# Patient Record
Sex: Female | Born: 1950 | Race: White | Hispanic: No | Marital: Married | Smoking: Former smoker
Health system: Southern US, Community
[De-identification: ages and names within clinical notes are randomized; demographics above are authoritative.]

## PROBLEM LIST (undated history)

## (undated) DIAGNOSIS — E785 Hyperlipidemia, unspecified: Secondary | ICD-10-CM

## (undated) DIAGNOSIS — J45909 Unspecified asthma, uncomplicated: Secondary | ICD-10-CM

## (undated) DIAGNOSIS — R635 Abnormal weight gain: Secondary | ICD-10-CM

## (undated) DIAGNOSIS — S72009A Fracture of unspecified part of neck of unspecified femur, initial encounter for closed fracture: Secondary | ICD-10-CM

## (undated) DIAGNOSIS — I10 Essential (primary) hypertension: Secondary | ICD-10-CM

## (undated) DIAGNOSIS — K289 Gastrojejunal ulcer, unspecified as acute or chronic, without hemorrhage or perforation: Secondary | ICD-10-CM

## (undated) DIAGNOSIS — Z8719 Personal history of other diseases of the digestive system: Secondary | ICD-10-CM

## (undated) DIAGNOSIS — M810 Age-related osteoporosis without current pathological fracture: Secondary | ICD-10-CM

## (undated) DIAGNOSIS — F419 Anxiety disorder, unspecified: Secondary | ICD-10-CM

## (undated) DIAGNOSIS — K219 Gastro-esophageal reflux disease without esophagitis: Secondary | ICD-10-CM

## (undated) DIAGNOSIS — D509 Iron deficiency anemia, unspecified: Secondary | ICD-10-CM

## (undated) DIAGNOSIS — J189 Pneumonia, unspecified organism: Secondary | ICD-10-CM

## (undated) DIAGNOSIS — R51 Headache: Secondary | ICD-10-CM

## (undated) DIAGNOSIS — Z8679 Personal history of other diseases of the circulatory system: Secondary | ICD-10-CM

## (undated) DIAGNOSIS — H269 Unspecified cataract: Secondary | ICD-10-CM

## (undated) DIAGNOSIS — R011 Cardiac murmur, unspecified: Secondary | ICD-10-CM

## (undated) DIAGNOSIS — Z86010 Personal history of colonic polyps: Secondary | ICD-10-CM

## (undated) DIAGNOSIS — F329 Major depressive disorder, single episode, unspecified: Secondary | ICD-10-CM

## (undated) DIAGNOSIS — K449 Diaphragmatic hernia without obstruction or gangrene: Secondary | ICD-10-CM

## (undated) HISTORY — PX: COLONOSCOPY: SHX174

## (undated) HISTORY — DX: Pneumonia, unspecified organism: J18.9

## (undated) HISTORY — DX: Hyperlipidemia, unspecified: E78.5

## (undated) HISTORY — PX: EYE SURGERY: SHX253

## (undated) HISTORY — PX: FRACTURE SURGERY: SHX138

## (undated) HISTORY — DX: Iron deficiency anemia, unspecified: D50.9

## (undated) HISTORY — DX: Personal history of other diseases of the circulatory system: Z86.79

## (undated) HISTORY — DX: Personal history of colonic polyps: Z86.010

## (undated) HISTORY — DX: Fracture of unspecified part of neck of unspecified femur, initial encounter for closed fracture: S72.009A

## (undated) HISTORY — DX: Essential (primary) hypertension: I10

## (undated) HISTORY — PX: BACK SURGERY: SHX140

## (undated) HISTORY — PX: APPENDECTOMY: SHX54

## (undated) HISTORY — DX: Age-related osteoporosis without current pathological fracture: M81.0

## (undated) HISTORY — PX: SMALL INTESTINE SURGERY: SHX150

## (undated) HISTORY — PX: POLYPECTOMY: SHX149

## (undated) HISTORY — PX: SPINE SURGERY: SHX786

## (undated) HISTORY — DX: Headache: R51

## (undated) HISTORY — DX: Major depressive disorder, single episode, unspecified: F32.9

## (undated) HISTORY — DX: Diaphragmatic hernia without obstruction or gangrene: K44.9

## (undated) HISTORY — DX: Unspecified asthma, uncomplicated: J45.909

## (undated) HISTORY — DX: Unspecified cataract: H26.9

## (undated) HISTORY — DX: Gastrojejunal ulcer, unspecified as acute or chronic, without hemorrhage or perforation: K28.9

## (undated) HISTORY — DX: Anxiety disorder, unspecified: F41.9

## (undated) HISTORY — PX: AUGMENTATION MAMMAPLASTY: SUR837

## (undated) HISTORY — DX: Cardiac murmur, unspecified: R01.1

## (undated) HISTORY — PX: NECK SURGERY: SHX720

## (undated) HISTORY — DX: Abnormal weight gain: R63.5

---

## 1998-02-20 ENCOUNTER — Other Ambulatory Visit: Admission: RE | Admit: 1998-02-20 | Discharge: 1998-02-20 | Payer: Self-pay | Admitting: Gynecology

## 2001-03-09 ENCOUNTER — Encounter: Payer: Self-pay | Admitting: *Deleted

## 2001-03-09 ENCOUNTER — Encounter: Admission: RE | Admit: 2001-03-09 | Discharge: 2001-03-09 | Payer: Self-pay | Admitting: *Deleted

## 2001-03-23 ENCOUNTER — Encounter: Payer: Self-pay | Admitting: *Deleted

## 2001-03-23 ENCOUNTER — Encounter: Admission: RE | Admit: 2001-03-23 | Discharge: 2001-03-23 | Payer: Self-pay | Admitting: *Deleted

## 2001-05-04 HISTORY — PX: BACK SURGERY: SHX140

## 2001-05-25 ENCOUNTER — Encounter: Admission: RE | Admit: 2001-05-25 | Discharge: 2001-05-25 | Payer: Self-pay | Admitting: *Deleted

## 2001-05-25 ENCOUNTER — Encounter: Payer: Self-pay | Admitting: *Deleted

## 2001-06-04 HISTORY — PX: OTHER SURGICAL HISTORY: SHX169

## 2001-06-16 ENCOUNTER — Other Ambulatory Visit: Admission: RE | Admit: 2001-06-16 | Discharge: 2001-06-16 | Payer: Self-pay | Admitting: Gynecology

## 2001-06-17 ENCOUNTER — Encounter: Payer: Self-pay | Admitting: Emergency Medicine

## 2001-06-17 ENCOUNTER — Inpatient Hospital Stay (HOSPITAL_COMMUNITY): Admission: EM | Admit: 2001-06-17 | Discharge: 2001-06-21 | Payer: Self-pay | Admitting: Emergency Medicine

## 2001-06-17 ENCOUNTER — Encounter: Payer: Self-pay | Admitting: Orthopaedic Surgery

## 2001-06-20 ENCOUNTER — Encounter: Payer: Self-pay | Admitting: Orthopaedic Surgery

## 2001-08-29 ENCOUNTER — Encounter: Payer: Self-pay | Admitting: Neurosurgery

## 2001-08-31 ENCOUNTER — Encounter: Payer: Self-pay | Admitting: Neurosurgery

## 2001-08-31 ENCOUNTER — Inpatient Hospital Stay (HOSPITAL_COMMUNITY): Admission: RE | Admit: 2001-08-31 | Discharge: 2001-09-02 | Payer: Self-pay | Admitting: Neurosurgery

## 2002-06-26 ENCOUNTER — Other Ambulatory Visit: Admission: RE | Admit: 2002-06-26 | Discharge: 2002-06-26 | Payer: Self-pay | Admitting: Gynecology

## 2002-08-02 ENCOUNTER — Ambulatory Visit (HOSPITAL_COMMUNITY): Admission: RE | Admit: 2002-08-02 | Discharge: 2002-08-02 | Payer: Self-pay | Admitting: *Deleted

## 2002-08-08 ENCOUNTER — Encounter: Payer: Self-pay | Admitting: Neurosurgery

## 2002-08-08 ENCOUNTER — Inpatient Hospital Stay (HOSPITAL_COMMUNITY): Admission: RE | Admit: 2002-08-08 | Discharge: 2002-08-09 | Payer: Self-pay | Admitting: Neurosurgery

## 2002-10-30 ENCOUNTER — Encounter: Payer: Self-pay | Admitting: Gynecology

## 2002-10-30 ENCOUNTER — Encounter: Admission: RE | Admit: 2002-10-30 | Discharge: 2002-10-30 | Payer: Self-pay | Admitting: Gynecology

## 2003-06-28 ENCOUNTER — Other Ambulatory Visit: Admission: RE | Admit: 2003-06-28 | Discharge: 2003-06-28 | Payer: Self-pay | Admitting: Gynecology

## 2004-05-20 ENCOUNTER — Ambulatory Visit (HOSPITAL_COMMUNITY): Admission: RE | Admit: 2004-05-20 | Discharge: 2004-05-20 | Payer: Self-pay | Admitting: Gynecology

## 2004-06-30 ENCOUNTER — Other Ambulatory Visit: Admission: RE | Admit: 2004-06-30 | Discharge: 2004-06-30 | Payer: Self-pay | Admitting: Gynecology

## 2004-07-28 ENCOUNTER — Ambulatory Visit (HOSPITAL_COMMUNITY): Admission: RE | Admit: 2004-07-28 | Discharge: 2004-07-28 | Payer: Self-pay | Admitting: Gynecology

## 2004-07-28 ENCOUNTER — Encounter (INDEPENDENT_AMBULATORY_CARE_PROVIDER_SITE_OTHER): Payer: Self-pay | Admitting: *Deleted

## 2004-07-28 ENCOUNTER — Ambulatory Visit (HOSPITAL_BASED_OUTPATIENT_CLINIC_OR_DEPARTMENT_OTHER): Admission: RE | Admit: 2004-07-28 | Discharge: 2004-07-28 | Payer: Self-pay | Admitting: Gynecology

## 2005-02-11 ENCOUNTER — Encounter: Admission: RE | Admit: 2005-02-11 | Discharge: 2005-02-11 | Payer: Self-pay | Admitting: Internal Medicine

## 2005-06-26 ENCOUNTER — Ambulatory Visit (HOSPITAL_COMMUNITY): Admission: RE | Admit: 2005-06-26 | Discharge: 2005-06-26 | Payer: Self-pay | Admitting: Gynecology

## 2005-07-01 ENCOUNTER — Other Ambulatory Visit: Admission: RE | Admit: 2005-07-01 | Discharge: 2005-07-01 | Payer: Self-pay | Admitting: Gynecology

## 2006-07-05 ENCOUNTER — Ambulatory Visit (HOSPITAL_COMMUNITY): Admission: RE | Admit: 2006-07-05 | Discharge: 2006-07-05 | Payer: Self-pay | Admitting: Sports Medicine

## 2006-07-06 ENCOUNTER — Other Ambulatory Visit: Admission: RE | Admit: 2006-07-06 | Discharge: 2006-07-06 | Payer: Self-pay | Admitting: Gynecology

## 2006-11-02 ENCOUNTER — Encounter: Admission: RE | Admit: 2006-11-02 | Discharge: 2006-11-02 | Payer: Self-pay | Admitting: Neurosurgery

## 2006-12-01 ENCOUNTER — Ambulatory Visit (HOSPITAL_COMMUNITY): Admission: RE | Admit: 2006-12-01 | Discharge: 2006-12-03 | Payer: Self-pay | Admitting: Neurosurgery

## 2007-07-07 ENCOUNTER — Ambulatory Visit (HOSPITAL_COMMUNITY): Admission: RE | Admit: 2007-07-07 | Discharge: 2007-07-07 | Payer: Self-pay | Admitting: Gynecology

## 2007-07-15 ENCOUNTER — Encounter: Admission: RE | Admit: 2007-07-15 | Discharge: 2007-07-15 | Payer: Self-pay | Admitting: Gynecology

## 2008-07-11 ENCOUNTER — Ambulatory Visit (HOSPITAL_COMMUNITY): Admission: RE | Admit: 2008-07-11 | Discharge: 2008-07-11 | Payer: Self-pay | Admitting: Gynecology

## 2008-07-13 ENCOUNTER — Encounter: Payer: Self-pay | Admitting: Internal Medicine

## 2008-07-13 LAB — CONVERTED CEMR LAB
AST: 20 units/L
Basophils Relative: 0 %
CO2: 21 meq/L
Chloride: 102 meq/L
Cholesterol: 164 mg/dL
Eosinophils Relative: 3 %
Glucose, Bld: 88 mg/dL
Hemoglobin: 12.3 g/dL
Lymphocytes, automated: 27 %
Monocytes Relative: 7 %
Neutrophils Relative %: 63 %
RBC: 3.8 M/uL
Total Protein: 6.9 g/dL
WBC: 7.3 10*3/uL

## 2009-05-04 HISTORY — PX: COLONOSCOPY: SHX174

## 2009-07-02 LAB — CONVERTED CEMR LAB

## 2009-07-12 ENCOUNTER — Ambulatory Visit (HOSPITAL_COMMUNITY): Admission: RE | Admit: 2009-07-12 | Discharge: 2009-07-12 | Payer: Self-pay | Admitting: Gynecology

## 2009-07-12 LAB — HM MAMMOGRAPHY: HM Mammogram: NORMAL

## 2009-07-15 ENCOUNTER — Encounter: Payer: Self-pay | Admitting: Internal Medicine

## 2009-07-15 LAB — CONVERTED CEMR LAB
BUN: 16 mg/dL
CO2: 22 meq/L
Creatinine, Ser: 0.71 mg/dL
Eosinophils Relative: 3 %
Glucose, Bld: 94 mg/dL
HCT: 31.5 %
Hemoglobin: 31.5 g/dL
Hgb A1c MFr Bld: 5.7 %
Lymphocytes, automated: 22 %
MCV: 96.6 fL
Neutrophils Relative %: 66 %
RBC: 3.26 M/uL
Sodium: 140 meq/L
Total Protein: 6.5 g/dL

## 2009-10-02 HISTORY — PX: OTHER SURGICAL HISTORY: SHX169

## 2009-10-04 ENCOUNTER — Inpatient Hospital Stay (HOSPITAL_COMMUNITY): Admission: EM | Admit: 2009-10-04 | Discharge: 2009-10-07 | Payer: Self-pay | Admitting: Emergency Medicine

## 2009-10-04 ENCOUNTER — Encounter: Payer: Self-pay | Admitting: Internal Medicine

## 2009-10-04 LAB — CONVERTED CEMR LAB
BUN: 35 mg/dL
Basophils Relative: 1 %
Chloride: 103 meq/L
Creatinine, Ser: 0.8 mg/dL
Hemoglobin: 6.6 g/dL
Lymphocytes, automated: 19 %
Monocytes Relative: 11 %
RBC: 2.31 M/uL
Sodium: 137 meq/L
WBC: 7.4 10*3/uL

## 2009-10-05 ENCOUNTER — Ambulatory Visit: Payer: Self-pay | Admitting: Gastroenterology

## 2009-10-05 ENCOUNTER — Encounter: Payer: Self-pay | Admitting: Internal Medicine

## 2009-10-06 ENCOUNTER — Encounter (INDEPENDENT_AMBULATORY_CARE_PROVIDER_SITE_OTHER): Payer: Self-pay | Admitting: Internal Medicine

## 2009-10-06 ENCOUNTER — Encounter: Payer: Self-pay | Admitting: Internal Medicine

## 2009-10-06 ENCOUNTER — Encounter: Payer: Self-pay | Admitting: Gastroenterology

## 2009-10-07 ENCOUNTER — Telehealth (INDEPENDENT_AMBULATORY_CARE_PROVIDER_SITE_OTHER): Payer: Self-pay | Admitting: *Deleted

## 2009-10-07 ENCOUNTER — Encounter (INDEPENDENT_AMBULATORY_CARE_PROVIDER_SITE_OTHER): Payer: Self-pay | Admitting: *Deleted

## 2009-10-08 DIAGNOSIS — K449 Diaphragmatic hernia without obstruction or gangrene: Secondary | ICD-10-CM | POA: Insufficient documentation

## 2009-10-08 HISTORY — DX: Diaphragmatic hernia without obstruction or gangrene: K44.9

## 2009-10-14 ENCOUNTER — Ambulatory Visit (HOSPITAL_COMMUNITY): Admission: RE | Admit: 2009-10-14 | Discharge: 2009-10-14 | Payer: Self-pay | Admitting: Gastroenterology

## 2009-11-06 ENCOUNTER — Encounter (INDEPENDENT_AMBULATORY_CARE_PROVIDER_SITE_OTHER): Payer: Self-pay | Admitting: *Deleted

## 2009-11-06 ENCOUNTER — Ambulatory Visit: Payer: Self-pay | Admitting: Gastroenterology

## 2009-11-06 DIAGNOSIS — K289 Gastrojejunal ulcer, unspecified as acute or chronic, without hemorrhage or perforation: Secondary | ICD-10-CM | POA: Insufficient documentation

## 2009-11-06 HISTORY — DX: Gastrojejunal ulcer, unspecified as acute or chronic, without hemorrhage or perforation: K28.9

## 2009-11-07 LAB — CONVERTED CEMR LAB
Basophils Absolute: 0 10*3/uL (ref 0.0–0.1)
Basophils Relative: 0.5 % (ref 0.0–3.0)
Eosinophils Relative: 3.6 % (ref 0.0–5.0)
Lymphocytes Relative: 23.7 % (ref 12.0–46.0)
MCHC: 33.3 g/dL (ref 30.0–36.0)
Monocytes Relative: 8.5 % (ref 3.0–12.0)
Neutrophils Relative %: 63.7 % (ref 43.0–77.0)
WBC: 7.7 10*3/uL (ref 4.5–10.5)

## 2009-11-14 ENCOUNTER — Encounter: Payer: Self-pay | Admitting: Internal Medicine

## 2009-11-14 DIAGNOSIS — M810 Age-related osteoporosis without current pathological fracture: Secondary | ICD-10-CM

## 2009-11-14 DIAGNOSIS — F329 Major depressive disorder, single episode, unspecified: Secondary | ICD-10-CM

## 2009-11-14 DIAGNOSIS — D509 Iron deficiency anemia, unspecified: Secondary | ICD-10-CM | POA: Insufficient documentation

## 2009-11-14 DIAGNOSIS — I10 Essential (primary) hypertension: Secondary | ICD-10-CM | POA: Insufficient documentation

## 2009-11-14 DIAGNOSIS — F3289 Other specified depressive episodes: Secondary | ICD-10-CM

## 2009-11-14 DIAGNOSIS — E785 Hyperlipidemia, unspecified: Secondary | ICD-10-CM

## 2009-11-14 DIAGNOSIS — S72009A Fracture of unspecified part of neck of unspecified femur, initial encounter for closed fracture: Secondary | ICD-10-CM

## 2009-11-14 DIAGNOSIS — M8580 Other specified disorders of bone density and structure, unspecified site: Secondary | ICD-10-CM | POA: Insufficient documentation

## 2009-11-14 DIAGNOSIS — F419 Anxiety disorder, unspecified: Secondary | ICD-10-CM

## 2009-11-14 HISTORY — DX: Other specified depressive episodes: F32.89

## 2009-11-14 HISTORY — DX: Fracture of unspecified part of neck of unspecified femur, initial encounter for closed fracture: S72.009A

## 2009-11-14 HISTORY — DX: Essential (primary) hypertension: I10

## 2009-11-14 HISTORY — DX: Age-related osteoporosis without current pathological fracture: M81.0

## 2009-11-14 HISTORY — DX: Hyperlipidemia, unspecified: E78.5

## 2009-11-14 HISTORY — DX: Iron deficiency anemia, unspecified: D50.9

## 2009-11-14 HISTORY — DX: Major depressive disorder, single episode, unspecified: F32.9

## 2009-11-15 ENCOUNTER — Ambulatory Visit: Payer: Self-pay | Admitting: Internal Medicine

## 2009-11-15 DIAGNOSIS — Z860100 Personal history of colon polyps, unspecified: Secondary | ICD-10-CM | POA: Insufficient documentation

## 2009-11-15 DIAGNOSIS — R51 Headache: Secondary | ICD-10-CM

## 2009-11-15 DIAGNOSIS — Z8679 Personal history of other diseases of the circulatory system: Secondary | ICD-10-CM | POA: Insufficient documentation

## 2009-11-15 DIAGNOSIS — R519 Headache, unspecified: Secondary | ICD-10-CM | POA: Insufficient documentation

## 2009-11-15 HISTORY — DX: Headache: R51

## 2009-11-15 HISTORY — DX: Personal history of colon polyps, unspecified: Z86.0100

## 2009-11-15 HISTORY — DX: Personal history of colonic polyps: Z86.010

## 2009-11-15 HISTORY — DX: Personal history of other diseases of the circulatory system: Z86.79

## 2009-11-18 ENCOUNTER — Ambulatory Visit (HOSPITAL_COMMUNITY): Admission: RE | Admit: 2009-11-18 | Discharge: 2009-11-18 | Payer: Self-pay | Admitting: Internal Medicine

## 2009-11-21 ENCOUNTER — Encounter: Payer: Self-pay | Admitting: Internal Medicine

## 2009-11-29 ENCOUNTER — Ambulatory Visit: Payer: Self-pay | Admitting: Internal Medicine

## 2009-12-11 ENCOUNTER — Ambulatory Visit: Payer: Self-pay | Admitting: Gastroenterology

## 2009-12-11 ENCOUNTER — Encounter (INDEPENDENT_AMBULATORY_CARE_PROVIDER_SITE_OTHER): Payer: Self-pay | Admitting: *Deleted

## 2009-12-12 ENCOUNTER — Telehealth: Payer: Self-pay | Admitting: Internal Medicine

## 2009-12-30 ENCOUNTER — Telehealth: Payer: Self-pay | Admitting: Internal Medicine

## 2010-01-10 ENCOUNTER — Ambulatory Visit: Payer: Self-pay | Admitting: Internal Medicine

## 2010-01-10 DIAGNOSIS — R635 Abnormal weight gain: Secondary | ICD-10-CM

## 2010-01-10 HISTORY — DX: Abnormal weight gain: R63.5

## 2010-01-10 LAB — CONVERTED CEMR LAB
Basophils Relative: 0.3 % (ref 0.0–3.0)
Eosinophils Relative: 3.8 % (ref 0.0–5.0)
Lymphocytes Relative: 26 % (ref 12.0–46.0)
Monocytes Absolute: 0.6 10*3/uL (ref 0.1–1.0)
Monocytes Relative: 8.6 % (ref 3.0–12.0)
Neutro Abs: 4.2 10*3/uL (ref 1.4–7.7)
Platelets: 189 10*3/uL (ref 150.0–400.0)
TSH: 0.93 microintl units/mL (ref 0.35–5.50)

## 2010-01-15 ENCOUNTER — Telehealth (INDEPENDENT_AMBULATORY_CARE_PROVIDER_SITE_OTHER): Payer: Self-pay | Admitting: *Deleted

## 2010-01-30 ENCOUNTER — Encounter: Payer: Self-pay | Admitting: Internal Medicine

## 2010-02-10 ENCOUNTER — Telehealth: Payer: Self-pay | Admitting: Internal Medicine

## 2010-03-07 ENCOUNTER — Encounter: Payer: Self-pay | Admitting: Internal Medicine

## 2010-05-24 ENCOUNTER — Encounter: Payer: Self-pay | Admitting: Gynecology

## 2010-06-03 NOTE — Progress Notes (Signed)
Summary: ALT med/VAL pt  Phone Note Call from Patient Call back at [facility] Phone [phone] Call back at Work Phone [phone]   Caller: Patient Summary of Call: Pt called stating that Topamax Rxd by VAL for migraine HA is not helping at all. Pt is requesting alternate medication. Initial call taken by: Margaret Pyle, CMA,  December 12, 2009 1:35 PM  Follow-up for Phone Call        there is no similar medication substitution close to the topamax itself;  I would feel uncomfortable prescribing a different class of med at this point;    please inform pt, and let her know I would ask Dr L to address when back in the office tomorrow Follow-up by: Corwin Levins MD,  December 12, 2009 2:28 PM  Additional Follow-up for Phone Call Additional follow up Details #1::        pt informed. Margaret Pyle, CMA  December 12, 2009 4:03 PM   will add amitriptyline to current meds (continue topamax 50mg /d and other meds) - titrate as instructed - ROV 2-3 weeks - Newt Lukes MD  December 12, 2009 5:04 PM     Additional Follow-up for Phone Call Additional follow up Details #2::    Pt states that Topamax has side effect also, depression and lethargy. Pt is now requesting to taken off Topamax and Rx's an alternative Follow-up by: Margaret Pyle, CMA,  December 13, 2009 8:12 AM  Additional Follow-up for Phone Call Additional follow up Details #3:: Details for Additional Follow-up Action Taken: stop topamax - if taking 25mg  two times a day, should reduce to 25 mg once daily for 7 days before stopping to wean off med) - start amitriptiline to help with sleep and anxiety (dose as per prior instructions) in addition to other meds and [facility] eval and tx of her headache Newt Lukes MD  December 13, 2009 8:23 AM   Pt informed and will expect call from [facility] with appt info Additional Follow-up by: Margaret Pyle, CMA,  December 13, 2009 9:37 AM  New/Updated Medications: TOPAMAX 25 MG TABS (TOPIRAMATE) 1 by mouth two times a day  (or as directed) AMITRIPTYLINE HCL 25 MG TABS (AMITRIPTYLINE HCL) 1/2 by mouth at bedtime x 7 days, then increase to 1 by mouth at bedtime (or as directed) Prescriptions: AMITRIPTYLINE HCL 25 MG TABS (AMITRIPTYLINE HCL) 1/2 by mouth at bedtime x 7 days, then increase to 1 by mouth at bedtime (or as directed)  #30 x 2   Entered and Authorized by:   Newt Lukes MD   Signed by:   Newt Lukes MD on 12/12/2009   Method used:   Electronically to        [facility]  [phone]* (retail)       [address] [place], [place]  [address]       Ph: [phone] or [phone]       Fax: [phone]   RxID:   [id number]

## 2010-06-03 NOTE — Progress Notes (Signed)
Summary: Referral/ Oxycodone  Phone Note Call from Patient Call back at Home Phone [phone] Call back at Work Phone [phone]   Caller: Patient Summary of Call: Pt called requesting refill of Oxycodone and referral to Dr Trey Sailors for HA eval? Initial call taken by: Margaret Pyle, CMA,  February 10, 2010 1:35 PM  Follow-up for Phone Call        neuro (headache specialist) consult recommended against use of narcotic (oxy) as this can make headache worse over time - i will give only #30 and ask pt to use them sparingly - referral to dr. 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 Rx in cabinet for pt pickup. Additional Follow-up by: Margaret Pyle, CMA,  February 10, 2010 1:58 PM    Additional Follow-up for Phone Call Additional follow up Details #2::    noted - thanks Follow-up by: Newt Lukes MD,  February 10, 2010 2:16 PM  Prescriptions: OXYCODONE-ACETAMINOPHEN 5-325 MG TABS (OXYCODONE-ACETAMINOPHEN) 1-2 by mouth every 6 hours as needed for severe pain  #30 x 0   Entered and Authorized by:   Newt Lukes MD   Signed by:   Newt Lukes MD on 02/10/2010   Method used:   Print then Give to Patient   RxID:   [id number]

## 2010-06-03 NOTE — Assessment & Plan Note (Signed)
Summary: 2-3 wk fu---stc   Vital Signs:  Patient profile:   60 year old female Height:      62 inches (157.48 cm) Weight:      144 pounds (65.45 kg) O2 Sat:      97 % on Room air Temp:     98.6 degrees F (37.00 degrees C) oral Pulse rate:   94 / minute BP sitting:   110 / 62  (left arm) Cuff size:   regular  Vitals Entered By: Orlan Leavens RMA (November 29, 2009 10:12 AM)  O2 Flow:  Room air CC: 3 week follow-up Is Patient Diabetic? No Pain Assessment Patient in pain? no        Primary Care Provider:  Rene Paci, MD  CC:  3 week follow-up.  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  The symptoms began >1 year ago.  On a scale of 1 to 10, the intensity is described as a 5.  near daily symptoms - occurs more days than not each week, sometimes daily. present since teenage years, maybe longer - prev tx self with  OTC advil 6/d but recent hosp related to NSAID induced ulcer/gastrtis and assoc iron-def anemia. since off advil, HA symptoms worse. good relief of HA in hosp with percocet, prior tx with tylenol and trmadol ineffective.  The patient denies nausea, vomiting, sweats, tearing of eyes, nasal congestion, sinus pain, sinus pressure, photophobia, and phonophobia.  The headache is described as intermittent and band-like.  The location of the pain is bitemporal.  High-risk features (red flags) include pain worse with exertion and age >50 years.  The patient denies the following high-risk features: fever, neck pain/stiffness, vision loss or change, focal weakness, altered mental status, rash, trauma, new type of headache, immunosuppression, concomitant infection, and  anticoagulation use.  The headaches are precipitated by stress.  Prior treatment has included a NSAID and a narcotic.    Clinical Review Panels:  Lipid Management   Cholesterol:  165 (07/15/2009)   LDL (bad choesterol):  82 (07/15/2009)   HDL (good cholesterol):  72 (07/15/2009)   Triglycerides:  58 (07/15/2009)  Diabetes Management   HgBA1C:  5.7 (07/15/2009)   Creatinine:  0.80 (10/04/2009)  CBC   WBC:  7.7 (11/06/2009)   RBC:  3.43 (11/06/2009)   Hgb:  10.4 (11/06/2009)   Hct:  31.3 (11/06/2009)   Platelets:  210.0 (11/06/2009)   MCV  91.2 (11/06/2009)   MCHC  33.3 (11/06/2009)   RDW  17.8 (11/06/2009)   PMN:  63.7 (11/06/2009)   Lymphs:  23.7 (11/06/2009)   Monos:  8.5 (11/06/2009)   Eosinophils:  3.6 (11/06/2009)   Basophil:  0.5 (11/06/2009)  Complete Metabolic Panel   Glucose:  103 (10/04/2009)   Sodium:  137 (10/04/2009)   Potassium:  3.9 (10/04/2009)   Chloride:  103 (10/04/2009)   CO2:  28 (10/04/2009)   BUN:  35 (10/04/2009)   Creatinine:  0.80 (10/04/2009)   Albumin:  4.4 (07/15/2009)   Total Protein:  6.5 (07/15/2009)   Calcium:  9.4 (07/15/2009)   Total Bili:  0.2 (07/15/2009)   Alk Phos:  56 (07/15/2009)  SGPT (ALT):  19 (07/15/2009)   SGOT (AST):  24 (07/15/2009)   Current Medications (verified): 1)  Nexium 40 Mg Cpdr (Esomeprazole Magnesium) .... Take One Pill Once A Day 2)  Lisinopril 10 Mg Tabs (Lisinopril) .... Take 1 By Mouth Once Daily 3)  Ferrous Sulfate 325 (65 Fe) Mg Tabs (Ferrous Sulfate) .... Once Daily 4)  Caltrate 600+d 600-400 Mg-Unit Tabs (Calcium Carbonate-Vitamin D) .... Once Daily 5)  Crestor 10 Mg Tabs (Rosuvastatin Calcium) .... Take 1/2 Tablet Daily 6)  Boniva 150 Mg Tabs (Ibandronate Sodium) .... Once Monthly 7)  Vivelle-Dot 0.05 Mg/24hr Pttw (Estradiol) .... Use 1/2 of Patch As Dircted 8)  Trazodone Hcl 50 Mg Tabs (Trazodone Hcl) .... Take 1 At Bedtime 9)  Cymbalta 60 Mg Cpep (Duloxetine Hcl) .... Take 1 By Mouth Once  Daily 10)  Metformin Hcl 500 Mg Tabs (Metformin Hcl) .... Take 3 By Mouth Once Daily 11)  Ultracet 37.5-325 Mg Tabs (Tramadol-Acetaminophen) .Marland Kitchen.. 1-2 By Mouth Every 6 Hours As Needed For Moderate Pain 12)  Oxycodone-Acetaminophen 5-325 Mg Tabs (Oxycodone-Acetaminophen) .Marland Kitchen.. 1-2 By Mouth Every 6 Hours As Needed For Severe Pain  Allergies (verified): No Known Drug Allergies  Past History:  Past Medical History: Anemia-iron deficiency Depression  Hyperlipidemia Hypertension  Osteoporosis Colonic polyps, hx of glucose intolerance:  a1c 5.7 07/2009  MD roster: gyn - lomax GI - jacobs ortho - daldorf Nsurg - roy  Review of Systems  The patient denies vision loss, chest pain, abdominal pain, suspicious skin lesions, and enlarged lymph nodes.    Physical Exam  General:  alert, well-developed, well-nourished, and cooperative to examination.    Neurologic:  alert & oriented X3 and cranial nerves II-XII symetrically intact.  strength normal in all extremities, sensation intact to light touch, and gait normal. speech fluent without dysarthria or aphasia; follows commands with good comprehension.  Psych:  Oriented X3, memory intact for recent and remote, normally interactive, very stoic, good eye contact, not anxious appearing, not depressed appearing, and not agitated.      Impression & Recommendations:  Problem # 1:  HEADACHE (ICD-784.0)  The following medications were removed from the medication list:    Ultracet 37.5-325 Mg Tabs (Tramadol-acetaminophen) .Marland Kitchen... 1-2 by mouth every 6 hours as needed for moderate pain Her updated medication list for this problem includes:    Oxycodone-acetaminophen 5-325 Mg Tabs (Oxycodone-acetaminophen) .Marland Kitchen... 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 consider refer for injections if med tx ineffective, ?role for biofeedback given tremedous stress induced component (worse since assuming care of mom 03/2009) hosp 10/2009 related to GIB reviewed including tests - continue breakthru efforts to control pain with percocet if severe until topamax titration achieved- new rx provided  Orders: Prescription Created Electronically [phone])  Complete Medication List: 1)  Nexium 40 Mg Cpdr (Esomeprazole magnesium) .... Take one pill once a day 2)  Lisinopril 10 Mg Tabs (Lisinopril) .... Take 1 by mouth once daily 3)  Ferrous Sulfate 325 (65 Fe) Mg Tabs (Ferrous  sulfate) .... Once daily 4)  Caltrate 600+d 600-400 Mg-unit Tabs (Calcium carbonate-vitamin d) .... Once daily 5)  Crestor 10 Mg Tabs (Rosuvastatin calcium) .... Take 1/2 tablet daily 6)  Boniva 150 Mg Tabs (Ibandronate sodium) .... Once monthly 7)  Vivelle-dot 0.05 Mg/24hr Pttw (Estradiol) .... Use 1/2 of patch as dircted 8)  Trazodone Hcl 50 Mg Tabs (Trazodone hcl) .... Take 1 at bedtime 9)  Cymbalta 60 Mg Cpep (Duloxetine hcl) .... Take 1 by mouth once daily 10)  Metformin Hcl 500 Mg Tabs (Metformin hcl) .... Take 3 by mouth once daily 11)  Oxycodone-acetaminophen 5-325 Mg Tabs (Oxycodone-acetaminophen) .Marland Kitchen.. 1-2 by mouth every 6 hours as needed for severe pain 12)  Topamax 25 Mg Tabs (Topiramate) .Marland Kitchen.. 1 by mouth at bedtime x 7 days, then increase to 1 by mouth two times a day  (or as directed)  Patient Instructions: 1)  it was good to see you today.  2)  MRI brain results reviewed - small pitutary cyst present but this is not the cause of your headache symptoms and we will not plan  other evaluation of this finding at this time (benign finding) 3)  start Topamax for headache symptoms: your prescriptions have been electronically submitted to your pharmacy. Please take as directed. Contact our office if you believe you're having problems with the medication(s).  4)  Please schedule a follow-up appointment in 4-6 weeks to review symptoms and medications, call sooner if problems.  Prescriptions: OXYCODONE-ACETAMINOPHEN 5-325 MG TABS (OXYCODONE-ACETAMINOPHEN) 1-2 by mouth every 6 hours as needed for severe pain  #30 x 0   Entered and Authorized by:   Newt Lukes MD   Signed by:   Newt Lukes MD on 11/29/2009   Method used:   Print then Give to Patient   RxID:   [id number] TOPAMAX 25 MG TABS (TOPIRAMATE) 1 by mouth at bedtime x 7 days, then increase to 1 by mouth two times a day  (or as directed)  #60 x 1   Entered and Authorized by:   Newt Lukes MD   Signed by:   Newt Lukes MD on 11/29/2009   Method used:   [facility] to        [facility]  [phone][facility] (retail)       [address] [place], [place]  [address]       Ph: [phone] or [phone]       Fax: [phone]   RxID:   [id number]

## 2010-06-03 NOTE — Letter (Signed)
Summary: Appt Reminder 2  [facility]  [address] [place], [place] [address]   Phone: [phone]  Fax: [phone]        December 11, 2009 MRN: [id number]    [facility] [address] [place], [place]  [address]    Dear [name],   You have a return appointment with Dr. Christella Hartigan on 01/15/10 at 3:45pm. Please arrive at our [facility] lab on the basement level for labs at 1:45 pm before your appointment. Please remember to bring a complete list of the medicines you are taking, your insurance card and your co-pay.  If you have to cancel or reschedule this appointment, please call before 5:00 pm the evening before to avoid a cancellation fee.  If you have any questions or concerns, please call [phone].    Sincerely,    Chales Abrahams CMA (AAMA)  Appended Document: Appt Reminder 2 letter mailed

## 2010-06-03 NOTE — Procedures (Signed)
Summary: Upper Endoscopy  Patient: Talana Lysne Note: All result statuses are Final unless otherwise noted.  Tests: (1) Upper Endoscopy (EGD)   EGD Upper Endoscopy       DONE     [facility]     [address]     [place], [place]  [address]           ENDOSCOPY PROCEDURE REPORT           PATIENT:  Jerelyn, Trimarco  MR#:  [id number]     BIRTHDATE:  Sep 08, 1950, 59 yrs. old  GENDER:  female     ENDOSCOPIST:  Rachael Fee, MD     PROCEDURE DATE:  12/11/2009     PROCEDURE:  EGD, diagnostic     ASA CLASS:  Class II     INDICATIONS:  previous gastric ulcers (NSAID related +/- Cameron     erosions related to fairly large HH)     MEDICATIONS:  Fentanyl 50 mcg IV, Versed 7 mg IV     TOPICAL ANESTHETIC:  none           DESCRIPTION OF PROCEDURE:   After the risks benefits and     alternatives of the procedure were thoroughly explained, informed     consent was obtained.  The [facility] [id number] endoscope was     introduced through the mouth and advanced to the second portion of     the duodenum, without limitations.  The instrument was slowly     withdrawn as the mucosa was fully examined.           <<PROCEDUREIMAGES>>           There was a medium to large hiatal hernia. There were several     thick, linear erosions along the lip of the hernia. The previous,     deeper proximal gastric ulcer has resolved. The previously noted     multiple distal gastric ulcers have resolved. There was a small     amount of retained solid food in stomach (see image1, image2,     image3, image7, and image6).  Otherwise the examination was normal     (see image5 and image4).    Retroflexed views revealed no     abnormalities.    The scope was then withdrawn from the patient     and the procedure completed.           COMPLICATIONS:  None           ENDOSCOPIC IMPRESSION:     1) Moderate to large hiatal hernia with several Cameron's     erosions associated     2) The previously noted  proximal and distal gastric ulcers have     resolved (she stopped NSAIDs)     2) Otherwise normal examination           RECOMMENDATIONS:     Dr. Christella Hartigan' office will arrange return appointment in 4-5 weeks,     CBC a day or to prior.     Will need to determine if she will need surgical referral to     repair the hiatal hernia (for chronic anemia or for symptoms).     Stay on once daily iron supplement.     Can back down to once daily nexium (20-30 min prior to breakfast     or dinner meals).           ______________________________     Rachael Fee, MD  n.     eSIGNED:   Rachael Fee at 12/11/2009 10:35 AM           Ladell Heads, [id number]  Note: An exclamation mark (!) indicates a result that was not dispersed into the flowsheet. Document Creation Date: 12/11/2009 10:36 AM _______________________________________________________________________  (1) Order result status: Final Collection or observation date-time: 12/11/2009 10:25 Requested date-time:  Receipt date-time:  Reported date-time:  Referring Physician:   Ordering Physician: Rob Bunting [phone]) Specimen Source:  Source: Launa Grill Order Number: [id number] Lab site:   Appended Document: Upper Endoscopy ROV scheduled and labs in IDX

## 2010-06-03 NOTE — Assessment & Plan Note (Signed)
Summary: NEW [employer] PT-PKG--#--STC   Vital Signs:  Patient profile:   59 year old female Height:      62 inches (157.48 cm) Weight:      144.8 pounds (65.82 kg) O2 Sat:      98 % on Room air Temp:     98.6 degrees F (37.00 degrees C) oral Pulse rate:   82 / minute BP sitting:   98 / 72  (left arm) Cuff size:   regular  Vitals Entered By: Orlan Leavens (November 15, 2009 11:25 AM)  O2 Flow:  Room air CC: New patient, Hypertension Management, Headaches Is Patient Diabetic? No Pain Assessment Patient in pain? no        Primary Care Provider:  Rene Paci, MD  CC:  New patient, Hypertension Management, and Headaches.  History of Present Illness: new pt to me, here to est care -  has followed with dr. c lomax for all med needs until now - will cont to follow there for gyn needs  Headaches      This is a 60 year old woman who presents with Headaches.  The symptoms began >1 year ago.  On a scale of 1 to 10, the intensity is described as a 5.  near daily symptoms - occurs more days than not each week, sometimes daily. present since teenage years, maybe longer - prev tx self with  OTC advil 6/d but recent hosp related to NSAID induced ulcer/gastrtis and assoc iron-def anemia. since off advil, HA symptoms worse. good relief of HA in hosp with percocet, prior tx with tylenol and trmadol ineffective.  The patient denies nausea, vomiting, sweats, tearing of eyes, nasal congestion, sinus pain, sinus pressure, photophobia, and phonophobia.  The headache is described as intermittent and band-like.  The location of the pain is bitemporal.  High-risk features (red flags) include pain worse with exertion and age >50 years.  The patient denies the following high-risk features: fever, neck pain/stiffness, vision loss or change, focal weakness, altered mental status, rash, trauma, new type of headache, immunosuppression, concomitant infection, and anticoagulation use.  The headaches are precipitated by  stress.  Prior treatment has included a NSAID and a narcotic.    Hypertension History:      Positive major cardiovascular risk factors include female age 31 years old or older, hyperlipidemia, and hypertension.  Negative major cardiovascular risk factors include non-tobacco-user status.     Preventive Screening-Counseling & Management  Alcohol-Tobacco     Alcohol drinks/day: <1     Alcohol Counseling: not indicated; patient does not drink     Smoking Status: quit  Caffeine-Diet-Exercise     Caffeine use/day: 0     Caffeine Counseling: not indicated; caffeine use is not excessive or problematic     Does Patient Exercise: yes     Exercise Counseling: not indicated; exercise is adequate     Depression Counseling: not indicated; screening negative for depression  Safety-Violence-Falls     Seat Belt Counseling: not indicated; patient wears seat belts     Helmet Counseling: not indicated; patient wears helmet when riding bicycle/motocycle     Firearms in the Home: no firearms in the home     Firearm Counseling: not applicable     Smoke Detectors: yes     Violence in the Home: no risk noted     Fall Risk Counseling: not indicated; no significant falls noted  Clinical Review Panels:  Prevention   Last Mammogram:  No specific mammographic evidence of  malignancy.  Assessment: BIRADS 1. (07/12/2009)   Last Pap Smear:  Interpretation Result:Negative for intraepithelial Lesion or Malignancy.    (07/02/2009)   Last Colonoscopy:  Location:  [facility].  Impression: Showed 2 small polyps both removed, one was retrieved. The polyp that was not retrieved is clearly adenomatous, rectal polypectomy. Mild diverticulosis in the sigmoid colon to descending colon segment. (10/06/2009)  CBC   WBC:  7.7 (11/06/2009)   RBC:  3.43 (11/06/2009)   Hgb:  10.4 (11/06/2009)   Hct:  31.3 (11/06/2009)   Platelets:  210.0 (11/06/2009)   MCV  91.2 (11/06/2009)   MCHC  33.3 (11/06/2009)   RDW   17.8 (11/06/2009)   PMN:  63.7 (11/06/2009)   Lymphs:  23.7 (11/06/2009)   Monos:  8.5 (11/06/2009)   Eosinophils:  3.6 (11/06/2009)   Basophil:  0.5 (11/06/2009)  Complete Metabolic Panel   Glucose:  103 (10/04/2009)   Sodium:  137 (10/04/2009)   Potassium:  3.9 (10/04/2009)   Chloride:  103 (10/04/2009)   CO2:  28 (10/04/2009)   BUN:  35 (10/04/2009)   Creatinine:  0.80 (10/04/2009)   Current Medications (verified): 1)  Nexium 40 Mg Cpdr (Esomeprazole Magnesium) .... Take One Pill Once A Day 2)  Lisinopril 10 Mg Tabs (Lisinopril) .... Take 1 By Mouth Once Daily 3)  Ferrous Sulfate 325 (65 Fe) Mg Tabs (Ferrous Sulfate) .... Once Daily 4)  Caltrate 600+d 600-400 Mg-Unit Tabs (Calcium Carbonate-Vitamin D) .... Once Daily 5)  Crestor 10 Mg Tabs (Rosuvastatin Calcium) .... Take 1/2 Tablet Daily 6)  Boniva 150 Mg Tabs (Ibandronate Sodium) .... Once Monthly 7)  Vivelle-Dot 0.05 Mg/24hr Pttw (Estradiol) .... Use 1/2 of Patch As Dircted 8)  Trazodone Hcl 50 Mg Tabs (Trazodone Hcl) .... Take 1 At Bedtime 9)  Cymbalta 60 Mg Cpep (Duloxetine Hcl) .... Take 1 By Mouth Once Daily 10)  Metformin Hcl 500 Mg Tabs (Metformin Hcl) .... 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 I have reviewed all other systems and they were negative.   Physical Exam  General:  alert, well-developed, well-nourished, and cooperative to examination.    Head:  Normocephalic and atraumatic without obvious abnormalities. No apparent alopecia or balding. no sinus pain to pressure/palp Eyes:  vision grossly intact; pupils equal, round and reactive to light.  conjunctiva and lids normal.    Ears:  normal pinnae bilaterally, without erythema, swelling, or tenderness to palpation. L TM clear, without effusion, or cerumen impaction.R TM with mod cerumen obstrustion but no inflammation signs. Hearing grossly normal bilaterally  Mouth:  teeth and gums in good repair; mucous membranes moist, without lesions or ulcers. oropharynx clear without exudate, no erythema.  Neck:  supple, full ROM,  no masses, no thyromegaly; no thyroid nodules or tenderness. no JVD or carotid bruits.   Lungs:  normal respiratory effort, no intercostal retractions or use of accessory muscles; normal breath sounds bilaterally - no crackles and no wheezes.    Heart:  normal rate, regular rhythm, no murmur, and no rub. BLE without edema. normal DP pulses and normal cap refill in all 4 extremities    Abdomen:  soft, non-tender, normal bowel sounds, no distention; no masses and no appreciable hepatomegaly or splenomegaly.   Genitalia:  defer gyn Msk:  No deformity or scoliosis noted of thoracic or lumbar spine.   Neurologic:  alert & oriented X3 and cranial nerves II-XII symetrically intact.  strength normal in all extremities, sensation intact to light touch, and gait normal. speech fluent without dysarthria or aphasia; follows commands with good comprehension.  Skin:  no rashes, vesicles, ulcers, or erythema. No nodules or irregularity to palpation.  Psych:  Oriented X3, memory intact for recent and remote, normally interactive, good eye contact, not anxious  appearing, not depressed appearing, and not agitated.      Impression & Recommendations:  Problem # 1:  HEADACHE (ICD-784.0) chronic symptoms - tension type by hx and exam - arrange for MRI brain as never done - already on SNRI >4 yr w/o improvement in pain symptoms - consider elavil 10 or topamax 25 if imaging normal -  send for prior labs to review and exclude other med illness causing symptoms - pt with recent complications related to NSAID overuse (GIB, anemia) and will not use same again -note rebound likely complicating current pain. consider refer for injections if med tx ineffective, ?role for biofeedback given tremedous stress induced component (worse since assuming care of mom 03/2009) hosp 10/2009 related to GIB reviewed including tests - for now, control pain with ultracet or percocet if severe until imagaing complete - rx provided Time spent with patient 45 minutes, more than 50% of this time was spent counseling patient on chronic headaches, past tx, recent hosp and need for prior labs/records Her updated medication list for this problem includes:    Ultracet 37.5-325 Mg Tabs (Tramadol-acetaminophen) .Marland Kitchen... 1-2 by mouth every 6 hours as needed for moderate pain    Oxycodone-acetaminophen 5-325 Mg Tabs (Oxycodone-acetaminophen) .Marland Kitchen... 1-2 by mouth every 6 hours as needed for severe pain  Orders: Misc. Referral (Misc. Ref)  Problem # 2:  DEPRESSION (ICD-311) prev trial wellbutrin, pristique and prozac ineffective - on current tx >4y and symptoms well controlled The following medications were removed from the medication list:    Cymbalta 20 Mg Cpep (Duloxetine hcl) ..... Once daily Her updated medication list for this problem includes:    Trazodone Hcl 50 Mg Tabs (Trazodone hcl) .Marland Kitchen... Take 1 at bedtime    Cymbalta 60 Mg Cpep (Duloxetine hcl) .Marland Kitchen... Take 1 by mouth once daily  Problem # 3:  HYPERTENSION (ICD-401.9)  Her updated medication list for this problem includes:     Lisinopril 10 Mg Tabs (Lisinopril) .Marland Kitchen... Take 1 by mouth once daily  BP today: 98/72 Prior BP: 110/70 (11/06/2009)  10 Yr Risk Heart Disease: Not enough information  Labs Reviewed: K+: 3.9 (10/04/2009) Creat: : 0.80 (10/04/2009)     Problem # 4:  OSTEOPOROSIS (ICD-733.00) hx hip fx 06/2001 related to same - send for last dexa Her updated medication list for this problem includes:    Boniva 150 Mg Tabs (Ibandronate sodium) ..... Once monthly  Problem # 5:  HYPERLIPIDEMIA (ICD-272.4)  Her updated  medication list for this problem includes:    Crestor 10 Mg Tabs (Rosuvastatin calcium) .Marland Kitchen... Take 1/2 tablet daily  10 Yr Risk Heart Disease: Not enough information  Problem # 6:  ANEMIA-IRON DEFICIENCY (ICD-280.9)  Her updated medication list for this problem includes:    Ferrous Sulfate 325 (65 Fe) Mg Tabs (Ferrous sulfate) ..... Once daily  Hgb: 10.4 (11/06/2009)   Hct: 31.3 (11/06/2009)   Platelets: 210.0 (11/06/2009) RBC: 3.43 (11/06/2009)   RDW: 17.8 (11/06/2009)   WBC: 7.7 (11/06/2009) MCV: 91.2 (11/06/2009)   MCHC: 33.3 (11/06/2009) TSH: 1.388 (10/06/2009)  Problem # 7:  GASTROJEJ ULCR UNS ACUT/CHRN W/O HEMOR PERF/OBST (ICD-534.90)  NSAID induced - hosp 10/2009 -  mgmt per GI as ongoing Her updated medication list for this problem includes:    Nexium 40 Mg Cpdr (Esomeprazole magnesium) .Marland Kitchen... Take one pill once a day  EGD: DONE (10/06/2009)  Labs Reviewed: Hgb: 10.4 (11/06/2009)   Hct: 31.3 (11/06/2009)    ?DM - cont metfrmin and review labs from gyn when metformin prescribed earlier this year -- consider labs next OV if due   Complete Medication List: 1)  Nexium 40 Mg Cpdr (Esomeprazole magnesium) .... Take one pill once a day 2)  Lisinopril 10 Mg Tabs (Lisinopril) .... Take 1 by mouth once daily 3)  Ferrous Sulfate 325 (65 Fe) Mg Tabs (Ferrous sulfate) .... Once daily 4)  Caltrate 600+d 600-400 Mg-unit Tabs (Calcium carbonate-vitamin d) .... Once daily 5)  Crestor  10 Mg Tabs (Rosuvastatin calcium) .... Take 1/2 tablet daily 6)  Boniva 150 Mg Tabs (Ibandronate sodium) .... Once monthly 7)  Vivelle-dot 0.05 Mg/24hr Pttw (Estradiol) .... Use 1/2 of patch as dircted 8)  Trazodone Hcl 50 Mg Tabs (Trazodone hcl) .... Take 1 at bedtime 9)  Cymbalta 60 Mg Cpep (Duloxetine hcl) .... Take 1 by mouth once daily 10)  Metformin Hcl 500 Mg Tabs (Metformin hcl) .... Take 3 by mouth once daily 11)  Ultracet 37.5-325 Mg Tabs (Tramadol-acetaminophen) .Marland Kitchen.. 1-2 by mouth every 6 hours as needed for moderate pain 12)  Oxycodone-acetaminophen 5-325 Mg Tabs (Oxycodone-acetaminophen) .Marland Kitchen.. 1-2 by mouth every 6 hours as needed for severe pain  Hypertension Assessment/Plan:      The patient's hypertensive risk group is category B: At least one risk factor (excluding diabetes) with no target organ damage.  Today's blood pressure is 98/72.    Patient Instructions: 1)  it was good to see you today. 2)  we'll make referral to MRI brain to look for causes of headaches. Our office will contact you regarding this appointment once made.  3)  continue tylenol or ultracet or percocet as needed for headaches depending on severity as discussed - 4)  will send for records from dr. Nicholas Lose to review - 5)  Please schedule a follow-up appointment in 2-3 weeks to continue review, call sooner if problems.  Prescriptions: OXYCODONE-ACETAMINOPHEN 5-325 MG TABS (OXYCODONE-ACETAMINOPHEN) 1-2 by mouth every 6 hours as needed for severe pain  #30 x 0   Entered and Authorized by:   Newt Lukes MD   Signed by:   Newt Lukes MD on 11/15/2009   Method used:   Print then Give to Patient   RxID:   [id number] ULTRACET 37.5-325 MG TABS (TRAMADOL-ACETAMINOPHEN) 1-2 by mouth every 6 hours as needed for moderate pain  #40 x 1   Entered and Authorized by:   Newt Lukes MD   Signed by:   Newt Lukes MD on 11/15/2009  Method used:   Print then Give to Patient   RxID:    [id number]    Pap Smear  Procedure date:  07/02/2009  Findings:      Interpretation Result:Negative for intraepithelial Lesion or Malignancy.

## 2010-06-03 NOTE — Progress Notes (Signed)
Summary: Schedule procedures  Phone Note Other Incoming   Caller: Dr Christella Hartigan Summary of Call: Dr Christella Hartigan called and wants pt to have Barium esophagram and Upper GI ( characterize hiatal hernia) recall EGD in 2 months.  ROV in 4-5 weeks. Initial call taken by: Chales Abrahams CMA Duncan Dull),  October 07, 2009 10:47 AM  Follow-up for Phone Call        procedures and ROV scheduled left message on machine to call back  Chales Abrahams CMA Duncan Dull)  October 08, 2009 9:43 AM   Pt aware of procedures, ROV, and Recall for EGD in EMR.  Pt also asked that I schedule her an appt with Dr Windell Moulding.  Her appt is 11/15/09 2 pm pt aware of no show fee and arrive 15 minutes early Follow-up by: Chales Abrahams CMA Duncan Dull),  October 08, 2009 3:37 PM  New Problems: HIATAL HERNIA (ICD-553.3)   New Problems: HIATAL HERNIA (ICD-553.3)

## 2010-06-03 NOTE — Progress Notes (Signed)
Summary: Oxycodone  Phone Note Call from Patient Call back at Home Phone [phone] Call back at Work Phone [phone]   Caller: Patient Summary of Call: pt called requesting refill of Oxycodone Initial call taken by: Margaret Pyle, CMA,  December 30, 2009 10:44 AM  Follow-up for Phone Call        ok to give refill as prev rx'd Follow-up by: Newt Lukes MD,  December 30, 2009 10:50 AM  Additional Follow-up for Phone Call Additional follow up Details #1::        Pt informed, Rx sign and in cabinet for pick up.  Pt wanted MD to be aware that she has been taking 2 tabs of Oxycodone every am and it helps with HA pain for the entire day with no need for repeat dosages. Additional Follow-up by: Margaret Pyle, CMA,  December 30, 2009 11:30 AM    Additional Follow-up for Phone Call Additional follow up Details #2::    ok - can fill again as needed while [facility] -  thanks Newt Lukes MD  December 30, 2009 11:41 AM   Prescriptions: OXYCODONE-ACETAMINOPHEN 5-325 MG TABS (OXYCODONE-ACETAMINOPHEN) 1-2 by mouth every 6 hours as needed for severe pain  #30 x 0   Entered by:   Margaret Pyle, CMA   Authorized by:   Newt Lukes MD   Signed by:   Margaret Pyle, CMA on 12/30/2009   Method used:   Print then Give to Patient   RxID:   [id number]

## 2010-06-03 NOTE — Letter (Signed)
Summary: Beather Arbour MD  Beather Arbour MD   Imported By: Sherian Rein 11/26/2009 11:07:22  _____________________________________________________________________  External Attachment:    Type:   Image     Comment:   External Document

## 2010-06-03 NOTE — Letter (Signed)
Summary: [facility]  [facility]   Imported By: Lennie Odor 04/04/2010 15:40:55  _____________________________________________________________________  External Attachment:    Type:   Image     Comment:   External Document

## 2010-06-03 NOTE — Procedures (Signed)
Summary: Colonoscopy  Patient: Nylah Appleman Note: All result statuses are Final unless otherwise noted.  Tests: (1) Colonoscopy (COL)   COL Colonoscopy           DONE     [facility]     [address] [place], [place]  [address]           COLONOSCOPY PROCEDURE REPORT           PATIENT:  Caliann, Leckrone  MR#:  [id number]     BIRTHDATE:  [birth year], 59 yrs. old  GENDER:  female     ENDOSCOPIST:  Rachael Fee, MD     PROCEDURE DATE:  10/06/2009     PROCEDURE:  Colonoscopy with snare polypectomy     ASA CLASS:  Class II     INDICATIONS:  Iron Deficiency Anemia     MEDICATIONS:   Fentanyl 100 mcg IV, Versed 10 mg IV           DESCRIPTION OF PROCEDURE:   After the risks benefits and     alternatives of the procedure were thoroughly explained, informed     consent was obtained.  Digital rectal exam was performed and     revealed no rectal masses.   The  endoscope was introduced through     the anus and advanced to the cecum, which was identified by both     the appendix and ileocecal valve, without limitations.  The     quality of the prep was Moviprep fair.  The instrument was then     slowly withdrawn as the colon was fully examined.     <<PROCEDUREIMAGES>>           FINDINGS:  Two polyps were found. One was 5mm across, sessile,     clearly adenomatous, located in cecum, removed with cold snare but     not retrieived. The other was 5mm, somewhat inflammed appearing,     located in rectum, removed with cold snare. This was retrieved and     sent to pathology (jar 1). The rectal site bled more than usual     after resection and so 2 cc of dilute epinephrine was injected     with immediate hemostasis (see image4, image8, and image11).  Mild     diverticulosis was found in the sigmoid to descending colon     segments (see image2).  This was otherwise a normal examination of     the colon (see image7, image5, and image10).   Retroflexed views     in the rectum  revealed no abnormalities.    The scope was then     withdrawn from the patient and the procedure completed.           COMPLICATIONS:  None     ENDOSCOPIC IMPRESSION:     1) Two small polyps, both removed; one was retrieved. The polyp     that was not retrieved was clearly adenomatous. Rectal polypectomy     site required epinephine injection due to bleeding.     2) Mild diverticulosis in the sigmoid to descending colon     segments     3) Otherwise normal examination           RECOMMENDATIONS:           1) You will receive a letter within 1-2 weeks with the results     of your biopsy as well as final recommendations. Please call  my     office if you have not received a letter after 3 weeks.     2) Proceed with EGD now.     Unlikely that these findings explain her anemia.           ______________________________     Rachael Fee, MD           n.     eSIGNED:   Rachael Fee at 10/06/2009 01:58 PM           Ladell Heads, [phone]  Note: An exclamation mark (!) indicates a result that was not dispersed into the flowsheet. Document Creation Date: 10/07/2009 9:23 AM _______________________________________________________________________  (1) Order result status: Final Collection or observation date-time: 10/06/2009 13:50 Requested date-time:  Receipt date-time:  Reported date-time:  Referring Physician:   Ordering Physician: Rob Bunting [phone]) Specimen Source:  Source: Launa Grill Order Number: [id number] Lab site:

## 2010-06-03 NOTE — Procedures (Signed)
Summary: Upper Endoscopy  Patient: [name] Note: All result statuses are Final unless otherwise noted.  Tests: (1) Upper Endoscopy (EGD)   EGD Upper Endoscopy       DONE     [facility]     [address] [place], [place]  [address]           ENDOSCOPY PROCEDURE REPORT           PATIENT:  [name], [name]  MR#:  [id number]     BIRTHDATE:  05/05/1950, 59 yrs. old  GENDER:  female     ENDOSCOPIST:  Rachael Fee, MD     PROCEDURE DATE:  10/06/2009     PROCEDURE:  EGD with biopsy     ASA CLASS:  Class II     INDICATIONS:  IDA     MEDICATIONS:     There was residual sedation effect present from     prior procedure., Fentanyl 25 mcg IV, Benadryl 25 mg IV, Versed  3     mg IV     TOPICAL ANESTHETIC:  Cetacaine Spray     DESCRIPTION OF PROCEDURE:   After the risks benefits and     alternatives of the procedure were thoroughly explained, informed     consent was obtained.  The  endoscope was introduced through the     mouth and advanced to the second portion of the duodenum, without     limitations.  The instrument was slowly withdrawn as the mucosa     was fully examined.     <<PROCEDUREIMAGES>>     There were 4 small, clean based distal stomach ulcers. None     appeared malignant. Biopseis were taken from antrum to check for     H. pylori (see image4).  A hiatal hernia was found. Large hiatal     hernia that appeared to have a paraesophageal component. Near the     hiatus, there was a 5mm clean based ulcer that may be a Cameron's     type ulcer (see image9, image8, and image1).  Otherwise the     examination was normal (see image6 and image5).    Retroflexed     views revealed no abnormalities.    The scope was then withdrawn     from the patient and the procedure completed.     COMPLICATIONS:  None           ENDOSCOPIC IMPRESSION:     1) Several small, clean based ulcers.  Most in distal stomach     (probably NSAID related but biopsies taken to check for  H.     pylori).  One ulcer was near large hiatal hernia, perhaps a     Cameron's ulceration     2) Large hiatal hernia with possible paraeshageal component.     3) Otherwise normal examination           RECOMMENDATIONS:     UGI barium examination to characterize the hiatal hernia best.     If biopsies show H. pylori, she will be treated with appropriate     antibiotics.     Should refrain from further NSAIDs.     Should continue daily PPI, once orally is fine.     Dr. Christella Hartigan' office will arrange for repeat EGD in 2 months to     confirm ulcer healing.           ______________________________     Reuel Boom  [name], MD           n.     Rosalie Doctor:   Rachael Fee at 10/06/2009 02:12 PM           [name], [name], [phone]  Note: An exclamation mark (!) indicates a result that was not dispersed into the flowsheet. Document Creation Date: 10/07/2009 9:23 AM _______________________________________________________________________  (1) Order result status: Final Collection or observation date-time: 10/06/2009 14:03 Requested date-time:  Receipt date-time:  Reported date-time:  Referring Physician:   Ordering Physician: Rob Bunting [id number]) Specimen Source:  Source: Launa Grill Order Number: [id number] Lab site:

## 2010-06-03 NOTE — Progress Notes (Signed)
Summary: lab reminder  Phone Note Outgoing Call   Call placed by: Chales Abrahams CMA Duncan Dull),  January 15, 2010 8:36 AM Summary of Call: pt called and reminded to have labs Initial call taken by: Chales Abrahams CMA Duncan Dull),  January 15, 2010 8:37 AM     Appended Document: lab reminder pt called to say that she is going to have labs done as soon as she can.  Her mother has just been diagnosed with cancer and she has no time now to come in I advised her to call if she needs anything before then

## 2010-06-03 NOTE — Assessment & Plan Note (Signed)
Summary: 4-6 wk fu--d/t---stc   Vital Signs:  Patient profile:   60 year old female Height:      62 inches (157.48 cm) Weight:      144 pounds (65.45 kg) O2 Sat:      97 % on Room air Temp:     98.7 degrees F (37.06 degrees C) oral Pulse rate:   92 / minute BP sitting:   110 / 84  (left arm) Cuff size:   regular  Vitals Entered By: Orlan Leavens RMA (January 10, 2010 10:29 AM)  O2 Flow:  Room air CC: 4 week follow-up Is Patient Diabetic? No Pain Assessment Patient in pain? no        Primary Care Provider:  Rene Paci, MD  CC:  4 week follow-up.  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  The symptoms began >1 year ago.  On a scale of 1 to 10, the intensity is described as a 5.  near daily symptoms - occurs more days than not each week, sometimes daily. present since teenage years, maybe longer - prev tx self with  OTC advil 6/d but recent hosp related to NSAID induced ulcer/gastrtis and assoc iron-def anemia. since off advil, HA symptoms worse. good relief of HA in hosp with percocet, prior tx with tylenol and trmadol ineffective.  The patient denies nausea, vomiting, sweats, tearing of eyes, nasal congestion, sinus pain, sinus pressure, photophobia, and phonophobia.  The headache is described as intermittent and band-like.  The location of the pain is bitemporal.  High-risk features (red flags) include pain worse with exertion and age  >50 years.  The patient denies the following high-risk features: fever, neck pain/stiffness, vision loss or change, focal weakness, altered mental status, rash, trauma, new type of headache, immunosuppression, concomitant infection, and anticoagulation use.  The headaches are precipitated by stress.  Prior treatment has included a NSAID and a narcotic.    Clinical Review Panels:  Lipid Management   Cholesterol:  165 (07/15/2009)   LDL (bad choesterol):  82 (07/15/2009)   HDL (good cholesterol):  72 (07/15/2009)   Triglycerides:  58 (07/15/2009)  Diabetes Management   HgBA1C:  5.7 (07/15/2009)   Creatinine:  0.80 (10/04/2009)  CBC   WBC:  7.7 (11/06/2009)   RBC:  3.43 (11/06/2009)   Hgb:  10.4 (11/06/2009)   Hct:  31.3 (11/06/2009)   Platelets:  210.0 (11/06/2009)   MCV  91.2 (11/06/2009)   MCHC  33.3 (11/06/2009)   RDW  17.8 (11/06/2009)   PMN:  63.7 (11/06/2009)   Lymphs:  23.7 (11/06/2009)   Monos:  8.5 (11/06/2009)   Eosinophils:  3.6 (11/06/2009)   Basophil:  0.5 (11/06/2009)  Complete Metabolic Panel   Glucose:  103 (10/04/2009)   Sodium:  137 (10/04/2009)   Potassium:  3.9 (10/04/2009)   Chloride:  103 (10/04/2009)   CO2:  28 (10/04/2009)   BUN:  35 (10/04/2009)  Creatinine:  0.80 (10/04/2009)   Albumin:  4.4 (07/15/2009)   Total Protein:  6.5 (07/15/2009)   Calcium:  9.4 (07/15/2009)   Total Bili:  0.2 (07/15/2009)   Alk Phos:  56 (07/15/2009)   SGPT (ALT):  19 (07/15/2009)   SGOT (AST):  24 (07/15/2009)   Current Medications (verified): 1)  Nexium 40 Mg Cpdr (Esomeprazole Magnesium) .... Take One Pill Once A Day 2)  Lisinopril 10 Mg Tabs (Lisinopril) .... Take 1 By Mouth Once Daily 3)  Ferrous Sulfate 325 (65 Fe) Mg Tabs (Ferrous Sulfate) .... Once Daily 4)  Caltrate 600+d 600-400 Mg-Unit Tabs (Calcium Carbonate-Vitamin D) .... Once Daily 5)  Crestor 10 Mg Tabs (Rosuvastatin Calcium) .... Take 1/2 Tablet Daily 6)  Boniva 150 Mg Tabs (Ibandronate Sodium)  .... Once Monthly 7)  Vivelle-Dot 0.05 Mg/24hr Pttw (Estradiol) .... Use 1/2 of Patch As Dircted 8)  Trazodone Hcl 50 Mg Tabs (Trazodone Hcl) .... Take 1 At Bedtime 9)  Cymbalta 60 Mg Cpep (Duloxetine Hcl) .... Take 1 By Mouth Once Daily 10)  Metformin Hcl 500 Mg Tabs (Metformin Hcl) .... Take 3 By Mouth Once Daily 11)  Oxycodone-Acetaminophen 5-325 Mg Tabs (Oxycodone-Acetaminophen) .Marland Kitchen.. 1-2 By Mouth Every 6 Hours As Needed For Severe Pain 12)  Topamax 25 Mg Tabs (Topiramate) .Marland Kitchen.. 1 By Mouth Two Times A Day  (Or As Directed) 13)  Amitriptyline Hcl 25 Mg Tabs (Amitriptyline Hcl) .... Take 1 By Mouth At Bedtime  Allergies (verified): No Known Drug Allergies  Past History:  Past Medical History: Anemia-iron deficiency Depression  Hyperlipidemia Hypertension  Osteoporosis Colonic polyps, hx of glucose intolerance:  a1c 5.7 07/2009  MD roster: gyn - [facility] - [facility] ortho - daldorf Nsurg - roy neuro - [facility] @ [facility] (sched 01/30/10)  Review of Systems  The patient denies anorexia, fever, chest pain, and syncope.         c/o weight gain x 6 weeks  Physical Exam  General:  alert, well-developed, well-nourished, and cooperative to examination.    Lungs:  normal respiratory effort, no intercostal retractions or use of accessory muscles; normal breath sounds bilaterally - no crackles and no wheezes.    Heart:  normal rate, regular rhythm, no murmur, and no rub. BLE without edema. Neurologic:  alert & oriented X3 and cranial nerves II-XII symetrically intact.  strength normal in all extremities, sensation intact to light touch, and gait normal. speech fluent without dysarthria or aphasia; follows commands with good comprehension.    Impression & Recommendations:  Problem # 1:  HEADACHE (ICD-784.0)  Her updated medication list for this problem includes:    Oxycodone-acetaminophen 5-325 Mg Tabs (Oxycodone-acetaminophen) .Marland Kitchen... 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 consider refer for injections if med tx ineffective, ?role for biofeedback given tremedous stress induced component (worse since assuming care of mom 03/2009)- continue breakthru efforts to control pain with percocet until new eval by neuro specialist - 9/29 with freeman- new rx provided  Problem # 2:  WEIGHT GAIN (ICD-783.1) weight actually unchange since est care here  but pt reports inc in past 6 week - check labs now - consider reduction in metformin use if labs WNL-- ?if tx for DM or other (rx managed by dr. Nicholas Lose) Orders: TLB-TSH (Thyroid Stimulating Hormone) (84443-TSH) TLB-A1C / Hgb A1C (Glycohemoglobin) (83036-A1C)  Problem # 3:  ANEMIA-IRON DEFICIENCY (ICD-280.9) prev related to 10/2009 GIB - check again now Her updated medication list for this problem includes:    Ferrous Sulfate 325 (65 Fe) Mg Tabs (Ferrous sulfate) ..... Once daily  Orders: TLB-CBC Platelet - w/Differential (85025-CBCD)  Hgb: 10.4 (11/06/2009)   Hct: 31.3 (11/06/2009)   Platelets: 210.0 (11/06/2009) RBC: 3.43 (11/06/2009)   RDW: 17.8 (11/06/2009)   WBC: 7.7 (11/06/2009) MCV: 91.2 (11/06/2009)   MCHC: 33.3 (11/06/2009) TSH: 1.388 (10/06/2009)  Complete Medication List: 1)  Nexium 40 Mg Cpdr (Esomeprazole magnesium) .... Take one pill once a day 2)  Lisinopril 10 Mg Tabs (Lisinopril) .... Take 1 by mouth once daily 3)  Ferrous Sulfate 325 (65 Fe) Mg Tabs (Ferrous sulfate) .... Once daily 4)  Caltrate 600+d 600-400 Mg-unit Tabs (Calcium carbonate-vitamin d) .... Once  daily 5)  Crestor 10 Mg Tabs (Rosuvastatin calcium) .... Take 1/2 tablet daily 6)  Boniva 150 Mg Tabs (Ibandronate sodium) .... Once monthly 7)  Vivelle-dot 0.05 Mg/24hr Pttw (Estradiol) .... Use 1/2 of patch as dircted 8)  Trazodone Hcl 50 Mg Tabs (Trazodone hcl) .... Take 1 at bedtime 9)  Cymbalta 60 Mg Cpep (Duloxetine hcl) .... Take 1 by mouth once daily 10)  Metformin Hcl 500 Mg Tabs (Metformin hcl) .... Take 1 by mouth two times a day 11)  Oxycodone-acetaminophen 5-325 Mg Tabs (Oxycodone-acetaminophen) .Marland Kitchen.. 1-2 by mouth every 6 hours as needed for severe pain  Patient Instructions: 1)  it was good to see you today.  2)  topamax and amitriptyline removed from list as these are not helpful 3)  ok to use oxycodone as needed until further evaluation by headache specialist 4)  test(s) ordered today - your results will be called to you after review in 48-72 hours from the time of test completion- we may reduce the metformin dose after review of these labs 5)  Please schedule a follow-up appointment in 4-6 months to review symptoms and medications, call sooner if problems.  Prescriptions: OXYCODONE-ACETAMINOPHEN 5-325 MG TABS (OXYCODONE-ACETAMINOPHEN) 1-2 by mouth every 6 hours as needed for severe pain  #60 x 0   Entered and Authorized by:   Newt Lukes MD   Signed by:   Newt Lukes MD on 01/10/2010   Method used:   Print then Give to Patient   RxID:   [id number]

## 2010-06-03 NOTE — Letter (Signed)
Summary: EGD Instructions  [facility]  [address] [place], [place] [address]   Phone: [phone]  Fax: [phone]       [name]    [birth year]    MRN: [id number]       Procedure Day /Date:12/11/09 WED     Arrival Time: 930 am     Procedure Time:1030 am     Location of Procedure:                    [facility] (4th Floor)   PREPARATION FOR ENDOSCOPY   On 8/10 11 THE DAY OF THE PROCEDURE:  1.   No solid foods, milk or milk products are allowed after midnight the night before your procedure.  2.   Do not drink anything colored red or purple.  Avoid juices with pulp.  No orange juice.  3.  You may drink clear liquids until 830 am , which is 2 hours before your procedure.                                                                                                CLEAR LIQUIDS INCLUDE: Water Jello Ice Popsicles Tea (sugar ok, no milk/cream) Powdered fruit flavored drinks Coffee (sugar ok, no milk/cream) Gatorade Juice: apple, white grape, white cranberry  Lemonade Clear bullion, consomm, broth Carbonated beverages (any kind) Strained chicken noodle soup Hard Candy   MEDICATION INSTRUCTIONS  Unless otherwise instructed, you should take regular prescription medications with a small sip of water as early as possible the morning of your procedure.             OTHER INSTRUCTIONS  You will need a responsible adult at least 61 years of age to accompany you and drive you home.   This person must remain in the waiting room during your procedure.  Wear loose fitting clothing that is easily removed.  Leave jewelry and other valuables at home.  However, you may wish to bring a book to read or an iPod/MP3 player to listen to music as you wait for your procedure to start.  Remove all body piercing jewelry and leave at home.  Total time from sign-in until discharge is approximately 2-3 hours.  You should go home directly  after your procedure and rest.  You can resume normal activities the day after your procedure.  The day of your procedure you should not:   Drive   Make legal decisions   Operate machinery   Drink alcohol   Return to work  You will receive specific instructions about eating, activities and medications before you leave.    The above instructions have been reviewed and explained to me by   _______________________    I fully understand and can verbalize these instructions _____________________________ Date _________

## 2010-06-03 NOTE — Consult Note (Signed)
[facility]: [facility]   Imported By: Sherian Rein 02/04/2010 14:07:07  _____________________________________________________________________  External Attachment:    Type:   Image     Comment:   External Document

## 2010-06-03 NOTE — Assessment & Plan Note (Signed)
Review of gastrointestinal problems: 1. NSAID related to gastric ulcers, June 2011. EGD by Dr. Christella Hartigan found multiple distal gastric ulcers, Biopsies for H. pylori were negative. She also had a  large hiatal hernia  An ulcer near the hernia, question Cameron's related ulcer.  Followup upper GI study showed no paraesophageal component of hiatal hernia.  Presented with a hemoglobin of 6.6 2. presumed adenoma in colon, Colonoscopy June 2011.  Very typical appearing small tubular adenoma was removed but not retrieved. Recall colonoscopy at five-year interval   History of Present Illness Visit Type: Follow-up Visit Primary GI MD: Rob Bunting MD Primary Provider: Rene Paci, MD Chief Complaint: EGD/Colon History of Present Illness:     who has done well since d/c 1 months.  She has been on iron, has had dark colored stools.  She has taken NO nsaids.  No nausea/ vomiting/GERD/dyspepsia.  She feels very well, back to 100% now.  NO upper GI symptoms.              Current Medications (verified): 1)  Nexium 40 Mg Cpdr (Esomeprazole Magnesium) .... Two Times A Day 2)  Lisinopril 10 Mg Tabs (Lisinopril) .... Take 1/2 Tablet By Mouth Once Daily 3)  Ferrous Sulfate 325 (65 Fe) Mg Tabs (Ferrous Sulfate) .... Once Daily 4)  Caltrate 600+d 600-400 Mg-Unit Tabs (Calcium Carbonate-Vitamin D) .... Once Daily 5)  Crestor 10 Mg Tabs (Rosuvastatin Calcium) .... Take 1/2 Tablet Daily 6)  Cymbalta 20 Mg Cpep (Duloxetine Hcl) .... Once Daily 7)  Boniva 150 Mg Tabs (Ibandronate Sodium) .... Once Monthly 8)  Vivelle-Dot 0.05 Mg/24hr Pttw (Estradiol) .... Use 1/2 of Patch As Dircted  Allergies (verified): No Known Drug Allergies  Vital Signs:  Patient profile:   60 year old female Height:      62 inches Weight:      144 pounds BMI:     26.43 Pulse rate:   96 / minute Pulse rhythm:   regular BP sitting:   110 / 70  (left arm) Cuff size:   regular  Vitals Entered By: June McMurray CMA Duncan Dull)  (November 06, 2009 1:51 PM)  Physical Exam  Additional Exam:  Constitutional: generally well appearing Psychiatric: alert and oriented times 3 Abdomen: soft, non-tender, non-distended, normal bowel sounds    Impression & Recommendations:  Problem # 1:  gastric ulcers she will need another EGD in about one month's time to check for gastric ulcer healing. She knows to stay away from NSAIDs. She will stay on Nexium, once daily seems fine for now. She'll also get a CBC repeated today to see if she is responding to her iron supplementation.  Other Orders: TLB-CBC Platelet - w/Differential (85025-CBCD)  Patient Instructions: 1)  You will be scheduled to have an upper endoscopy in early August. 2)  Stay away from NSAIDs (as you have been doing). 3)  You will get lab test(s) done today (cbc). 4)  Nexium once a day prescription was given. 5)  A copy of this information will be sent to Dr. Felicity Coyer. 6)  The medication list was reviewed and reconciled.  All changed / newly prescribed medications were explained.  A complete medication list was provided to the patient / caregiver. Prescriptions: NEXIUM 40 MG CPDR (ESOMEPRAZOLE MAGNESIUM) take one pill once a day  #30 x 11   Entered and Authorized by:   Rachael Fee MD   Signed by:   Rachael Fee MD on 11/06/2009   Method used:  Electronically to        [facility]  [phone]* (retail)       [address] [place], [place]  [address]       Ph: [phone] or [phone]       Fax: [phone]   RxID:   [id number]   Appended Document: Orders Update/EGD    Clinical Lists Changes  Orders: Added new Test order of EGD (EGD) - Signed

## 2010-06-03 NOTE — Letter (Signed)
Summary: Appt Reminder 2  [facility]  [address] [place], [place] [address]   Phone: [phone]  Fax: [phone]        October 07, 2009 MRN: [id number]    [facility] [place], [place]  [address]    Dear Ms. Wescott,   You have a return appointment with Dr. Christella Hartigan on 11/06/09 at 1:45 pm.  Please remember to bring a complete list of the medicines you are taking, your insurance card and your co-pay.  If you have to cancel or reschedule this appointment, please call before 5:00 pm the evening before to avoid a cancellation fee.  If you have any questions or concerns, please call [phone].    Sincerely,    Chales Abrahams CMA (AAMA)  Appended Document: Appt Reminder 2 letter mailed

## 2010-06-03 NOTE — Letter (Signed)
Summary: Beather Arbour MD  Beather Arbour MD   Imported By: Sherian Rein 11/26/2009 11:08:16  _____________________________________________________________________  External Attachment:    Type:   Image     Comment:   External Document

## 2010-06-18 ENCOUNTER — Other Ambulatory Visit (HOSPITAL_COMMUNITY): Payer: Self-pay | Admitting: Gynecology

## 2010-06-18 DIAGNOSIS — Z1231 Encounter for screening mammogram for malignant neoplasm of breast: Secondary | ICD-10-CM

## 2010-07-14 ENCOUNTER — Ambulatory Visit (HOSPITAL_COMMUNITY)
Admission: RE | Admit: 2010-07-14 | Discharge: 2010-07-14 | Disposition: A | Discharge status: Home / Self Care | Payer: BC Managed Care – PPO | Source: Ambulatory Visit | Attending: Gynecology | Admitting: Gynecology

## 2010-07-14 DIAGNOSIS — Z1231 Encounter for screening mammogram for malignant neoplasm of breast: Secondary | ICD-10-CM | POA: Insufficient documentation

## 2010-07-21 LAB — URINALYSIS, ROUTINE W REFLEX MICROSCOPIC
Glucose, UA: NEGATIVE mg/dL
Ketones, ur: NEGATIVE mg/dL
Nitrite: NEGATIVE
Protein, ur: NEGATIVE mg/dL
Urobilinogen, UA: 0.2 mg/dL (ref 0.0–1.0)

## 2010-07-21 LAB — BASIC METABOLIC PANEL
CO2: 28 mEq/L (ref 19–32)
Chloride: 103 mEq/L (ref 96–112)
GFR calc Af Amer: >60 mL/min (ref >60)
Glucose, Bld: 103 mg/dL — ABNORMAL HIGH (ref 70–99)
Potassium: 3.9 mEq/L (ref 3.5–5.1)
Sodium: 137 mEq/L (ref 135–145)

## 2010-07-21 LAB — CBC
HCT: 20 % — ABNORMAL LOW (ref 36.0–46.0)
HCT: 25.1 % — ABNORMAL LOW (ref 36.0–46.0)
Hemoglobin: 6.6 g/dL — CL (ref 12.0–15.0)
Hemoglobin: 8.4 g/dL — ABNORMAL LOW (ref 12.0–15.0)
MCHC: 32.9 g/dL (ref 30.0–36.0)
MCHC: 32.9 g/dL (ref 30.0–36.0)
MCHC: 33.2 g/dL (ref 30.0–36.0)
MCHC: 33.4 g/dL (ref 30.0–36.0)
MCV: 86.8 fL (ref 78.0–100.0)
MCV: 88.6 fL (ref 78.0–100.0)
MCV: 88.9 fL (ref 78.0–100.0)
Platelets: 188 10*3/uL (ref 150–400)
Platelets: 193 10*3/uL (ref 150–400)
RBC: 2.31 MIL/uL — ABNORMAL LOW (ref 3.87–5.11)
RBC: 2.83 MIL/uL — ABNORMAL LOW (ref 3.87–5.11)
RDW: 16.1 % — ABNORMAL HIGH (ref 11.5–15.5)
RDW: 16.1 % — ABNORMAL HIGH (ref 11.5–15.5)
RDW: 16.9 % — ABNORMAL HIGH (ref 11.5–15.5)

## 2010-07-21 LAB — CROSSMATCH

## 2010-07-21 LAB — LACTIC ACID, PLASMA: Lactic Acid, Venous: 1.3 mmol/L (ref 0.5–2.2)

## 2010-07-21 LAB — VITAMIN B12: Vitamin B-12: 385 pg/mL (ref 211–911)

## 2010-07-21 LAB — RETICULOCYTES: Retic Ct Pct: 3.2 % — ABNORMAL HIGH (ref 0.4–3.1)

## 2010-07-21 LAB — DIFFERENTIAL
Eosinophils Relative: 1 % (ref 0–5)
Monocytes Absolute: 0.8 10*3/uL (ref 0.1–1.0)
Monocytes Relative: 11 % (ref 3–12)
Neutro Abs: 5 10*3/uL (ref 1.7–7.7)

## 2010-07-21 LAB — IRON AND TIBC
Iron: 14 ug/dL — ABNORMAL LOW (ref 42–135)
Saturation Ratios: 4 % — ABNORMAL LOW (ref 20–55)
TIBC: 388 ug/dL (ref 250–470)
UIBC: 374 ug/dL

## 2010-07-21 LAB — TSH: TSH: 1.388 u[IU]/mL (ref 0.350–4.500)

## 2010-07-21 LAB — POCT CARDIAC MARKERS
Myoglobin, poc: 42.9 ng/mL (ref 12–200)
Troponin i, poc: <0.05 ng/mL (ref 0.00–0.09)

## 2010-07-21 LAB — URINE MICROSCOPIC-ADD ON

## 2010-07-21 LAB — URINE CULTURE: Colony Count: 30000

## 2010-07-21 LAB — HEMOCCULT GUIAC POC 1CARD (OFFICE): Fecal Occult Bld: NEGATIVE

## 2010-07-30 ENCOUNTER — Other Ambulatory Visit: Payer: Self-pay | Admitting: Gynecology

## 2010-09-16 NOTE — Op Note (Signed)
NAMEDENIM, START             ACCOUNT NO.:  [id number]   MEDICAL RECORD NO.:  [id number]          PATIENT TYPE:  OIB   LOCATION:  5153                         FACILITY:  [facility]   PHYSICIAN:  Payton Doughty, M.D.      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   POSTOPERATIVE DIAGNOSES:  Cervical spondylosis at C3-4.   OPERATIVE PROCEDURE:  C3-4 anterior cervical decompression and fusion  with reflex hybrid plate.   SURGEON:  Payton Doughty, M.D.   ANESTHESIA:  General endotracheal anesthesia.   PREP:  Sterile Betadine scrub and alcohol wipe.   COMPLICATIONS:  None.   NURSE ASSISTANTS:  Covington.   DOCTORS ASSISTING:  Jenkins   BODY TEXT:  This is a 60 year old girl with prior fusion from C4 to C7  now with right C4 radiculopathy.  Taken to operating room, smoothly  anesthetized and intubated, placed supine on the operating table.  Following shave, prep and drape in the usual sterile fashion skin was  incised in the midline from the medial border of sternocleidomastoid  muscle on the left side.  The platysma was identified, elevated, divided  and undermined.  Sternocleidomastoid was identified.  Medial dissection  revealed the carotid artery, retracted laterally to the left.  The  trachea and esophagus retracted laterally to the right, exposing the  bones of the anterior cervical spine.  A marker was placed.  Using the  plate as a guide the 3-4 disk space was identified.  Diskectomy was  carried out at 3-4 under gross observation.  The operating microscope  was then brought in.  We used microdissection technique to remove the  remaining disks.  We removed the posterior longitudinal ligament and  opened the neural foramina bilaterally.  There was venous bleeding  encountered.  This was controlled with thrombin-soaked Gelfoam.  Following  complete decompression, the 8-mm bone graft was fashioned as  patellar allograft and tapped into place.  A 12-mm plate was then placed  with two screws in C4 and two in C3.  Intraoperative x-ray showed good  placement of bone graft, plate and screws.  Wound was irrigated.  Hemostasis assured.  Successive layers of 3-0 Vicryl, 4-0 Vicryl were  used to close.  Benzoin and Steri-Strips were placed.  An occlusive  dressing of Telfa and OpSite.  The patient returned to recovery room in  good condition.          ______________________________  Payton Doughty, M.D.    MWR/MEDQ  D:  12/01/2006  T:  12/02/2006  Job:  [id number]

## 2010-09-16 NOTE — H&P (Signed)
[name], [name]             ACCOUNT NO.:  [id number]   MEDICAL RECORD NO.:  [id number]          PATIENT TYPE:  OIB   LOCATION:  5153                         FACILITY:  [facility]   PHYSICIAN:  Payton Doughty, M.D.      DATE OF BIRTH:  Dec 04, 1950   DATE OF ADMISSION:  12/01/2006  DATE OF DISCHARGE:                              HISTORY & PHYSICAL   ADMISSION DIAGNOSIS:  Spondylosis and disk at C3-4.   HISTORY OF PRESENT ILLNESS:  This is a now 60 year old right-handed  white lady who operated on in 2003 for a lumbar spondylosis.  She has  also had an anterior decompression and fusion a few years ago from C4-C7  for cervical spondylosis with compression for cervical myelopathy.  She  reported to my office about a month ago with pain in her neck, down her  right arm and shoulder.  She had plain films and MR that show reasonable  fusion from C4-C7, but C3-4 has significant spondylosis with obstruction  of the right C3-4 neural foramen, and she is now admitted for anterior  decompression and fusion at C3-4.   MEDICAL HISTORY:  1. Very benign.  2. Hypertension.   MEDICATIONS:  1. Lisinopril 20 mg day.  2. Lexapro 20 mg a day.  3. Multivitamin.  4. Vitamin D.  5. Boniva.   ALLERGIES:  She has no allergies.   SURGICAL HISTORY:  1. Hip repair in the past.  2. Lumbar fusion.  3. Cervical fusion.   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   FAMILY HISTORY:  Mom is 35 in good health.  Dad is deceased.   REVIEW OF SYSTEMS:  Remarkable for wearing glasses, hypertension, neck  and arm pain.   PHYSICAL EXAMINATION:  HEENT exam normal limits.  She has well-healed  scar intact.  She has reasonable range of motion.  CHEST: Clear.  CARDIAC: Regular rate and rhythm.  ABDOMEN:  Nontender.  No hepatosplenomegaly.  EXTREMITIES:  No clubbing, cyanosis.  GU:  Deferred.  Peripheral pulses are good.  NEUROLOGICALLY:   Awake, alert and oriented.  Cranial nerves are intact.  Motor exam shows 5/5 strength throughout the upper and lower extremities  save for the right deltoid that is 5-/5 with pain limitation.  Sensation  intact.  Reflexes are one throughout the left upper extremity, one in  the right.  Hoffman's is negative.   STUDIES:  MR shows spondylosis at 03/04 with foraminal narrowing on the  right side.   PLAN:  Plan is for anterior decompression and fusion at C3-4, total  plate at Z6-1.  Risks and benefits of this approach have been discussed  with her.  She wishes proceed.           ______________________________  Payton Doughty, M.D.     MWR/MEDQ  D:  12/01/2006  T:  12/02/2006  Job:  [id number]

## 2010-09-19 NOTE — H&P (Signed)
[name], [name] NO.:  [id number]   MEDICAL RECORD NO.:  [id number]                   PATIENT TYPE:  INP   LOCATION:                                       FACILITY:  [facility]   PHYSICIAN:  Payton Doughty, M.D.                   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   HISTORY OF PRESENT ILLNESS:  This is a now 60 year old right-handed white  lady who had complaints of pain in her neck and down her arm.  It has been  going on for a couple of months.  She has had an MRI that shows severe  spondylosis at C5-6 and C6-7, spondylolisthesis at C4-5 and is admitted now  for fusion at those levels.   PAST MEDICAL HISTORY:  Remarkable for hypertension. She is on Accupril 20 mg  a day for that and Prozac 30 mg a day for depression.   ALLERGIES:  She has some sensitivity to CELEBREX in so far as it gives her a  rash.   PAST SURGICAL HISTORY:  Appendectomy in 1964, hip pinning in February of  2003, and lumbar fusion in April of 2003.   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   FAMILY HISTORY:  Mother is 79 and in good health, father is age 31 and has  Parkinson's disease.   REVIEW OF SYSTEMS:  Remarkable for hypertension, back and leg pain, back and  arm pain.  HEENT: Within normal limits.  She has reasonable range of motion  in her neck, but some Lhermitte's phenomenon.  CHEST: Clear. HEART:  Regular  rate and rhythm. ABDOMEN:  Nontender with no hepatosplenomegaly.  EXTREMITIES:  Without clubbing or cyanosis.  GENITOURINARY: Deferred.  Peripheral pulses are good. NEUROLOGY:  She is awake, alert, and oriented.  Cranial nerves are intact.  Motor examination shows 5/5 strength throughout  the upper and  lower extremities save for the right where the triceps is 4/5,  triceps reflex is absent on that side, on the left it is 1, the remainder of  the reflexes are 2.  Knee jerks are 1, ankle jerks are absent, toes are  downgoing bilaterally, but shows positive Hoffman's.   MRI results have been reviewed above, but basically showed cord compression  at C4-5, C5-6, and C6-7 with modest instability at 4-5.   PLAN:  Anterior cervical diskectomy.   IMPRESSION:  Cervical spondylosis with myelopathy.    PLAN:  Anterior cervical diskectomy and fusion at C4-5, C5-6, and C6-7. The  risks and benefits of this approach have been discussed with her and she  wishes to proceed.  Payton Doughty, M.D.    MWR/MEDQ  D:  08/08/2002  T:  08/08/2002  Job:  [id number]

## 2010-09-19 NOTE — Op Note (Signed)
   [name], [name]                        ACCOUNT NO.:  [id number]   MEDICAL RECORD NO.:  [id number]                   PATIENT TYPE:  AMB   LOCATION:  ENDO                                 FACILITY:  [facility]   PHYSICIAN:  Georgiana Spinner, M.D.                 DATE OF BIRTH:  10/31/1950   DATE OF PROCEDURE:  DATE OF DISCHARGE:                                 OPERATIVE REPORT   PROCEDURE:  Colonoscopy.   INDICATIONS FOR PROCEDURE:  Colon cancer screening.   ANESTHESIA:  Fentanyl 25 mcg, Versed 10 mg, Demerol 100 mg.   DESCRIPTION OF PROCEDURE:  With the patient mildly sedated in the left  lateral decubitus position, the Olympus videoscopic colonoscope was inserted  in the rectum and passed under direct vision to the cecum identified by the  ileocecal valve and appendiceal orifice both of which were photographed. We  entered into the terminal ileum which also appeared normal and was  photographed.  From this point, the colonoscope was slowly withdrawn taking  circumferential views of the remaining colonic mucosa stopping only in the  rectum which appeared normal in direct and showed hemorrhoids on retroflexed  view. The endoscope was straightened and withdrawn. The patient's vital  signs and pulse oximeter remained stable. The patient tolerated the  procedure well without apparent complications.   FINDINGS:  Internal hemorrhoids, otherwise, unremarkable colonoscopic  examination including the terminal ileum.   PLAN:  Have the patient followup with me as needed.                                                Georgiana Spinner, M.D.    GMO/MEDQ  D:  08/02/2002  T:  08/02/2002  Job:  [id number]

## 2010-09-19 NOTE — Op Note (Signed)
NAMESHAUNI, HENNER                        ACCOUNT NO.:  [id number]   MEDICAL RECORD NO.:  [id number]                   PATIENT TYPE:  INP   LOCATION:  3019                                 FACILITY:  [facility]   PHYSICIAN:  Payton Doughty, M.D.                   DATE OF BIRTH:  [birth year]   DATE OF PROCEDURE:  08/08/2002  DATE OF DISCHARGE:  08/09/2002                                 OPERATIVE REPORT   PREOPERATIVE DIAGNOSIS:  Spondylosis at C4-5, C5-6 and C6-7.   POSTOPERATIVE DIAGNOSIS:  Spondylosis at C4-5, C5-6 and C6-7.   OPERATION PERFORMED:  C4-5, C5-6, C6-7 anterior cervical decompression and  fusion with tether plating.   SURGEON:  Payton Doughty, M.D.   ASSISTANT:  1. Hilda Lias, M.D.  2. [place].   ANESTHESIA:  General endotracheal.   PREP:  Sterile Betadine prep and scrub with alcohol wipe.   COMPLICATIONS:  None.   INDICATIONS FOR PROCEDURE:  The patient is a 60 year old lady with severe  cervical spondylosis and myelopathy.   DESCRIPTION OF PROCEDURE:  The patient was taken to the operating room,  smoothly anesthetized and intubated and placed supine on the operating table  in the halter head traction with the neck slightly extended.  Following  shave, prep and drape in the usual sterile fashion, the skin was incised  from the midline to the medial border of the sternocleidomastoid muscle on  the left side at about the level of the carotid tubercle.  The platysma was  identified, elevated, divided and undermined.  The sternocleidomastoid was  identified.  Medial dissection revealed the carotid artery, retracted  laterally to the left and trachea and esophagus, retracted laterally to the  right exposing the bones of the anterior cervical spine.  A marker was  placed, intraoperative x-ray obtained, confirming correctness of the level.  Having confirmed correctness of the level, the longus colli was taken down  bilaterally and a Shadowline self-retaining  retractor placed.  With the  retractor in place, diskectomy was carried out under gross observation at C4-  5, C5-6 and C6-7.  The operating microscope was then brought in and we used  microdissection technique to decompress the remaining disk, remove the  posterior annulus and the posterior longitudinal ligament, remove the  osteophytes obstructing the neural foramina.  At C4-5 it was fairly unstable  with approximately 5 mm of movement of  C4 on C5.  There was grossly disk  herniation bilaterally into the neural foramina.  At C5-6, the joint was  nearly ankylosed with posterior protruding osteophytes bilaterally worse on  the left than on the right.  At C6-7, similar ankylosis was present to a  greater extent than at C5-6, once again with osteophytes projecting off to  the left more than off to the right.  Following complete decompression,  exploration of all nerve roots, the wound was irrigated and hemostasis  assured.  7 mm bone grafts were fashioned from patellar allograft and tapped  into place.  A 52 mm three-level plate was then placed using 12 mm screws,  two screws in C4, one in C5, one in C6 and two in C7.  Intraoperative x-ray  showed good placement of bone grafts, plate and screws.  The wound was  irrigated once again and hemostasis assured.  The platysma was  reapproximated with 3-0 Vicryl in interrupted fashion.  Subcutaneous tissues  were reapproximated with 3-0 Vicryl in interrupted fashion.  The skin was  closed with 4-0 Vicryl in running subcuticular fashion.  Benzoin and Steri-  Strips were placed and made occlusive with Telfa and Op-Site and the patient  was placed in an Aspen collar and returned to the recovery room.                                                Payton Doughty, M.D.    MWR/MEDQ  D:  08/08/2002  T:  08/09/2002  Job:  [id number]

## 2010-09-19 NOTE — H&P (Signed)
[facility] [facility]  Patient:    [name], [name] Visit Number: [id number] MRN: [id number]          Service Type: SUR Location: 3000 3019 01 Attending Physician:  Emeterio Reeve Dictated by:   Payton Doughty, M.D. 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  SERVICE:  Neurosurgery.  HISTORY OF PRESENT ILLNESS:  A 60 year old right-handed white lady who has had back pain at least since 1997.  She developed more back pain in October and MRI showed a grade 1 slip and spondylolysis of L5 on S1.  She was treated with epidural steroids and did not help her much.  She fell and broke her hip in February; Dr. Lubertha Basque. Dalldorf treated her for that.  After her visit with Dr. Jerl Santos, he sent her to me.  She has pain in her back and excruciating pain down her right leg; it goes from her hip, down the right side in both an L5 and S1 distribution.  Bladder is not affected.  PAST MEDICAL HISTORY:  Medical history is remarkable for a little bit of hypertension.  MEDICATIONS:  She is on Accupril 20 mg a day and Prozac 30 mg a day.  ALLERGIES:  She is sensitive to CELEBREX in so far as it gives her a rash.  PAST SURGICAL HISTORY:  An appendectomy in 1964 and a femoral repair in February of this year.  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  FAMILY HISTORY:  Her mom is 35 and in good health.  Daddy is 60 and in poor health with Parkinsons disease.  REVIEW OF SYSTEMS:  Review of systems is blissfully short, wearing glasses, hypertension, back pain and leg pain.  PHYSICAL EXAMINATION:  HEENT:  Within normal limits.  NECK:  She has good range of motion in her neck.  CHEST:  Clear.  CARDIAC:  Regular rate and rhythm.  ABDOMEN:  Nontender with on  hepatosplenomegaly.  EXTREMITIES:  Without clubbing or cyanosis.  Extremities are without deformities.  GU:  Deferred.  PERIPHERAL PULSES:  Good.  NEUROLOGIC:  She is awake, alert and oriented.  Her cranial nerves are intact. Motor exam shows 5/5 strength throughout the upper and lower extremities, save for the dorsiflexors of the right foot; they are 5-/5.  Sensory deficit is described in the right L5 and S1 distribution.  Deep tendon reflexes are flicker at the knees, 2 at the left ankle and 1 at the right ankle.  Straight leg raise is not tested because of pain.  LABORATORY AND ACCESSORY DATA:  MRI shows a grade 1 slip of 5 on 1, bilateral neural foraminal narrowing and encroachment on the 5 roots.  There appear to be bilateral pars defects and abundant redundant tissue at the L5-S1 joint.  CLINICAL IMPRESSION:  Symptomatic spondylolysis with a grade 1 slip.  PLAN:  The plan is for a lumbar laminectomy, diskectomy and posterior lumbar interbody fusion; this is to decompress the 5 foot and provide stability.  If pedicle screws are necessary for augmentation, they will be done at this setting.  The risks and benefits of this approach have been discussed with her and she wishes to proceed. Dictated  by:   Payton Doughty, M.D. Attending Physician:  Emeterio Reeve DD:  08/31/01 TD:  08/31/01 Job: [id number]

## 2010-09-19 NOTE — Discharge Summary (Signed)
[facility]  Patient:    [name], [name] Visit Number: [id number] MRN: [id number]          Service Type: SUR Location: 4W 0472 01 Attending Physician:  Marcene Corning Dictated by:   Prince Rome, P.A. Admit Date:  06/17/2001 Discharge Date: 06/21/2001                             Discharge Summary  ADMITTING DIAGNOSES: 1. Intertrochanteric left hip fracture. 2. Hypertension. 3. History of depression. 4. Contusion forearm.  DISCHARGE DIAGNOSES: 1. Intertrochanteric left hip fracture. 2. Hypertension. 3. History of depression. 4. Contusion forearm.  OPERATIONS:  Open reduction and internal fixation of left intertrochanteric hip fracture.  HISTORY OF PRESENT ILLNESS:  This is a 60 year old white female who was on a ladder the day of admission and she slipped and fell and landed on her left hip, was unable to bear weight, had significant pain and was transported to [facility] emergency room.  On the presentation, x-rays were taken and she had an intertrochanteric displaced hip fracture of her left hip.  I discussed treatment options with the patient and that being basically an open reduction and internal fixation of the left hip fracture.  PERTINENT LABORATORY AND X-RAY FINDINGS:  Chest showed no active disease. Left hip - comminuted left intertrochanteric hip fracture.  There were postoperative films taken with acceptable position alignment with ORIF materials in place and left forearm x-rays were negative.  On her last testing, her hemoglobin was 9.3.  She was on Coumadin, so she had serial pro times drawn for regulation of her INR.  HOSPITAL COURSE:  Patient was admitted through the emergency room, taken to the operating room and the above-mentioned operation was performed. Postoperatively, she was on low-dose Coumadin protocol per pharmacy to reduce the risk of DVT.  She was given appropriate pain medications, IV Ancef 1 g.8h. x 3  doses.  Her dressing was changed numerous times throughout her hospital stay and her wound was benign.  Physical therapy was ordered for progressive ambulation skills, touchdown weightbearing only on the left side.  She also was complaining of some discomfort in her left forearm.  X-rays were obtained and they were read out as normal.  CONDITION ON DISCHARGE:  Improved.  FOLLOW-UP:  She will remain on Coumadin for four weeks postoperatively.  She was given a prescription for pain medication.  She will be touchdown weightbearing on the left side and she will follow up in the office in approximately two weeks.  For any problems before then, she will call. Dictated by:   Prince Rome, P.A. Attending Physician:  Marcene Corning DD:  07/04/01 TD:  07/04/01 Job: [id number]

## 2010-09-19 NOTE — Op Note (Signed)
[facility]  Patient:    [name], [name] Visit Number: [id number] MRN: [id number]          Service Type: SUR Location: 4W 0472 01 Attending Physician:  Marcene Corning Dictated by:   Lubertha Basque. Jerl Santos, M.D. Proc. Date: 06/17/01 Admit Date:  06/17/2001                             Operative Report  PREOPERATIVE DIAGNOSIS:  Left hip intertrochanteric fracture.  POSTOPERATIVE DIAGNOSIS:  Left hip intertrochanteric fracture.  PROCEDURE:  Left hip dynamic hip screw.  ANESTHESIA:  General.  ATTENDING SURGEON:  Lubertha Basque. Jerl Santos, M.D.  ASSISTANT:  Prince Rome, P.A.  INDICATION FOR PROCEDURE:  The patient is a 60 year old woman, who fell off a ladder today and sustained a comminuted four-part intertrochanteric fracture of her left hip.  She was unable to stand and was taken to the emergency room where orthopedics was consulted.  She was offered stabilization with a dynamic hip screw in hopes of mobilizing her out of bed and speeding recovery from this injury.  Possible complications of reaction to anesthesia, infection, nonunion, and DVT were discussed with the patient, and informed operative consent was obtained.  DESCRIPTION OF PROCEDURE:  The patient was taken to the operating suite where general anesthetic was applied without difficulty.  She was positioned on a fracture table.  With some traction, internal rotation, we reduced her fracture anatomically under fluoroscopic guidance.  I read these views myself. She was then prepped and draped in normal sterile fashion.  After administration of preop IV antibiotics, a lateral approach was taken to the subtrochanteric region of the hip.  Dissection was carried down through a small amount of adipose tissue to the fascia lata which was incised longitudinally.  I incised the fascia of the vastus lateralis.  The underlying tensor fascia lata fascia was incised followed by blunt dissection down to  the bone.  A guidewire was placed with the 135 degree guide from the lateral flare up into the femoral head.  Central placement was confirmed on two fluoroscopic views which I read.  Estimated depth was about 80 mm, and we reamed to 75 following placement of a 75 mm Synthes dynamic hip screw.  We achieved excellent purchase, and she was found to have excellent bone quality. A five-hole 135 degree side-plate was then attached to the dynamic hip screw and to the proximal femur.  Five screws were placed, achieving bicortical purchase on each.  The construct was examined under fluoroscopy, and everything was found to be anatomically reduced and well-placed.  I read all of these views myself.  The wound was then irrigated followed by reapproximation of the vastus lateralis fascia with Vicryl and fascia lata with Vicryl in an interrupted fashion.  Subcutaneous tissue was reapproximated with 2-0 undyed Vicryl followed by skin closure with a running subcuticular stitch. Steri-Strips were applied followed by Adaptic and dry gauze dressing with tape.  Estimated blood loss and intraoperative fluids can be obtained fro anesthesia records.  DISPOSITION:  The patient was extubated in the operating room and taken to the recovery room stable, and plans were for her to be admitted to the orthopedic surgery service for appropriate postop care to include perioperative antibiotics and a course of Coumadin for DVT prophylaxis. Dictated by:   Lubertha Basque Jerl Santos, M.D. Attending Physician:  Marcene Corning DD:  06/17/01 TD:  06/18/01 Job: [id number]

## 2010-09-19 NOTE — Op Note (Signed)
[name], BARTL             ACCOUNT NO.:  [id number]   MEDICAL RECORD NO.:  [id number]          PATIENT TYPE:  AMB   LOCATION:  NESC                         FACILITY:  [facility]   PHYSICIAN:  Gretta Cool, M.D. DATE OF BIRTH:  27-Sep-1950   DATE OF PROCEDURE:  DATE OF DISCHARGE:                                 OPERATIVE REPORT   PREOPERATIVE DIAGNOSIS:  Endometrial polyp with abnormal uterine bleeding.   POSTOPERATIVE DIAGNOSIS:  Endometrial polyp with abnormal uterine bleeding.   PROCEDURE:  Hysteroscopy, resection of 2 cm endometrial polyp, total  endometrial resection for ablation plus Vapotrode.   ANESTHESIA:  IV sedation, paracervical block.   SURGEON:  Gretta Cool, M.D.   DESCRIPTION OF PROCEDURE:  Under excellent IV sedation as above, with the  patient prepped and draped in Allen stirrups in lithotomy position with her  bladder drained, weighted speculum is placed in the vagina and cervix  grasped with single-tooth tenaculum.  After paracervical block anesthesia  was administered, the cervix was progressively dilated with series of Pratt  dilators to accommodate the 7 mm resectoscope.  The polyp was then  photographed.  The polyp was documented and then totally resected by the 90  degree resectoscope loop.  The total endometrial cavity was then  systematically resected down to 5 mm or so into the myometrium.  Once all  the endometrial tissue had been removed that was identifiable, the cavity  was treated by Vapotrode so as to eliminate any __________ of residual  viable endometrial tissue.  At this point, the procedure was terminated  without complications.  There was no significant bleeding at reduced  pressure.   ESTIMATED FLUID DEFICIT:  95 mL.   COMPLICATIONS:  None.   Patient returned to the recovery room in excellent condition.      CWL/MEDQ  D:  07/28/2004  T:  07/28/2004  Job:  [id number]

## 2010-09-19 NOTE — Op Note (Signed)
[facility]  Patient:    ZAYLAH, BLECHA Visit Number: [id number] MRN: [id number]          Service Type: SUR Location: [facility] 3172 01 Attending Physician:  Emeterio Reeve Dictated by:   Payton Doughty, M.D. Proc. Date: 08/31/01 Admit Date:  08/31/2001                             Operative Report  PREOPERATIVE DIAGNOSIS:  Spondylolysis with spondylolisthesis at L5-S1.  POSTOPERATIVE DIAGNOSIS:  Degenerative spondylolisthesis at L5-S1.  OPERATION PERFORMED:  L5-S1 laminectomy, facetectomy and pedicle screw fixation at L5-S1 nonsegmental with posterolateral arthrodesis.  SURGEON:  Payton Doughty, M.D.  ANESTHESIA:  General endotracheal.  PREP:  Sterile Betadine prep and scrub with alcohol wipe.  COMPLICATIONS:  None.  INDICATIONS FOR PROCEDURE:  The patient is a 60 year old right-handed white female with severe left leg pain and spondylolisthesis of L5 on S1.  DESCRIPTION OF PROCEDURE:  The patient was taken to the operating room, smoothly anesthetized and intubated, and placed prone on the operating table. Following shave, prep and drape in the usual sterile fashion, the skin was infiltrated with 1% lidocaine with 1:400,000 epinephrine.  The skin was incised from mid-S1 to the top of L4 and the lamina of L5 and S1 were exposed bilaterally over the facet joints to the transverse process of 5 and the sacral ala.  Intraoperative x-ray confirmed correctness of the level.  It was felt initially that there would be a pars defect at L5.  There was, however, no pars defect.  Therefore, the pars interarticularis, lamina and inferior facet of L5 and the superior facet of S1 were removed bilaterally.  There was no reduction of the 5-1 ____________ .  On the left side the 5 root was trapped between the pedicle of S1 and the slip.  Decompression of the 5 root was undertaken and accomplished.  On the right side there was much less severe compression of  the 5 root.  It was note anatomically feasible to do an interbody fusion on this because of the persistent angulated slip and because of the location of the 5 root being right over a disk space.  It was therefore elected to do fuse her as is and pedicle screws were placed at L5 and S1. There was some distraction undertaken on the pedicle screws with rod placement so as to provide further decompression of the 5 roots.  Having completed this, bone graft was placed in posterolateral position after decorticating the transverse process and sacral ala with a high speed drill.  Bone graft consisted of donor bone and demineralized bone matrix.  Caps were tightened.  Intraoperative x-ray showed good placement of screws and rods.  The fascia was reapproximated with 0 Vicryl in interrupted fashion. Subcutaneous tissues were reapproximated with 0 Vicryl in interrupted fashion. Subcuticular tissues were reapproximated with 3-0 Vicryl in interrupted fashion.  Skin was closed with 3-0 nylon in a running locked fashion. Bacitracin Telfa dressing was applied and made occlusive with Op-Site. The patient was transferred to the recovery room in good condition. Dictated by:   Payton Doughty, M.D. Attending Physician:  Emeterio Reeve DD:  08/31/01 TD:  08/31/01 Job: [id number]

## 2010-11-28 ENCOUNTER — Encounter: Payer: Self-pay | Admitting: Internal Medicine

## 2011-02-16 LAB — CBC
HCT: 34.4 — ABNORMAL LOW
Hemoglobin: 11.8 — ABNORMAL LOW
MCHC: 34.4
MCV: 98.3
RBC: 3.5 — ABNORMAL LOW
RDW: 13.3

## 2011-02-16 LAB — COMPREHENSIVE METABOLIC PANEL
ALT: 26
BUN: 16
CO2: 24
Calcium: 9.4
Creatinine, Ser: 0.7
GFR calc non Af Amer: >60
Glucose, Bld: 93
Total Protein: 6.9

## 2011-02-16 LAB — URINALYSIS, ROUTINE W REFLEX MICROSCOPIC
Hgb urine dipstick: NEGATIVE
Nitrite: NEGATIVE
Protein, ur: NEGATIVE
Urobilinogen, UA: 0.2

## 2011-02-16 LAB — DIFFERENTIAL
Eosinophils Absolute: 0.2
Lymphocytes Relative: 29
Lymphs Abs: 2.1
Monocytes Relative: 9
Neutro Abs: 4.3
Neutrophils Relative %: 59

## 2011-02-16 LAB — PROTIME-INR
INR: 0.9
Prothrombin Time: 12.6

## 2011-02-16 LAB — APTT: aPTT: 26

## 2011-06-16 ENCOUNTER — Other Ambulatory Visit (HOSPITAL_COMMUNITY): Payer: Self-pay | Admitting: Gynecology

## 2011-06-16 DIAGNOSIS — Z1231 Encounter for screening mammogram for malignant neoplasm of breast: Secondary | ICD-10-CM

## 2011-07-15 ENCOUNTER — Ambulatory Visit (HOSPITAL_COMMUNITY)
Admission: RE | Admit: 2011-07-15 | Discharge: 2011-07-15 | Disposition: A | Discharge status: Home / Self Care | Payer: BC Managed Care – PPO | Source: Ambulatory Visit | Attending: Gynecology | Admitting: Gynecology

## 2011-07-15 DIAGNOSIS — Z1231 Encounter for screening mammogram for malignant neoplasm of breast: Secondary | ICD-10-CM | POA: Insufficient documentation

## 2011-08-05 ENCOUNTER — Other Ambulatory Visit: Payer: Self-pay | Admitting: Gynecology

## 2012-06-14 ENCOUNTER — Other Ambulatory Visit (HOSPITAL_COMMUNITY): Payer: Self-pay | Admitting: Gynecology

## 2012-06-14 ENCOUNTER — Other Ambulatory Visit (HOSPITAL_COMMUNITY): Payer: Self-pay | Admitting: Obstetrics and Gynecology

## 2012-06-14 DIAGNOSIS — Z1231 Encounter for screening mammogram for malignant neoplasm of breast: Secondary | ICD-10-CM

## 2012-07-06 ENCOUNTER — Other Ambulatory Visit (HOSPITAL_COMMUNITY): Payer: Self-pay | Admitting: Gynecology

## 2012-07-06 ENCOUNTER — Other Ambulatory Visit: Payer: Self-pay | Admitting: Gynecology

## 2012-07-15 ENCOUNTER — Ambulatory Visit (HOSPITAL_COMMUNITY)
Admission: RE | Admit: 2012-07-15 | Discharge: 2012-07-15 | Disposition: A | Discharge status: Home / Self Care | Payer: BC Managed Care – PPO | Source: Ambulatory Visit | Attending: Obstetrics and Gynecology | Admitting: Obstetrics and Gynecology

## 2012-07-15 DIAGNOSIS — Z1231 Encounter for screening mammogram for malignant neoplasm of breast: Secondary | ICD-10-CM | POA: Insufficient documentation

## 2012-12-01 ENCOUNTER — Telehealth: Payer: Self-pay | Admitting: Gastroenterology

## 2012-12-01 ENCOUNTER — Telehealth: Payer: Self-pay

## 2012-12-01 ENCOUNTER — Ambulatory Visit (INDEPENDENT_AMBULATORY_CARE_PROVIDER_SITE_OTHER): Payer: BC Managed Care – PPO | Admitting: Gastroenterology

## 2012-12-01 ENCOUNTER — Encounter: Payer: Self-pay | Admitting: Gastroenterology

## 2012-12-01 VITALS — BP 90/60 | HR 80 | Ht 61.5 in | Wt 120.2 lb

## 2012-12-01 DIAGNOSIS — K649 Unspecified hemorrhoids: Secondary | ICD-10-CM

## 2012-12-01 DIAGNOSIS — K6289 Other specified diseases of anus and rectum: Secondary | ICD-10-CM

## 2012-12-01 MED ORDER — HYDROCORTISONE 2.5 % RE CREA: TOPICAL_CREAM | Freq: Every day | RECTAL | Status: DC

## 2012-12-01 NOTE — Patient Instructions (Addendum)
We have sent the following medications to your pharmacy for you to pick up at your convenience: Anusol Suppositories to use at bedtime You have been referred for an appointment  at [facility]. You will get a call from that office with the appointment date and time.  Make certain to bring a list of current medications, including any over the counter medications or vitamins. Also bring your co-pay if you have one as well as your insurance cards. [facility] is located at [address], [address]. Should you need to reschedule your appointment, please contact them at [phone]. CC:  Juline Patch MD

## 2012-12-01 NOTE — Telephone Encounter (Signed)
[facility] to notify pt  

## 2012-12-01 NOTE — Telephone Encounter (Signed)
Message copied by Donata Duff on Thu Dec 01, 2012  4:00 PM ------      Message from: Zacarias Pontes      Created: Thu Dec 01, 2012  3:57 PM       Sorry Alexia Freestone I didn't even see the requesting doc in there...ok pt is scheduled with Dr Maisie Fus on 9/3 arrive at 3.20 for a 3.50 apt,this is Dr Maisie Fus first available      ----- Message -----         From: Donata Duff, CMA         Sent: 12/01/2012   3:31 PM           To: Waymon Budge and Dr Leone Payor prefer the pt be seen by Romie Levee.  Thank you      ----- Message -----         From: Zacarias Pontes         Sent: 12/01/2012   3:06 PM           To: Donata Duff, CMA            Malgorzata Albert,Pt has an apt with Dr Luretha Murphy on 8/6 arrive at 2.15 for a 2.40pm apt...this is my first available.Marland KitchenMarland KitchenLiborio Nixon.. I will call pt      ----- Message -----         From: Donata Duff, CMA         Sent: 12/01/2012   2:13 PM           To: Zacarias Pontes            Pt needs an appt with Romie Levee for possible rectal prolapse.  Thank you                   ------

## 2012-12-01 NOTE — Telephone Encounter (Signed)
Pt states minimal rectal bleeding, clear discharge, thinks she has had a rectal prolapse.  Back pain.  Appt with Shanda Bumps today at 130 pm

## 2012-12-01 NOTE — Progress Notes (Signed)
12/01/2012 [name] [id number] 1951-04-13   History of Present Illness: Patient comes to the office today with rectal complaints.  She says that yesterday when she got to work her underwear were all wet and had clear mucus like material.  When she went home she looked in the mirror and there was a large red colored protrusion from her rectum.  She said that she almost passed out when she saw it but pushed it back up inside.  She had a BM this AM but did not strain and it has not come back out again.  She has pressure in her lower back yesterday and today as well.  Had a small amount of blood yesterday as well.  Last colonoscopy was 2011 at which time she had one adenoma removed with recommended repeat in 5 years from that time.  Current Medications, Allergies, Past Medical History, Past Surgical History, Family History and Social History were reviewed in [employer] record.   Physical Exam: BP 90/60  Pulse 80  Ht 5' 1.5" (1.562 m)  Wt 120 lb 4 oz (54.545 kg)  BMI 22.36 kg/m2 General: Well developed, white female in no acute distress Head: Normocephalic and atraumatic Eyes:  sclerae anicteric, conjunctiva pink  Ears: Normal auditory acuity Lungs: Clear throughout to auscultation Heart: Regular rate and rhythm Abdomen: Soft, non-tender and non-distended. No masses, no hepatomegaly. Normal bowel sounds. Rectal: Small external hemorrhoids noted.  No rectal prolapse noted when bearing down.  Anoscopy exam revealed small internal hemorrhoids.  No bleeding noted at time of exam. Musculoskeletal: Symmetrical with no gross deformities  Extremities: No edema  Neurological: Alert oriented x 4, grossly nonfocal Psychological:  Alert and cooperative. Normal mood and affect  Assessment and Recommendations: -Rectal discomfort with slight bleeding and protusion:  I suspect that she has a rectal prolapse from the history, but could not demonstrate that on exam today.  There  were some small internal hemorrhoids as well, however.  I will give her some anusol suppositories to use in the meantime but we will refer her to Dr. Maisie Fus with [facility].

## 2012-12-02 NOTE — Progress Notes (Signed)
i agree with the plan above 

## 2012-12-07 ENCOUNTER — Encounter (INDEPENDENT_AMBULATORY_CARE_PROVIDER_SITE_OTHER): Payer: Self-pay | Admitting: Surgery

## 2012-12-07 ENCOUNTER — Ambulatory Visit (INDEPENDENT_AMBULATORY_CARE_PROVIDER_SITE_OTHER): Payer: BC Managed Care – PPO | Admitting: Surgery

## 2012-12-07 VITALS — BP 116/60 | HR 64 | Temp 96.9°F | Resp 14 | Ht 61.5 in | Wt 117.4 lb

## 2012-12-07 DIAGNOSIS — K623 Rectal prolapse: Secondary | ICD-10-CM

## 2012-12-07 NOTE — Patient Instructions (Signed)
Prolapse  Prolapse means the falling down, bulging, dropping, or drooping of a body part. Organs that commonly prolapse include the rectum, small intestine, bladder, urethra, vagina (birth canal), uterus (womb), and cervix. Prolapse occurs when the ligaments and muscle tissue around the rectum, bladder, and uterus are damaged or weakened.  CAUSES  This happens especially with:  Childbirth. Some women feel pelvic pressure or have trouble holding their urine right after childbirth, because of stretching and tearing of pelvic tissues. This generally gets better with time and the feeling usually goes away, but it may return with aging.  Chronic heavy lifting.  Aging.  Menopause, with loss of estrogen production weakening the pelvic ligaments and muscles.  Past pelvic surgery.  Obesity.  Chronic constipation.  Chronic cough. Prolapse may affect a single organ, or several organs may prolapse at the same time. The front wall of the vagina holds up the bladder. The back wall holds up part of the lower intestine, or rectum. The uterus fills a spot in the middle. All these organs can be involved when the ligaments and muscles around the vagina relax too much. This often gets worse when women stop producing estrogen (menopause). SYMPTOMS  Uncontrolled loss of urine (incontinence) with cough, sneeze, straining, and exercise.  More force may be required to have a bowel movement, due to trapping of the stool.  When part of an organ bulges through the opening of the vagina, there is sometimes a feeling of heaviness or pressure. It may feel as though something is falling out. This sensation increases with coughing or bearing down.  If the organs protrude through the opening of the vagina and rub against the clothing, there may be soreness, ulcers, infection, pain, and bleeding.  Lower back pain.  Pushing in the upper or lower part of the vagina, to pass urine or have a bowel movement.  Problems  having sexual intercourse.  Being unable to insert a tampon or applicator. DIAGNOSIS  Usually, a physical exam is all that is needed to identify the problem. During the examination, you may be asked to cough and strain while lying down, sitting up, and standing up. Your caregiver will determine if more testing is required, such as bladder function tests. Some diagnoses are:  Cystocele: Bulging and falling of the bladder into the top of the vagina.  Rectocele: Part of the rectum bulging into the vagina.  Prolapse of the uterus: The uterus falls or drops into the vagina.  Enterocele: Bulging of the top of the vagina, after a hysterectomy (uterus removal), with the small intestine bulging into the vagina. A hernia in the top of the vagina.  Urethrocele: The urethra (urine carrying tube) bulging into the vagina. TREATMENT  In most cases, prolapse needs to be treated only if it produces symptoms. If the symptoms are interfering with your usual daily or sexual activities, treatment may be necessary. The following are some measures that may be used to treat prolapse.  Estrogen may help elderly women with mild prolapse.  Kegel exercises may help mild cases of prolapse, by strengthening and tightening the muscles of the pelvic floor.  Pessaries are used in women who choose not to, or are unable to, have surgery. A pessary is a doughnut-shaped piece of plastic or rubber that is put into the vagina to keep the organs in place. This device must be fitted by your caregiver. Your caregiver will also explain how to care for yourself with the pessary. If it works well for you,   this may be the only treatment required.  Surgery is often the only form of treatment for more severe prolapses. There are different types of surgery available. You should discuss what the best procedure is for you. If the uterus is prolapsed, it may be removed (hysterectomy) as part of the surgical treatment. Your caregiver will  discuss the risks and benefits with you.  Uterine-vaginal suspension (surgery to hold up the organs) may be used, especially if you want to maintain your fertility. No form of treatment is guaranteed to correct the prolapse or relieve the symptoms. HOME CARE INSTRUCTIONS   Wear a sanitary pad or absorbent product if you have incontinence of urine.  Avoid heavy lifting and straining with exercise and work.  Take over-the-counter pain medicine for minor discomfort.  Try taking estrogen or using estrogen vaginal cream.  Try Kegel exercises or use a pessary, before deciding to have surgery.  Do Kegel exercises after having a baby. SEEK MEDICAL CARE IF:   Your symptoms interfere with your daily activities.  You need medicine to help with the discomfort.  You need to be fitted with a pessary.  You notice bleeding from the vagina.  You think you have ulcers or you notice ulcers on the cervix.  You have an oral temperature above 102 F (38.9 C).  You develop pain or blood with urination.  You have bleeding with a bowel movement.  The symptoms are interfering with your sex life.  You have urinary incontinence that interferes with your daily activities.  You lose urine with sexual intercourse.  You have a chronic cough.  You have chronic constipation. Document Released: 10/25/2002 Document Revised: 07/13/2011 Document Reviewed: 05/05/2009 [employer] Patient Information 2014 [employer].  

## 2012-12-07 NOTE — Progress Notes (Signed)
Chief Complaint:  2 episodes of rectal prolapse  History of Present Illness:  [name] is an 62 y.o. female was getting ready for work on 2 occasions recently when she was hurting in straining at stool and prolapsed about 2 inches of her rectum. On both cases she was able to reduce this. She admits to reading on the toilet and straining.  She denies any pelvic floor trauma including childbirth or pelvic lacerations. She has not had any prior anorectal surgery.  Past Medical History  Diagnosis Date  . 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 COLONIC POLYPS, HX OF 11/15/2009  . DEPRESSION 11/14/2009  . GASTROJEJ ULCR UNS ACUT/CHRN W/O HEMOR PERF/OBST 11/06/2009  . Headache(784.0) 11/15/2009  . HEART MURMUR, HX OF 11/15/2009  . HIATAL HERNIA 10/08/2009  . HIP FRACTURE, LEFT 11/14/2009  . HYPERLIPIDEMIA 11/14/2009  . HYPERTENSION 11/14/2009  . OSTEOPOROSIS 11/14/2009  . WEIGHT GAIN 01/10/2010  . Asthma   . Heart murmur     Past Surgical History  Procedure Laterality Date  . Bleeding ulcers  10/2009  . Broken hip  06/2001  . Back surgery    . Neck surgery  04 & 06    x's 2    Current Outpatient Prescriptions  Medication Sig Dispense Refill  . atorvastatin (LIPITOR) 10 MG tablet Take 10 mg by mouth daily.      . Calcium Carbonate-Vitamin D (CALTRATE 600+D) 600-400 MG-UNIT per tablet Take 1 tablet by mouth daily.        . clonazePAM (KLONOPIN) 1 MG tablet       . DULoxetine (CYMBALTA) 60 MG capsule Take 60 mg by mouth daily.        Marland Kitchen lisinopril (PRINIVIL,ZESTRIL) 10 MG tablet Take 10 mg by mouth daily.        Marland Kitchen oxyCODONE-acetaminophen (PERCOCET) 5-325 MG per tablet Take 1 tablet by mouth every 6 (six) hours as needed.        . traMADol (ULTRAM) 50 MG tablet       . traZODone (DESYREL) 50 MG tablet Take 50 mg by mouth at bedtime.        . vitamin B-12 (CYANOCOBALAMIN) 1000 MCG tablet Take 1,000 mcg by mouth daily.      Marland Kitchen esomeprazole (NEXIUM) 40 MG capsule Take 40 mg by mouth daily before  breakfast.        . hydrocortisone (ANUSOL-HC) 2.5 % rectal cream Place rectally at bedtime.  30 g  1   No current facility-administered medications for this visit.   Review of patient's allergies indicates no known allergies. Family History  Problem Relation Age of Onset  . Hypertension Other   . Lung cancer Mother    Social History:   reports that she quit smoking about 30 years ago. She has never used smokeless tobacco. She reports that  drinks alcohol. She reports that she does not use illicit drugs.   REVIEW OF SYSTEMS - PERTINENT POSITIVES ONLY: Negative for DVT  Physical Exam:   Blood pressure 116/60, pulse 64, temperature 96.9 F (36.1 C), temperature source Temporal, resp. rate 14, height 5' 1.5" (1.562 m), weight 117 lb 6.4 oz (53.252 kg). Body mass index is 21.83 kg/(m^2).  Gen:  WDWN white female NAD  Neurological: Alert and oriented to person, place, and time. Motor and sensory function is grossly intact  Head: Normocephalic and atraumatic.  Eyes: Conjunctivae are normal. Pupils are equal, round, and reactive to light. No scleral icterus.   Abdomen:  Not examined GU:  Rectum is not prolapsed.  Some external skin tags.  Internal sphincter is intact but not much of a Kaegle squeeze.   Musculoskeletal: Normal range of motion. Extremities are nontender. No cyanosis, edema or clubbing noted Lymphadenopathy: No cervical, preauricular, postauricular or axillary adenopathy is present Skin: Skin is warm and dry. No rash noted. No diaphoresis. No erythema. No pallor. Pscyh: Normal mood and affect. Behavior is normal. Judgment and thought content normal.   LABORATORY RESULTS: No results found for this or any previous visit (from the past 48 hour(s)).  RADIOLOGY RESULTS: No results found.  Problem List: Patient Active Problem List   Diagnosis Date Noted  . Rectal discomfort 12/01/2012  . Hemorrhoids 12/01/2012  . WEIGHT GAIN 01/10/2010  . HEADACHE 11/15/2009  . HEART  MURMUR, HX OF 11/15/2009  . COLONIC POLYPS, HX OF 11/15/2009  . HYPERLIPIDEMIA 11/14/2009  . 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 DEPRESSION 11/14/2009  . HYPERTENSION 11/14/2009  . OSTEOPOROSIS 11/14/2009  . HIP FRACTURE, LEFT 11/14/2009  . GASTROJEJ ULCR UNS ACUT/CHRN W/O HEMOR PERF/OBST 11/06/2009  . HIATAL HERNIA 10/08/2009    Assessment & Plan: Will refer to Dr. Maisie Fus regarding need for anorectal manometry.  I outlined the various operations for prolapse;  The patient has already educated herself with information on the internet.  She is comfortable with modifying her behavior for now.   Plan:   Appt with Dr.Thomas  For further management and treatment    Matt B. Daphine Deutscher, MD, [facility] [phone] beeper [phone]  12/07/2012 3:01 PM

## 2013-01-04 ENCOUNTER — Ambulatory Visit (INDEPENDENT_AMBULATORY_CARE_PROVIDER_SITE_OTHER): Payer: BC Managed Care – PPO | Admitting: General Surgery

## 2013-01-04 ENCOUNTER — Encounter (INDEPENDENT_AMBULATORY_CARE_PROVIDER_SITE_OTHER): Payer: Self-pay | Admitting: General Surgery

## 2013-01-04 VITALS — BP 130/70 | HR 74 | Resp 16 | Ht 61.0 in | Wt 116.6 lb

## 2013-01-04 DIAGNOSIS — K6289 Other specified diseases of anus and rectum: Secondary | ICD-10-CM

## 2013-01-04 NOTE — Patient Instructions (Addendum)
GETTING TO GOOD BOWEL HEALTH. Irregular bowel habits such as constipation can lead to many problems over time.  Having one soft bowel movement a day is the most important way to prevent further problems.  The anorectal canal is designed to handle stretching and feces to safely manage our ability to get rid of solid waste (feces, poop, stool) out of our body.  BUT, hard constipated stools can act like ripping concrete bricks causing inflamed hemorrhoids, anal fissures, abdominal pain and bloating.     The goal: ONE SOFT BOWEL MOVEMENT A DAY!  To have soft, regular bowel movements:    Drink at least 8 tall glasses of water a day.     Take plenty of fiber.  Fiber is the undigested part of plant food that passes into the colon, acting s "natures broom" to encourage bowel motility and movement.  Fiber can absorb and hold large amounts of water. This results in a larger, bulkier stool, which is soft and easier to pass. Work gradually over several weeks up to 6 servings a day of fiber (25g a day even more if needed) in the form of: o Vegetables -- Root (potatoes, carrots, turnips), leafy green (lettuce, salad greens, celery, spinach), or cooked high residue (cabbage, broccoli, etc) o Fruit -- Fresh (unpeeled skin & pulp), Dried (prunes, apricots, cherries, etc ),  or stewed ( applesauce)  o Whole grain breads, pasta, etc (whole wheat)  o Bran cereals    Bulking Agents -- This type of water-retaining fiber generally is easily obtained each day by one of the following:  o Psyllium bran -- The psyllium plant is remarkable because its ground seeds can retain so much water. This product is available as Metamucil, Konsyl, Effersyllium, Per Diem Fiber, or the less expensive generic preparation in drug and health food stores. Although labeled a laxative, it really is not a laxative.  o Methylcellulose -- This is another fiber derived from wood which also retains water. It is available as Citrucel. o Polyethylene Glycol  - and "artificial" fiber commonly called Miralax or Glycolax.  It is helpful for people with gassy or bloated feelings with regular fiber o Flax Seed - a less gassy fiber than psyllium   No reading or other relaxing activity while on the toilet. If bowel movements take longer than 5 minutes, you are too constipated   AVOID CONSTIPATION.  High fiber and water intake usually takes care of this.  Sometimes a laxative is needed to stimulate more frequent bowel movements, but    Laxatives are not a good long-term solution as it can wear the colon out. o Osmotics (Milk of Magnesia, Fleets phosphosoda, Magnesium citrate, MiraLax, GoLytely) are safer than  o Stimulants (Senokot, Castor Oil, Dulcolax, Ex Lax)    o Do not take laxatives for more than 7days in a row.    IF SEVERELY CONSTIPATED, try a Bowel Retraining Program: o Do not use laxatives.  o Eat a diet high in roughage, such as bran cereals and leafy vegetables.  o Drink six (6) ounces of prune or apricot juice each morning.  o Eat two (2) large servings of stewed fruit each day.  o Take one (1) heaping tablespoon of a psyllium-based bulking agent twice a day. Use sugar-free sweetener when possible to avoid excessive calories.  o Eat a normal breakfast.  o Set aside 15 minutes after breakfast to sit on the toilet, but do not strain to have a bowel movement.  o If you do   not have a bowel movement by the third day, use an enema and repeat the above steps.   

## 2013-01-04 NOTE — Progress Notes (Signed)
Chief Complaint  Patient presents with  . New Evaluation    eval rectal prolapse    HISTORY: [name] is a 62 y.o. female who presents to the office with concern for rectal prolapse.  This had been occurring for about a month.   straining makes the symptoms worse.   It is intermittent in nature.  Her bowel habits are regular and her bowel movements are soft.  Her fiber intake is good.  Her last colonoscopy was about 3-5 years with Dr Christella Hartigan.  She has had 3 vaginal deliveries, one was prolonged labor.  She is not incontinent.  Past Medical History  Diagnosis Date  . 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 COLONIC POLYPS, HX OF 11/15/2009  . DEPRESSION 11/14/2009  . GASTROJEJ ULCR UNS ACUT/CHRN W/O HEMOR PERF/OBST 11/06/2009  . Headache(784.0) 11/15/2009  . HEART MURMUR, HX OF 11/15/2009  . HIATAL HERNIA 10/08/2009  . HIP FRACTURE, LEFT 11/14/2009  . HYPERLIPIDEMIA 11/14/2009  . HYPERTENSION 11/14/2009  . OSTEOPOROSIS 11/14/2009  . WEIGHT GAIN 01/10/2010  . Asthma   . Heart murmur       Past Surgical History  Procedure Laterality Date  . Bleeding ulcers  10/2009  . Broken hip  06/2001  . Back surgery    . Neck surgery  04 & 06    x's 2  . Appendectomy          Current Outpatient Prescriptions  Medication Sig Dispense Refill  . atorvastatin (LIPITOR) 10 MG tablet Take 10 mg by mouth daily.      . Calcium Carbonate-Vitamin D (CALTRATE 600+D) 600-400 MG-UNIT per tablet Take 1 tablet by mouth daily.        . clonazePAM (KLONOPIN) 1 MG tablet       . DULoxetine (CYMBALTA) 60 MG capsule Take 60 mg by mouth daily.        Marland Kitchen lisinopril (PRINIVIL,ZESTRIL) 10 MG tablet Take 10 mg by mouth daily.        Marland Kitchen oxyCODONE-acetaminophen (PERCOCET) 5-325 MG per tablet Take 1 tablet by mouth every 6 (six) hours as needed.        . traMADol (ULTRAM) 50 MG tablet       . traZODone (DESYREL) 50 MG tablet Take 50 mg by mouth at bedtime.        . vitamin B-12 (CYANOCOBALAMIN) 1000 MCG tablet Take 1,000 mcg by  mouth daily.       No current facility-administered medications for this visit.      No Known Allergies    Family History  Problem Relation Age of Onset  . Hypertension Other   . Lung cancer Mother     History   Social History  . Marital Status: Married    Spouse Name: N/A    Number of Children: N/A  . Years of Education: N/A   Social History Main Topics  . Smoking status: Former Smoker    Quit date: 05/04/1982  . 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 Alcohol Use: Yes  . Drug Use: No  . Sexual Activity: None   Other Topics Concern  . None   Social History Narrative  . None      REVIEW OF SYSTEMS - PERTINENT POSITIVES ONLY: Review of Systems - General ROS: negative for - chills, fever or weight loss Hematological and Lymphatic ROS: negative for -  bleeding problems, blood clots or bruising Respiratory ROS: no cough, shortness of breath, or wheezing Cardiovascular ROS: no chest pain or dyspnea on exertion Gastrointestinal ROS: no abdominal pain, change in bowel habits, or black or bloody stools Genito-Urinary ROS: no dysuria, trouble voiding, or hematuria  EXAM: Filed Vitals:   01/04/13 1549  BP: 130/70  Pulse: 74  Resp: 16    General appearance: alert and cooperative Resp: clear to auscultation bilaterally Cardio: regular rate and rhythm GI: normal findings: soft, non-tender   Procedure: Anoscopy Surgeon: Maisie Fus Diagnosis: prolapse  Assistant: Christella Scheuermann After the risks and benefits were explained, verbal consent was obtained for above procedure  Anesthesia: none Findings: R posterior grade 2 hemorrhoid, L lateral grade 2 hemorrhoid, redundant rectal mucosa, good resting tone, min squeeze, good push    ASSESSMENT AND PLAN:  [name] is a 62 y.o. F who I suspect may have rectal prolapse.  She is unable to  reproduce this on exam.  This could also be prolapsing hemorrhoids.  Either way, we will treat conservatively with fiber supplement for now. She is definitely not interested in any surgical correction at this time.  I counseled her on the importance of reducing her prolapse as soon as possible and to get to the ED if this was not possible.  I will see her back in 2 months.  She denies any fecal incontinence at the moment, so we will hold off on manometry for now.     Vanita Panda, MD Colon and Rectal Surgery / General Surgery [facility]      Visit Diagnoses: 1. Rectal discomfort     Primary Care Physician: Juline Patch, MD

## 2013-03-08 ENCOUNTER — Encounter (INDEPENDENT_AMBULATORY_CARE_PROVIDER_SITE_OTHER): Payer: BC Managed Care – PPO | Admitting: General Surgery

## 2013-06-13 ENCOUNTER — Other Ambulatory Visit (HOSPITAL_COMMUNITY): Payer: Self-pay | Admitting: Internal Medicine

## 2013-06-13 DIAGNOSIS — Z1231 Encounter for screening mammogram for malignant neoplasm of breast: Secondary | ICD-10-CM

## 2013-07-17 ENCOUNTER — Ambulatory Visit (HOSPITAL_COMMUNITY)
Admission: RE | Admit: 2013-07-17 | Discharge: 2013-07-17 | Disposition: A | Discharge status: Home / Self Care | Payer: BC Managed Care – PPO | Source: Ambulatory Visit | Attending: Internal Medicine | Admitting: Internal Medicine

## 2013-07-17 DIAGNOSIS — Z1231 Encounter for screening mammogram for malignant neoplasm of breast: Secondary | ICD-10-CM | POA: Insufficient documentation

## 2013-07-19 ENCOUNTER — Other Ambulatory Visit: Payer: Self-pay | Admitting: Registered Nurse

## 2013-07-19 ENCOUNTER — Other Ambulatory Visit (HOSPITAL_COMMUNITY)
Admission: RE | Admit: 2013-07-19 | Discharge: 2013-07-19 | Disposition: A | Discharge status: Home / Self Care | Payer: BC Managed Care – PPO | Source: Ambulatory Visit | Attending: Internal Medicine | Admitting: Internal Medicine

## 2013-07-19 DIAGNOSIS — Z01419 Encounter for gynecological examination (general) (routine) without abnormal findings: Secondary | ICD-10-CM | POA: Insufficient documentation

## 2013-07-19 LAB — HM PAP SMEAR: HM Pap smear: NORMAL

## 2014-04-02 ENCOUNTER — Other Ambulatory Visit: Payer: Self-pay | Admitting: Dermatology

## 2014-06-12 ENCOUNTER — Other Ambulatory Visit: Payer: Self-pay | Admitting: Neurosurgery

## 2014-06-12 DIAGNOSIS — R51 Headache: Principal | ICD-10-CM

## 2014-06-12 DIAGNOSIS — R519 Headache, unspecified: Secondary | ICD-10-CM

## 2014-06-25 ENCOUNTER — Other Ambulatory Visit (HOSPITAL_COMMUNITY): Payer: Self-pay | Admitting: Internal Medicine

## 2014-06-25 DIAGNOSIS — Z1231 Encounter for screening mammogram for malignant neoplasm of breast: Secondary | ICD-10-CM

## 2014-08-02 ENCOUNTER — Ambulatory Visit (HOSPITAL_COMMUNITY)
Admission: RE | Admit: 2014-08-02 | Discharge: 2014-08-02 | Disposition: A | Discharge status: Home / Self Care | Payer: BLUE CROSS/BLUE SHIELD | Source: Ambulatory Visit | Attending: Internal Medicine | Admitting: Internal Medicine

## 2014-08-02 DIAGNOSIS — Z1231 Encounter for screening mammogram for malignant neoplasm of breast: Secondary | ICD-10-CM | POA: Diagnosis not present

## 2014-09-21 ENCOUNTER — Encounter: Payer: Self-pay | Admitting: Gastroenterology

## 2014-10-25 ENCOUNTER — Other Ambulatory Visit: Payer: Self-pay | Admitting: Neurosurgery

## 2014-10-25 DIAGNOSIS — R519 Headache, unspecified: Secondary | ICD-10-CM

## 2014-10-25 DIAGNOSIS — R51 Headache: Principal | ICD-10-CM

## 2014-11-07 ENCOUNTER — Ambulatory Visit
Admission: RE | Admit: 2014-11-07 | Discharge: 2014-11-07 | Disposition: A | Discharge status: Home / Self Care | Payer: PPO | Source: Ambulatory Visit | Attending: Neurosurgery | Admitting: Neurosurgery

## 2014-11-07 DIAGNOSIS — R519 Headache, unspecified: Secondary | ICD-10-CM

## 2014-11-07 DIAGNOSIS — R51 Headache: Principal | ICD-10-CM

## 2014-11-07 MED ORDER — GADOBENATE DIMEGLUMINE 529 MG/ML IV SOLN
10.0000 mL | Freq: Once | INTRAVENOUS | Status: AC | PRN
  Administered 2014-11-07: 10 mL via INTRAVENOUS

## 2015-07-23 DIAGNOSIS — D1801 Hemangioma of skin and subcutaneous tissue: Secondary | ICD-10-CM | POA: Diagnosis not present

## 2015-07-23 DIAGNOSIS — L308 Other specified dermatitis: Secondary | ICD-10-CM | POA: Diagnosis not present

## 2015-07-23 DIAGNOSIS — L821 Other seborrheic keratosis: Secondary | ICD-10-CM | POA: Diagnosis not present

## 2015-07-23 DIAGNOSIS — D225 Melanocytic nevi of trunk: Secondary | ICD-10-CM | POA: Diagnosis not present

## 2015-07-23 DIAGNOSIS — Z85828 Personal history of other malignant neoplasm of skin: Secondary | ICD-10-CM | POA: Diagnosis not present

## 2015-07-23 DIAGNOSIS — D224 Melanocytic nevi of scalp and neck: Secondary | ICD-10-CM | POA: Diagnosis not present

## 2015-07-23 DIAGNOSIS — L814 Other melanin hyperpigmentation: Secondary | ICD-10-CM | POA: Diagnosis not present

## 2015-07-26 ENCOUNTER — Encounter: Payer: Self-pay | Admitting: Gastroenterology

## 2015-08-07 ENCOUNTER — Other Ambulatory Visit: Payer: Self-pay

## 2015-08-07 DIAGNOSIS — Z1231 Encounter for screening mammogram for malignant neoplasm of breast: Secondary | ICD-10-CM

## 2015-08-23 DIAGNOSIS — H6691 Otitis media, unspecified, right ear: Secondary | ICD-10-CM | POA: Diagnosis not present

## 2015-08-23 DIAGNOSIS — H6123 Impacted cerumen, bilateral: Secondary | ICD-10-CM | POA: Diagnosis not present

## 2015-08-28 ENCOUNTER — Ambulatory Visit: Payer: PPO

## 2015-09-02 LAB — HM MAMMOGRAPHY

## 2015-09-05 ENCOUNTER — Ambulatory Visit
Admission: RE | Admit: 2015-09-05 | Discharge: 2015-09-05 | Disposition: A | Discharge status: Home / Self Care | Payer: PPO | Source: Ambulatory Visit

## 2015-09-05 DIAGNOSIS — Z1231 Encounter for screening mammogram for malignant neoplasm of breast: Secondary | ICD-10-CM | POA: Diagnosis not present

## 2015-09-16 DIAGNOSIS — M8580 Other specified disorders of bone density and structure, unspecified site: Secondary | ICD-10-CM | POA: Diagnosis not present

## 2015-09-16 DIAGNOSIS — F419 Anxiety disorder, unspecified: Secondary | ICD-10-CM | POA: Diagnosis not present

## 2015-09-16 DIAGNOSIS — G8929 Other chronic pain: Secondary | ICD-10-CM | POA: Diagnosis not present

## 2015-09-16 DIAGNOSIS — Z5181 Encounter for therapeutic drug level monitoring: Secondary | ICD-10-CM | POA: Diagnosis not present

## 2015-09-16 DIAGNOSIS — G4700 Insomnia, unspecified: Secondary | ICD-10-CM | POA: Diagnosis not present

## 2015-09-16 DIAGNOSIS — F329 Major depressive disorder, single episode, unspecified: Secondary | ICD-10-CM | POA: Diagnosis not present

## 2015-09-23 ENCOUNTER — Encounter (HOSPITAL_COMMUNITY): Payer: Self-pay | Admitting: Emergency Medicine

## 2015-09-23 ENCOUNTER — Emergency Department (HOSPITAL_COMMUNITY)
Admission: EM | Admit: 2015-09-23 | Discharge: 2015-09-23 | Disposition: A | Discharge status: Home / Self Care | Payer: PPO | Attending: Emergency Medicine | Admitting: Emergency Medicine

## 2015-09-23 DIAGNOSIS — Z8781 Personal history of (healed) traumatic fracture: Secondary | ICD-10-CM | POA: Diagnosis not present

## 2015-09-23 DIAGNOSIS — R21 Rash and other nonspecific skin eruption: Secondary | ICD-10-CM | POA: Diagnosis not present

## 2015-09-23 DIAGNOSIS — Z87891 Personal history of nicotine dependence: Secondary | ICD-10-CM | POA: Diagnosis not present

## 2015-09-23 DIAGNOSIS — E785 Hyperlipidemia, unspecified: Secondary | ICD-10-CM | POA: Insufficient documentation

## 2015-09-23 DIAGNOSIS — R0981 Nasal congestion: Secondary | ICD-10-CM | POA: Insufficient documentation

## 2015-09-23 DIAGNOSIS — J45901 Unspecified asthma with (acute) exacerbation: Secondary | ICD-10-CM | POA: Insufficient documentation

## 2015-09-23 DIAGNOSIS — L509 Urticaria, unspecified: Secondary | ICD-10-CM | POA: Diagnosis not present

## 2015-09-23 DIAGNOSIS — T7840XA Allergy, unspecified, initial encounter: Secondary | ICD-10-CM | POA: Diagnosis not present

## 2015-09-23 DIAGNOSIS — R0789 Other chest pain: Secondary | ICD-10-CM | POA: Insufficient documentation

## 2015-09-23 DIAGNOSIS — Z862 Personal history of diseases of the blood and blood-forming organs and certain disorders involving the immune mechanism: Secondary | ICD-10-CM | POA: Insufficient documentation

## 2015-09-23 DIAGNOSIS — F329 Major depressive disorder, single episode, unspecified: Secondary | ICD-10-CM | POA: Insufficient documentation

## 2015-09-23 DIAGNOSIS — L500 Allergic urticaria: Secondary | ICD-10-CM | POA: Insufficient documentation

## 2015-09-23 DIAGNOSIS — Z79899 Other long term (current) drug therapy: Secondary | ICD-10-CM | POA: Insufficient documentation

## 2015-09-23 DIAGNOSIS — R011 Cardiac murmur, unspecified: Secondary | ICD-10-CM | POA: Diagnosis not present

## 2015-09-23 DIAGNOSIS — Z8719 Personal history of other diseases of the digestive system: Secondary | ICD-10-CM | POA: Diagnosis not present

## 2015-09-23 DIAGNOSIS — M810 Age-related osteoporosis without current pathological fracture: Secondary | ICD-10-CM | POA: Insufficient documentation

## 2015-09-23 DIAGNOSIS — Z86010 Personal history of colonic polyps: Secondary | ICD-10-CM | POA: Insufficient documentation

## 2015-09-23 MED ORDER — PREDNISONE 20 MG PO TABS: 60.0000 mg | ORAL_TABLET | Freq: Every day | ORAL | Status: DC

## 2015-09-23 MED ORDER — FAMOTIDINE IN NACL 20-0.9 MG/50ML-% IV SOLN
20.0000 mg | Freq: Once | INTRAVENOUS | Status: AC
  Administered 2015-09-23: 20 mg via INTRAVENOUS
  Filled 2015-09-23: qty 50

## 2015-09-23 MED ORDER — FAMOTIDINE 20 MG PO TABS: 20.0000 mg | ORAL_TABLET | Freq: Two times a day (BID) | ORAL | Status: DC

## 2015-09-23 MED ORDER — SODIUM CHLORIDE 0.9 % IV BOLUS (SEPSIS)
1000.0000 mL | Freq: Once | INTRAVENOUS | Status: AC
  Administered 2015-09-23: 1000 mL via INTRAVENOUS

## 2015-09-23 MED ORDER — DIPHENHYDRAMINE HCL 50 MG/ML IJ SOLN
25.0000 mg | Freq: Once | INTRAMUSCULAR | Status: AC
  Administered 2015-09-23: 25 mg via INTRAVENOUS
  Filled 2015-09-23: qty 1

## 2015-09-23 MED ORDER — METHYLPREDNISOLONE SODIUM SUCC 125 MG IJ SOLR
125.0000 mg | Freq: Once | INTRAMUSCULAR | Status: AC
  Administered 2015-09-23: 125 mg via INTRAVENOUS
  Filled 2015-09-23: qty 2

## 2015-09-23 MED ORDER — DIPHENHYDRAMINE HCL 25 MG PO TABS: 25.0000 mg | ORAL_TABLET | ORAL | Status: DC | PRN

## 2015-09-23 NOTE — ED Notes (Signed)
Pt reports that she woke up this morning with a generalized rash and a scratchy throat. Pt alert x4. NAD at this time.

## 2015-09-23 NOTE — ED Provider Notes (Signed)
CSN: [id number]     Arrival date & time 09/23/15  [id number] History   First MD Initiated Contact with Patient 09/23/15 0913     Chief Complaint  Patient presents with  . Rash     (Consider location/radiation/quality/duration/timing/severity/associated sxs/prior Treatment) HPI  65 year old female presents with a rash and swelling that started when she first woke up at around 7 AM. She went to bed last night at 10 PM with no symptoms. She states that her throat feels a little tight and she has a little bit of dyspnea like her chest is tight. Patient states the rash is all over, primarily on her trunk. She has not had any new medicines or foods that she's never had before. No new soaps. She's been having a little bit of a cold with some sneezing and rhinorrhea over the last 2-3 days. No fevers. She used Sudafed over these last couple days but states she has used this before. No vomiting. She feels like her bilateral hands and around her eyes are swollen. Symptoms have not worsened but have not improved since waking up. Has not tried any medicines. No prior history of allergies. She has not noticed any skin sloughing. She recently got a sunburn and has been peeling for one week, all prior to the rash today.  Past Medical History  Diagnosis Date  . 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 COLONIC POLYPS, HX OF 11/15/2009  . DEPRESSION 11/14/2009  . GASTROJEJ ULCR UNS ACUT/CHRN W/O HEMOR PERF/OBST 11/06/2009  . Headache(784.0) 11/15/2009  . HEART MURMUR, HX OF 11/15/2009  . HIATAL HERNIA 10/08/2009  . HIP FRACTURE, LEFT 11/14/2009  . HYPERLIPIDEMIA 11/14/2009  . HYPERTENSION 11/14/2009  . OSTEOPOROSIS 11/14/2009  . WEIGHT GAIN 01/10/2010  . Asthma   . Heart murmur    Past Surgical History  Procedure Laterality Date  . Bleeding ulcers  10/2009  . Broken hip  06/2001  . Back surgery    . Neck surgery  04 & 06    x's 2  . Appendectomy     Family History  Problem Relation Age of Onset  . Hypertension Other   .  Lung cancer Mother    Social History  Substance Use Topics  . Smoking status: Former Smoker    Quit date: 05/04/1982  . 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 Alcohol Use: Yes   OB History    No data available     Review of Systems  Constitutional: Negative for fever.  HENT: Positive for congestion, facial swelling and sneezing. Negative for sore throat, trouble swallowing and voice change.   Respiratory: Positive for chest tightness and shortness of breath.   Gastrointestinal: Negative for vomiting.  Skin: Positive for rash.  All other systems reviewed and are negative.     Allergies  Review of patient's allergies indicates no known allergies.  Home Medications   Prior to Admission medications   Medication Sig Start Date End Date Taking? Authorizing Provider  atorvastatin (LIPITOR) 10 MG tablet Take 10 mg by mouth daily.    Historical Provider, MD  Calcium Carbonate-Vitamin D (CALTRATE 600+D) 600-400 MG-UNIT per tablet Take 1 tablet by mouth daily.      Historical Provider, MD  clonazePAM Bobbye Charleston) 1 MG tablet  11/12/12   Historical Provider, MD  DULoxetine (CYMBALTA) 60 MG capsule Take 60 mg by mouth daily.  Historical Provider, MD  lisinopril (PRINIVIL,ZESTRIL) 10 MG tablet Take 10 mg by mouth daily.      Historical Provider, MD  oxyCODONE-acetaminophen (PERCOCET) 5-325 MG per tablet Take 1 tablet by mouth every 6 (six) hours as needed.      Historical Provider, MD  traMADol Veatrice Bourbon) 50 MG tablet  11/16/12   Historical Provider, MD  traZODone (DESYREL) 50 MG tablet Take 50 mg by mouth at bedtime.      Historical Provider, MD  vitamin B-12 (CYANOCOBALAMIN) 1000 MCG tablet Take 1,000 mcg by mouth daily.    Historical Provider, MD   BP 142/103 mmHg  Pulse 102  Temp(Src) 98.3 F (36.8 C) (Oral)  Resp 18  Ht 5' 1.5" (1.562 m)  Wt  120 lb (54.432 kg)  BMI 22.31 kg/m2  SpO2 100% Physical Exam  Constitutional: She is oriented to person, place, and time. She appears well-developed and well-nourished.  Resting comfortably, no acute distress. Speaks in complete sentences  HENT:  Head: Normocephalic and atraumatic.  Right Ear: External ear normal.  Left Ear: External ear normal.  Nose: Nose normal.  Mouth/Throat: Uvula is midline and oropharynx is clear and moist. No uvula swelling.  Mild periorbital swelling  Eyes: Right eye exhibits no discharge. Left eye exhibits no discharge.  Neck: Neck supple.  Cardiovascular: Normal rate, regular rhythm and normal heart sounds.   Pulmonary/Chest: Effort normal and breath sounds normal. No stridor. She has no wheezes. She has no rales.  Abdominal: Soft. There is no tenderness.  Musculoskeletal:  Bilateral hands appear mildly swollen, diffuse. Nonpitting  Neurological: She is alert and oriented to person, place, and time.  Skin: Skin is warm and dry. Rash noted.  Diffuse urticarial rash. Large and coalescing in back and abdomen. Intermittent small hives to extremities. No skin sloughing. No oral lesions  Nursing note and vitals reviewed.   ED Course  Procedures (including critical care time) Labs Review Labs Reviewed - No data to display  Imaging Review No results found. I have personally reviewed and evaluated these images and lab results as part of my medical decision-making.   EKG Interpretation None      MDM   Final diagnoses:  Allergic reaction, initial encounter  Hives    Is unclear where the patient's allergic reaction is coming from. Her itching is significantly better after Benadryl and Solu-Medrol. The rash seems to be receding. Her vague throat feeling is gone. This is not seem consistent with anaphylaxis. Unclear where this is coming from but I will discharge her with Benadryl, steroid burst, and recommend follow-up with an allergist. Discussed strict  return precautions.    Sherwood Gambler, MD 09/23/15 1739

## 2015-09-23 NOTE — ED Notes (Signed)
Family at bedside. 

## 2015-10-08 DIAGNOSIS — M4712 Other spondylosis with myelopathy, cervical region: Secondary | ICD-10-CM | POA: Diagnosis not present

## 2015-10-08 DIAGNOSIS — R51 Headache: Secondary | ICD-10-CM | POA: Diagnosis not present

## 2015-10-08 DIAGNOSIS — M47816 Spondylosis without myelopathy or radiculopathy, lumbar region: Secondary | ICD-10-CM | POA: Diagnosis not present

## 2015-10-08 DIAGNOSIS — M5481 Occipital neuralgia: Secondary | ICD-10-CM | POA: Diagnosis not present

## 2015-10-14 DIAGNOSIS — G8929 Other chronic pain: Secondary | ICD-10-CM | POA: Diagnosis not present

## 2015-10-14 DIAGNOSIS — F329 Major depressive disorder, single episode, unspecified: Secondary | ICD-10-CM | POA: Diagnosis not present

## 2015-10-14 DIAGNOSIS — T39391A Poisoning by other nonsteroidal anti-inflammatory drugs [NSAID], accidental (unintentional), initial encounter: Secondary | ICD-10-CM | POA: Diagnosis not present

## 2015-10-14 DIAGNOSIS — G4700 Insomnia, unspecified: Secondary | ICD-10-CM | POA: Diagnosis not present

## 2015-10-14 DIAGNOSIS — F419 Anxiety disorder, unspecified: Secondary | ICD-10-CM | POA: Diagnosis not present

## 2015-10-14 DIAGNOSIS — Z5181 Encounter for therapeutic drug level monitoring: Secondary | ICD-10-CM | POA: Diagnosis not present

## 2015-10-22 ENCOUNTER — Ambulatory Visit (INDEPENDENT_AMBULATORY_CARE_PROVIDER_SITE_OTHER): Payer: PPO | Admitting: Allergy and Immunology

## 2015-10-22 ENCOUNTER — Encounter: Payer: Self-pay | Admitting: Allergy and Immunology

## 2015-10-22 VITALS — BP 126/70 | HR 92 | Temp 98.2°F | Resp 12 | Ht 60.2 in | Wt 124.0 lb

## 2015-10-22 DIAGNOSIS — T7840XD Allergy, unspecified, subsequent encounter: Secondary | ICD-10-CM

## 2015-10-22 DIAGNOSIS — T7840XA Allergy, unspecified, initial encounter: Secondary | ICD-10-CM | POA: Insufficient documentation

## 2015-10-22 MED ORDER — EPINEPHRINE 0.3 MG/0.3ML IJ SOAJ: 0.3000 mg | Freq: Once | INTRAMUSCULAR | Status: DC

## 2015-10-22 NOTE — Patient Instructions (Signed)
Allergic reaction Jubilee's history suggests allergic reaction versus urticaria and angioedema. We were unable to perform skin tests today as she is still in the refractory period after the presumed allergic reaction.  To avoid false negatives, she will return in a few weeks from the time of the reaction for allergy skin testing.  For now, continue meticulous avoidance of peanuts and NSAIDs, including aspirin.  Should symptoms recur, a journal is to be kept recording any foods eaten, beverages consumed, medications taken, activities performed, and environmental conditions within a 6 hour time period prior to the onset of symptoms. For any symptoms concerning for anaphylaxis, epinephrine is to be administered and 911 is to be called immediately.   A prescription has been provided for epinephrine 0.3 mg autoinjector 2 pack along with instructions for its proper administration.  Further recommendations will be made based upon skin test results.   Return in about 4 weeks (around 11/19/2015) for allergy testing.

## 2015-10-22 NOTE — Progress Notes (Signed)
New Patient Note  RE: [name] MRN: [id number] DOB: May 22, 1950 Date of Office Visit: 10/22/2015  Referring provider: Vernie Shanks, MD Primary care provider: Tommy Medal, MD  Chief Complaint: Allergic Reaction; Angioedema; and Urticaria   History of present illness: HPI Comments: [name] is a 65 y.o. female presenting today for consultation of possible allergic reaction.  She reports that 2 weeks ago she woke up in the morning with swelling of the eyelids, lips, and face as well as generalized hives.  She reports minor dyspnea, however she believes that this may have been related to anxiety associated with her other symptoms.  She went to the emergency department for evaluation and treatment.  She was treated with IV corticosteroids and diphenhydramine.  Her symptoms improved and she was released several hours later.  She took diphenhydramine and prednisone over the next 3 days and states that if she missed a dose her symptoms would start to recur.  By the fourth day her symptoms had completely resolved and did not recur despite having discontinued diphenhydramine and prednisone.  Regarding potential triggers, she states that she had consumed chocolate covered peanuts that her husband had brought home for her the night before.  In addition, she had taken an aspirin for a headache the evening before the onset of symptoms.  She typically does not take aspirin because of a history of bleeding ulcers.  She avoids naproxen sodium due to a reaction in the past consisting of generalized urticaria.  She has avoided peanuts and aspirin since the time of the reaction.  She has consumed chocolate without symptoms in the interval since the reaction.   Assessment and plan: Allergic reaction Zahriyah's history suggests allergic reaction versus urticaria and angioedema. We were unable to perform skin tests today as she is still in the refractory period after the presumed allergic reaction.  To  avoid false negatives, she will return in a few weeks from the time of the reaction for allergy skin testing.  For now, continue meticulous avoidance of peanuts and NSAIDs, including aspirin.  Should symptoms recur, a journal is to be kept recording any foods eaten, beverages consumed, medications taken, activities performed, and environmental conditions within a 6 hour time period prior to the onset of symptoms. For any symptoms concerning for anaphylaxis, epinephrine is to be administered and 911 is to be called immediately.   A prescription has been provided for epinephrine 0.3 mg autoinjector 2 pack along with instructions for its proper administration.  Further recommendations will be made based upon skin test results.    Meds ordered this encounter  Medications  . EPINEPHrine 0.3 mg/0.3 mL IJ SOAJ injection    Sig: Inject 0.3 mLs (0.3 mg total) into the muscle once.    Dispense:  1 Device    Refill:  1    Diagnositics: We were unable to perform skin tests today as she is still in the refractory period after the presumed allergic reaction.    Physical examination: Blood pressure 126/70, pulse 92, temperature 98.2 F (36.8 C), temperature source Oral, resp. rate 12, height 5' 0.2" (1.529 m), weight 124 lb (56.246 kg).  General: Alert, interactive, in no acute distress. Lungs: Clear to auscultation without wheezing, rhonchi or rales. CV: Normal S1, S2 without murmurs. Abdomen: Nondistended, nontender. Skin: Warm and dry, without lesions or rashes. Extremities:  No clubbing, cyanosis or edema. Neuro:   Grossly intact.  Review of systems:  Review of Systems  Constitutional: Negative for fever,  chills and weight loss.  HENT: Negative for nosebleeds.   Eyes: Negative for blurred vision.  Respiratory: Positive for shortness of breath. Negative for hemoptysis and wheezing.   Cardiovascular: Negative for chest pain.  Gastrointestinal: Negative for abdominal pain, diarrhea and  constipation.  Genitourinary: Negative for dysuria.  Musculoskeletal: Negative for myalgias and joint pain.  Skin: Positive for rash.  Neurological: Positive for headaches. Negative for dizziness.  Endo/Heme/Allergies: Does not bruise/bleed easily.    Past medical history:  Past Medical History  Diagnosis Date  . 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 COLONIC POLYPS, HX OF 11/15/2009  . DEPRESSION 11/14/2009  . GASTROJEJ ULCR UNS ACUT/CHRN W/O HEMOR PERF/OBST 11/06/2009  . Headache(784.0) 11/15/2009  . HEART MURMUR, HX OF 11/15/2009  . HIATAL HERNIA 10/08/2009  . HIP FRACTURE, LEFT 11/14/2009  . HYPERLIPIDEMIA 11/14/2009  . HYPERTENSION 11/14/2009  . OSTEOPOROSIS 11/14/2009  . WEIGHT GAIN 01/10/2010  . Asthma   . Heart murmur     Past surgical history:  Past Surgical History  Procedure Laterality Date  . Bleeding ulcers  10/2009  . Broken hip  06/2001  . Back surgery    . Neck surgery  04 & 06    x's 2  . Appendectomy      Family history: Family History  Problem Relation Age of Onset  . Hypertension Other   . Bone cancer Mother   . Allergic rhinitis Neg Hx   . Asthma Neg Hx   . Urticaria Neg Hx     Social history: Social History   Social History  . Marital Status: Married    Spouse Name: N/A  . Number of Children: N/A  . Years of Education: N/A   Occupational History  . Not on file.   Social History Main Topics  . Smoking status: Former Smoker    Quit date: 05/04/1982  . 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 Alcohol Use: Yes  . Drug Use: No  . Sexual Activity: Not on file   Other Topics Concern  . Not on file   Social History Narrative   Environmental History: The patient lives in a 66 year old condominium with carpeting in the bedroom, gas heat, and central air.  She is a nonsmoker without pets.    Medication List        This list is accurate as of: 10/22/15  6:17 PM.  Always use your most recent med list.               CALTRATE 600+D 600-400 MG-UNIT tablet  Generic drug:  Calcium Carbonate-Vitamin D  Take 1 tablet by mouth daily.     clonazePAM 1 MG tablet  Commonly known as:  KLONOPIN  Take 1 mg by mouth 3 (three) times daily as needed for anxiety.     DULoxetine 30 MG capsule  Commonly known as:  CYMBALTA  Take 90 mg by mouth daily.     DULoxetine 60 MG capsule  Commonly known as:  CYMBALTA  Take 60 mg by mouth daily.     EPINEPHrine 0.3 mg/0.3 mL Soaj injection  Commonly known as:  EPI-PEN  Inject 0.3 mLs (0.3 mg total) into the muscle once.     MULTIVITAMIN PO  Take by mouth.     oxyCODONE-acetaminophen 5-325 MG tablet  Commonly known as:  PERCOCET/ROXICET  Take 1 tablet by mouth every  6 (six) hours as needed for moderate pain.     traMADol 50 MG tablet  Commonly known as:  ULTRAM  Take 50 mg by mouth every 12 (twelve) hours as needed for moderate pain.     traZODone 50 MG tablet  Commonly known as:  DESYREL  Take 50 mg by mouth at bedtime as needed for sleep.     vitamin B-12 1000 MCG tablet  Commonly known as:  CYANOCOBALAMIN  Take 1,000 mcg by mouth daily.        Known medication allergies: No Known Allergies  I appreciate the opportunity to take part in Otha's care. Please do not hesitate to contact me with questions.  Sincerely,   R. Edgar Frisk, MD

## 2015-10-22 NOTE — Addendum Note (Signed)
Addended by: Golda Acre C on: 10/22/2015 06:20 PM   Modules accepted: Level of Service

## 2015-10-22 NOTE — Assessment & Plan Note (Addendum)
[name]'s history suggests allergic reaction versus urticaria and angioedema. We were unable to perform skin tests today as she is still in the refractory period after the presumed allergic reaction.  To avoid false negatives, she will return in a few weeks from the time of the reaction for allergy skin testing.  For now, continue meticulous avoidance of peanuts and NSAIDs, including aspirin.  Should symptoms recur, a journal is to be kept recording any foods eaten, beverages consumed, medications taken, activities performed, and environmental conditions within a 6 hour time period prior to the onset of symptoms. For any symptoms concerning for anaphylaxis, epinephrine is to be administered and 911 is to be called immediately.   A prescription has been provided for epinephrine 0.3 mg autoinjector 2 pack along with instructions for its proper administration.  Further recommendations will be made based upon skin test results.

## 2015-10-25 ENCOUNTER — Other Ambulatory Visit: Payer: Self-pay

## 2015-10-25 MED ORDER — EPINEPHRINE 0.3 MG/0.3ML IJ SOAJ: 0.3000 mg | Freq: Once | INTRAMUSCULAR | Status: DC

## 2015-11-19 ENCOUNTER — Ambulatory Visit: Payer: PPO | Admitting: Allergy and Immunology

## 2015-12-04 DIAGNOSIS — I10 Essential (primary) hypertension: Secondary | ICD-10-CM | POA: Diagnosis not present

## 2015-12-04 DIAGNOSIS — J0100 Acute maxillary sinusitis, unspecified: Secondary | ICD-10-CM | POA: Diagnosis not present

## 2015-12-20 DIAGNOSIS — Z5181 Encounter for therapeutic drug level monitoring: Secondary | ICD-10-CM | POA: Diagnosis not present

## 2015-12-20 DIAGNOSIS — G8929 Other chronic pain: Secondary | ICD-10-CM | POA: Diagnosis not present

## 2015-12-20 DIAGNOSIS — Z23 Encounter for immunization: Secondary | ICD-10-CM | POA: Diagnosis not present

## 2015-12-20 DIAGNOSIS — Z1159 Encounter for screening for other viral diseases: Secondary | ICD-10-CM | POA: Diagnosis not present

## 2015-12-20 DIAGNOSIS — M859 Disorder of bone density and structure, unspecified: Secondary | ICD-10-CM | POA: Diagnosis not present

## 2015-12-20 DIAGNOSIS — Z0000 Encounter for general adult medical examination without abnormal findings: Secondary | ICD-10-CM | POA: Diagnosis not present

## 2015-12-20 DIAGNOSIS — Z889 Allergy status to unspecified drugs, medicaments and biological substances status: Secondary | ICD-10-CM | POA: Diagnosis not present

## 2015-12-20 DIAGNOSIS — R739 Hyperglycemia, unspecified: Secondary | ICD-10-CM | POA: Diagnosis not present

## 2015-12-20 DIAGNOSIS — G43909 Migraine, unspecified, not intractable, without status migrainosus: Secondary | ICD-10-CM | POA: Diagnosis not present

## 2015-12-20 DIAGNOSIS — G4700 Insomnia, unspecified: Secondary | ICD-10-CM | POA: Diagnosis not present

## 2015-12-20 DIAGNOSIS — E785 Hyperlipidemia, unspecified: Secondary | ICD-10-CM | POA: Diagnosis not present

## 2015-12-20 DIAGNOSIS — M8580 Other specified disorders of bone density and structure, unspecified site: Secondary | ICD-10-CM | POA: Diagnosis not present

## 2016-01-09 ENCOUNTER — Encounter: Payer: Self-pay | Admitting: Neurology

## 2016-01-09 ENCOUNTER — Ambulatory Visit (INDEPENDENT_AMBULATORY_CARE_PROVIDER_SITE_OTHER): Payer: PPO | Admitting: Neurology

## 2016-01-09 VITALS — BP 162/96 | HR 77 | Ht 61.0 in | Wt 126.6 lb

## 2016-01-09 DIAGNOSIS — G43709 Chronic migraine without aura, not intractable, without status migrainosus: Secondary | ICD-10-CM | POA: Diagnosis not present

## 2016-01-09 MED ORDER — SUMATRIPTAN SUCCINATE 100 MG PO TABS: 100.0000 mg | ORAL_TABLET | Freq: Once | ORAL | 12 refills | Status: DC | PRN

## 2016-01-09 MED ORDER — ONDANSETRON 4 MG PO TBDP: 4.0000 mg | ORAL_TABLET | Freq: Three times a day (TID) | ORAL | 12 refills | Status: DC | PRN

## 2016-01-09 MED ORDER — DIVALPROEX SODIUM ER 500 MG PO TB24: 500.0000 mg | ORAL_TABLET | Freq: Every day | ORAL | 12 refills | Status: DC

## 2016-01-09 NOTE — Patient Instructions (Addendum)
Remember to drink plenty of fluid, eat healthy meals and do not skip any meals. Try to eat protein with a every meal and eat a healthy snack such as fruit or nuts in between meals. Try to keep a regular sleep-wake schedule and try to exercise daily, particularly in the form of walking, 20-30 minutes a day, if you can.   As far as your medications are concerned, I would like to suggest: Depakote in the evenings.  Imitrex at onset of migraine: Please take one tablet at the onset of your headache. If it does not improve the symptoms please take one additional tablet. Do not take more then 2 tablets in 24hrs. Do not take use more then 2 to 3 days in a week. Take with zofran.   I would like to see you back for botox, sooner if we need to. Please call us with any interim questions, concerns, problems, updates or refill requests.   Our phone number is [phone]. We also have an after hours call service for urgent matters and there is a physician on-call for urgent questions. For any emergencies you know to call 911 or go to the nearest emergency roomOndansetron oral dissolving tablet What is this medicine? ONDANSETRON (on DAN se tron) is used to treat nausea and vomiting caused by chemotherapy. It is also used to prevent or treat nausea and vomiting after surgery. This medicine may be used for other purposes; ask your health care provider or pharmacist if you have questions. What should I tell my health care provider before I take this medicine? They need to know if you have any of these conditions: -heart disease -history of irregular heartbeat -liver disease -low levels of magnesium or potassium in the blood -an unusual or allergic reaction to ondansetron, granisetron, other medicines, foods, dyes, or preservatives -pregnant or trying to get pregnant -breast-feeding How should I use this medicine? These tablets are made to dissolve in the mouth. Do not try to push the tablet through the foil  backing. With dry hands, peel away the foil backing and gently remove the tablet. Place the tablet in the mouth and allow it to dissolve, then swallow. While you may take these tablets with water, it is not necessary to do so. Talk to your pediatrician regarding the use of this medicine in children. Special care may be needed. Overdosage: If you think you have taken too much of this medicine contact a poison control center or emergency room at once. NOTE: This medicine is only for you. Do not share this medicine with others. What if I miss a dose? If you miss a dose, take it as soon as you can. If it is almost time for your next dose, take only that dose. Do not take double or extra doses. What may interact with this medicine? Do not take this medicine with any of the following medications: -apomorphine -certain medicines for fungal infections like fluconazole, itraconazole, ketoconazole, posaconazole, voriconazole -cisapride -dofetilide -dronedarone -pimozide -thioridazine -ziprasidone This medicine may also interact with the following medications: -carbamazepine -certain medicines for depression, anxiety, or psychotic disturbances -fentanyl -linezolid -MAOIs like Carbex, Eldepryl, Marplan, Nardil, and Parnate -methylene blue (injected into a vein) -other medicines that prolong the QT interval (cause an abnormal heart rhythm) -phenytoin -rifampicin -tramadol This list may not describe all possible interactions. Give your health care provider a list of all the medicines, herbs, non-prescription drugs, or dietary supplements you use. Also tell them if you smoke, drink alcohol, or use illegal  drugs. Some items may interact with your medicine. What should I watch for while using this medicine? Check with your doctor or health care professional as soon as you can if you have any sign of an allergic reaction. What side effects may I notice from receiving this medicine? Side effects that you  should report to your doctor or health care professional as soon as possible: -allergic reactions like skin rash, itching or hives, swelling of the face, lips, or tongue -breathing problems -confusion -dizziness -fast or irregular heartbeat -feeling faint or lightheaded, falls -fever and chills -loss of balance or coordination -seizures -sweating -swelling of the hands and feet -tightness in the chest -tremors -unusually weak or tired Side effects that usually do not require medical attention (report to your doctor or health care professional if they continue or are bothersome): -constipation or diarrhea -headache This list may not describe all possible side effects. Call your doctor for medical advice about side effects. You may report side effects to FDA at 1-800-FDA-1088. Where should I keep my medicine? Keep out of the reach of children. Store between 2 and 30 degrees C (36 and 86 degrees F). Throw away any unused medicine after the expiration date. NOTE: This sheet is a summary. It may not cover all possible information. If you have questions about this medicine, talk to your doctor, pharmacist, or health care provider.    2016, [employer]. (2013-01-25 16:21:52)  Sumatriptan tablets What is this medicine? SUMATRIPTAN (soo ma TRIP tan) is used to treat migraines with or without aura. An aura is a strange feeling or visual disturbance that warns you of an attack. It is not used to prevent migraines. This medicine may be used for other purposes; ask your health care provider or pharmacist if you have questions. What should I tell my health care provider before I take this medicine? They need to know if you have any of these conditions: -circulation problems in fingers and toes -diabetes -heart disease -high blood pressure -high cholesterol -history of irregular heartbeat -history of stroke -kidney disease -liver disease -postmenopausal or surgical removal of  uterus and ovaries -seizures -smoke tobacco -stomach or intestine problems -an unusual or allergic reaction to sumatriptan, other medicines, foods, dyes, or preservatives -pregnant or trying to get pregnant -breast-feeding How should I use this medicine? Take this medicine by mouth with a glass of water. Follow the directions on the prescription label. This medicine is taken at the first symptoms of a migraine. It is not for everyday use. If your migraine headache returns after one dose, you can take another dose as directed. You must leave at least 2 hours between doses, and do not take more than 100 mg as a single dose. Do not take more than 200 mg total in any 24 hour period. If there is no improvement at all after the first dose, do not take a second dose without talking to your doctor or health care professional. Do not take your medicine more often than directed. Talk to your pediatrician regarding the use of this medicine in children. Special care may be needed. Overdosage: If you think you have taken too much of this medicine contact a poison control center or emergency room at once. NOTE: This medicine is only for you. Do not share this medicine with others. What if I miss a dose? This does not apply; this medicine is not for regular use. What may interact with this medicine? Do not take this medicine with any of  the following medicines: -cocaine -ergot alkaloids like dihydroergotamine, ergonovine, ergotamine, methylergonovine -feverfew -MAOIs like Carbex, Eldepryl, Marplan, Nardil, and Parnate -other medicines for migraine headache like almotriptan, eletriptan, frovatriptan, naratriptan, rizatriptan, zolmitriptan -tryptophan This medicine may also interact with the following medications: -certain medicines for depression, anxiety, or psychotic disturbances This list may not describe all possible interactions. Give your health care provider a list of all the medicines, herbs,  non-prescription drugs, or dietary supplements you use. Also tell them if you smoke, drink alcohol, or use illegal drugs. Some items may interact with your medicine. What should I watch for while using this medicine? Only take this medicine for a migraine headache. Take it if you get warning symptoms or at the start of a migraine attack. It is not for regular use to prevent migraine attacks. You may get drowsy or dizzy. Do not drive, use machinery, or do anything that needs mental alertness until you know how this medicine affects you. To reduce dizzy or fainting spells, do not sit or stand up quickly, especially if you are an older patient. Alcohol can increase drowsiness, dizziness and flushing. Avoid alcoholic drinks. Smoking cigarettes may increase the risk of heart-related side effects from using this medicine. If you take migraine medicines for 10 or more days a month, your migraines may get worse. Keep a diary of headache days and medicine use. Contact your healthcare professional if your migraine attacks occur more frequently. What side effects may I notice from receiving this medicine? Side effects that you should report to your doctor or health care professional as soon as possible: -allergic reactions like skin rash, itching or hives, swelling of the face, lips, or tongue -bloody or watery diarrhea -hallucination, loss of contact with reality -pain, tingling, numbness in the face, hands, or feet -seizures -signs and symptoms of a blood clot such as breathing problems; changes in vision; chest pain; severe, sudden headache; pain, swelling, warmth in the leg; trouble speaking; sudden numbness or weakness of the face, arm, or leg -signs and symptoms of a dangerous change in heartbeat or heart rhythm like chest pain; dizziness; fast or irregular heartbeat; palpitations, feeling faint or lightheaded; falls; breathing problems -signs and symptoms of a stroke like changes in vision; confusion;  trouble speaking or understanding; severe headaches; sudden numbness or weakness of the face, arm, or leg; trouble walking; dizziness; loss of balance or coordination -stomach pain Side effects that usually do not require medical attention (report these to your doctor or health care professional if they continue or are bothersome): -changes in taste -facial flushing -headache -muscle cramps -muscle pain -nausea, vomiting -weak or tired This list may not describe all possible side effects. Call your doctor for medical advice about side effects. You may report side effects to FDA at 1-800-FDA-1088. Where should I keep my medicine? Keep out of the reach of children. Store at room temperature between 2 and 30 degrees C (36 and 86 degrees F). Throw away any unused medicine after the expiration date. NOTE: This sheet is a summary. It may not cover all possible information. If you have questions about this medicine, talk to your doctor, pharmacist, or health care provider.    2016, [employer]. (2014-10-25 17:46:40)  Valproic Acid, Divalproex Sodium delayed or extended-release tablets What is this medicine? DIVALPROEX SODIUM (dye VAL pro ex SO dee um) is used to prevent seizures caused by some forms of epilepsy. It is also used to treat bipolar mania and to prevent migraine headaches. This medicine may  be used for other purposes; ask your health care provider or pharmacist if you have questions. What should I tell my health care provider before I take this medicine? They need to know if you have any of these conditions: -blood disease -brain damage or disease -kidney disease -liver disease -low blood proteins -mitochondrial disease -suicidal thoughts, plans, or attempt; a previous suicide attempt by you or a family member -urea cycle disorder (UCD) -an unusual or allergic reaction to divalproex sodium, other medicines, foods, dyes, or preservatives -pregnant or trying to get  pregnant -breast-feeding How should I use this medicine? Take this medicine by mouth with a drink of water. Follow the directions on the prescription label. Do not crush or chew. If this medicine upsets your stomach, take it with food or milk. Take your medicine at regular intervals. Do not take it more often than directed. Talk to your pediatrician regarding the use of this medicine in children. Special care may be needed. Overdosage: If you think you have taken too much of this medicine contact a poison control center or emergency room at once. NOTE: This medicine is only for you. Do not share this medicine with others. What if I miss a dose? If you miss a dose, take it as soon as you can. If it is almost time for your next dose, take only that dose. Do not take double or extra doses. What may interact with this medicine? -aspirin -barbiturates, like phenobarbital -diazepam -isoniazid -medicines for depression, anxiety, or psychotic disturbances -medicines that treat or prevent blood clots like warfarin -meropenem -other seizure medicines -rifampin -tolbutamide -zidovudine This list may not describe all possible interactions. Give your health care provider a list of all the medicines, herbs, non-prescription drugs, or dietary supplements you use. Also tell them if you smoke, drink alcohol, or use illegal drugs. Some items may interact with your medicine. What should I watch for while using this medicine? Visit your doctor or health care professional for regular checks on your progress. If you are taking this medicine to treat epilepsy (seizures), do not stop taking it suddenly. This increases the risk of seizures. Wear a medical identification bracelet or chain to say you have epilepsy or seizures, and carry a card that lists all your medicines. You may get drowsy, dizzy, or have blurred vision. Do not drive, use machinery, or do anything that needs mental alertness until you know how this  medicine affects you. To reduce dizzy or fainting spells, do not sit or stand up quickly, especially if you are an older patient. Alcohol can increase drowsiness and dizziness. Avoid alcoholic drinks. This medicine can cause blood problems. This can mean slow healing and a risk of infection. Problems can arise if you need dental work, and in the day to day care of your teeth. Try to avoid damage to your teeth and gums when you brush or floss your teeth. This medicine can make you more sensitive to the sun. Keep out of the sun. If you cannot avoid being in the sun, wear protective clothing and use sunscreen. Do not use sun lamps or tanning beds/booths. The use of this medicine may increase the chance of suicidal thoughts or actions. Pay special attention to how you are responding while on this medicine. Any worsening of mood, or thoughts of suicide or dying should be reported to your health care professional right away. Women who become pregnant while using this medicine may enroll in the [place] Pregnancy Registry by  calling [phone]. This registry collects information about the safety of antiepileptic drug use during pregnancy. Contact your doctor or healthcare professional if you notice any part of your medicine in your stool. Your healthcare provider may want to check the amount of medicine in your blood if this happens. What side effects may I notice from receiving this medicine? Side effects that you should report to your doctor or health care professional as soon as possible: -allergic reactions like skin rash, itching or hives, swelling of the face, lips, or tongue -changes in the frequency or severity of seizures -double vision or uncontrollable eye movements -nausea and vomiting -redness, blistering, peeling or loosening of the skin, including inside the mouth -stomach pain or cramps -trembling of hands or arms -unusual bleeding or bruising or pinpoint red  spots on the skin -unusual swelling of the arms or legs -unusually weak or tired -worsening of mood, thoughts or actions of suicide or dying -yellowing of skin or eyes Side effects that usually do not require medical attention (report to your doctor or health care professional if they continue or are bothersome): -change in menstrual cycle -diarrhea or constipation -headache -loss of bladder control -loss of hair or unusual growth of hair -loss or increase in appetite -weight gain or loss This list may not describe all possible side effects. Call your doctor for medical advice about side effects. You may report side effects to FDA at 1-800-FDA-1088. Where should I keep my medicine? Keep out of reach of children. Store at room temperature between 15 and 30 degrees C (59 and 86 degrees F). Keep container tightly closed. Throw away any unused medicine after the expiration date. NOTE: This sheet is a summary. It may not cover all possible information. If you have questions about this medicine, talk to your doctor, pharmacist, or health care provider.    2016, [employer] Standard. (2011-12-15 11:50:42)

## 2016-01-09 NOTE — Progress Notes (Signed)
[facility]    Provider:  Dr Jaynee Eagles Referring Provider: Tommy Medal, MD Primary Care Physician:  Tommy Medal, MD  CC:  Chronic migraines  HPI:  [name] is a 66 y.o. female here as a referral from Dr. Minna Antis for migraines. Past medical history of anxiety, depression, insomnia, osteoporosis, chronic pain, multiple allergies, NSAID-induced gastritis, hypertension, hyperlipidemia, migraine. She has had "stress headaches" all her life denies migraines. Sister and mother with migraine. Last year she started having new headaches shooting pain in her head all over and MRI was negative. Dr. Francesco Runner did injections to try and alleviate the headaches. The shooting pain started in the back of the head on the left but now gets shooting pain all over the head including the forehead. Last year in February she woke up with severe headache an went to an acupuncture. For the last 7 months she has had migraines that are refractory to medications, she cannot stand light or noise, had a terrible one in February. Her stress headaches are pressure in the temples and across the forehead. The migraines  Have woken her up, with light sensitivity, sound sensitivity, nausea, she vomits, she has started waking up with headaches progressively worsening in the morning she wakes up once a month or every 3 weeks with a headache and they can be debilitating. She has headaches every day tension type no light or sound sensitivity just pressure around the temples and forehead. The migraines that wake her up kept her at home all day. She continues to have shooting pain and tension tyope headaches and lots of stress in the neck. She does not take OTC medication, no medication overuse. Excedrin helped when she had the migraine. She has 8-10 migraine days a mont, migraines can  last up to 24 hours with daily 30/30 days a month of headachea. No medication overuse. No aura.   Failed: cymbalta(anti-depressant),  topamax(anti-epileptic) this year, had side effects to topamax. Takes imitrex acutely. Tried zofran for nausea. Took lisinopril in the past which did not help her headaches (blood pressure medication)  Reviewed notes, labs and imaging from outside physicians, which showed:   Hemoglobin A1c 5. 12/22/2015, BUN 18 and creatinine 0.69 in May 2017, TSH 1.8 in April 2016.  Personally reviewed MRI of the brain images and agree with the following 11/07/2014: No evidence for acute infarction, hemorrhage, mass lesion, hydrocephalus, or extra-axial fluid. Normal for age cerebral volume. No significant white matter disease. Flow voids are maintained throughout the carotid, basilar, and vertebral arteries. There are no areas of chronic hemorrhage.  Pituitary and cerebellar tonsils unremarkable. Previous cervical fusion extends cephalad is pars C3. Mild disc space narrowing C2-C3 and mild pannus without neural impingement.  Post infusion, no abnormal enhancement of the brain or meninges.  Incompletely evaluated on this routine brain study is a T2 hyperintense lesion within the LEFT paramedian pituitary. This measures approximately 5 mm in size. It appears fairly cystic, and is unchanged from 2011. 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 If there is evidence for elevated prolactin, MR of the pituitary should be performed.  Visualized calvarium, skull base, and upper cervical osseous structures unremarkable. Scalp and extracranial soft tissues, orbits, sinuses, and mastoids show no acute process.  IMPRESSION: Stable MR brain.  No acute intracranial findings.  Previous C3 through C7 cervical fusion incompletely evaluated but could serve as a source of headaches. Correlate clinically.  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  Review of Systems: Patient complains of symptoms per HPI as well as the  following symptoms: Easy bruising . Pertinent negatives per HPI. All others negative.   Social History   Social History  . Marital status: Married    Spouse name: [name]  . Number of children: 3  . Years of education: BA   Occupational History  . [employer]    Social History Main Topics  . Smoking status: Former Smoker    Quit date: 05/04/1982  . 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 Alcohol use Yes  . Drug use: No  . Sexual activity: Not on file   Other Topics Concern  . Not on file   Social History Narrative   Lives with husband   Caffeine use: 1 cup or less     Family History  Problem Relation Age of Onset  . Bone cancer Mother   . Hypertension Other   . Allergic rhinitis Neg Hx   . Asthma Neg Hx   . Urticaria Neg Hx     Past Medical History:  Diagnosis Date  . 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 Asthma   . COLONIC POLYPS, HX OF 11/15/2009  . DEPRESSION 11/14/2009  . GASTROJEJ ULCR UNS ACUT/CHRN W/O HEMOR PERF/OBST 11/06/2009  . Headache(784.0) 11/15/2009  . Heart murmur   . HEART MURMUR, HX OF 11/15/2009  . HIATAL HERNIA 10/08/2009  . HIP FRACTURE, LEFT 11/14/2009  . HYPERLIPIDEMIA 11/14/2009  . HYPERTENSION 11/14/2009  . OSTEOPOROSIS 11/14/2009  . WEIGHT GAIN 01/10/2010    Past Surgical History:  Procedure Laterality Date  . APPENDECTOMY    . BACK SURGERY    . Bleeding ulcers  10/2009  . Broken Hip  06/2001  . NECK SURGERY  04 & 06   x's 2    Current Outpatient Prescriptions  Medication Sig Dispense Refill  . Calcium Carbonate-Vitamin D (CALTRATE 600+D) 600-400 MG-UNIT per tablet Take 1 tablet by mouth daily.      . clonazePAM (KLONOPIN) 1 MG tablet Take 1 mg by mouth 3 (three) times daily as needed for anxiety.     . DULoxetine (CYMBALTA) 60 MG capsule Take 90 mg by mouth daily.     Marland Kitchen EPINEPHrine (AUVI-Q) 0.3  mg/0.3 mL IJ SOAJ injection Inject 0.3 mLs (0.3 mg total) into the muscle once. 2 Device 1  . EPINEPHrine 0.3 mg/0.3 mL IJ SOAJ injection Inject 0.3 mLs (0.3 mg total) into the muscle once. 1 Device 1  . Multiple Vitamins-Minerals (MULTIVITAMIN PO) Take by mouth.    . oxyCODONE-acetaminophen (PERCOCET) 5-325 MG per tablet Take 1 tablet by mouth every 6 (six) hours as needed for moderate pain.     . traMADol (ULTRAM) 50 MG tablet Take 50 mg by mouth every 12 (twelve) hours as needed for moderate pain.     . traZODone (DESYREL) 50 MG tablet Take 50 mg by mouth at bedtime as needed for sleep.     . vitamin B-12 (CYANOCOBALAMIN) 1000 MCG tablet Take 1,000 mcg by mouth daily.    . divalproex (DEPAKOTE ER) 500 MG 24 hr tablet Take 1 tablet (500 mg total) by mouth daily. 30 tablet 12  . SUMAtriptan (IMITREX) 100 MG tablet Take 1 tablet (100 mg total) by mouth once as needed for migraine. May repeat in 2 hours if headache persists or  recurs. 10 tablet 12   No current facility-administered medications for this visit.     Allergies as of 01/09/2016  . (No Known Allergies)    Vitals: BP (!) 162/96 (BP Location: Right Arm, Patient Position: Sitting, Cuff Size: Normal)   Pulse 77   Ht 5\' 1"  (1.549 m)   Wt 126 lb 9.6 oz (57.4 kg)   BMI 23.92 kg/m  Last Weight:  Wt Readings from Last 1 Encounters:  01/09/16 126 lb 9.6 oz (57.4 kg)   Last Height:   Ht Readings from Last 1 Encounters:  01/09/16 5\' 1"  (1.549 m)   Physical exam: Exam: Gen: NAD, conversant                  CV: RRR, no MRG. No Carotid Bruits. No peripheral edema, warm, nontender Eyes: Conjunctivae clear without exudates or hemorrhage  Neuro: Detailed Neurologic Exam  Speech:    Speech is normal; fluent and spontaneous with normal comprehension.  Cognition:    The patient is oriented to person, place, and time;     recent and remote memory intact;     language fluent;     normal attention, concentration,     fund of  knowledge Cranial Nerves:    The pupils are equal, round, and reactive to light. The fundi are normal and spontaneous venous pulsations are present. Visual fields are full to finger confrontation. Extraocular movements are intact. Trigeminal sensation is intact and the muscles of mastication are normal. The face is symmetric. The palate elevates in the midline. Hearing intact. Voice is normal. Shoulder shrug is normal. The tongue has normal motion without fasciculations.   Coordination:    Normal finger to nose and heel to shin. Normal rapid alternating movements.   Gait:    Heel-toe and tandem gait are normal.   Motor Observation:    No asymmetry, no atrophy, and no involuntary movements noted. Tone:    Normal muscle tone.    Posture:    Posture is normal. normal erect    Strength:    Strength is V/V in the upper and lower limbs.      Sensation: intact to LT     Reflex Exam:  DTR's:    Deep tendon reflexes in the upper and lower extremities are normal bilaterally.   Toes:    The toes are downgoing bilaterally.   Clonus:    Clonus is absent.       Assessment/Plan:  65 year old with chronic migraines without aura no status not intractable   Remember to drink plenty of fluid, eat healthy meals and do not skip any meals. Try to eat protein with a every meal and eat a healthy snack such as fruit or nuts in between meals. Try to keep a regular sleep-wake schedule and try to exercise daily, particularly in the form of walking, 20-30 minutes a day, if you can.   As far as your medications are concerned, I would like to suggest: Depakote in the evenings.  Imitrex at onset of migraine: Please take one tablet at the onset of your headache. If it does not improve the symptoms please take one additional tablet. Do not take more then 2 tablets in 24hrs. Do not take use more then 2 to 3 days in a week. Take with zofran.   I would like to see you back for botox, sooner if we need to.  Please call us with any interim questions, concerns, problems, updates or refill requests.  Discussed side effects as per patient instructions.   Discussed: To prevent or relieve headaches, try the following: Cool Compress. Lie down and place a cool compress on your head.  Avoid headache triggers. If certain foods or odors seem to have triggered your migraines in the past, avoid them. A headache diary might help you identify triggers.  Include physical activity in your daily routine. Try a daily walk or other moderate aerobic exercise.  Manage stress. Find healthy ways to cope with the stressors, such as delegating tasks on your to-do list.  Practice relaxation techniques. Try deep breathing, yoga, massage and visualization.  Eat regularly. Eating regularly scheduled meals and maintaining a healthy diet might help prevent headaches. Also, drink plenty of fluids.  Follow a regular sleep schedule. Sleep deprivation might contribute to headaches Consider biofeedback. With this mind-body technique, you learn to control certain bodily functions - such as muscle tension, heart rate and blood pressure - to prevent headaches or reduce headache pain.    Proceed to emergency room if you experience new or worsening symptoms or symptoms do not resolve, if you have new neurologic symptoms or if headache is severe, or for any concerning symptom.    Sarina Ill, MD  [facility] [address] [place], [place] [address]  Phone [phone] Fax [phone]

## 2016-01-12 DIAGNOSIS — G43709 Chronic migraine without aura, not intractable, without status migrainosus: Secondary | ICD-10-CM | POA: Insufficient documentation

## 2016-01-16 ENCOUNTER — Telehealth: Payer: Self-pay | Admitting: Neurology

## 2016-01-16 NOTE — Telephone Encounter (Signed)
LVM returning pt call.  Per Dr Jaynee Eagles, she can take another dose. She is to not take more than 2 tablets in 24 hr or 2-3 doses in a week. Gave [facility] phone number if she has further questions.

## 2016-01-16 NOTE — Telephone Encounter (Signed)
Patient called requesting to speak with nurse regarding medication Dr. Jaynee Eagles put her on last week SUMAtriptan (IMITREX) 100 MG tablet, took 1 pill by itself on 1st day (Saturday), (instructions were to take 2nd pill 2 hours later if still having migraine) then took 2 pills on Tuesday (1st pill 4pm, 2nd pill 6pm then 7pm no headache). Has headache and wants to know if okay to take. Please call to advise.

## 2016-01-22 ENCOUNTER — Telehealth: Payer: Self-pay | Admitting: Neurology

## 2016-01-22 NOTE — Telephone Encounter (Signed)
error 

## 2016-01-22 NOTE — Telephone Encounter (Signed)
[facility] calling about benefits.

## 2016-02-04 ENCOUNTER — Telehealth: Payer: Self-pay | Admitting: Neurology

## 2016-02-04 NOTE — Telephone Encounter (Signed)
[facility] called requesting a call back  [phone], option 4 , option 2

## 2016-02-06 ENCOUNTER — Ambulatory Visit (INDEPENDENT_AMBULATORY_CARE_PROVIDER_SITE_OTHER): Payer: PPO | Admitting: Neurology

## 2016-02-06 VITALS — BP 166/92 | HR 92

## 2016-02-06 DIAGNOSIS — G43709 Chronic migraine without aura, not intractable, without status migrainosus: Secondary | ICD-10-CM

## 2016-02-06 NOTE — Progress Notes (Signed)
Botox-100unitsx2 vials- [facility] stock Lot: [id number] Expiration: 08/2018 NDC: 0023/1145-01 [id number]  0.9% Sodium Chloride- 64mL total [id number] Expiration: 09/2017 NDC: [id number]  BUY AND BILL

## 2016-02-06 NOTE — Progress Notes (Signed)

## 2016-02-07 NOTE — Telephone Encounter (Signed)
[name] would you mind calling her back?

## 2016-02-18 NOTE — Telephone Encounter (Signed)
Spoke to them and they needed insurance information. Relayed information.

## 2016-03-10 ENCOUNTER — Telehealth: Payer: Self-pay | Admitting: Neurology

## 2016-03-10 ENCOUNTER — Other Ambulatory Visit: Payer: Self-pay | Admitting: Neurology

## 2016-03-10 MED ORDER — DIVALPROEX SODIUM ER 250 MG PO TB24: ORAL_TABLET | ORAL | 3 refills | Status: DC

## 2016-03-10 NOTE — Telephone Encounter (Signed)
Dr Jaynee Eagles- please advise. I do not see mention of dosage increase in your notes.

## 2016-03-10 NOTE — Telephone Encounter (Signed)
I called in a 250mg  pill. She should take it along with her 500mg  at night for a total of 750mg  thanks

## 2016-03-10 NOTE — Telephone Encounter (Signed)
Patient is calling to discuss as in her previous visit increasing dosage of divalproex (DEPAKOTE ER) 500 MG 24 hr tablet to 750mg . Please call and discuss. The [facility] [phone].

## 2016-03-11 MED ORDER — DIVALPROEX SODIUM ER 250 MG PO TB24: ORAL_TABLET | ORAL | 3 refills | Status: DC

## 2016-03-11 NOTE — Telephone Encounter (Signed)
Called and spoke to pt. Relayed per AA,MD she should take 250mg  pill with 500mg  pill to equal total 750mg  at night. Advised [facility] [place], [place]. She verbalized understanding and has no questions at this time.

## 2016-03-11 NOTE — Addendum Note (Signed)
Addended by: Hope Pigeon on: 03/11/2016 08:58 AM   Modules accepted: Orders

## 2016-04-17 DIAGNOSIS — M4712 Other spondylosis with myelopathy, cervical region: Secondary | ICD-10-CM | POA: Diagnosis not present

## 2016-04-17 DIAGNOSIS — M47816 Spondylosis without myelopathy or radiculopathy, lumbar region: Secondary | ICD-10-CM | POA: Diagnosis not present

## 2016-05-04 DIAGNOSIS — H269 Unspecified cataract: Secondary | ICD-10-CM

## 2016-05-04 HISTORY — DX: Unspecified cataract: H26.9

## 2016-05-07 ENCOUNTER — Ambulatory Visit: Payer: PPO | Admitting: Neurology

## 2016-05-14 ENCOUNTER — Ambulatory Visit (INDEPENDENT_AMBULATORY_CARE_PROVIDER_SITE_OTHER): Payer: PPO | Admitting: Neurology

## 2016-05-14 VITALS — BP 171/91 | HR 93 | Ht 61.0 in

## 2016-05-14 DIAGNOSIS — G43709 Chronic migraine without aura, not intractable, without status migrainosus: Secondary | ICD-10-CM

## 2016-05-14 NOTE — Progress Notes (Signed)
Botox-100unitsx2 vials Lot: [id number] Expiration: 11/2018 NDC: [id number]  0.9% Sodium Chloride bacteriostatic- 76mL total Lot: [id number] Expiration: 10/02/2017 NDC: [id number]  Dx: [id number] B/B

## 2016-05-14 NOTE — Progress Notes (Signed)

## 2016-05-15 ENCOUNTER — Encounter: Payer: Self-pay | Admitting: Neurology

## 2016-05-20 ENCOUNTER — Ambulatory Visit: Payer: PPO | Admitting: Primary Care

## 2016-05-26 ENCOUNTER — Ambulatory Visit (INDEPENDENT_AMBULATORY_CARE_PROVIDER_SITE_OTHER): Payer: PPO | Admitting: Primary Care

## 2016-05-26 ENCOUNTER — Encounter: Payer: Self-pay | Admitting: Primary Care

## 2016-05-26 VITALS — BP 144/96 | HR 82 | Temp 97.9°F | Ht 60.5 in | Wt 125.0 lb

## 2016-05-26 DIAGNOSIS — E785 Hyperlipidemia, unspecified: Secondary | ICD-10-CM | POA: Diagnosis not present

## 2016-05-26 DIAGNOSIS — I10 Essential (primary) hypertension: Secondary | ICD-10-CM

## 2016-05-26 DIAGNOSIS — G8929 Other chronic pain: Secondary | ICD-10-CM | POA: Insufficient documentation

## 2016-05-26 DIAGNOSIS — G8921 Chronic pain due to trauma: Secondary | ICD-10-CM

## 2016-05-26 DIAGNOSIS — F329 Major depressive disorder, single episode, unspecified: Secondary | ICD-10-CM

## 2016-05-26 DIAGNOSIS — G43709 Chronic migraine without aura, not intractable, without status migrainosus: Secondary | ICD-10-CM | POA: Diagnosis not present

## 2016-05-26 DIAGNOSIS — F418 Other specified anxiety disorders: Secondary | ICD-10-CM | POA: Diagnosis not present

## 2016-05-26 DIAGNOSIS — F419 Anxiety disorder, unspecified: Principal | ICD-10-CM

## 2016-05-26 DIAGNOSIS — F32A Depression, unspecified: Secondary | ICD-10-CM

## 2016-05-26 MED ORDER — LISINOPRIL 10 MG PO TABS: 10.0000 mg | ORAL_TABLET | Freq: Every day | ORAL | 1 refills | Status: DC

## 2016-05-26 MED ORDER — HYDROXYZINE HCL 25 MG PO TABS: ORAL_TABLET | ORAL | 1 refills | Status: DC

## 2016-05-26 MED ORDER — HYDROXYZINE HCL 25 MG PO TABS: ORAL_TABLET | ORAL | 0 refills | Status: DC

## 2016-05-26 NOTE — Assessment & Plan Note (Signed)
Will obtain records for previous labs. Repeat this summer during annual physical.

## 2016-05-26 NOTE — Progress Notes (Signed)
Subjective:    Patient ID: [name], female    DOB: 08-Feb-1951, 66 y.o.   MRN: [id number]  HPI  [name] is a 66 year old female who presents today to establish care and discuss the problems mentioned below. Will obtain old records. Her last physical was in Summer of 2017.   1) Essential Hypertension: Prior history of hypertension, which improved with weight loss. She's since regained weight of 10 pounds. She's been checking her BP at home and is getting readings of 140-160's/90's-100's. Her BP in the office today is 144/96. She denies chest pain, dizziness, visual changes.   2) Chronic Migraines: Currently managed at [facility]. She completes Botox injections with significant improvement. Completed MRI of the brain in 2016. Currently managed Depakote daily and Imitrex as needed. Overall she feels well managed.  3) Chronic Pain: Currently managed on Percocet as needed. She follows with Dr. Carloyn Manner with Neurosurgery every 6 months. History of chronic neck, back, and hip pain with multiple surgeries.   4) Anxiety and Depression: Currently managed on Cymbalta 60 mg and feels well managed. Also managed on Clonazepam 1 mg twice daily that she takes everyday. She's taken her Clonazepam twice daily, routinely, for 10 years. She also takes Trazodone at bedtime for insomnia. She ran out of her Clonazepam 1 month ago and was without it for 3 days. This caused increased anxiety. 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   Review of Systems  Eyes: Negative for visual disturbance.  Respiratory: Negative for shortness of breath.   Cardiovascular: Negative for chest pain.  Neurological: Positive for headaches. Negative for dizziness.  Psychiatric/Behavioral: Negative for sleep disturbance. The patient is nervous/anxious.        Past Medical History:  Diagnosis Date  . ANEMIA-IRON DEFICIENCY  11/14/2009  . Asthma   . COLONIC POLYPS, HX OF 11/15/2009  . DEPRESSION 11/14/2009  . GASTROJEJ ULCR UNS ACUT/CHRN W/O HEMOR PERF/OBST 11/06/2009  . Headache(784.0) 11/15/2009  . Heart murmur   . HEART MURMUR, HX OF 11/15/2009  . HIATAL HERNIA 10/08/2009  . HIP FRACTURE, LEFT 11/14/2009  . HYPERLIPIDEMIA 11/14/2009  . HYPERTENSION 11/14/2009  . OSTEOPOROSIS 11/14/2009  . WEIGHT GAIN 01/10/2010     Social History   Social History  . Marital status: Married    Spouse name: [name]  . Number of children: 3  . Years of education: BA   Occupational History  . [employer]    Social History Main Topics  . Smoking status: Former Smoker    Quit date: 05/04/1982  . 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 Alcohol use Yes  . Drug use: No  . Sexual activity: Not on file   Other Topics Concern  . Not on file   Social History Narrative   Lives with husband   Caffeine use: 1 cup or less     Past Surgical History:  Procedure Laterality Date  . APPENDECTOMY    . BACK SURGERY    . Bleeding ulcers  10/2009  . Broken Hip  06/2001  . NECK SURGERY  04 & 06   x's 2    Family History  Problem Relation Age of Onset  . Bone cancer Mother   . Hypertension Other   .  Allergic rhinitis Neg Hx   . Asthma Neg Hx   . Urticaria Neg Hx     No Known Allergies  Current Outpatient Prescriptions on File Prior to Visit  Medication Sig Dispense Refill  . Calcium Carbonate-Vitamin D (CALTRATE 600+D) 600-400 MG-UNIT per tablet Take 1 tablet by mouth daily.      . clonazePAM (KLONOPIN) 1 MG tablet Take 1 mg by mouth 2 (two) times daily as needed for anxiety.     . divalproex (DEPAKOTE ER) 250 MG 24 hr tablet Take 750mg  at night (500mg  +250mg ). 90 tablet 3  . divalproex (DEPAKOTE ER) 500 MG 24 hr tablet Take 1 tablet (500 mg total) by mouth daily. 30 tablet 12  .  DULoxetine (CYMBALTA) 60 MG capsule Take 90 mg by mouth daily.     . Multiple Vitamins-Minerals (MULTIVITAMIN PO) Take by mouth.    . oxyCODONE-acetaminophen (PERCOCET) 5-325 MG per tablet Take 1 tablet by mouth every 6 (six) hours as needed for moderate pain.     . SUMAtriptan (IMITREX) 100 MG tablet Take 1 tablet (100 mg total) by mouth once as needed for migraine. May repeat in 2 hours if headache persists or recurs. 10 tablet 12  . traMADol (ULTRAM) 50 MG tablet Take 50 mg by mouth every 12 (twelve) hours as needed for moderate pain.     . traZODone (DESYREL) 50 MG tablet Take 50 mg by mouth at bedtime as needed for sleep.      No current facility-administered medications on file prior to visit.     BP (!) 144/96   Pulse 82   Temp 97.9 F (36.6 C) (Oral)   Ht 5' 0.5" (1.537 m)   Wt 125 lb (56.7 kg)   SpO2 98%   BMI 24.01 kg/m    Objective:   Physical Exam  Constitutional: She appears well-nourished.  Neck: Neck supple.  Cardiovascular: Normal rate and regular rhythm.   Pulmonary/Chest: Effort normal and breath sounds normal.  Skin: Skin is warm and dry.  Psychiatric: She has a normal mood and affect.          Assessment & Plan:

## 2016-05-26 NOTE — Patient Instructions (Signed)
To wean off Clonazepam:  Take 1/2 tablet daily for 1 week, then 1/2 tablet daily every other day for 8 days, then stop.  You may try the hydroxyzine during the first week of dose reduction of clonazepam. The hydroxyzine can cause drowsiness.  Start Lisinopril 10 mg tablets for high blood pressure. Take 1 tablet by mouth once daily.  Continue to monitor your blood pressure, record your readings, bring your readings to your follow up visit in 3 weeks.  It was a pleasure to meet you today! Please don't hesitate to call me with any questions. Welcome to [facility]!

## 2016-05-26 NOTE — Assessment & Plan Note (Signed)
Above goal in the office today, also on home readings. Given history of hypertension and migraines with weight gain will need to restart medication. Rx for Lisinopril 10 mg sent to pharmacy. Follow up in 3 weeks for BP check and BMP.

## 2016-05-26 NOTE — Assessment & Plan Note (Signed)
Following with Neurology, much improved on botox injections. Stable on Depakote daily and Imitrex PRN.

## 2016-05-26 NOTE — Assessment & Plan Note (Signed)
Long history. Managed on Cymbalta 60 mg and Clonazepam daily. Discussed long term effects of benzo use including addiction and discouraged routine use. She would like to try to wean down. Will have her wean slowly and try hydroxyzine PRN for breakthrough anxiety. If she cannot wean down then discussed that she will need an annual UDS and controlled substance contract.

## 2016-05-26 NOTE — Progress Notes (Signed)
Pre visit review using our clinic review tool, if applicable. No additional management support is needed unless otherwise documented below in the visit note. 

## 2016-05-26 NOTE — Assessment & Plan Note (Signed)
Managed on Percocet PRN per neurosurgery. Numerous surgeries. She will need to continue this prescription per her neurosurgeon.

## 2016-05-29 ENCOUNTER — Encounter: Payer: Self-pay | Admitting: Primary Care

## 2016-06-16 ENCOUNTER — Encounter: Payer: Self-pay | Admitting: Primary Care

## 2016-06-16 ENCOUNTER — Ambulatory Visit (INDEPENDENT_AMBULATORY_CARE_PROVIDER_SITE_OTHER): Payer: PPO | Admitting: Primary Care

## 2016-06-16 VITALS — BP 152/94 | HR 72 | Temp 98.3°F | Ht 60.5 in | Wt 127.0 lb

## 2016-06-16 DIAGNOSIS — F411 Generalized anxiety disorder: Secondary | ICD-10-CM

## 2016-06-16 DIAGNOSIS — I10 Essential (primary) hypertension: Secondary | ICD-10-CM

## 2016-06-16 DIAGNOSIS — F419 Anxiety disorder, unspecified: Secondary | ICD-10-CM

## 2016-06-16 DIAGNOSIS — F329 Major depressive disorder, single episode, unspecified: Secondary | ICD-10-CM

## 2016-06-16 DIAGNOSIS — F418 Other specified anxiety disorders: Secondary | ICD-10-CM | POA: Diagnosis not present

## 2016-06-16 LAB — BASIC METABOLIC PANEL
BUN: 13 mg/dL (ref 6–23)
CALCIUM: 9.3 mg/dL (ref 8.4–10.5)
CO2: 29 meq/L (ref 19–32)
Chloride: 106 mEq/L (ref 96–112)
Creatinine, Ser: 0.64 mg/dL (ref 0.40–1.20)
GFR: 98.46 mL/min (ref >60.00)
GLUCOSE: 95 mg/dL (ref 70–99)
Potassium: 3.9 mEq/L (ref 3.5–5.1)
Sodium: 140 mEq/L (ref 135–145)

## 2016-06-16 MED ORDER — BUSPIRONE HCL 7.5 MG PO TABS: 7.5000 mg | ORAL_TABLET | Freq: Two times a day (BID) | ORAL | 1 refills | Status: DC

## 2016-06-16 MED ORDER — LISINOPRIL-HYDROCHLOROTHIAZIDE 20-12.5 MG PO TABS: 1.0000 | ORAL_TABLET | Freq: Every day | ORAL | 3 refills | Status: DC

## 2016-06-16 NOTE — Progress Notes (Signed)
Pre visit review using our clinic review tool, if applicable. No additional management support is needed unless otherwise documented below in the visit note. 

## 2016-06-16 NOTE — Assessment & Plan Note (Signed)
Stopped Clonazepam without weaning as discussed, no improvement with hydroxyzine. She is motivated to come off of Clonazepam, will help her to slowly wean off, discussed that this is important. Will add in Buspar 7.5 BID, continue Cymbalta. Will continue to monitor.

## 2016-06-16 NOTE — Assessment & Plan Note (Signed)
No improvement on lisinopril 10 mg based off of home readings and office reading today. Will change to lisinopril/hctz 20/12.5 and have her continue to monitor BP at home. Will call and check on readings in 2 weeks. BMP today unremarkable.

## 2016-06-16 NOTE — Progress Notes (Signed)
Subjective:    Patient ID: [name], female    DOB: 1951/04/17, 66 y.o.   MRN: [id number]  HPI  [name] is a 66 year old female who presents today for follow up.  1) Essential Hypertension: She established as a new patient three weeks ago and was noted to have high blood pressure. She had a prior history of hypertension and was able to reduce her blood pressure with diet and exercise. She was initiated on lisinopril 10 mg last visit and asked to follow up with home readings.  She's been checking her BP at home with readings of 150-180's/80's-100's. She's compliant to her Lisinopril.   2) GAD: Managed on Clonazepam for 15 years. Last visit we discussed long term effects of this medications and its addictive nature. We trialed her on Hydroxyzine and decided to wean her off of Clonazepam. She decided to stop her Clonazepam suddenly and felt no improvement in anxiety with hydroxyzine. She is also managed on Cymbalta 60 mg. She's been taking her Clonazepam twice daily which she began again on 06/06/16.   She is motivated to wean off of Clonazepam as she is aware of the long term effects and the addictive nature.    Review of Systems  Eyes: Negative for visual disturbance.  Respiratory: Negative for shortness of breath.   Cardiovascular: Negative for chest pain.  Neurological: Negative for dizziness and headaches.  Psychiatric/Behavioral: The patient is nervous/anxious.        Past Medical History:  Diagnosis Date  . 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 Asthma   . COLONIC POLYPS, HX OF 11/15/2009  . DEPRESSION 11/14/2009  . GASTROJEJ ULCR UNS ACUT/CHRN W/O HEMOR PERF/OBST 11/06/2009  . Headache(784.0) 11/15/2009  . Heart murmur   . HEART MURMUR, HX OF 11/15/2009  . HIATAL HERNIA 10/08/2009  . HIP FRACTURE, LEFT 11/14/2009  . HYPERLIPIDEMIA 11/14/2009  . HYPERTENSION 11/14/2009  . OSTEOPOROSIS 11/14/2009  . WEIGHT GAIN 01/10/2010     Social History   Social History  .  Marital status: Married    Spouse name: [name]  . Number of children: 3  . Years of education: BA   Occupational History  . [employer]    Social History Main Topics  . Smoking status: Former Smoker    Quit date: 05/04/1982  . 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 Alcohol use Yes  . Drug use: No  . Sexual activity: Not on file   Other Topics Concern  . Not on file   Social History Narrative   Lives with husband   Caffeine use: 1 cup or less     Past Surgical History:  Procedure Laterality Date  . APPENDECTOMY    . BACK SURGERY    . Bleeding ulcers  10/2009  . Broken Hip  06/2001  . NECK SURGERY  04 & 06   x's 2    Family History  Problem Relation Age of Onset  . Bone cancer Mother   . Hypertension Other   . Allergic rhinitis Neg Hx   . Asthma Neg Hx   . Urticaria Neg Hx     No Known Allergies  Current Outpatient Prescriptions on File Prior to Visit  Medication Sig Dispense Refill  . divalproex (DEPAKOTE ER) 250 MG 24 hr tablet Take 750mg  at night (500mg  +250mg ). 90 tablet 3  .  divalproex (DEPAKOTE ER) 500 MG 24 hr tablet Take 1 tablet (500 mg total) by mouth daily. 30 tablet 12  . DULoxetine (CYMBALTA) 60 MG capsule Take 90 mg by mouth daily.     Marland Kitchen lisinopril (PRINIVIL,ZESTRIL) 10 MG tablet Take 1 tablet (10 mg total) by mouth daily. 30 tablet 1  . Multiple Vitamins-Minerals (MULTIVITAMIN PO) Take by mouth.    . oxyCODONE-acetaminophen (PERCOCET) 5-325 MG per tablet Take 1 tablet by mouth every 6 (six) hours as needed for moderate pain.     . SUMAtriptan (IMITREX) 100 MG tablet Take 1 tablet (100 mg total) by mouth once as needed for migraine. May repeat in 2 hours if headache persists or recurs. 10 tablet 12  . traMADol (ULTRAM) 50 MG tablet Take 50 mg by mouth every 12 (twelve) hours as needed for moderate pain.     .  traZODone (DESYREL) 50 MG tablet Take 50 mg by mouth at bedtime as needed for sleep.     . clonazePAM (KLONOPIN) 1 MG tablet Take 1 mg by mouth 2 (two) times daily as needed for anxiety.     . hydrOXYzine (ATARAX/VISTARIL) 25 MG tablet Take 1 tablet by mouth twice daily as needed for anxiety. (Patient not taking: Reported on 06/16/2016) 30 tablet 1   No current facility-administered medications on file prior to visit.     BP (!) 152/94   Pulse 72   Temp 98.3 F (36.8 C) (Oral)   Ht 5' 0.5" (1.537 m)   Wt 127 lb (57.6 kg)   SpO2 99%   BMI 24.39 kg/m    Objective:   Physical Exam  Constitutional: She appears well-nourished.  Neck: Neck supple.  Cardiovascular: Normal rate and regular rhythm.   Pulmonary/Chest: Effort normal and breath sounds normal.  Skin: Skin is warm and dry.  Psychiatric: She has a normal mood and affect.          Assessment & Plan:

## 2016-06-16 NOTE — Patient Instructions (Signed)
Complete lab work prior to leaving today. I will notify you of your results once received.   Start lisinopril/hctz 20/12.5 mg for high blood pressure. Take 1 tablet by mouth once daily. Do not take Lisinopril 10 mg tablets.   Start buspirone (Buspar) 7.5 mg tablets everyday for anxiety. Take 1 tablet by mouth twice daily. Continue Cymbalta 60 mg.  Slowly wean off of your Clonazepam. Start by taking 1/2 tablet by mouth twice daily for 2 weeks, then reduce to 1/2 tablet by mouth once daily for 2 weeks, then stop.   We will give you a call in 2 weeks to check on your blood pressure readings and anxiety.  It was a pleasure to see you today!

## 2016-06-30 ENCOUNTER — Telehealth: Payer: Self-pay | Admitting: Primary Care

## 2016-06-30 DIAGNOSIS — F411 Generalized anxiety disorder: Secondary | ICD-10-CM

## 2016-06-30 NOTE — Telephone Encounter (Signed)
Message left for patient to return my call.  

## 2016-06-30 NOTE — Telephone Encounter (Addendum)
-----   Message from Pleas Koch, NP sent at 06/16/2016 10:33 AM EST ----- Regarding: BP and Anxiety Please check on patient's blood pressure and anxiety.  We changed her BP meds to lisinopril/hctz 20/12.5 and added in Buspar. Weaning off Clonazepam. How's she doing weaning off Clonazepam?

## 2016-07-01 ENCOUNTER — Encounter: Payer: Self-pay | Admitting: Primary Care

## 2016-07-01 ENCOUNTER — Encounter: Payer: Self-pay | Admitting: Neurology

## 2016-07-01 ENCOUNTER — Other Ambulatory Visit: Payer: Self-pay | Admitting: Primary Care

## 2016-07-01 DIAGNOSIS — F411 Generalized anxiety disorder: Secondary | ICD-10-CM

## 2016-07-01 MED ORDER — BUSPIRONE HCL 15 MG PO TABS: 15.0000 mg | ORAL_TABLET | Freq: Two times a day (BID) | ORAL | 1 refills | Status: DC

## 2016-07-01 NOTE — Telephone Encounter (Signed)
Spoken and notified patient of Kate's comments. Patient verbalized understanding.  [facility] stated [facility]

## 2016-07-01 NOTE — Telephone Encounter (Signed)
Spoken to patient. She stated that her blood pressure has been okay. Blood pressure reading has been around 123/79, 172/105, 150/88.  Patient is done weaning off the clonazepam. However, patient stated is still not feeling well. Chest still feels tight. Patient is concern that Buspar is not helping enough as it should. Right now, it is the calm time at work so she is worry when the busy season starts.

## 2016-07-01 NOTE — Telephone Encounter (Signed)
Noted, Rx sent to [facility].

## 2016-07-01 NOTE — Telephone Encounter (Signed)
Lets increase her dose of Buspar as she's on a low dose. I want her to take 2 tablets by mouth twice daily until her current bottle is empty, I'll send in a prescription for Buspar 15 mg. When she picks that up she'll take just 1 tablet twice daily. We will call her for an update in 3 weeks. Which pharmacy should I send the Rx to?

## 2016-07-06 ENCOUNTER — Other Ambulatory Visit: Payer: Self-pay | Admitting: Primary Care

## 2016-07-06 ENCOUNTER — Encounter: Payer: Self-pay | Admitting: Primary Care

## 2016-07-06 DIAGNOSIS — G4700 Insomnia, unspecified: Secondary | ICD-10-CM

## 2016-07-06 MED ORDER — TRAZODONE HCL 50 MG PO TABS: 50.0000 mg | ORAL_TABLET | Freq: Every evening | ORAL | 3 refills | Status: DC | PRN

## 2016-07-07 ENCOUNTER — Other Ambulatory Visit: Payer: Self-pay | Admitting: Primary Care

## 2016-07-07 DIAGNOSIS — F411 Generalized anxiety disorder: Secondary | ICD-10-CM

## 2016-07-07 MED ORDER — CLONAZEPAM 0.5 MG PO TABS: ORAL_TABLET | ORAL | 0 refills | Status: DC

## 2016-07-07 NOTE — Progress Notes (Unsigned)
[facility]

## 2016-07-13 ENCOUNTER — Encounter: Payer: Self-pay | Admitting: Primary Care

## 2016-07-14 ENCOUNTER — Other Ambulatory Visit: Payer: Self-pay | Admitting: Primary Care

## 2016-07-14 ENCOUNTER — Encounter: Payer: Self-pay | Admitting: Primary Care

## 2016-07-15 ENCOUNTER — Other Ambulatory Visit: Payer: Self-pay | Admitting: Primary Care

## 2016-07-15 NOTE — Telephone Encounter (Signed)
See recent My Chart message and get both of them scheduled for follow up. She will need a 30 minute follow up for anxiety.  Thanks.

## 2016-07-16 ENCOUNTER — Other Ambulatory Visit: Payer: Self-pay | Admitting: Neurology

## 2016-07-16 NOTE — Telephone Encounter (Addendum)
[facility] Rx w/ 1 yr refills was sent in 01/09/16. Pt has no remaining refills per pharmacy. Appears that she's been filling triptan rx about twice per month. She had last Botox in Jan and has next treatment scheduled in April. She's also been prescribed Depakote in the past.

## 2016-07-16 NOTE — Telephone Encounter (Signed)
Patient called office requesting refill for SUMAtriptan (IMITREX) 100 MG tablet.  [facility] [address].

## 2016-07-19 ENCOUNTER — Other Ambulatory Visit: Payer: Self-pay | Admitting: Neurology

## 2016-07-19 DIAGNOSIS — G43709 Chronic migraine without aura, not intractable, without status migrainosus: Secondary | ICD-10-CM

## 2016-07-19 MED ORDER — SUMATRIPTAN SUCCINATE 100 MG PO TABS: 100.0000 mg | ORAL_TABLET | Freq: Once | ORAL | 12 refills | Status: DC

## 2016-07-22 ENCOUNTER — Encounter: Payer: Self-pay | Admitting: Primary Care

## 2016-07-28 ENCOUNTER — Other Ambulatory Visit: Payer: Self-pay | Admitting: Primary Care

## 2016-07-28 DIAGNOSIS — I10 Essential (primary) hypertension: Secondary | ICD-10-CM

## 2016-07-28 DIAGNOSIS — F411 Generalized anxiety disorder: Secondary | ICD-10-CM

## 2016-07-28 MED ORDER — BUSPIRONE HCL 15 MG PO TABS: 15.0000 mg | ORAL_TABLET | Freq: Two times a day (BID) | ORAL | 3 refills | Status: DC

## 2016-07-28 MED ORDER — LISINOPRIL 10 MG PO TABS: 10.0000 mg | ORAL_TABLET | Freq: Every day | ORAL | 3 refills | Status: DC

## 2016-07-28 NOTE — Telephone Encounter (Addendum)
Received faxed refill request for lisinopril 10 mg and buspirone 15 mgfor 90 days supply. Last seen on 06/16/2016.

## 2016-08-03 ENCOUNTER — Encounter: Payer: Self-pay | Admitting: Primary Care

## 2016-08-03 ENCOUNTER — Other Ambulatory Visit: Payer: Self-pay | Admitting: Primary Care

## 2016-08-03 ENCOUNTER — Telehealth: Payer: Self-pay | Admitting: Neurology

## 2016-08-03 DIAGNOSIS — G43709 Chronic migraine without aura, not intractable, without status migrainosus: Secondary | ICD-10-CM

## 2016-08-03 MED ORDER — DIVALPROEX SODIUM ER 500 MG PO TB24: 1500.0000 mg | ORAL_TABLET | Freq: Every day | ORAL | 3 refills | Status: DC

## 2016-08-03 NOTE — Telephone Encounter (Signed)
I spoke to pt, she is out of her imitrex.  (she has been taking on a daily basis (one tab usually).  She states that she is ok until about 12-1pm, she has not nausea, head pain is different everyday. Her eyes hurt.  Her last dose of imitrex was this last Friday.  She does not feel like her depakote has helped (she started taking pm but has now been taking in am to see if made a difference).  She is not taking trazadone, and hte klonopin in now at 0.5mg  daily.  She as appt next month 08-17-16 for botox. (which was great first time and second time was was not as effective).  She does not take tramadol, but has takes percocet as needed.  I relayed that Dr. Jaynee Eagles out until 08-10-16, she can call her pcp.  She wanted me to send message to [facility].   Reccomendations.?

## 2016-08-03 NOTE — Telephone Encounter (Signed)
Patient would like a call back regarding migraines. best call back is [phone]

## 2016-08-03 NOTE — Telephone Encounter (Signed)
I called patient. She continues to have chronic daily headaches. She is on a relatively low dose of Depakote taking 750 mg daily. The patient will go to 1000 mg a day for one week, and then she will go to 1500 mg daily. She will be seeing Dr. Jaynee Eagles in 3 weeks. She is getting Botox without complete benefit.

## 2016-08-17 ENCOUNTER — Telehealth: Payer: Self-pay

## 2016-08-17 ENCOUNTER — Ambulatory Visit: Payer: PPO | Admitting: Neurology

## 2016-08-17 NOTE — Telephone Encounter (Signed)
Today's Botox appt r/s to Thurs afternoon d/t no power @ [facility].

## 2016-08-20 ENCOUNTER — Ambulatory Visit: Payer: Self-pay | Admitting: Neurology

## 2016-09-03 ENCOUNTER — Ambulatory Visit (INDEPENDENT_AMBULATORY_CARE_PROVIDER_SITE_OTHER): Payer: PPO | Admitting: Neurology

## 2016-09-03 VITALS — BP 147/97 | HR 96 | Ht 61.0 in | Wt 125.2 lb

## 2016-09-03 DIAGNOSIS — G43709 Chronic migraine without aura, not intractable, without status migrainosus: Secondary | ICD-10-CM

## 2016-09-03 NOTE — Progress Notes (Addendum)
Botox 100 units/vial x 2 vials from office supply (B/B) NDC 9324-1991-44 Lot [id number] Exp 11 2020  Diluted in 4 ml of Bacteriostatic 0.9% NaCl NDC 0757-3225-67 Lot [id number] Exp [id number]  Topical pain reliever applied to injection areas w/ gauze Lidocaine 5% cream NDC 21798-102-54 Lot [id number] Exp 06/19

## 2016-09-03 NOTE — Progress Notes (Signed)
Consent Form Botulism Toxin Injection For Chronic Migraine  Botulism toxin has been approved by the Federal drug administration for treatment of chronic migraine. Botulism toxin does not cure chronic migraine and it may not be effective in some patients.  The administration of botulism toxin is accomplished by injecting a small amount of toxin into the muscles of the neck and head. Dosage must be titrated for each individual. Any benefits resulting from botulism toxin tend to wear off after 3 months with a repeat injection required if benefit is to be maintained. Injections are usually done every 3-4 months with maximum effect peak achieved by about 2 or 3 weeks. Botulism toxin is expensive and you should be sure of what costs you will incur resulting from the injection.  The side effects of botulism toxin use for chronic migraine may include:   -Transient, and usually mild, facial weakness with facial injections  -Transient, and usually mild, head or neck weakness with head/neck injections  -Reduction or loss of forehead facial animation due to forehead muscle              weakness  -Eyelid drooping  -Dry eye  -Pain at the site of injection or bruising at the site of injection  -Double vision  -Potential unknown long term risks  Contraindications: You should not have Botox if you are pregnant, nursing, allergic to albumin, have an infection, skin condition, or muscle weakness at the site of the injection, or have myasthenia gravis, Lambert-Eaton syndrome, or ALS.  It is also possible that as with any injection, there may be an allergic reaction or no effect from the medication. Reduced effectiveness after repeated injections is sometimes seen and rarely infection at the injection site may occur. All care will be taken to prevent these side effects. If therapy is given over a long time, atrophy and wasting in the muscle injected may occur. Occasionally the patient's become refractory to  treatment because they develop antibodies to the toxin. In this event, therapy needs to be modified.  I have read the above information and consent to the administration of botulism toxin.    ______________  _____   _________________  Patient signature     Date   Witness signature       BOTOX PROCEDURE NOTE FOR MIGRAINE HEADACHE    Contraindications and precautions discussed with patient(above). Aseptic procedure was observed and patient tolerated procedure. Procedure performed by Dr. Georgia Dom  The condition has existed for more than 6 months, and pt does not have a diagnosis of ALS, Myasthenia Gravis or Lambert-Eaton Syndrome. Risks and benefits of injections discussed and pt agrees to proceed with the procedure. Written consent obtained  These injections are medically necessary. He receives good benefits from these injections. These injections do not cause sedations or hallucinations which the oral therapies may cause.  Indication/Diagnosis: chronic migraine BOTOX(J0585) injection was performed according to protocol by [employer]. 200 units of BOTOX was dissolved into 4 cc NS.  NDC: [phone]  Type of toxin: Botox  Botox 100 units/vial x 2 vials from office supply (B/B) NDC 0347-4259-56 Lot [id number] Exp 11 2020  Diluted in 4 ml of Bacteriostatic 0.9% NaCl NDC 3295-1884-16 Lot [id number] Exp [id number]  Topical pain reliever applied to injection areas w/ gauze Lidocaine 5% cream NDC 10932-355-73 Lot [id number] Exp 06/19  Description of procedure:  The patient was placed in a sitting position. The standard protocol was used for Botox as follows, with 5 units of Botox injected at  each site:   -Procerus muscle, midline injection  -Corrugator muscle, bilateral injection  -Frontalis muscle, bilateral injection, with 2 sites each side, medial injection was performed in the upper one third of the frontalis muscle, in the region vertical from the medial inferior edge  of the superior orbital rim. The lateral injection was again in the upper one third of the forehead vertically above the lateral limbus of the cornea, 1.5 cm lateral to the medial injection site.  -Temporalis muscle injection, 4 sites, bilaterally. The first injection was 3 cm above the tragus of the ear, second injection site was 1.5 cm to 3 cm up from the first injection site in line with the tragus of the ear. The third injection site was 1.5-3 cm forward between the first 2 injection sites. The fourth injection site was 1.5 cm posterior to the second injection site.  -Occipitalis muscle injection, 3 sites, bilaterally. The first injection was done one half way between the occipital protuberance and the tip of the mastoid process behind the ear. The second injection site was done lateral and superior to the first, 1 fingerbreadth from the first injection. The third injection site was 1 fingerbreadth superiorly and medially from the first injection site.  -Cervical paraspinal muscle injection, 2 sites, bilateral knee first injection site was 1 cm from the midline of the cervical spine, 3 cm inferior to the lower border of the occipital protuberance. The second injection site was 1.5 cm superiorly and laterally to the first injection site.  -Trapezius muscle injection was performed at 3 sites, bilaterally. The first injection site was in the upper trapezius muscle halfway between the inflection point of the neck, and the acromion. The second injection site was one half way between the acromion and the first injection site. The third injection was done between the first injection site and the inflection point of the neck.   Will return for repeat injection in 3 months.   A 200 unit sof Botox was used, 155 units were injected, the rest of the Botox was wasted. The patient tolerated the procedure well, there were no complications of the above procedure.

## 2016-09-05 MED ORDER — TOPIRAMATE ER 100 MG PO SPRINKLE CAP24: 100.0000 mg | EXTENDED_RELEASE_CAPSULE | Freq: Every day | ORAL | 6 refills | Status: DC

## 2016-09-05 NOTE — Patient Instructions (Signed)
Topiramate: Denies she ever took it, can retry  Week one 25mg  at bedtime Week two 50mg  at bedtime Week three 75mg  at bedtime Week four 100mg  at bedtime  Topiramate extended-release capsules What is this medicine? TOPIRAMATE (toe PYRE a mate) is used to treat seizures in adults or children with epilepsy. It is also used for the prevention of migraine headaches. This medicine may be used for other purposes; ask your health care provider or pharmacist if you have questions. COMMON BRAND NAME(S): Trokendi XR What should I tell my health care provider before I take this medicine? They need to know if you have any of these conditions: -cirrhosis of the liver or liver disease -diarrhea -glaucoma -kidney stones or kidney disease -lung disease like asthma, obstructive pulmonary disease, emphysema -metabolic acidosis -on a ketogenic diet -scheduled for surgery or a procedure -suicidal thoughts, plans, or attempt; a previous suicide attempt by you or a family member -an unusual or allergic reaction to topiramate, other medicines, foods, dyes, or preservatives -pregnant or trying to get pregnant -breast-feeding How should I use this medicine? Take this medicine by mouth with a glass of water. Follow the directions on the prescription label. Trokendi XR capsules must be swallowed whole. Do not sprinkle on food, break, crush, dissolve, or chew. Qudexy XR capsules may be swallowed whole or opened and sprinkled on a small amount of soft food. This mixture must be swallowed immediately. Do not chew or store mixture for later use. You may take this medicine with meals. Take your medicine at regular intervals. Do not take it more often than directed. Talk to your pediatrician regarding the use of this medicine in children. Special care may be needed. While Trokendi XR may be prescribed for children as young as 6 years and Qudexy XR may be prescribed for children as young as 2 years for selected conditions,  precautions do apply. Overdosage: If you think you have taken too much of this medicine contact a poison control center or emergency room at once. NOTE: This medicine is only for you. Do not share this medicine with others. What if I miss a dose? If you miss a dose, take it as soon as you can. If it is almost time for your next dose, take only that dose. Do not take double or extra doses. What may interact with this medicine? Do not take this medicine with any of the following medications: -probenecid This medicine may also interact with the following medications: -acetazolamide -alcohol -amitriptyline -birth control pills -digoxin -hydrochlorothiazide -lithium -medicines for pain, sleep, or muscle relaxation -metformin -methazolamide -other seizure or epilepsy medicines -pioglitazone -risperidone This list may not describe all possible interactions. Give your health care provider a list of all the medicines, herbs, non-prescription drugs, or dietary supplements you use. Also tell them if you smoke, drink alcohol, or use illegal drugs. Some items may interact with your medicine. What should I watch for while using this medicine? Visit your doctor or health care professional for regular checks on your progress. Do not stop taking this medicine suddenly. This increases the risk of seizures if you are using this medicine to control epilepsy. Wear a medical identification bracelet or chain to say you have epilepsy or seizures, and carry a card that lists all your medicines. This medicine can decrease sweating and increase your body temperature. Watch for signs of deceased sweating or fever, especially in children. Avoid extreme heat, hot baths, and saunas. Be careful about exercising, especially in hot weather. Contact  your health care provider right away if you notice a fever or decrease in sweating. You should drink plenty of fluids while taking this medicine. If you have had kidney stones in  the past, this will help to reduce your chances of forming kidney stones. If you have stomach pain, with nausea or vomiting and yellowing of your eyes or skin, call your doctor immediately. You may get drowsy, dizzy, or have blurred vision. Do not drive, use machinery, or do anything that needs mental alertness until you know how this medicine affects you. To reduce dizziness, do not sit or stand up quickly, especially if you are an older patient. Alcohol can increase drowsiness and dizziness. Avoid alcoholic drinks. Do not drink alcohol for 6 hours before or 6 hours after taking Trokendi XR. If you notice blurred vision, eye pain, or other eye problems, seek medical attention at once for an eye exam. The use of this medicine may increase the chance of suicidal thoughts or actions. Pay special attention to how you are responding while on this medicine. Any worsening of mood, or thoughts of suicide or dying should be reported to your health care professional right away. This medicine may increase the chance of developing metabolic acidosis. If left untreated, this can cause kidney stones, bone disease, or slowed growth in children. Symptoms include breathing fast, fatigue, loss of appetite, irregular heartbeat, or loss of consciousness. Call your doctor immediately if you experience any of these side effects. Also, tell your doctor about any surgery you plan on having while taking this medicine since this may increase your risk for metabolic acidosis. Birth control pills may not work properly while you are taking this medicine. Talk to your doctor about using an extra method of birth control. Women who become pregnant while using this medicine may enroll in the [place] Pregnancy Registry by calling [phone]. This registry collects information about the safety of antiepileptic drug use during pregnancy. What side effects may I notice from receiving this medicine? Side effects  that you should report to your doctor or health care professional as soon as possible: -allergic reactions like skin rash, itching or hives, swelling of the face, lips, or tongue -decreased sweating and/or rise in body temperature -depression -difficulty breathing, fast or irregular breathing patterns -difficulty speaking -difficulty walking or controlling muscle movements -hearing impairment -redness, blistering, peeling or loosening of the skin, including inside the mouth -tingling, pain or numbness in the hands or feet -unusually weak or tired -worsening of mood, thoughts or actions of suicide or dying Side effects that usually do not require medical attention (report to your doctor or health care professional if they continue or are bothersome): -altered taste -back pain, joint or muscle aches and pains -diarrhea, or constipation -headache -loss of appetite -nausea -stomach upset, indigestion -tremors This list may not describe all possible side effects. Call your doctor for medical advice about side effects. You may report side effects to FDA at 1-800-FDA-1088. Where should I keep my medicine? Keep out of the reach of children. Store at room temperature between 15 and 30 degrees C (59 and 86 degrees F) in a tightly closed container. Protect from moisture. Throw away any unused medicine after the expiration date. NOTE: This sheet is a summary. It may not cover all possible information. If you have questions about this medicine, talk to your doctor, pharmacist, or health care provider.  2018 Elsevier/Gold Standard (2015-08-09 12:33:11)

## 2016-09-18 ENCOUNTER — Telehealth: Payer: Self-pay | Admitting: Neurology

## 2016-09-18 ENCOUNTER — Other Ambulatory Visit: Payer: Self-pay | Admitting: Neurology

## 2016-09-18 MED ORDER — TOPIRAMATE ER 100 MG PO SPRINKLE CAP24
100.0000 mg | EXTENDED_RELEASE_CAPSULE | Freq: Every day | ORAL | 11 refills | Status: DC
Start: 2016-09-18 — End: 2016-10-01

## 2016-09-18 NOTE — Telephone Encounter (Signed)
Spoke to patient, the Qudexy 100mg  working great sent in prescription.  Delsa Sale, can we please place a specialty pharmacy order for [facility] for patient? Thank you!

## 2016-09-21 NOTE — Telephone Encounter (Signed)
[facility] enrollment form for [place] completed, awaiting MD signature. 

## 2016-09-22 NOTE — Telephone Encounter (Signed)
Signed and faxed to [facility] # [phone]. 

## 2016-09-30 ENCOUNTER — Telehealth: Payer: Self-pay

## 2016-09-30 NOTE — Telephone Encounter (Signed)
Received fax from pt's pharmacy stating that Qudexy is non-formulary drug and not covered by pt's insurance. Spoke to pt who says that she tolerated samples well and felt that med was helping. However, she stopped taking it when she ran out of samples due to medication expense. Called pharmacy to try Platinum Pass card but unfortunately pt has Medicare Part D coverage and not commercial insurance. Unable to use prescription co-pay card.

## 2016-10-01 ENCOUNTER — Other Ambulatory Visit: Payer: Self-pay | Admitting: Neurology

## 2016-10-01 MED ORDER — TOPIRAMATE 100 MG PO TABS: 100.0000 mg | ORAL_TABLET | Freq: Every day | ORAL | 11 refills | Status: DC

## 2016-10-01 NOTE — Telephone Encounter (Signed)
Called pt and she is willing to try generic topiramate. Says that she just got a text from her pharmacy saying that rx was ready. Voiced appreciation for call.

## 2016-10-01 NOTE — Telephone Encounter (Signed)
I ordered generic Topiramate to her pharmacy she can start that. Also, let he know the new medication Gerre Scull is out now if she wants to try it. Thanks!

## 2016-10-13 ENCOUNTER — Other Ambulatory Visit: Payer: Self-pay | Admitting: General Surgery

## 2016-10-13 DIAGNOSIS — K623 Rectal prolapse: Secondary | ICD-10-CM | POA: Diagnosis not present

## 2016-10-13 NOTE — H&P (Signed)
History of Present Illness Leighton Ruff MD; 2/42/3536 12:08 PM) The patient is a 66 year old female who presents with anal itching. this is a 66 year old female here for evaluetion of her rectal prolapse. I saw her approximately 2 year ago for similar symptoms but these were intermittent. She presents today with almost daily prolapse. She is having pain. Her last colonoscopy was in 2011 and several polyps were found. She denies any rectal bleeding.    Problem List/Past Medical Mammie Lorenzo, LPN; 1/44/3154 00:86 PM) RECTAL PROLAPSE (K62.3)  Past Surgical History Mammie Lorenzo, LPN; 7/61/9509 32:67 PM) Appendectomy Colon Polyp Removal - Colonoscopy Oral Surgery Spinal Surgery - Lower Back Spinal Surgery - Neck  Diagnostic Studies History Mammie Lorenzo, LPN; 05/28/5807 98:33 PM) Colonoscopy 1-5 years ago Mammogram 1-3 years ago Pap Smear 1-5 years ago  Allergies Mammie Lorenzo, LPN; 12/26/537 76:73 AM) No Known Drug Allergies 03/08/2014  Medication History Mammie Lorenzo, LPN; 08/20/3788 24:09 AM) Cymbalta (60MG  Capsule DR Part, Oral) Active. Desyrel (50MG  Tablet, Oral) Active. BusPIRone HCl (15MG  Tablet, Oral) Active. Lisinopril (10MG  Tablet, Oral) Active. Lisinopril-Hydrochlorothiazide (20-12.5MG  Tablet, Oral) Active. SUMAtriptan Succinate (100MG  Tablet, Oral) Active. Medications Reconciled  Social History Mammie Lorenzo, LPN; 7/35/3299 24:26 PM) Alcohol use Occasional alcohol use. Caffeine use Carbonated beverages. No drug use Tobacco use Former smoker.  Family History Mammie Lorenzo, LPN; 8/34/1962 22:97 PM) Arthritis Mother. Depression Mother. Migraine Headache Sister.  Pregnancy / Birth History Mammie Lorenzo, LPN; 9/89/2119 41:74 PM) Age at menarche 2 years. Age of menopause 51-54 Contraceptive History Oral contraceptives. Gravida 3 Irregular periods Length (months) of breastfeeding 7-12 Maternal age 92-35 Para  3  Other Problems Mammie Lorenzo, LPN; 0/81/4481 85:63 PM) Anxiety Disorder Gastric Ulcer High blood pressure     Review of Systems Mammie Lorenzo LPN; 1/49/7026 37:85 PM) General Present- Appetite Loss and Weight Gain. Not Present- Chills, Fatigue, Fever, Night Sweats and Weight Loss. Skin Not Present- Change in Wart/Mole, Dryness, Hives, Jaundice, New Lesions, Non-Healing Wounds, Rash and Ulcer. HEENT Not Present- Earache, Hearing Loss, Hoarseness, Nose Bleed, Oral Ulcers, Ringing in the Ears, Seasonal Allergies, Sinus Pain, Sore Throat, Visual Disturbances, Wears glasses/contact lenses and Yellow Eyes. Respiratory Not Present- Bloody sputum, Chronic Cough, Difficulty Breathing, Snoring and Wheezing. Breast Not Present- Breast Mass, Breast Pain, Nipple Discharge and Skin Changes. Cardiovascular Not Present- Chest Pain, Difficulty Breathing Lying Down, Leg Cramps, Palpitations, Rapid Heart Rate, Shortness of Breath and Swelling of Extremities. Gastrointestinal Not Present- Abdominal Pain, Bloating, Bloody Stool, Change in Bowel Habits, Chronic diarrhea, Constipation, Difficulty Swallowing, Excessive gas, Gets full quickly at meals, Hemorrhoids, Indigestion, Nausea, Rectal Pain and Vomiting. Female Genitourinary Not Present- Frequency, Nocturia, Painful Urination, Pelvic Pain and Urgency. Musculoskeletal Present- Back Pain and Joint Stiffness. Not Present- Joint Pain, Muscle Pain, Muscle Weakness and Swelling of Extremities. Neurological Present- Headaches and Tingling. Not Present- Decreased Memory, Fainting, Numbness, Seizures, Tremor, Trouble walking and Weakness. Psychiatric Present- Anxiety and Change in Sleep Pattern. Not Present- Bipolar, Depression, Fearful and Frequent crying. Hematology Present- Easy Bruising. Not Present- Blood Thinners, Excessive bleeding, Gland problems, HIV and Persistent Infections.  Vitals Claiborne Billings Dockery LPN; 8/85/0277 41:28 AM) 10/13/2016 11:48 AM Weight:  125.4 lb Height: 62in Body Surface Area: 1.57 m Body Mass Index: 22.94 kg/m  Temp.: 97.14F(Oral)  Pulse: 92 (Regular)  BP: 118/66 (Sitting, Left Arm, Standard)      Physical Exam Leighton Ruff MD; 7/86/7672 12:10 PM)  General Mental Status-Alert. General Appearance-Cooperative.  Chest and Lung Exam Auscultation Breath sounds - Normal.  Cardiovascular Cardiovascular examination reveals -normal heart sounds, regular rate and rhythm with no murmurs.  Abdomen Palpation/Percussion Palpation and Percussion of the abdomen reveal - Soft and Non Tender.  Rectal Note: no masses. Good rectal tone, weak squeeze    Assessment & Plan Leighton Ruff MD; 1/56/1537 12:03 PM)  RECTAL PROLAPSE (K62.3) Impression: Keoni is a 66 year old female who continues to have rectal prolapse. This has evolved over the last 6 months to occurring with every bowel movement. I have reviewed a photo of her prolapse and she appears to have 3-4 cm of full-thickness prolapse. I have recommended a robotic rectopexy. She does not have any history of constipation and therefore we will not perform a sigmoid resection. She understands that she may have some difficulty with constipation after surgery but this is not guaranteed. The surgery and anatomy were described to the patient as well as the risks of surgery and the possible complications. These include: Bleeding, deep abdominal infections and possible wound complications such as hernia and infection, damage to adjacent structures, leak of surgical connections, which can lead to other surgeries and possibly an ostomy, possible need for other procedures, such as abscess drains in radiology, possible prolonged hospital stay, possible diarrhea from removal of part of the colon, possible constipation from narcotics, possible bowel, bladder or sexual dysfunction if having rectal surgery, prolonged fatigue/weakness or appetite loss, possible early  recurrence of of disease, possible complications of their medical problems such as heart disease or arrhythmias or lung problems, death (less than 1%). I believe the patient understands and wishes to proceed with the surgery.

## 2016-10-15 DIAGNOSIS — M5481 Occipital neuralgia: Secondary | ICD-10-CM | POA: Diagnosis not present

## 2016-10-15 DIAGNOSIS — R51 Headache: Secondary | ICD-10-CM | POA: Diagnosis not present

## 2016-10-15 DIAGNOSIS — M47816 Spondylosis without myelopathy or radiculopathy, lumbar region: Secondary | ICD-10-CM | POA: Diagnosis not present

## 2016-10-15 DIAGNOSIS — M4712 Other spondylosis with myelopathy, cervical region: Secondary | ICD-10-CM | POA: Diagnosis not present

## 2016-10-19 NOTE — Telephone Encounter (Signed)
OV and Botox notes faxed to [place] at [facility] to process PA as requested.

## 2016-10-19 NOTE — Telephone Encounter (Signed)
Susi/Avella [phone] said cambia needs PA. She needs chart notes.

## 2016-10-21 ENCOUNTER — Other Ambulatory Visit: Payer: Self-pay | Admitting: Internal Medicine

## 2016-10-21 DIAGNOSIS — Z1231 Encounter for screening mammogram for malignant neoplasm of breast: Secondary | ICD-10-CM

## 2016-10-22 NOTE — Telephone Encounter (Signed)
[place] PA approved by Blase Mess [phone]) through 05/03/17.

## 2016-11-03 ENCOUNTER — Other Ambulatory Visit: Payer: Self-pay | Admitting: General Surgery

## 2016-11-06 ENCOUNTER — Ambulatory Visit
Admission: RE | Admit: 2016-11-06 | Discharge: 2016-11-06 | Disposition: A | Discharge status: Home / Self Care | Payer: PPO | Source: Ambulatory Visit | Attending: Internal Medicine | Admitting: Internal Medicine

## 2016-11-06 DIAGNOSIS — Z1231 Encounter for screening mammogram for malignant neoplasm of breast: Secondary | ICD-10-CM | POA: Diagnosis not present

## 2016-11-17 ENCOUNTER — Telehealth: Payer: Self-pay | Admitting: Neurology

## 2016-11-17 ENCOUNTER — Ambulatory Visit (INDEPENDENT_AMBULATORY_CARE_PROVIDER_SITE_OTHER): Payer: PPO | Admitting: Neurology

## 2016-11-17 VITALS — BP 129/91 | HR 100 | Ht 61.0 in | Wt 126.8 lb

## 2016-11-17 DIAGNOSIS — G43709 Chronic migraine without aura, not intractable, without status migrainosus: Secondary | ICD-10-CM

## 2016-11-17 NOTE — Progress Notes (Signed)

## 2016-11-17 NOTE — Progress Notes (Signed)
Botox 100 units/vial x 2 from office supply (B/B) [place] 9735-3299-24 Lot [id number] Exp 10 2020  Diluted in 4 ml of Bacteriostatic 0.9% NaCl NDC 6222-9798-92 Lot [id number]

## 2016-11-17 NOTE — Telephone Encounter (Signed)
Please call pt to schedule BOTOX appt. Thank you. °

## 2016-11-18 NOTE — Telephone Encounter (Signed)
Patient already has an apt.

## 2016-11-23 ENCOUNTER — Telehealth: Payer: Self-pay

## 2016-11-23 NOTE — Telephone Encounter (Signed)
[employer] service request form and rx completed, signed and faxed to [employer].

## 2016-11-25 ENCOUNTER — Telehealth: Payer: Self-pay

## 2016-11-25 DIAGNOSIS — G4700 Insomnia, unspecified: Secondary | ICD-10-CM

## 2016-11-25 MED ORDER — TRAZODONE HCL 50 MG PO TABS: 50.0000 mg | ORAL_TABLET | Freq: Every evening | ORAL | 1 refills | Status: DC | PRN

## 2016-11-25 NOTE — Telephone Encounter (Signed)
Pt left v/m; pt had been taking trazodone 50 mg to help sleeping; 3 weeks ago pt went up to 1 1/2 tab of trazodone due to work problems; pt said the doctor that initially prescribed told pt could take up to 3 tabs of the trazodone 50 mg. Pt request refill of trazodone 50 mg with instructions 1 1/2 tab to [facility] [place].pt wants to verify with Allie Bossier NP that it is OK to increase to 1 1/2 tabs at hs. Pt request cb. Pt seen 06/16/16.

## 2016-11-25 NOTE — Telephone Encounter (Signed)
She may take 1-2 tablets as needed for sleep. New Rx sent to pharmacy.

## 2016-11-25 NOTE — Telephone Encounter (Signed)
Spoken and notified patient of Kate's comments. Patient verbalized understanding. 

## 2016-11-29 ENCOUNTER — Encounter: Payer: Self-pay | Admitting: Primary Care

## 2016-11-30 NOTE — Telephone Encounter (Signed)
Please schedule patient for CPE plus an extra 15 minutes to discuss her issues. Thanks. See My Chart message.

## 2016-11-30 NOTE — Telephone Encounter (Signed)
Spoken to patient and schedule on 12/02/2016

## 2016-12-02 ENCOUNTER — Ambulatory Visit (INDEPENDENT_AMBULATORY_CARE_PROVIDER_SITE_OTHER): Payer: PPO | Admitting: Primary Care

## 2016-12-02 ENCOUNTER — Encounter: Payer: Self-pay | Admitting: Primary Care

## 2016-12-02 VITALS — BP 114/70 | HR 100 | Temp 97.7°F | Ht 61.0 in | Wt 129.8 lb

## 2016-12-02 DIAGNOSIS — K6289 Other specified diseases of anus and rectum: Secondary | ICD-10-CM

## 2016-12-02 DIAGNOSIS — E559 Vitamin D deficiency, unspecified: Secondary | ICD-10-CM | POA: Diagnosis not present

## 2016-12-02 DIAGNOSIS — R1084 Generalized abdominal pain: Secondary | ICD-10-CM

## 2016-12-02 DIAGNOSIS — I10 Essential (primary) hypertension: Secondary | ICD-10-CM | POA: Diagnosis not present

## 2016-12-02 DIAGNOSIS — G43709 Chronic migraine without aura, not intractable, without status migrainosus: Secondary | ICD-10-CM

## 2016-12-02 DIAGNOSIS — Z0000 Encounter for general adult medical examination without abnormal findings: Secondary | ICD-10-CM

## 2016-12-02 DIAGNOSIS — E785 Hyperlipidemia, unspecified: Secondary | ICD-10-CM

## 2016-12-02 DIAGNOSIS — F419 Anxiety disorder, unspecified: Secondary | ICD-10-CM

## 2016-12-02 DIAGNOSIS — H25043 Posterior subcapsular polar age-related cataract, bilateral: Secondary | ICD-10-CM | POA: Diagnosis not present

## 2016-12-02 DIAGNOSIS — H25013 Cortical age-related cataract, bilateral: Secondary | ICD-10-CM | POA: Diagnosis not present

## 2016-12-02 DIAGNOSIS — D509 Iron deficiency anemia, unspecified: Secondary | ICD-10-CM

## 2016-12-02 DIAGNOSIS — K289 Gastrojejunal ulcer, unspecified as acute or chronic, without hemorrhage or perforation: Secondary | ICD-10-CM

## 2016-12-02 DIAGNOSIS — M8580 Other specified disorders of bone density and structure, unspecified site: Secondary | ICD-10-CM | POA: Diagnosis not present

## 2016-12-02 DIAGNOSIS — Z0001 Encounter for general adult medical examination with abnormal findings: Secondary | ICD-10-CM | POA: Diagnosis not present

## 2016-12-02 DIAGNOSIS — H2513 Age-related nuclear cataract, bilateral: Secondary | ICD-10-CM | POA: Diagnosis not present

## 2016-12-02 DIAGNOSIS — F329 Major depressive disorder, single episode, unspecified: Secondary | ICD-10-CM | POA: Diagnosis not present

## 2016-12-02 DIAGNOSIS — H16223 Keratoconjunctivitis sicca, not specified as Sjogren's, bilateral: Secondary | ICD-10-CM | POA: Diagnosis not present

## 2016-12-02 HISTORY — DX: Generalized abdominal pain: R10.84

## 2016-12-02 MED ORDER — DICYCLOMINE HCL 10 MG PO CAPS: ORAL_CAPSULE | ORAL | 0 refills | Status: DC

## 2016-12-02 NOTE — Assessment & Plan Note (Signed)
Following with Neurology, feels well managed.

## 2016-12-02 NOTE — Assessment & Plan Note (Signed)
Lipid panel pending.

## 2016-12-02 NOTE — Patient Instructions (Addendum)
Complete lab work prior to leaving today. I will notify you of your results once received.   [facility] to [facility] your bone density test.  Complete the Shingrix vaccination as scheduled.  Complete the colonoscopy as scheduled.  You may try taking the dicyclomine (Bentyl) medication three times daily before meals and at bedtime as needed for abdominal symptoms.  It was a pleasure to see you today!

## 2016-12-02 NOTE — Assessment & Plan Note (Signed)
Symptoms representative of IBS. Given history of ulcers and recent rectal bleeding will check labs including CBC, CMP, H pylori. Rx for dicyclomine sent to pharmacy. Consider switching from Pepcid to omeprazole. Will await labs. Colonoscopy scheduled for August 2018.

## 2016-12-02 NOTE — Assessment & Plan Note (Signed)
Abdominal symptoms with rectal bleeding, this could be secondary to an ulcer. CBC, CMP, H pylori pending. Colonoscopy due in late August.

## 2016-12-02 NOTE — Assessment & Plan Note (Signed)
Check CBC today.  

## 2016-12-02 NOTE — Assessment & Plan Note (Signed)
Due for repeat bone density scan, order placed.

## 2016-12-02 NOTE — Progress Notes (Deleted)
   Subjective:    Patient ID: [name], female    DOB: 1950-11-20, 66 y.o.   MRN: [id number]  HPI  [name] is a 66 year old female who presents today for complete physical.  Immunizations: -Tetanus: Completed in 2014 -Influenza: Did not complete last season.  -Pneumonia: Completed Prevnar in August 2017. -Shingles: Completed Zostavax in 2014, completed Shingrix in July 2018.  Diet: She endorses a healthy diet. Breakfast: Cereal, egg whites, cheese, avocado, english muffin  Lunch: Sub sandwich, Dinner: Chicken, rice, veggies, hot dogs, scallops Snacks: None Desserts: Frozen Yogurt bar Beverages: Water, diet Coke, wine  Exercise: She is not exercising.  Eye exam: Completed in August 2018. Cataracts, dry eyes, possible detatched retina.  Dental exam: Completes semi-annually Colonoscopy: Completed in 2012. Due in August 2018.  Dexa: Completed in 2016, osteopenia Pap Smear: Completed in 2015 Mammogram: Completed in July 2018. Hep C Screen:   Review of Systems  Constitutional: Positive for appetite change. Negative for unexpected weight change.  HENT: Negative for rhinorrhea.   Respiratory: Negative for cough and shortness of breath.   Cardiovascular: Negative for chest pain.  Gastrointestinal: Negative for constipation and diarrhea.  Genitourinary: Negative for difficulty urinating and menstrual problem.  Musculoskeletal: Negative for arthralgias and myalgias.  Skin: Negative for rash.  Allergic/Immunologic: Negative for environmental allergies.  Neurological: Negative for dizziness, numbness and headaches.       Objective:   Physical Exam        Assessment & Plan:

## 2016-12-02 NOTE — Assessment & Plan Note (Signed)
Immunizations UTD. Pneumovax due, will administer during next visit. Mammogram and Pap UTD. Bone density scan due, ordered and pending. Colonoscopy due and is scheduled for late August 2018. Exam unremarkable. Labs pending. ROS with abdominal complaints which were addressed. Recommended regular exercise. Increase vegetables, fruit, whole grains. All recommendations provided at end of visit.  I have personally reviewed and have noted: 1. The patient's medical and social history 2. Their use of alcohol, tobacco or illicit drugs 3. Their current medications and supplements 4. The patient's functional ability including ADL's, fall  risks, home safety risks and hearing or visual  impairment. 5. Diet and physical activities 6. Evidence for depression or mood disorder

## 2016-12-02 NOTE — Assessment & Plan Note (Signed)
Stable in the office today, continue lisinopril 10 mg. 

## 2016-12-02 NOTE — Progress Notes (Signed)
Patient ID: [name], female   DOB: 1950-12-22, 66 y.o.   MRN: [id number]   HPI: Ms. [name] is a 66 year old female who presents today for Bentley.  Past Medical History:  Diagnosis Date  . 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 Asthma   . COLONIC POLYPS, HX OF 11/15/2009  . DEPRESSION 11/14/2009  . GASTROJEJ ULCR UNS ACUT/CHRN W/O HEMOR PERF/OBST 11/06/2009  . Headache(784.0) 11/15/2009  . Heart murmur   . HEART MURMUR, HX OF 11/15/2009  . HIATAL HERNIA 10/08/2009  . HIP FRACTURE, LEFT 11/14/2009  . HYPERLIPIDEMIA 11/14/2009  . HYPERTENSION 11/14/2009  . OSTEOPOROSIS 11/14/2009  . WEIGHT GAIN 01/10/2010    Current Outpatient Prescriptions  Medication Sig Dispense Refill  . CALCIUM PO Take by mouth daily.    . DULoxetine (CYMBALTA) 60 MG capsule Take 60 mg by mouth daily.     Marland Kitchen lisinopril (PRINIVIL,ZESTRIL) 10 MG tablet Take 1 tablet (10 mg total) by mouth daily. 90 tablet 3  . Multiple Vitamins-Minerals (MULTIVITAMIN PO) Take by mouth.    . oxyCODONE-acetaminophen (PERCOCET) 5-325 MG per tablet Take 1 tablet by mouth every 6 (six) hours as needed for moderate pain.     . traZODone (DESYREL) 50 MG tablet Take 1-2 tablets (50-100 mg total) by mouth at bedtime as needed for sleep. (Patient taking differently: Take 75 mg by mouth at bedtime as needed for sleep. ) 180 tablet 1  . dicyclomine (BENTYL) 10 MG capsule Take 1 capsule by mouth three times daily before meals and at bedtime as needed for abdominal symptoms. 60 capsule 0  . SUMAtriptan (IMITREX) 100 MG tablet Take 1 tablet (100 mg total) by mouth once. May repeat in 2 hours.Max 2 tabs in one day. Max 10tabs  in one month. 10 tablet 12   No current facility-administered medications for this visit.     No Known Allergies  Family History  Problem Relation Age of Onset  . Bone cancer Mother   . Hypertension Other   . Allergic rhinitis Neg Hx   . Asthma Neg Hx   . Urticaria Neg Hx     Social History   Social History  . Marital  status: Married    Spouse name: [name]  . Number of children: 3  . Years of education: BA   Occupational History  . [employer]    Social History Main Topics  . Smoking status: Former Smoker    Quit date: 05/04/1982  . 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 Alcohol use Yes  . Drug use: No  . Sexual activity: Not on file   Other Topics Concern  . Not on file   Social History Narrative   Lives with husband   Caffeine use: 1 cup or less     Hospitiliaztions: None  Health Maintenance:    Flu: Completes annually.  Tetanus: Completed in 2017  Pneumovax: Due.  Prevnar: Completed in 2017  Zostavax: Completed Zostavax in 2014, Shingrix first dose in July  2018.  Bone Density: Completed in 2016, osteopenia  Colonoscopy: Completed in 2012, due this month.  Eye Doctor: Completed in August 2018  Dental Exam: Completes annually  Mammogram: Completed in July 2018  Pap: Completed in 2015, normal.  Hep C Screening: Completed in 2017, negative.    Providers: Alma Friendly, PCP; GI; Neurology   I  have personally reviewed and have noted: 1. The patient's medical and social history 2. Their use of alcohol, tobacco or illicit drugs 3. Their current medications and supplements 4. The patient's functional ability including ADL's, fall risks, home safety  risks and hearing or visual impairment. 5. Diet and physical activities 6. Evidence for depression or mood disorder  Subjective:   Review of Systems:   Constitutional: Denies fever, malaise, fatigue.  HEENT: Denies ear pain, ringing in the ears, wax buildup, runny nose, nasal congestion, bloody nose, or sore throat. Working with optometry for possible detached regina and cataracts. Respiratory: Denies difficulty breathing, shortness of breath, cough or sputum production.    Cardiovascular: Denies chest pain, chest tightness, palpitations or swelling in the hands or feet.  Gastrointestinal: Symptoms began in January. Symptoms of bloating, abdominal pain, decrease in appetite to various places, diarrhea occasionally with symptoms. One episode of vomiting 4 days ago. Intermittent acid reflux on pepcid. Has had bloody stools occasionally. Due for colonoscopy in August. Also with rectal prolapse.  GU: Denies urgency, frequency, pain with urination, burning sensation, blood in urine, odor or discharge. Musculoskeletal: Denies decrease in range of motion, difficulty with gait, muscle pain or joint pain and swelling.  Skin: Denies redness, rashes, lesions or ulcercations.  Neurological: Denies dizziness, difficulty with speech or problems with balance and coordination. Chronic headaches, managed per Neurology. Psychiatric:  Increased stress/anxiety with work, death in the family, husband in a car accident.   No other specific complaints in a complete review of systems (except as listed in HPI above).  Objective:  PE:   BP 114/70   Pulse 100   Temp 97.7 F (36.5 C) (Oral)   Ht 5' 1"  (1.549 m)   Wt 129 lb 12.8 oz (58.9 kg)   SpO2 99%   BMI 24.53 kg/m  Wt Readings from Last 3 Encounters:  12/02/16 129 lb 12.8 oz (58.9 kg)  11/17/16 126 lb 12.8 oz (57.5 kg)  09/03/16 125 lb 3.2 oz (56.8 kg)    General: Appears their stated age, well developed, well nourished in NAD. Skin: Warm, dry and intact. No rashes, lesions or ulcerations noted. HEENT: Head: normal shape and size; Eyes: sclera white, no icterus, conjunctiva pink, PERRLA and EOMs intact; Ears: Tm's gray and intact, normal light reflex; Nose: mucosa pink and moist, septum midline; Throat/Mouth: Teeth present, mucosa pink and moist, no exudate, lesions or ulcerations noted.  Neck: Normal range of motion. Neck supple, trachea midline. No massses, lumps or thyromegaly present.  Cardiovascular: Normal rate and  rhythm. S1,S2 noted.  No murmur, rubs or gallops noted. No JVD or BLE edema. No carotid bruits noted. Pulmonary/Chest: Normal effort and positive vesicular breath sounds. No respiratory distress. No wheezes, rales or ronchi noted.  Abdomen: Soft and nontender. Normal bowel sounds, no bruits noted. No distention or masses noted. Liver, spleen and kidneys non palpable. Musculoskeletal: Normal range of motion. No signs of joint swelling. No difficulty with gait.  Neurological: Alert and oriented. Cranial nerves II-XII intact. Coordination normal. +DTRs bilaterally. Psychiatric: Mood and affect normal. Behavior is normal. Judgment and thought content normal.     BMET    Component Value Date/Time   NA 140 06/16/2016 1040   K 3.9 06/16/2016 1040   CL 106 06/16/2016 1040   CO2 29 06/16/2016 1040   GLUCOSE 95 06/16/2016 1040   BUN 13 06/16/2016 1040   CREATININE 0.64 06/16/2016 1040   CALCIUM 9.3 06/16/2016 1040   GFRNONAA >60 10/04/2009 1200  GFRAA  10/04/2009 1200    >60        The eGFR has been calculated using the MDRD equation. This calculation has not been validated in all clinical situations. eGFR's persistently <60 mL/min signify possible Chronic Kidney Disease.    Lipid Panel     Component Value Date/Time   CHOL 165 07/15/2009   TRIG 58 07/15/2009   HDL 72 07/15/2009   LDLCALC 82 07/15/2009    CBC    Component Value Date/Time   WBC 6.9 01/10/2010 1058   RBC 3.88 01/10/2010 1058   HGB 12.5 01/10/2010 1058   HCT 36.3 01/10/2010 1058   PLT 189.0 01/10/2010 1058   MCV 93.7 01/10/2010 1058   MCHC 34.5 01/10/2010 1058   RDW 15.5 (H) 01/10/2010 1058   LYMPHSABS 1.8 01/10/2010 1058   MONOABS 0.6 01/10/2010 1058   EOSABS 0.3 01/10/2010 1058   BASOSABS 0.0 01/10/2010 1058    Hgb A1C Lab Results  Component Value Date   HGBA1C 5.9 01/10/2010      Assessment and Plan:   Medicare Annual Wellness Visit:  Diet: She endorses a healthy diet. Breakfast: Cereal,  egg whites, cheese, avocado, english muffin  Lunch: Sub sandwich, Dinner: Chicken, rice, veggies, hot dogs, scallops Snacks: None Desserts: Frozen Yogurt bar Beverages: Water, diet Coke, wine  Physical activity: Active, does not exercise. Depression/mood screen: Negative Hearing: Intact to whispered voice Visual acuity: Grossly normal, performs annual eye exam, following with opthalmology  ADLs: Capable Fall risk: None Home safety: Good Cognitive evaluation: Intact to orientation, naming, recall and repetition EOL planning: Adv directives, full code/ I agree  Preventative Medicine: Immunizations UTD. Pneumovax due, will administer during next visit. Mammogram and Pap UTD. Bone density scan due, ordered and pending. Colonoscopy due and is scheduled for late August 2018. Exam unremarkable. Labs pending. ROS with abdominal complaints which were addressed. Recommended regular exercise. Increase vegetables, fruit, whole grains. All recommendations provided at end of visit.  Next appointment: One year pending lab results.

## 2016-12-02 NOTE — Assessment & Plan Note (Addendum)
Increased stress and anxiety at work, also with some stress at home. Overall doing well off of Clonazepam, do think that her increased stress is occupational related. Continue Cymbalta and Trazodone.

## 2016-12-02 NOTE — Assessment & Plan Note (Signed)
Due for rectal prolapse repair in late August. Also due for colonoscopy. Given symptoms of rectal bleeding with abdominal pain, will check labs including CBC, CMP, H pylori. Also consider short term omeprazole if ulcer is considered. Rx for dicyclomine sent to pharmacy as symptoms are representative of IBS. Will await results.

## 2016-12-03 ENCOUNTER — Encounter: Payer: Self-pay | Admitting: Primary Care

## 2016-12-03 DIAGNOSIS — D2261 Melanocytic nevi of right upper limb, including shoulder: Secondary | ICD-10-CM | POA: Diagnosis not present

## 2016-12-03 DIAGNOSIS — H43821 Vitreomacular adhesion, right eye: Secondary | ICD-10-CM | POA: Diagnosis not present

## 2016-12-03 DIAGNOSIS — L821 Other seborrheic keratosis: Secondary | ICD-10-CM | POA: Diagnosis not present

## 2016-12-03 DIAGNOSIS — Z85828 Personal history of other malignant neoplasm of skin: Secondary | ICD-10-CM | POA: Diagnosis not present

## 2016-12-03 DIAGNOSIS — H35411 Lattice degeneration of retina, right eye: Secondary | ICD-10-CM | POA: Diagnosis not present

## 2016-12-03 DIAGNOSIS — D1801 Hemangioma of skin and subcutaneous tissue: Secondary | ICD-10-CM | POA: Diagnosis not present

## 2016-12-03 DIAGNOSIS — D2262 Melanocytic nevi of left upper limb, including shoulder: Secondary | ICD-10-CM | POA: Diagnosis not present

## 2016-12-03 DIAGNOSIS — D2271 Melanocytic nevi of right lower limb, including hip: Secondary | ICD-10-CM | POA: Diagnosis not present

## 2016-12-03 DIAGNOSIS — H43822 Vitreomacular adhesion, left eye: Secondary | ICD-10-CM | POA: Diagnosis not present

## 2016-12-03 DIAGNOSIS — L814 Other melanin hyperpigmentation: Secondary | ICD-10-CM | POA: Diagnosis not present

## 2016-12-03 DIAGNOSIS — H35412 Lattice degeneration of retina, left eye: Secondary | ICD-10-CM | POA: Diagnosis not present

## 2016-12-03 DIAGNOSIS — D225 Melanocytic nevi of trunk: Secondary | ICD-10-CM | POA: Diagnosis not present

## 2016-12-03 DIAGNOSIS — L570 Actinic keratosis: Secondary | ICD-10-CM | POA: Diagnosis not present

## 2016-12-03 LAB — COMPREHENSIVE METABOLIC PANEL WITH GFR
ALT: 13 U/L (ref 0–35)
AST: 18 U/L (ref 0–37)
Albumin: 4.3 g/dL (ref 3.5–5.2)
Alkaline Phosphatase: 54 U/L (ref 39–117)
BUN: 22 mg/dL (ref 6–23)
CO2: 29 meq/L (ref 19–32)
Calcium: 9.9 mg/dL (ref 8.4–10.5)
Chloride: 102 meq/L (ref 96–112)
Creatinine, Ser: 0.95 mg/dL (ref 0.40–1.20)
GFR: 62.33 mL/min
Glucose, Bld: 78 mg/dL (ref 70–99)
Potassium: 4 meq/L (ref 3.5–5.1)
Sodium: 137 meq/L (ref 135–145)
Total Bilirubin: 0.3 mg/dL (ref 0.2–1.2)
Total Protein: 6.8 g/dL (ref 6.0–8.3)

## 2016-12-03 LAB — CBC WITH DIFFERENTIAL/PLATELET
BASOS ABS: 0.1 10*3/uL (ref 0.0–0.1)
Basophils Relative: 1.3 % (ref 0.0–3.0)
EOS ABS: 0.3 10*3/uL (ref 0.0–0.7)
Eosinophils Relative: 3.8 % (ref 0.0–5.0)
HEMATOCRIT: 40.4 % (ref 36.0–46.0)
Hemoglobin: 13.3 g/dL (ref 12.0–15.0)
LYMPHS PCT: 34 % (ref 12.0–46.0)
Lymphs Abs: 2.9 10*3/uL (ref 0.7–4.0)
MCHC: 33 g/dL (ref 30.0–36.0)
MCV: 105.1 fl — ABNORMAL HIGH (ref 78.0–100.0)
MONO ABS: 0.8 10*3/uL (ref 0.1–1.0)
Monocytes Relative: 9.4 % (ref 3.0–12.0)
NEUTROS ABS: 4.3 10*3/uL (ref 1.4–7.7)
NEUTROS PCT: 51.5 % (ref 43.0–77.0)
PLATELETS: 253 10*3/uL (ref 150.0–400.0)
RBC: 3.84 Mil/uL — ABNORMAL LOW (ref 3.87–5.11)
RDW: 13.7 % (ref 11.5–15.5)
WBC: 8.4 10*3/uL (ref 4.0–10.5)

## 2016-12-03 LAB — LIPID PANEL
Cholesterol: 241 mg/dL — ABNORMAL HIGH (ref 0–200)
HDL: 82.2 mg/dL
LDL Cholesterol: 123 mg/dL — ABNORMAL HIGH (ref 0–99)
NonHDL: 158.95
Total CHOL/HDL Ratio: 3
Triglycerides: 178 mg/dL — ABNORMAL HIGH (ref 0.0–149.0)
VLDL: 35.6 mg/dL (ref 0.0–40.0)

## 2016-12-03 LAB — VITAMIN D 25 HYDROXY (VIT D DEFICIENCY, FRACTURES): VITD: 24.06 ng/mL — AB (ref 30.00–100.00)

## 2016-12-03 LAB — H. PYLORI ANTIBODY, IGG: H Pylori IgG: NEGATIVE

## 2016-12-03 LAB — HEMOGLOBIN A1C: HEMOGLOBIN A1C: 5.5 % (ref 4.6–6.5)

## 2016-12-03 LAB — TSH: TSH: 1.2 u[IU]/mL (ref 0.35–4.50)

## 2016-12-08 ENCOUNTER — Ambulatory Visit (INDEPENDENT_AMBULATORY_CARE_PROVIDER_SITE_OTHER): Payer: PPO | Admitting: Neurology

## 2016-12-08 ENCOUNTER — Encounter: Payer: Self-pay | Admitting: Neurology

## 2016-12-08 VITALS — BP 120/85 | HR 110 | Ht 61.0 in | Wt 128.6 lb

## 2016-12-08 DIAGNOSIS — G43709 Chronic migraine without aura, not intractable, without status migrainosus: Secondary | ICD-10-CM

## 2016-12-08 DIAGNOSIS — M791 Myalgia: Secondary | ICD-10-CM

## 2016-12-08 DIAGNOSIS — M26609 Unspecified temporomandibular joint disorder, unspecified side: Secondary | ICD-10-CM

## 2016-12-08 DIAGNOSIS — M7918 Myalgia, other site: Secondary | ICD-10-CM

## 2016-12-08 NOTE — Patient Instructions (Addendum)
Remember to drink plenty of fluid, eat healthy meals and do not skip any meals. Try to eat protein with a every meal and eat a healthy snack such as fruit or nuts in between meals. Try to keep a regular sleep-wake schedule and try to exercise daily, particularly in the form of walking, 20-30 minutes a day, if you can.   [email or web address] for an NTI brace  Physical therapy Magnesium Oxalate 500mg  a day to help with muscles  I would like to see you back for botox, sooner if we need to. Please call us with any interim questions, concerns, problems, updates or refill requests.   Our phone number is [phone]. We also have an after hours call service for urgent matters and there is a physician on-call for urgent questions. For any emergencies you know to call 911 or go to the nearest emergency room

## 2016-12-08 NOTE — Progress Notes (Signed)
[facility]    Provider:  Dr Jaynee Eagles Referring Provider: Pleas Koch, NP Primary Care Physician:  Pleas Koch, NP  CC:  Chronic migraines  Botox 3 times. Tried Depakote went up to 1500mg , Then Qudexy. Then generic Topiramate. Did not work/tolerate. No OTC medications. 90% on the left.  Was diagnosed with IBS and cataracts recently. She clenches her teeth at night, she uses her cpap at night. Doesn't drink a lot of caffeine daily. She has considerable neck pain and weakness, anterolisthesis of the neck, rounding of the shoulders, patient needs physical therapy for cervical myofascial pain, TMJ, forward posture, exercising for scapular stabilization, pectoral stretching and rhomboid strengthening.  HPI:  [name] is a 66 y.o. female here as a referral from Dr. Minna Antis for migraines. Past medical history of anxiety, depression, insomnia, osteoporosis, chronic pain, multiple allergies, NSAID-induced gastritis, hypertension, hyperlipidemia, migraine. She has had "stress headaches" all her life denies migraines. Sister and mother with migraine. Last year she started having new headaches shooting pain in her head all over and MRI was negative. Dr. Francesco Runner did injections to try and alleviate the headaches. The shooting pain started in the back of the head on the left but now gets shooting pain all over the head including the forehead. Last year in February she woke up with severe headache an went to an acupuncture.   For the last 7 months she has had migraines that are refractory to medications, she cannot stand light or noise, had a terrible one in February. Her stress headaches are pressure in the temples and across the forehead. The migraines  Have woken her up, with light sensitivity, sound sensitivity, nausea, she vomits, she has started waking up with headaches progressively worsening in the morning she wakes up once a month or every 3 weeks with a headache and  they can be debilitating. She has headaches every day tension type no light or sound sensitivity just pressure around the temples and forehead. The migraines that wake her up kept her at home all day. She continues to have shooting pain and tension tyope headaches and lots of stress in the neck. She does not take OTC medication, no medication overuse. Excedrin helped when she had the migraine. She has 8-10 migraine days a mont, migraines can  last up to 24 hours with daily 30/30 days a month of headachea. No medication overuse. No aura.   Failed: cymbalta(anti-depressant), topamax(anti-epileptic) this year, had side effects to topamax. Takes imitrex acutely. Tried zofran for nausea. Took lisinopril in the past which did not help her headaches (blood pressure medication), Depakote, Botox x 3  Reviewed notes, labs and imaging from outside physicians, which showed:   Hemoglobin A1c 5. 12/22/2015, BUN 18 and creatinine 0.69 in May 2017, TSH 1.8 in April 2016.  Personally reviewed MRI of the brain images and agree with the following 11/07/2014: No evidence for acute infarction, hemorrhage, mass lesion, hydrocephalus, or extra-axial fluid. Normal for age cerebral volume. No significant white matter disease. Flow voids are maintained throughout the carotid, basilar, and vertebral arteries. There are no areas of chronic hemorrhage.  Pituitary and cerebellar tonsils unremarkable. Previous cervical fusion extends cephalad is pars C3. Mild disc space narrowing C2-C3 and mild pannus without neural impingement.  Post infusion, no abnormal enhancement of the brain or meninges.  Incompletely evaluated on this routine brain study is a T2 hyperintense lesion within the LEFT paramedian pituitary. This measures approximately 5 mm in size. It appears fairly cystic,  and is unchanged from 2011. 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 If there is evidence for  elevated prolactin, MR of the pituitary should be performed.  Visualized calvarium, skull base, and upper cervical osseous structures unremarkable. Scalp and extracranial soft tissues, orbits, sinuses, and mastoids show no acute process.  IMPRESSION: Stable MR brain. No acute intracranial findings.  Previous C3 through C7 cervical fusion incompletely evaluated but could serve as a source of headaches. Correlate clinically.  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  Review of Systems: Patient complains of symptoms per HPI as well as the following symptoms: Easy bruising . Pertinent negatives per HPI. All others negative.   Social History   Social History  . Marital status: Married    Spouse name: [name]  . Number of children: 3  . Years of education: BA   Occupational History  . [employer]    Social History Main Topics  . Smoking status: Former Smoker    Quit date: 05/04/1982  . 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 Alcohol use Yes  . Drug use: No  . Sexual activity: Not on file   Other Topics Concern  . Not on file   Social History Narrative   Lives with husband   Caffeine use: 1 cup or less     Family History  Problem Relation Age of Onset  . Bone cancer Mother   . Hypertension Other   . Allergic rhinitis Neg Hx   . Asthma Neg Hx   . Urticaria Neg Hx     Past Medical History:  Diagnosis Date  . 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 Asthma   . COLONIC POLYPS, HX OF 11/15/2009  . DEPRESSION 11/14/2009  . GASTROJEJ ULCR UNS ACUT/CHRN W/O HEMOR PERF/OBST 11/06/2009  . Headache(784.0) 11/15/2009  . Heart murmur   . HEART MURMUR, HX OF 11/15/2009  . HIATAL HERNIA 10/08/2009  . HIP FRACTURE, LEFT 11/14/2009  . HYPERLIPIDEMIA 11/14/2009  . HYPERTENSION 11/14/2009  .  OSTEOPOROSIS 11/14/2009  . WEIGHT GAIN 01/10/2010    Past Surgical History:  Procedure Laterality Date  . APPENDECTOMY    . AUGMENTATION MAMMAPLASTY Bilateral   . BACK SURGERY    . Bleeding ulcers  10/2009  . Broken Hip  06/2001  . NECK SURGERY  04 & 06   x's 2    Current Outpatient Prescriptions  Medication Sig Dispense Refill  . CALCIUM PO Take by mouth daily.    Marland Kitchen dicyclomine (BENTYL) 10 MG capsule Take 1 capsule by mouth three times daily before meals and at bedtime as needed for abdominal symptoms. 60 capsule 0  . DULoxetine (CYMBALTA) 60 MG capsule Take 60 mg by mouth daily.     Marland Kitchen lisinopril-hydrochlorothiazide (PRINZIDE,ZESTORETIC) 20-12.5 MG tablet Take 1 tablet by mouth daily.  2  . Multiple Vitamins-Minerals (MULTIVITAMIN PO) Take by mouth.    . oxyCODONE-acetaminophen (PERCOCET) 5-325 MG per tablet Take 1 tablet by mouth every 6 (six) hours as needed for moderate pain.     . traZODone (DESYREL) 50 MG tablet Take 1-2 tablets (50-100 mg total) by mouth at bedtime as needed for sleep. (Patient taking differently: Take 75 mg by mouth at bedtime as needed for sleep. ) 180 tablet 1  . SUMAtriptan (IMITREX) 100 MG tablet Take  1 tablet (100 mg total) by mouth once. May repeat in 2 hours.Max 2 tabs in one day. Max 10tabs  in one month. 10 tablet 12   No current facility-administered medications for this visit.     Allergies as of 12/08/2016  . (No Known Allergies)    Vitals: BP 120/85   Pulse (!) 110   Ht 5\' 1"  (1.549 m)   Wt 128 lb 9.6 oz (58.3 kg)   BMI 24.30 kg/m  Last Weight:  Wt Readings from Last 1 Encounters:  12/08/16 128 lb 9.6 oz (58.3 kg)   Last Height:   Ht Readings from Last 1 Encounters:  12/08/16 5\' 1"  (1.549 m)   Anterocollis, left elevated shoulder and rounded shouldrs (pectoral hypertrophy) she has myofascial trigger point problems (4 fingers lateral to C7 = levator scapulae) indicating posture deficits and shoulder girdle weakness causing cervicalgia.     Assessment/Plan:  66 year old with chronic migraines without aura no status not intractable  Physical Therapy: patient needs physical therapy for cervical myofascial pain, TMJ, forward posture, exercising for scapular stabilization, pectoral stretching and rhomboid strengthening.  For her TMJ: Physical therapy, headache  for an NTI brace. Write PA and see if we can get botox every 10 weeks; patient has excellent control for the first 10 weeks and then has worsening severe daily migraines the last 2 weeks or her Botox, would like to request Botox every 10 weeks.  Also needs botox into the masticatory muscles and temporalis  Magnesium Oxalate 500mg  a day and tizanidine to help with muscles  Remember to drink plenty of fluid, eat healthy meals and do not skip any meals. Try to eat protein with a every meal and eat a healthy snack such as fruit or nuts in between meals. Try to keep a regular sleep-wake schedule and try to exercise daily, particularly in the form of walking, 20-30 minutes a day, if you can.   Orders Placed This Encounter  Procedures  . Ambulatory referral to Physical Therapy     Sarina Ill, MD  [facility] [address] [place], [place] [address]  Phone [phone] Fax [phone]  A total of 25 minutes was spent face-to-face with this patient. Over half this time was spent on counseling patient on the migraine, TMJ, cervical myofascial pain diagnosis and different diagnostic and therapeutic options available.

## 2016-12-18 ENCOUNTER — Encounter (HOSPITAL_COMMUNITY): Payer: Self-pay | Admitting: *Deleted

## 2016-12-19 ENCOUNTER — Other Ambulatory Visit: Payer: Self-pay | Admitting: Primary Care

## 2016-12-19 DIAGNOSIS — R1084 Generalized abdominal pain: Secondary | ICD-10-CM

## 2016-12-21 DIAGNOSIS — H2513 Age-related nuclear cataract, bilateral: Secondary | ICD-10-CM | POA: Diagnosis not present

## 2016-12-21 DIAGNOSIS — H25013 Cortical age-related cataract, bilateral: Secondary | ICD-10-CM | POA: Diagnosis not present

## 2016-12-21 DIAGNOSIS — H16223 Keratoconjunctivitis sicca, not specified as Sjogren's, bilateral: Secondary | ICD-10-CM | POA: Diagnosis not present

## 2016-12-21 DIAGNOSIS — H25012 Cortical age-related cataract, left eye: Secondary | ICD-10-CM | POA: Diagnosis not present

## 2016-12-21 DIAGNOSIS — H2512 Age-related nuclear cataract, left eye: Secondary | ICD-10-CM | POA: Diagnosis not present

## 2016-12-21 DIAGNOSIS — H25043 Posterior subcapsular polar age-related cataract, bilateral: Secondary | ICD-10-CM | POA: Diagnosis not present

## 2016-12-22 ENCOUNTER — Ambulatory Visit
Admission: RE | Admit: 2016-12-22 | Discharge: 2016-12-22 | Disposition: A | Discharge status: Home / Self Care | Payer: PPO | Source: Ambulatory Visit | Attending: Primary Care | Admitting: Primary Care

## 2016-12-22 ENCOUNTER — Encounter: Payer: Self-pay | Admitting: Primary Care

## 2016-12-22 DIAGNOSIS — M8580 Other specified disorders of bone density and structure, unspecified site: Secondary | ICD-10-CM

## 2016-12-22 DIAGNOSIS — M810 Age-related osteoporosis without current pathological fracture: Secondary | ICD-10-CM | POA: Diagnosis not present

## 2016-12-22 DIAGNOSIS — R1084 Generalized abdominal pain: Secondary | ICD-10-CM

## 2016-12-22 DIAGNOSIS — Z780 Asymptomatic menopausal state: Secondary | ICD-10-CM | POA: Diagnosis not present

## 2016-12-22 NOTE — Progress Notes (Signed)
12-02-16 (EPIC) CBC w/ Diff,  CMP, and HGA1C

## 2016-12-22 NOTE — Patient Instructions (Addendum)
Shealee L Shinsato  12/22/2016   Your procedure is scheduled on: 12-31-16  Report to [place] to 3rd floor to  [place] at 5:30 AM.   Call this number if you have problems the morning of surgery [phone]    Remember: ONLY 1 PERSON MAY GO WITH YOU TO SHORT STAY TO GET  READY MORNING OF [place].  Do not eat food or drink liquids :After Midnight.     Take these medicines the morning of surgery with A SIP OF WATER: Duloxetine (Cymbalta). You may also bring and use your eyedrops as needed.                                You may not have any metal on your body including hair pins and              piercings  Do not wear jewelry, make-up, lotions, powders or perfumes, deodorant             Do not wear nail polish.  Do not shave  48 hours prior to surgery.                Do not bring valuables to the hospital. [place].  Contacts, dentures or bridgework may not be worn into surgery.  Leave suitcase in the car. After surgery it may be brought to your room.                 Please read over the following fact sheets you were given: _____________________________________________________________________             [facility] - Preparing for Surgery Before surgery, you can play an important role.  Because skin is not sterile, your skin needs to be as free of germs as possible.  You can reduce the number of germs on your skin by washing with CHG (chlorahexidine gluconate) soap before surgery.  CHG is an antiseptic cleaner which kills germs and bonds with the skin to continue killing germs even after washing. Please DO NOT use if you have an allergy to CHG or antibacterial soaps.  If your skin becomes reddened/irritated stop using the CHG and inform your nurse when you arrive at Short Stay. Do not shave (including legs and underarms) for at least 48 hours prior to the  first CHG shower.  You may shave your face/neck. Please follow these instructions carefully:  1.  Shower with CHG Soap the night before surgery and the  morning of Surgery.  2.  If you choose to wash your hair, wash your hair first as usual with your  normal  shampoo.  3.  After you shampoo, rinse your hair and body thoroughly to remove the  shampoo.                           4.  Use CHG as you would any other liquid soap.  You can apply chg directly  to the skin and wash                       Gently with a scrungie or clean washcloth.  5.  Apply the CHG Soap to your body ONLY FROM THE NECK DOWN.   Do not use on face/ open                           Wound or open sores. Avoid contact with eyes, ears mouth and genitals (private parts).                       Wash face,  Genitals (private parts) with your normal soap.             6.  Wash thoroughly, paying special attention to the area where your surgery  will be performed.  7.  Thoroughly rinse your body with warm water from the neck down.  8.  DO NOT shower/wash with your normal soap after using and rinsing off  the CHG Soap.                9.  Pat yourself dry with a clean towel.            10.  Wear clean pajamas.            11.  Place clean sheets on your bed the night of your first shower and do not  sleep with pets. Day of Surgery : Do not apply any lotions/deodorants the morning of surgery.  Please wear clean clothes to the hospital/surgery center.  FAILURE TO FOLLOW THESE INSTRUCTIONS MAY RESULT IN THE CANCELLATION OF YOUR SURGERY PATIENT SIGNATURE_________________________________  NURSE SIGNATURE__________________________________  ________________________________________________________________________

## 2016-12-23 ENCOUNTER — Encounter (HOSPITAL_COMMUNITY): Payer: Self-pay

## 2016-12-23 ENCOUNTER — Encounter (HOSPITAL_COMMUNITY)
Admission: RE | Admit: 2016-12-23 | Discharge: 2016-12-23 | Disposition: A | Discharge status: Home / Self Care | Payer: PPO | Source: Ambulatory Visit | Attending: General Surgery | Admitting: General Surgery

## 2016-12-23 ENCOUNTER — Other Ambulatory Visit: Payer: Self-pay | Admitting: Primary Care

## 2016-12-23 DIAGNOSIS — R1084 Generalized abdominal pain: Secondary | ICD-10-CM | POA: Insufficient documentation

## 2016-12-23 DIAGNOSIS — D509 Iron deficiency anemia, unspecified: Secondary | ICD-10-CM | POA: Insufficient documentation

## 2016-12-23 DIAGNOSIS — E785 Hyperlipidemia, unspecified: Secondary | ICD-10-CM | POA: Diagnosis not present

## 2016-12-23 DIAGNOSIS — Z01812 Encounter for preprocedural laboratory examination: Secondary | ICD-10-CM | POA: Insufficient documentation

## 2016-12-23 DIAGNOSIS — M8580 Other specified disorders of bone density and structure, unspecified site: Secondary | ICD-10-CM | POA: Diagnosis not present

## 2016-12-23 DIAGNOSIS — K449 Diaphragmatic hernia without obstruction or gangrene: Secondary | ICD-10-CM | POA: Diagnosis not present

## 2016-12-23 DIAGNOSIS — K649 Unspecified hemorrhoids: Secondary | ICD-10-CM | POA: Diagnosis not present

## 2016-12-23 DIAGNOSIS — M810 Age-related osteoporosis without current pathological fracture: Secondary | ICD-10-CM

## 2016-12-23 DIAGNOSIS — Z01810 Encounter for preprocedural cardiovascular examination: Secondary | ICD-10-CM | POA: Diagnosis not present

## 2016-12-23 DIAGNOSIS — I10 Essential (primary) hypertension: Secondary | ICD-10-CM | POA: Diagnosis not present

## 2016-12-23 DIAGNOSIS — I517 Cardiomegaly: Secondary | ICD-10-CM | POA: Insufficient documentation

## 2016-12-23 DIAGNOSIS — F419 Anxiety disorder, unspecified: Secondary | ICD-10-CM | POA: Diagnosis not present

## 2016-12-23 DIAGNOSIS — F329 Major depressive disorder, single episode, unspecified: Secondary | ICD-10-CM | POA: Diagnosis not present

## 2016-12-23 DIAGNOSIS — G8929 Other chronic pain: Secondary | ICD-10-CM | POA: Insufficient documentation

## 2016-12-23 MED ORDER — IBANDRONATE SODIUM 150 MG PO TABS: 150.0000 mg | ORAL_TABLET | ORAL | 3 refills | Status: DC

## 2016-12-25 ENCOUNTER — Other Ambulatory Visit (HOSPITAL_COMMUNITY): Payer: PPO

## 2016-12-29 ENCOUNTER — Encounter: Payer: Self-pay | Admitting: Neurology

## 2016-12-30 ENCOUNTER — Ambulatory Visit (HOSPITAL_COMMUNITY): Payer: PPO | Admitting: Anesthesiology

## 2016-12-30 ENCOUNTER — Encounter (HOSPITAL_COMMUNITY)
Admission: RE | Disposition: A | Discharge status: Home / Self Care | Payer: Self-pay | Source: Ambulatory Visit | Attending: General Surgery

## 2016-12-30 ENCOUNTER — Encounter (HOSPITAL_COMMUNITY): Payer: Self-pay | Admitting: Anesthesiology

## 2016-12-30 ENCOUNTER — Ambulatory Visit (HOSPITAL_COMMUNITY)
Admission: RE | Admit: 2016-12-30 | Discharge: 2016-12-30 | Disposition: A | Discharge status: Home / Self Care | Payer: PPO | Source: Ambulatory Visit | Attending: General Surgery | Admitting: General Surgery

## 2016-12-30 ENCOUNTER — Encounter (HOSPITAL_COMMUNITY): Payer: Self-pay

## 2016-12-30 DIAGNOSIS — K5730 Diverticulosis of large intestine without perforation or abscess without bleeding: Secondary | ICD-10-CM | POA: Diagnosis present

## 2016-12-30 DIAGNOSIS — Z86010 Personal history of colonic polyps: Secondary | ICD-10-CM | POA: Diagnosis not present

## 2016-12-30 DIAGNOSIS — E785 Hyperlipidemia, unspecified: Secondary | ICD-10-CM

## 2016-12-30 DIAGNOSIS — K623 Rectal prolapse: Secondary | ICD-10-CM

## 2016-12-30 DIAGNOSIS — Z87891 Personal history of nicotine dependence: Secondary | ICD-10-CM | POA: Diagnosis not present

## 2016-12-30 DIAGNOSIS — F419 Anxiety disorder, unspecified: Secondary | ICD-10-CM | POA: Diagnosis present

## 2016-12-30 DIAGNOSIS — D649 Anemia, unspecified: Secondary | ICD-10-CM | POA: Diagnosis not present

## 2016-12-30 DIAGNOSIS — Z1211 Encounter for screening for malignant neoplasm of colon: Secondary | ICD-10-CM | POA: Insufficient documentation

## 2016-12-30 DIAGNOSIS — Z79899 Other long term (current) drug therapy: Secondary | ICD-10-CM | POA: Diagnosis not present

## 2016-12-30 DIAGNOSIS — D62 Acute posthemorrhagic anemia: Secondary | ICD-10-CM | POA: Diagnosis not present

## 2016-12-30 DIAGNOSIS — D123 Benign neoplasm of transverse colon: Secondary | ICD-10-CM

## 2016-12-30 DIAGNOSIS — Q438 Other specified congenital malformations of intestine: Secondary | ICD-10-CM | POA: Diagnosis not present

## 2016-12-30 DIAGNOSIS — D509 Iron deficiency anemia, unspecified: Secondary | ICD-10-CM | POA: Diagnosis not present

## 2016-12-30 DIAGNOSIS — Z818 Family history of other mental and behavioral disorders: Secondary | ICD-10-CM | POA: Diagnosis not present

## 2016-12-30 DIAGNOSIS — Z23 Encounter for immunization: Secondary | ICD-10-CM | POA: Diagnosis present

## 2016-12-30 DIAGNOSIS — K649 Unspecified hemorrhoids: Secondary | ICD-10-CM | POA: Diagnosis not present

## 2016-12-30 DIAGNOSIS — I10 Essential (primary) hypertension: Secondary | ICD-10-CM | POA: Diagnosis present

## 2016-12-30 HISTORY — DX: Gastro-esophageal reflux disease without esophagitis: K21.9

## 2016-12-30 HISTORY — PX: COLONOSCOPY: SHX5424

## 2016-12-30 SURGERY — COLONOSCOPY
Anesthesia: Monitor Anesthesia Care

## 2016-12-30 MED ORDER — PROPOFOL 10 MG/ML IV BOLUS
INTRAVENOUS | Status: DC | PRN
  Administered 2016-12-30: 20 mg via INTRAVENOUS
  Administered 2016-12-30: 50 mg via INTRAVENOUS
  Administered 2016-12-30: 20 mg via INTRAVENOUS
  Administered 2016-12-30: 40 mg via INTRAVENOUS
  Administered 2016-12-30 (×2): 20 mg via INTRAVENOUS
  Administered 2016-12-30 (×2): 40 mg via INTRAVENOUS
  Administered 2016-12-30: 20 mg via INTRAVENOUS

## 2016-12-30 MED ORDER — PROPOFOL 10 MG/ML IV BOLUS
INTRAVENOUS | Status: AC
  Filled 2016-12-30: qty 20

## 2016-12-30 MED ORDER — PROPOFOL 10 MG/ML IV BOLUS
INTRAVENOUS | Status: AC
  Filled 2016-12-30: qty 40

## 2016-12-30 MED ORDER — LIDOCAINE 2% (20 MG/ML) 5 ML SYRINGE
INTRAMUSCULAR | Status: DC | PRN
  Administered 2016-12-30: 50 mg via INTRAVENOUS

## 2016-12-30 MED ORDER — LIDOCAINE 2% (20 MG/ML) 5 ML SYRINGE
INTRAMUSCULAR | Status: AC
  Filled 2016-12-30: qty 5

## 2016-12-30 MED ORDER — PROPOFOL 500 MG/50ML IV EMUL
INTRAVENOUS | Status: DC | PRN
  Administered 2016-12-30: 100 ug/kg/min via INTRAVENOUS

## 2016-12-30 MED ORDER — LACTATED RINGERS IV SOLN
INTRAVENOUS | Status: DC | PRN
  Administered 2016-12-30: 09:00:00 via INTRAVENOUS

## 2016-12-30 MED ORDER — SODIUM CHLORIDE 0.9 % IV SOLN: INTRAVENOUS | Status: DC

## 2016-12-30 NOTE — Transfer of Care (Signed)
Immediate Anesthesia Transfer of Care Note  Patient: [name]  Procedure(s) Performed: Procedure(s): COLONOSCOPY WITH PROPOFOL (N/A)  Patient Location: PACU  Anesthesia Type:MAC  Level of Consciousness:  sedated, patient cooperative and responds to stimulation  Airway & Oxygen Therapy:Patient Spontanous Breathing and Patient connected to face mask oxgen  Post-op Assessment:  Report given to PACU RN and Post -op Vital signs reviewed and stable  Post vital signs:  Reviewed and stable  Last Vitals:  Vitals:   12/30/16 0731 12/30/16 1050  BP: (!) 154/91 124/80  Pulse: 98 84  Resp: 20 16  Temp: 36.7 C   SpO2: 99% 872%    Complications: No apparent anesthesia complications

## 2016-12-30 NOTE — Discharge Instructions (Signed)
Post Colonoscopy Instructions  1. DIET: Take only clear liquids until your surgery.  Be sure to include lots of fluids daily.  2. You may have some mild rectal bleeding for the first few days after the procedure.  This should get less and less with time.  Resume any blood thinners 2 days after your procedure unless directed otherwise by your physician. 3. Take your usually prescribed home medications unless otherwise directed. a. If you have any pain, it is helpful to get up and walk around, as it is usually from excess gas. b. If this is not helpful, you can take an over-the-counter pain medication.  Choose one of the following that works best for you: i. Naproxen (Aleve, etc)  Two 220mg  tabs twice a day ii. Ibuprofen (Advil, etc) Three 200mg  tabs four times a day (every meal & bedtime) iii. If you still have pain after using one of these, please call the office 4. It is normal to not have a bowel movement for 2-3 days after colonoscopy.    5. ACTIVITIES as tolerated:   6. You may resume regular (light) daily activities beginning the next day--such as daily self-care, walking, climbing stairs--gradually increasing activities as tolerated.    WHEN TO CALL us [phone]: 1. Fever over 101.5 F (38.5 C)  2. Severe abdominal or chest pain  3. Large amount of rectal bleeding, passing multiple blood clots  4. Dizziness or shortness of breath 5. Increasing nausea or vomiting   The clinic staff is available to answer your questions during regular business hours (8:30am-5pm).  Please dont hesitate to call and ask to speak to one of our nurses for clinical concerns.   If you have a medical emergency, go to the nearest emergency room or call 911.  A surgeon from [facility] is always on call at the [facility], [place], [place], [place], [place]  [address] ? MAIN: [phone] ? TOLL FREE: [phone] ?  FAX [phone]  [phone] [email or web address]

## 2016-12-30 NOTE — Op Note (Signed)
[facility] Patient Name: [name] Procedure Date: 12/30/2016 MRN: [id number] Attending MD: Leighton Ruff , MD Date of Birth: 05/18/1950 CSN: [id number] Age: 66 Admit Type: Outpatient Procedure:                Colonoscopy Indications:              Surveillance: Personal history of adenomatous                            polyps on last colonoscopy > 5 years ago Providers:                Leighton Ruff, MD, Laverta Baltimore RN, RN, Alan Mulder, Technician Referring MD:              Medicines:                Propofol per Anesthesia Complications:            No immediate complications. Estimated blood loss:                            Minimal. Estimated Blood Loss:     Estimated blood loss was minimal. Procedure:                Pre-Anesthesia Assessment:                           - Prior to the procedure, a History and Physical                            was performed, and patient medications and                            allergies were reviewed. The patient's tolerance of                            previous anesthesia was also reviewed. The risks                            and benefits of the procedure and the sedation                            options and risks were discussed with the patient.                            All questions were answered, and informed consent                            was obtained. Prior Anticoagulants: The patient has                            taken no previous anticoagulant or antiplatelet                            agents. ASA  Grade Assessment: II - A patient with                            mild systemic disease. After reviewing the risks                            and benefits, the patient was deemed in                            satisfactory condition to undergo the procedure.                           After obtaining informed consent, the colonoscope                            was passed under direct  vision. Throughout the                            procedure, the patient's blood pressure, pulse, and                            oxygen saturations were monitored continuously. The                            EC-[id number] ([id number]) scope was introduced through                            the anus with the intention of advancing to the                            cecum. The scope was advanced to the transverse                            colon before the procedure was aborted. Medications                            were given. The colonoscopy was unusually difficult                            due to inadequate bowel prep, significant looping                            and a tortuous colon. Successful completion of the                            procedure was aided by increasing the dose of                            sedation medication, changing the patient to a                            supine position, withdrawing and reinserting the  scope, changing endoscopes, straightening and                            shortening the scope to obtain bowel loop reduction                            and lavage. The quality of the bowel preparation                            was inadequate. No anatomical landmarks were                            photographed. Scope In: 9:22:50 AM Scope Out: 10:43:07 AM Total Procedure Duration: 1 hour 20 minutes 17 seconds  Findings:      The perianal and digital rectal examinations were normal.      Multiple medium-mouthed diverticula were found in the sigmoid colon.      A 4 mm polyp was found in the transverse colon. The polyp was       semi-pedunculated. The polyp was removed with a cold biopsy forceps.       Resection and retrieval were complete.      A 3 mm polyp was found in the transverse colon. The polyp was sessile.       The polyp was removed with a cold snare. Resection and retrieval were       complete.      A 15 mm polyp was found in  the transverse colon. The polyp was sessile.       This was not resected. Impression:               - Diverticulosis in the sigmoid colon.                           - One 4 mm polyp in the transverse colon, removed                            with a cold biopsy forceps. Resected and retrieved.                           - One 3 mm polyp in the transverse colon, removed                            with a cold snare. Resected and retrieved. Moderate Sedation:      Moderate (conscious) sedation was personally administered by an       anesthesia professional. The following parameters were monitored: oxygen       saturation, heart rate, blood pressure, and response to care. Recommendation:           - Repeat colonoscopy in 3 months for surveillance.                           - Clear liquid diet today.                           - Continue present medications.                           -  Return to normal activities tomorrow. Procedure Code(s):        --- Professional ---                           [phone], 45, Colonoscopy, flexible; with removal of                            tumor(s), polyp(s), or other lesion(s) by snare                            technique                           45380, 59,52, Colonoscopy, flexible; with biopsy,                            single or multiple Diagnosis Code(s):        --- Professional ---                           Z86.010, Personal history of colonic polyps                           D12.3, Benign neoplasm of transverse colon (hepatic                            flexure or splenic flexure)                           K57.30, Diverticulosis of large intestine without                            perforation or abscess without bleeding CPT copyright 2016 American Medical Association. All rights reserved. The codes documented in this report are preliminary and upon coder review may  be revised to meet current compliance requirements. Leighton Ruff, MD Leighton Ruff,  MD 1/91/4782 10:56:17 AM This report has been signed electronically. Number of Addenda: 0

## 2016-12-30 NOTE — Anesthesia Preprocedure Evaluation (Addendum)
Anesthesia Evaluation  Patient identified by MRN, date of birth, ID band Patient awake    Reviewed: Allergy & Precautions, NPO status , Patient's Chart, lab work & pertinent test results  Airway Mallampati: I       Dental no notable dental hx. (+) Teeth Intact   Pulmonary former smoker,    Pulmonary exam normal breath sounds clear to auscultation       Cardiovascular hypertension, Pt. on medications Normal cardiovascular exam Rhythm:Regular Rate:Normal     Neuro/Psych PSYCHIATRIC DISORDERS Depression    GI/Hepatic   Endo/Other    Renal/GU      Musculoskeletal   Abdominal Normal abdominal exam  (+)   Peds  Hematology   Anesthesia Other Findings   Reproductive/Obstetrics                                                             Anesthesia Evaluation  Patient identified by MRN, date of birth, ID band Patient awake    Reviewed: Allergy & Precautions, NPO status , Patient's Chart, lab work & pertinent test results  Airway Mallampati: III  TM Distance: >3 FB Neck ROM: Full    Dental no notable dental hx.    Pulmonary former smoker,    Pulmonary exam normal breath sounds clear to auscultation       Cardiovascular hypertension, Pt. on medications Normal cardiovascular exam Rhythm:Regular Rate:Normal  ECG: NSR, rate 92   Neuro/Psych  Headaches, Depression    GI/Hepatic Neg liver ROS, hiatal hernia,   Endo/Other  negative endocrine ROS  Renal/GU negative Renal ROS     Musculoskeletal negative musculoskeletal ROS (+)   Abdominal   Peds  Hematology  (+) anemia ,   Anesthesia Other Findings HYPERLIPIDEMIA  Reproductive/Obstetrics                            Anesthesia Physical Anesthesia Plan  ASA: II  Anesthesia Plan: MAC   Post-op Pain Management:    Induction: Intravenous  PONV Risk Score and Plan: 2 and Propofol  infusion and Treatment may vary due to age or medical condition  Airway Management Planned: Natural Airway  Additional Equipment:   Intra-op Plan:   Post-operative Plan:   Informed Consent: I have reviewed the patients History and Physical, chart, labs and discussed the procedure including the risks, benefits and alternatives for the proposed anesthesia with the patient or authorized representative who has indicated his/her understanding and acceptance.   Dental advisory given  Plan Discussed with: CRNA  Anesthesia Plan Comments:         Anesthesia Quick Evaluation  Anesthesia Physical Anesthesia Plan  ASA: II  Anesthesia Plan: General   Post-op Pain Management:    Induction: Intravenous  PONV Risk Score and Plan: 4 or greater and Ondansetron, Dexamethasone, Midazolam, Scopolamine patch - Pre-op and Propofol infusion  Airway Management Planned: Oral ETT  Additional Equipment:   Intra-op Plan:   Post-operative Plan: Extubation in OR  Informed Consent: I have reviewed the patients History and Physical, chart, labs and discussed the procedure including the risks, benefits and alternatives for the proposed anesthesia with the patient or authorized representative who has indicated his/her understanding and acceptance.   Dental advisory given  Plan Discussed with: CRNA and Surgeon  Anesthesia Plan Comments:        Anesthesia Quick Evaluation

## 2016-12-30 NOTE — H&P (Signed)
The patient is a 66 year old female who presented to my office several years ago with occasional rectal prolapse with constipation and straining. She now has almost daily prolapse. She is having pain. Her last colonoscopy was in 2011 and several polyps were found. She was due for repeat colonoscopy in 2016.  She denies any rectal bleeding.    Problem List/Past Medical RECTAL PROLAPSE (K62.3)  Past Surgical History  Appendectomy Colon Polyp Removal - Colonoscopy Oral Surgery Spinal Surgery - Lower Back Spinal Surgery - Neck  Diagnostic Studies History  Colonoscopy 1-5 years ago Mammogram 1-3 years ago Pap Smear 1-5 years ago  Allergies  No Known Drug Allergies 03/08/2014  Medication History  Cymbalta (60MG  Capsule DR Part, Oral) Active. Desyrel (50MG  Tablet, Oral) Active. BusPIRone HCl (15MG  Tablet, Oral) Active. Lisinopril (10MG  Tablet, Oral) Active. Lisinopril-Hydrochlorothiazide (20-12.5MG  Tablet, Oral) Active. SUMAtriptan Succinate (100MG  Tablet, Oral) Active. Medications Reconciled  Social History  Alcohol use Occasional alcohol use. Caffeine use Carbonated beverages. No drug use Tobacco use Former smoker.  Family History  Arthritis Mother. Depression Mother. Migraine Headache Sister.  Pregnancy / Birth History  Age at menarche 43 years. Age of menopause 51-55 Contraceptive History Oral contraceptives. Gravida 3 Irregular periods Length (months) of breastfeeding 7-12 Maternal age 14-35 Para 3  Other Problems  Anxiety Disorder Gastric Ulcer High blood pressure     Review of Systems  General Present- Appetite Loss and Weight Gain. Not Present- Chills, Fatigue, Fever, Night Sweats and Weight Loss. Skin Not Present- Change in Wart/Mole, Dryness, Hives, Jaundice, New Lesions, Non-Healing Wounds, Rash and Ulcer. HEENT Not Present- Earache, Hearing Loss, Hoarseness, Nose Bleed, Oral Ulcers, Ringing in  the Ears, Seasonal Allergies, Sinus Pain, Sore Throat, Visual Disturbances, Wears glasses/contact lenses and Yellow Eyes. Respiratory Not Present- Bloody sputum, Chronic Cough, Difficulty Breathing, Snoring and Wheezing. Breast Not Present- Breast Mass, Breast Pain, Nipple Discharge and Skin Changes. Cardiovascular Not Present- Chest Pain, Difficulty Breathing Lying Down, Leg Cramps, Palpitations, Rapid Heart Rate, Shortness of Breath and Swelling of Extremities. Gastrointestinal Not Present- Abdominal Pain, Bloating, Bloody Stool, Change in Bowel Habits, Chronic diarrhea, Constipation, Difficulty Swallowing, Excessive gas, Gets full quickly at meals, Hemorrhoids, Indigestion, Nausea, Rectal Pain and Vomiting. Female Genitourinary Not Present- Frequency, Nocturia, Painful Urination, Pelvic Pain and Urgency. Musculoskeletal Present- Back Pain and Joint Stiffness. Not Present- Joint Pain, Muscle Pain, Muscle Weakness and Swelling of Extremities. Neurological Present- Headaches and Tingling. Not Present- Decreased Memory, Fainting, Numbness, Seizures, Tremor, Trouble walking and Weakness. Psychiatric Present- Anxiety and Change in Sleep Pattern. Not Present- Bipolar, Depression, Fearful and Frequent crying. Hematology Present- Easy Bruising. Not Present- Blood Thinners, Excessive bleeding, Gland problems, HIV and Persistent Infections.  BP (!) 154/91   Pulse 98   Temp 98.1 F (36.7 C) (Oral)   Resp 20   Ht 5\' 1"  (1.549 m)   Wt 58.1 kg (128 lb)   SpO2 98%   BMI 24.19 kg/m    Physical Exam   General Mental Status-Alert. General Appearance-Cooperative.  Chest and Lung Exam Auscultation Breath sounds - Normal.  Cardiovascular Cardiovascular examination reveals -normal heart sounds, regular rate and rhythm with no murmurs.  Abdomen Palpation/Percussion Palpation and Percussion of the abdomen reveal - Soft and Non Tender.  Rectal Note: no masses. Good rectal tone, weak  squeeze    Assessment & Plan  RECTAL PROLAPSE (K62.3) Impression: [name] is a 66 year old female who continues to have rectal prolapse. This has evolved over the last 6 months to occurring with every bowel  movement. I have reviewed a photo of her prolapse and she appears to have 3-4 cm of full-thickness prolapse. I have recommended a robotic rectopexy. She does not have any history of constipation and therefore we will not perform a sigmoid resection. She understands that she may have some difficulty with constipation after surgery but this is not guaranteed.  We will plan on doing a colonoscopy today, since she is due for this.  We have discussed risks including bleeding, perforation, inability to complete the procedure and missed pathology. The surgery and anatomy were described to the patient as well as the risks of surgery and the possible complications. These include: Bleeding, deep abdominal infections and possible wound complications such as hernia and infection, damage to adjacent structures, leak of surgical connections, which can lead to other surgeries and possibly an ostomy, possible need for other procedures, such as abscess drains in radiology, possible prolonged hospital stay, possible diarrhea from removal of part of the colon, possible constipation from narcotics, possible bowel, bladder or sexual dysfunction if having rectal surgery, prolonged fatigue/weakness or appetite loss, possible early recurrence of of disease, possible complications of their medical problems such as heart disease or arrhythmias or lung problems, death (less than 1%). I believe the patient understands and wishes to proceed with the surgery.

## 2016-12-30 NOTE — Anesthesia Postprocedure Evaluation (Signed)
Anesthesia Post Note  Patient: [name]  Procedure(s) Performed: Procedure(s) (LRB): COLONOSCOPY WITH PROPOFOL (N/A)     Patient location during evaluation: PACU Anesthesia Type: MAC Level of consciousness: awake and alert Pain management: pain level controlled Vital Signs Assessment: post-procedure vital signs reviewed and stable Respiratory status: spontaneous breathing, nonlabored ventilation, respiratory function stable and patient connected to nasal cannula oxygen Cardiovascular status: stable and blood pressure returned to baseline Anesthetic complications: no    Last Vitals:  Vitals:   12/30/16 1110 12/30/16 1120  BP: 139/88 138/86  Pulse: 84 84  Resp: 15 (!) 9  Temp:    SpO2: 100% 93%    Last Pain:  Vitals:   12/30/16 1050  TempSrc: Oral                 Kdyn Vonbehren P Ayriana Wix

## 2016-12-30 NOTE — Anesthesia Preprocedure Evaluation (Addendum)
Anesthesia Evaluation  Patient identified by MRN, date of birth, ID band Patient awake    Reviewed: Allergy & Precautions, NPO status , Patient's Chart, lab work & pertinent test results  Airway Mallampati: III  TM Distance: >3 FB Neck ROM: Full    Dental no notable dental hx.    Pulmonary former smoker,    Pulmonary exam normal breath sounds clear to auscultation       Cardiovascular hypertension, Pt. on medications Normal cardiovascular exam Rhythm:Regular Rate:Normal  ECG: NSR, rate 92   Neuro/Psych  Headaches, Depression    GI/Hepatic Neg liver ROS, hiatal hernia,   Endo/Other  negative endocrine ROS  Renal/GU negative Renal ROS     Musculoskeletal negative musculoskeletal ROS (+)   Abdominal   Peds  Hematology  (+) anemia ,   Anesthesia Other Findings HYPERLIPIDEMIA  Reproductive/Obstetrics                            Anesthesia Physical Anesthesia Plan  ASA: II  Anesthesia Plan: MAC   Post-op Pain Management:    Induction: Intravenous  PONV Risk Score and Plan: 2 and Propofol infusion and Treatment may vary due to age or medical condition  Airway Management Planned: Natural Airway  Additional Equipment:   Intra-op Plan:   Post-operative Plan:   Informed Consent: I have reviewed the patients History and Physical, chart, labs and discussed the procedure including the risks, benefits and alternatives for the proposed anesthesia with the patient or authorized representative who has indicated his/her understanding and acceptance.   Dental advisory given  Plan Discussed with: CRNA  Anesthesia Plan Comments:         Anesthesia Quick Evaluation

## 2016-12-31 ENCOUNTER — Inpatient Hospital Stay (HOSPITAL_COMMUNITY): Payer: PPO | Admitting: Anesthesiology

## 2016-12-31 ENCOUNTER — Inpatient Hospital Stay (HOSPITAL_COMMUNITY)
Admission: RE | Admit: 2016-12-31 | Discharge: 2017-01-02 | DRG: 330 | Disposition: A | Discharge status: Home / Self Care | Payer: PPO | Source: Ambulatory Visit | Attending: General Surgery | Admitting: General Surgery

## 2016-12-31 ENCOUNTER — Encounter (HOSPITAL_COMMUNITY)
Admission: RE | Disposition: A | Discharge status: Home / Self Care | Payer: Self-pay | Source: Ambulatory Visit | Attending: General Surgery

## 2016-12-31 ENCOUNTER — Encounter (HOSPITAL_COMMUNITY): Payer: Self-pay | Admitting: *Deleted

## 2016-12-31 DIAGNOSIS — Z23 Encounter for immunization: Secondary | ICD-10-CM | POA: Diagnosis present

## 2016-12-31 DIAGNOSIS — K5730 Diverticulosis of large intestine without perforation or abscess without bleeding: Secondary | ICD-10-CM | POA: Diagnosis present

## 2016-12-31 DIAGNOSIS — Z79899 Other long term (current) drug therapy: Secondary | ICD-10-CM

## 2016-12-31 DIAGNOSIS — K623 Rectal prolapse: Secondary | ICD-10-CM | POA: Diagnosis present

## 2016-12-31 DIAGNOSIS — F419 Anxiety disorder, unspecified: Secondary | ICD-10-CM | POA: Diagnosis present

## 2016-12-31 DIAGNOSIS — Z86010 Personal history of colonic polyps: Secondary | ICD-10-CM | POA: Diagnosis not present

## 2016-12-31 DIAGNOSIS — Q438 Other specified congenital malformations of intestine: Secondary | ICD-10-CM | POA: Diagnosis not present

## 2016-12-31 DIAGNOSIS — Z87891 Personal history of nicotine dependence: Secondary | ICD-10-CM

## 2016-12-31 DIAGNOSIS — D62 Acute posthemorrhagic anemia: Secondary | ICD-10-CM | POA: Diagnosis not present

## 2016-12-31 DIAGNOSIS — Z818 Family history of other mental and behavioral disorders: Secondary | ICD-10-CM | POA: Diagnosis not present

## 2016-12-31 DIAGNOSIS — D123 Benign neoplasm of transverse colon: Secondary | ICD-10-CM | POA: Diagnosis present

## 2016-12-31 DIAGNOSIS — E785 Hyperlipidemia, unspecified: Secondary | ICD-10-CM | POA: Diagnosis present

## 2016-12-31 DIAGNOSIS — I10 Essential (primary) hypertension: Secondary | ICD-10-CM | POA: Diagnosis present

## 2016-12-31 SURGERY — RECTOPEXY, ROBOT-ASSISTED
Anesthesia: General

## 2016-12-31 MED ORDER — KCL IN DEXTROSE-NACL 20-5-0.45 MEQ/L-%-% IV SOLN
INTRAVENOUS | Status: DC
  Administered 2016-12-31 – 2017-01-01 (×2): via INTRAVENOUS
  Filled 2016-12-31 (×2): qty 1000

## 2016-12-31 MED ORDER — HYDROMORPHONE HCL-NACL 0.5-0.9 MG/ML-% IV SOSY
0.2500 mg | PREFILLED_SYRINGE | INTRAVENOUS | Status: DC | PRN
  Administered 2016-12-31 (×2): 0.5 mg via INTRAVENOUS

## 2016-12-31 MED ORDER — TRAZODONE HCL 50 MG PO TABS
75.0000 mg | ORAL_TABLET | Freq: Every day | ORAL | Status: DC
  Administered 2016-12-31 – 2017-01-01 (×2): 75 mg via ORAL
  Filled 2016-12-31 (×2): qty 1

## 2016-12-31 MED ORDER — CYCLOSPORINE 0.05 % OP EMUL
1.0000 [drp] | Freq: Two times a day (BID) | OPHTHALMIC | Status: DC
  Administered 2016-12-31 – 2017-01-02 (×4): 1 [drp] via OPHTHALMIC
  Filled 2016-12-31 (×4): qty 1

## 2016-12-31 MED ORDER — BUPIVACAINE-EPINEPHRINE (PF) 0.25% -1:200000 IJ SOLN
INTRAMUSCULAR | Status: AC
  Filled 2016-12-31: qty 30

## 2016-12-31 MED ORDER — MORPHINE SULFATE (PF) 2 MG/ML IV SOLN
2.0000 mg | INTRAVENOUS | Status: DC | PRN
  Administered 2016-12-31 – 2017-01-01 (×5): 2 mg via INTRAVENOUS
  Filled 2016-12-31 (×5): qty 1

## 2016-12-31 MED ORDER — PROMETHAZINE HCL 25 MG/ML IJ SOLN: 6.2500 mg | INTRAMUSCULAR | Status: DC | PRN

## 2016-12-31 MED ORDER — HYDROMORPHONE HCL-NACL 0.5-0.9 MG/ML-% IV SOSY
PREFILLED_SYRINGE | INTRAVENOUS | Status: AC
  Filled 2016-12-31: qty 2

## 2016-12-31 MED ORDER — ACETAMINOPHEN 500 MG PO TABS
1000.0000 mg | ORAL_TABLET | Freq: Four times a day (QID) | ORAL | Status: AC
  Administered 2016-12-31 – 2017-01-01 (×3): 1000 mg via ORAL
  Filled 2016-12-31 (×3): qty 2

## 2016-12-31 MED ORDER — MEPERIDINE HCL 50 MG/ML IJ SOLN: 6.2500 mg | INTRAMUSCULAR | Status: DC | PRN

## 2016-12-31 MED ORDER — ROCURONIUM BROMIDE 50 MG/5ML IV SOSY
PREFILLED_SYRINGE | INTRAVENOUS | Status: AC
  Filled 2016-12-31: qty 5

## 2016-12-31 MED ORDER — PHENYLEPHRINE 40 MCG/ML (10ML) SYRINGE FOR IV PUSH (FOR BLOOD PRESSURE SUPPORT)
PREFILLED_SYRINGE | INTRAVENOUS | Status: DC | PRN
  Administered 2016-12-31 (×2): 40 ug via INTRAVENOUS
  Administered 2016-12-31 (×2): 80 ug via INTRAVENOUS
  Administered 2016-12-31: 120 ug via INTRAVENOUS
  Administered 2016-12-31: 40 ug via INTRAVENOUS

## 2016-12-31 MED ORDER — CEFOTETAN DISODIUM-DEXTROSE 2-2.08 GM-% IV SOLR
2.0000 g | INTRAVENOUS | Status: AC
  Administered 2016-12-31: 2 g via INTRAVENOUS
  Filled 2016-12-31: qty 50

## 2016-12-31 MED ORDER — CEFOTETAN DISODIUM 2 G IJ SOLR
2.0000 g | Freq: Two times a day (BID) | INTRAMUSCULAR | Status: AC
  Administered 2016-12-31: 2 g via INTRAVENOUS
  Filled 2016-12-31: qty 2

## 2016-12-31 MED ORDER — FENTANYL CITRATE (PF) 100 MCG/2ML IJ SOLN
INTRAMUSCULAR | Status: DC | PRN
  Administered 2016-12-31 (×7): 50 ug via INTRAVENOUS

## 2016-12-31 MED ORDER — ONDANSETRON HCL 4 MG/2ML IJ SOLN
INTRAMUSCULAR | Status: DC | PRN
  Administered 2016-12-31: 4 mg via INTRAVENOUS

## 2016-12-31 MED ORDER — THROMBIN 5000 UNITS EX SOLR
CUTANEOUS | Status: DC | PRN
  Administered 2016-12-31: 5000 [IU] via TOPICAL

## 2016-12-31 MED ORDER — SACCHAROMYCES BOULARDII 250 MG PO CAPS
250.0000 mg | ORAL_CAPSULE | Freq: Two times a day (BID) | ORAL | Status: DC
Start: 2016-12-31 — End: 2017-01-02
  Administered 2016-12-31 – 2017-01-02 (×4): 250 mg via ORAL
  Filled 2016-12-31 (×4): qty 1

## 2016-12-31 MED ORDER — HEMOSTATIC AGENTS (NO CHARGE) OPTIME
TOPICAL | Status: DC | PRN
Start: 2016-12-31 — End: 2016-12-31
  Administered 2016-12-31: 1 via TOPICAL

## 2016-12-31 MED ORDER — CELECOXIB 200 MG PO CAPS
200.0000 mg | ORAL_CAPSULE | Freq: Two times a day (BID) | ORAL | Status: DC
  Administered 2016-12-31 – 2017-01-02 (×5): 200 mg via ORAL
  Filled 2016-12-31 (×5): qty 1

## 2016-12-31 MED ORDER — FENTANYL CITRATE (PF) 250 MCG/5ML IJ SOLN
INTRAMUSCULAR | Status: AC
  Filled 2016-12-31: qty 5

## 2016-12-31 MED ORDER — ROCURONIUM BROMIDE 10 MG/ML (PF) SYRINGE
PREFILLED_SYRINGE | INTRAVENOUS | Status: DC | PRN
  Administered 2016-12-31 (×2): 10 mg via INTRAVENOUS
  Administered 2016-12-31: 50 mg via INTRAVENOUS
  Administered 2016-12-31: 20 mg via INTRAVENOUS

## 2016-12-31 MED ORDER — MIDAZOLAM HCL 2 MG/2ML IJ SOLN
INTRAMUSCULAR | Status: AC
  Filled 2016-12-31: qty 2

## 2016-12-31 MED ORDER — HYDROCHLOROTHIAZIDE 12.5 MG PO CAPS
12.5000 mg | ORAL_CAPSULE | Freq: Every day | ORAL | Status: DC
  Filled 2016-12-31: qty 1

## 2016-12-31 MED ORDER — THROMBIN 5000 UNITS EX SOLR
CUTANEOUS | Status: AC
  Filled 2016-12-31: qty 5000

## 2016-12-31 MED ORDER — DEXAMETHASONE SODIUM PHOSPHATE 10 MG/ML IJ SOLN
INTRAMUSCULAR | Status: DC | PRN
  Administered 2016-12-31: 10 mg via INTRAVENOUS

## 2016-12-31 MED ORDER — NAPHAZOLINE-GLYCERIN 0.012-0.2 % OP SOLN
1.0000 [drp] | Freq: Four times a day (QID) | OPHTHALMIC | Status: DC
  Administered 2017-01-01 (×2): 1 [drp] via OPHTHALMIC
  Administered 2017-01-01: 2 [drp] via OPHTHALMIC
  Filled 2016-12-31: qty 15

## 2016-12-31 MED ORDER — ALVIMOPAN 12 MG PO CAPS: 12.0000 mg | ORAL_CAPSULE | Freq: Two times a day (BID) | ORAL | Status: DC

## 2016-12-31 MED ORDER — GABAPENTIN 300 MG PO CAPS
300.0000 mg | ORAL_CAPSULE | Freq: Two times a day (BID) | ORAL | Status: DC
  Administered 2016-12-31 – 2017-01-02 (×4): 300 mg via ORAL
  Filled 2016-12-31 (×5): qty 1

## 2016-12-31 MED ORDER — SUGAMMADEX SODIUM 200 MG/2ML IV SOLN
INTRAVENOUS | Status: DC | PRN
  Administered 2016-12-31: 125 mg via INTRAVENOUS

## 2016-12-31 MED ORDER — KETOROLAC TROMETHAMINE 30 MG/ML IJ SOLN: 30.0000 mg | Freq: Once | INTRAMUSCULAR | Status: DC | PRN

## 2016-12-31 MED ORDER — BUPIVACAINE LIPOSOME 1.3 % IJ SUSP
20.0000 mL | Freq: Once | INTRAMUSCULAR | Status: AC
  Administered 2016-12-31: 20 mL
  Filled 2016-12-31: qty 20

## 2016-12-31 MED ORDER — METHYLENE BLUE 0.5 % INJ SOLN
INTRAVENOUS | Status: AC
  Filled 2016-12-31: qty 10

## 2016-12-31 MED ORDER — DIPHENHYDRAMINE HCL 50 MG/ML IJ SOLN: 12.5000 mg | Freq: Four times a day (QID) | INTRAMUSCULAR | Status: DC | PRN

## 2016-12-31 MED ORDER — DULOXETINE HCL 60 MG PO CPEP
60.0000 mg | ORAL_CAPSULE | Freq: Every day | ORAL | Status: DC
  Administered 2017-01-01 – 2017-01-02 (×2): 60 mg via ORAL
  Filled 2016-12-31 (×2): qty 1

## 2016-12-31 MED ORDER — ENOXAPARIN SODIUM 40 MG/0.4ML ~~LOC~~ SOLN
40.0000 mg | SUBCUTANEOUS | Status: DC
  Administered 2017-01-01: 40 mg via SUBCUTANEOUS
  Filled 2016-12-31 (×2): qty 0.4

## 2016-12-31 MED ORDER — LIDOCAINE 2% (20 MG/ML) 5 ML SYRINGE
INTRAMUSCULAR | Status: DC | PRN
  Administered 2016-12-31: 80 mg via INTRAVENOUS

## 2016-12-31 MED ORDER — MIDAZOLAM HCL 5 MG/5ML IJ SOLN
INTRAMUSCULAR | Status: DC | PRN
  Administered 2016-12-31: 2 mg via INTRAVENOUS

## 2016-12-31 MED ORDER — LISINOPRIL-HYDROCHLOROTHIAZIDE 20-12.5 MG PO TABS: 1.0000 | ORAL_TABLET | Freq: Every day | ORAL | Status: DC

## 2016-12-31 MED ORDER — ONDANSETRON HCL 4 MG PO TABS: 4.0000 mg | ORAL_TABLET | Freq: Four times a day (QID) | ORAL | Status: DC | PRN

## 2016-12-31 MED ORDER — HEPARIN SODIUM (PORCINE) 5000 UNIT/ML IJ SOLN
5000.0000 [IU] | Freq: Once | INTRAMUSCULAR | Status: AC
  Administered 2016-12-31: 5000 [IU] via SUBCUTANEOUS
  Filled 2016-12-31: qty 1

## 2016-12-31 MED ORDER — FENTANYL CITRATE (PF) 100 MCG/2ML IJ SOLN
INTRAMUSCULAR | Status: AC
  Filled 2016-12-31: qty 2

## 2016-12-31 MED ORDER — ONDANSETRON HCL 4 MG/2ML IJ SOLN: 4.0000 mg | Freq: Four times a day (QID) | INTRAMUSCULAR | Status: DC | PRN

## 2016-12-31 MED ORDER — ALUM & MAG HYDROXIDE-SIMETH 200-200-20 MG/5ML PO SUSP: 30.0000 mL | Freq: Four times a day (QID) | ORAL | Status: DC | PRN

## 2016-12-31 MED ORDER — LISINOPRIL 20 MG PO TABS
20.0000 mg | ORAL_TABLET | Freq: Every day | ORAL | Status: DC
  Filled 2016-12-31: qty 1

## 2016-12-31 MED ORDER — PROPOFOL 10 MG/ML IV BOLUS
INTRAVENOUS | Status: AC
  Filled 2016-12-31: qty 20

## 2016-12-31 MED ORDER — SUGAMMADEX SODIUM 200 MG/2ML IV SOLN
INTRAVENOUS | Status: AC
  Filled 2016-12-31: qty 2

## 2016-12-31 MED ORDER — 0.9 % SODIUM CHLORIDE (POUR BTL) OPTIME
TOPICAL | Status: DC | PRN
  Administered 2016-12-31: 2000 mL

## 2016-12-31 MED ORDER — BUPIVACAINE-EPINEPHRINE 0.25% -1:200000 IJ SOLN
INTRAMUSCULAR | Status: DC | PRN
  Administered 2016-12-31: 30 mL

## 2016-12-31 MED ORDER — DIPHENHYDRAMINE HCL 12.5 MG/5ML PO ELIX: 12.5000 mg | ORAL_SOLUTION | Freq: Four times a day (QID) | ORAL | Status: DC | PRN

## 2016-12-31 MED ORDER — LACTATED RINGERS IV SOLN
INTRAVENOUS | Status: DC | PRN
  Administered 2016-12-31 (×3): via INTRAVENOUS

## 2016-12-31 MED ORDER — HEMOSTATIC AGENTS (NO CHARGE) OPTIME
TOPICAL | Status: DC | PRN
  Administered 2016-12-31: 1 via TOPICAL

## 2016-12-31 MED ORDER — LIDOCAINE 2% (20 MG/ML) 5 ML SYRINGE
INTRAMUSCULAR | Status: AC
  Filled 2016-12-31: qty 5

## 2016-12-31 MED ORDER — LACTATED RINGERS IV SOLN: INTRAVENOUS | Status: DC

## 2016-12-31 SURGICAL SUPPLY — 102 items
APL SRG 38 LTWT LNG FL B (MISCELLANEOUS) ×1
APPLICATOR ARISTA FLEXITIP XL (MISCELLANEOUS) ×2 IMPLANT
BLADE EXTENDED COATED 6.5IN (ELECTRODE) IMPLANT
CANNULA REDUC XI 12-8 STAPL (CANNULA) ×1
CANNULA REDUC XI 12-8MM STAPL (CANNULA) ×1
CANNULA REDUCER 12-8 DVNC XI (CANNULA) ×1 IMPLANT
CELLS DAT CNTRL 66122 CELL SVR (MISCELLANEOUS) IMPLANT
CLIP VESOLOCK LG 6/CT PURPLE (CLIP) IMPLANT
CLIP VESOLOCK MED 6/CT (CLIP) IMPLANT
COVER MAYO STAND STRL (DRAPES) ×6 IMPLANT
COVER SURGICAL LIGHT HANDLE (MISCELLANEOUS) ×3 IMPLANT
COVER TIP SHEARS 8 DVNC (MISCELLANEOUS) ×1 IMPLANT
COVER TIP SHEARS 8MM DA VINCI (MISCELLANEOUS) ×2
DECANTER SPIKE VIAL GLASS SM (MISCELLANEOUS) IMPLANT
DRAIN CHANNEL 19F RND (DRAIN) ×2 IMPLANT
DRAPE ARM DVNC X/XI (DISPOSABLE) ×4 IMPLANT
DRAPE COLUMN DVNC XI (DISPOSABLE) ×1 IMPLANT
DRAPE DA VINCI XI ARM (DISPOSABLE) ×8
DRAPE DA VINCI XI COLUMN (DISPOSABLE) ×2
DRAPE SURG IRRIG POUCH 19X23 (DRAPES) ×3 IMPLANT
DRSG OPSITE POSTOP 4X10 (GAUZE/BANDAGES/DRESSINGS) IMPLANT
DRSG OPSITE POSTOP 4X6 (GAUZE/BANDAGES/DRESSINGS) IMPLANT
DRSG OPSITE POSTOP 4X8 (GAUZE/BANDAGES/DRESSINGS) IMPLANT
ELECT PENCIL ROCKER SW 15FT (MISCELLANEOUS) ×6 IMPLANT
ELECT REM PT RETURN 15FT ADLT (MISCELLANEOUS) ×3 IMPLANT
ENDOLOOP SUT PDS II  0 18 (SUTURE)
ENDOLOOP SUT PDS II 0 18 (SUTURE) IMPLANT
EVACUATOR SILICONE 100CC (DRAIN) ×2 IMPLANT
GAUZE SPONGE 4X4 12PLY STRL (GAUZE/BANDAGES/DRESSINGS) IMPLANT
GLOVE BIO SURGEON STRL SZ 6.5 (GLOVE) ×6 IMPLANT
GLOVE BIO SURGEONS STRL SZ 6.5 (GLOVE) ×3
GLOVE BIOGEL PI IND STRL 7.0 (GLOVE) ×3 IMPLANT
GLOVE BIOGEL PI INDICATOR 7.0 (GLOVE) ×6
GOWN STRL REUS W/TWL 2XL LVL3 (GOWN DISPOSABLE) ×9 IMPLANT
GOWN STRL REUS W/TWL XL LVL3 (GOWN DISPOSABLE) ×12 IMPLANT
GRASPER ENDOPATH ANVIL 10MM (MISCELLANEOUS) IMPLANT
GRASPER SUT TROCAR 14GX15 (MISCELLANEOUS) IMPLANT
HEMOSTAT ARISTA ABSORB 3G PWDR (MISCELLANEOUS) ×2 IMPLANT
HOLDER FOLEY CATH W/STRAP (MISCELLANEOUS) ×3 IMPLANT
IRRIG SUCT STRYKERFLOW 2 WTIP (MISCELLANEOUS) ×3
IRRIGATION SUCT STRKRFLW 2 WTP (MISCELLANEOUS) ×1 IMPLANT
IRRIGATOR SUCT 8 DISP DVNC XI (IRRIGATION / IRRIGATOR) ×1 IMPLANT
IRRIGATOR SUCTION 8MM XI DISP (IRRIGATION / IRRIGATOR) ×2
KIT PROCEDURE DA VINCI SI (MISCELLANEOUS) ×2
KIT PROCEDURE DVNC SI (MISCELLANEOUS) ×1 IMPLANT
NDL INSUFFLATION 14GA 120MM (NEEDLE) ×1 IMPLANT
NEEDLE INSUFFLATION 14GA 120MM (NEEDLE) ×3 IMPLANT
PACK CARDIOVASCULAR III (CUSTOM PROCEDURE TRAY) ×3 IMPLANT
PACK COLON (CUSTOM PROCEDURE TRAY) ×3 IMPLANT
PORT LAP GEL ALEXIS MED 5-9CM (MISCELLANEOUS) IMPLANT
RELOAD STAPLE 45 BLU REG DVNC (STAPLE) IMPLANT
RTRCTR WOUND ALEXIS 18CM MED (MISCELLANEOUS)
SCISSORS LAP 5X35 DISP (ENDOMECHANICALS) ×3 IMPLANT
SEAL CANN UNIV 5-8 DVNC XI (MISCELLANEOUS) ×3 IMPLANT
SEAL XI 5MM-8MM UNIVERSAL (MISCELLANEOUS) ×8
SEALER VESSEL DA VINCI XI (MISCELLANEOUS) ×2
SEALER VESSEL EXT DVNC XI (MISCELLANEOUS) ×1 IMPLANT
SLEEVE ADV FIXATION 5X100MM (TROCAR) IMPLANT
SOLUTION ELECTROLUBE (MISCELLANEOUS) ×3 IMPLANT
SPONGE SURGIFOAM ABS GEL 100 (HEMOSTASIS) ×2 IMPLANT
STAPLER 45 BLU RELOAD XI (STAPLE) ×2 IMPLANT
STAPLER 45 BLUE RELOAD XI (STAPLE) ×4
STAPLER 45 GREEN RELOAD XI (STAPLE)
STAPLER 45 GRN RELOAD XI (STAPLE) IMPLANT
STAPLER CANNULA SEAL DVNC XI (STAPLE) ×1 IMPLANT
STAPLER CANNULA SEAL XI (STAPLE) ×2
STAPLER CIRC ILS CVD 33MM 37CM (STAPLE) ×2 IMPLANT
STAPLER SHEATH (SHEATH) ×2
STAPLER SHEATH ENDOWRIST DVNC (SHEATH) ×1 IMPLANT
STAPLER VISISTAT 35W (STAPLE) IMPLANT
SUT ETHIBOND 0 36 GRN (SUTURE) ×2 IMPLANT
SUT ETHILON 2 0 PS N (SUTURE) ×2 IMPLANT
SUT NOVA NAB GS-21 0 18 T12 DT (SUTURE) ×4 IMPLANT
SUT NOVA NAB GS-21 1 T12 (SUTURE) IMPLANT
SUT PDS AB 1 CTX 36 (SUTURE) IMPLANT
SUT PDS AB 1 TP1 96 (SUTURE) IMPLANT
SUT PROLENE 2 0 KS (SUTURE) ×3 IMPLANT
SUT SILK 2 0 (SUTURE) ×3
SUT SILK 2 0 SH CR/8 (SUTURE) ×3 IMPLANT
SUT SILK 2-0 18XBRD TIE 12 (SUTURE) ×1 IMPLANT
SUT SILK 3 0 (SUTURE) ×3
SUT SILK 3 0 SH CR/8 (SUTURE) ×3 IMPLANT
SUT SILK 3-0 18XBRD TIE 12 (SUTURE) ×1 IMPLANT
SUT V-LOC BARB 180 2/0GR6 GS22 (SUTURE) ×6
SUT VIC AB 2-0 SH 18 (SUTURE) ×3 IMPLANT
SUT VIC AB 2-0 SH 27 (SUTURE) ×3
SUT VIC AB 2-0 SH 27X BRD (SUTURE) ×1 IMPLANT
SUT VIC AB 3-0 SH 18 (SUTURE) ×3 IMPLANT
SUT VIC AB 4-0 PS2 18 (SUTURE) ×6 IMPLANT
SUT VIC AB 4-0 PS2 27 (SUTURE) ×6 IMPLANT
SUT VICRYL 0 UR6 27IN ABS (SUTURE) ×3 IMPLANT
SUTURE V-LC BRB 180 2/0GR6GS22 (SUTURE) IMPLANT
SYR 10ML ECCENTRIC (SYRINGE) ×3 IMPLANT
SYS LAPSCP GELPORT 120MM (MISCELLANEOUS)
SYSTEM LAPSCP GELPORT 120MM (MISCELLANEOUS) IMPLANT
TOWEL OR 17X26 10 PK STRL BLUE (TOWEL DISPOSABLE) ×3 IMPLANT
TOWEL OR NON WOVEN STRL DISP B (DISPOSABLE) ×3 IMPLANT
TRAY FOLEY W/METER SILVER 16FR (SET/KITS/TRAYS/PACK) ×3 IMPLANT
TROCAR ADV FIXATION 5X100MM (TROCAR) ×3 IMPLANT
TUBING CONNECTING 10 (TUBING) ×4 IMPLANT
TUBING CONNECTING 10' (TUBING) ×2
TUBING INSUFFLATION 10FT LAP (TUBING) ×3 IMPLANT

## 2016-12-31 NOTE — Anesthesia Postprocedure Evaluation (Signed)
Anesthesia Post Note  Patient: [name]  Procedure(s) Performed: Procedure(s) (LRB): XI ROBOTIC ASSISTED RECTOPEXY WITH SIGMOID COLON RESECTION (N/A)     Patient location during evaluation: PACU Anesthesia Type: General Level of consciousness: awake Pain management: pain level controlled Vital Signs Assessment: post-procedure vital signs reviewed and stable Respiratory status: spontaneous breathing Cardiovascular status: stable Postop Assessment: no signs of nausea or vomiting Anesthetic complications: no    Last Vitals:  Vitals:   12/31/16 1330 12/31/16 1430  BP: 126/78 136/80  Pulse: 95 95  Resp: 14 17  Temp: 37.1 C 36.9 C  SpO2: 100% 100%    Last Pain:  Vitals:   12/31/16 1430  TempSrc: Oral  PainSc:    Pain Goal:                 Ellenie Salome JR,JOHN Janasha Barkalow

## 2016-12-31 NOTE — Interval H&P Note (Signed)
History and Physical Interval Note:  12/31/2016 7:19 AM  [name]  has presented today for surgery, with the diagnosis of rectal prolapse  The various methods of treatment have been discussed with the patient and family. After consideration of risks, benefits and other options for treatment, the patient has consented to  Procedure(s): XI ROBOTIC ASSISTED RECTOPEXY (N/A) and Sigmoid resection as a surgical intervention .  The patient's history has been reviewed, patient examined, no change in status, stable for surgery.  I have reviewed the patient's chart and labs.  Questions were answered to the patient's satisfaction.     Etta Gassett C.

## 2016-12-31 NOTE — H&P (View-Only) (Signed)
The patient is a 66 year old female who presented to my office several years ago with occasional rectal prolapse with constipation and straining. She now has almost daily prolapse. She is having pain. Her last colonoscopy was in 2011 and several polyps were found. She was due for repeat colonoscopy in 2016.  She denies any rectal bleeding.    Problem List/Past Medical RECTAL PROLAPSE (K62.3)  Past Surgical History  Appendectomy Colon Polyp Removal - Colonoscopy Oral Surgery Spinal Surgery - Lower Back Spinal Surgery - Neck  Diagnostic Studies History  Colonoscopy 1-5 years ago Mammogram 1-3 years ago Pap Smear 1-5 years ago  Allergies  No Known Drug Allergies 03/08/2014  Medication History  Cymbalta (60MG  Capsule DR Part, Oral) Active. Desyrel (50MG  Tablet, Oral) Active. BusPIRone HCl (15MG  Tablet, Oral) Active. Lisinopril (10MG  Tablet, Oral) Active. Lisinopril-Hydrochlorothiazide (20-12.5MG  Tablet, Oral) Active. SUMAtriptan Succinate (100MG  Tablet, Oral) Active. Medications Reconciled  Social History  Alcohol use Occasional alcohol use. Caffeine use Carbonated beverages. No drug use Tobacco use Former smoker.  Family History  Arthritis Mother. Depression Mother. Migraine Headache Sister.  Pregnancy / Birth History  Age at menarche 12 years. Age of menopause 51-55 Contraceptive History Oral contraceptives. Gravida 3 Irregular periods Length (months) of breastfeeding 7-12 Maternal age 59-35 Para 3  Other Problems  Anxiety Disorder Gastric Ulcer High blood pressure     Review of Systems  General Present- Appetite Loss and Weight Gain. Not Present- Chills, Fatigue, Fever, Night Sweats and Weight Loss. Skin Not Present- Change in Wart/Mole, Dryness, Hives, Jaundice, New Lesions, Non-Healing Wounds, Rash and Ulcer. HEENT Not Present- Earache, Hearing Loss, Hoarseness, Nose Bleed, Oral Ulcers, Ringing in  the Ears, Seasonal Allergies, Sinus Pain, Sore Throat, Visual Disturbances, Wears glasses/contact lenses and Yellow Eyes. Respiratory Not Present- Bloody sputum, Chronic Cough, Difficulty Breathing, Snoring and Wheezing. Breast Not Present- Breast Mass, Breast Pain, Nipple Discharge and Skin Changes. Cardiovascular Not Present- Chest Pain, Difficulty Breathing Lying Down, Leg Cramps, Palpitations, Rapid Heart Rate, Shortness of Breath and Swelling of Extremities. Gastrointestinal Not Present- Abdominal Pain, Bloating, Bloody Stool, Change in Bowel Habits, Chronic diarrhea, Constipation, Difficulty Swallowing, Excessive gas, Gets full quickly at meals, Hemorrhoids, Indigestion, Nausea, Rectal Pain and Vomiting. Female Genitourinary Not Present- Frequency, Nocturia, Painful Urination, Pelvic Pain and Urgency. Musculoskeletal Present- Back Pain and Joint Stiffness. Not Present- Joint Pain, Muscle Pain, Muscle Weakness and Swelling of Extremities. Neurological Present- Headaches and Tingling. Not Present- Decreased Memory, Fainting, Numbness, Seizures, Tremor, Trouble walking and Weakness. Psychiatric Present- Anxiety and Change in Sleep Pattern. Not Present- Bipolar, Depression, Fearful and Frequent crying. Hematology Present- Easy Bruising. Not Present- Blood Thinners, Excessive bleeding, Gland problems, HIV and Persistent Infections.  BP (!) 154/91   Pulse 98   Temp 98.1 F (36.7 C) (Oral)   Resp 20   Ht 5\' 1"  (1.549 m)   Wt 58.1 kg (128 lb)   SpO2 98%   BMI 24.19 kg/m    Physical Exam   General Mental Status-Alert. General Appearance-Cooperative.  Chest and Lung Exam Auscultation Breath sounds - Normal.  Cardiovascular Cardiovascular examination reveals -normal heart sounds, regular rate and rhythm with no murmurs.  Abdomen Palpation/Percussion Palpation and Percussion of the abdomen reveal - Soft and Non Tender.  Rectal Note: no masses. Good rectal tone, weak  squeeze    Assessment & Plan  RECTAL PROLAPSE (K62.3) Impression: Valina is a 66 year old female who continues to have rectal prolapse. This has evolved over the last 6 months to occurring with every bowel  movement. I have reviewed a photo of her prolapse and she appears to have 3-4 cm of full-thickness prolapse. I have recommended a robotic rectopexy. She does not have any history of constipation and therefore we will not perform a sigmoid resection. She understands that she may have some difficulty with constipation after surgery but this is not guaranteed.  We will plan on doing a colonoscopy today, since she is due for this.  We have discussed risks including bleeding, perforation, inability to complete the procedure and missed pathology. The surgery and anatomy were described to the patient as well as the risks of surgery and the possible complications. These include: Bleeding, deep abdominal infections and possible wound complications such as hernia and infection, damage to adjacent structures, leak of surgical connections, which can lead to other surgeries and possibly an ostomy, possible need for other procedures, such as abscess drains in radiology, possible prolonged hospital stay, possible diarrhea from removal of part of the colon, possible constipation from narcotics, possible bowel, bladder or sexual dysfunction if having rectal surgery, prolonged fatigue/weakness or appetite loss, possible early recurrence of of disease, possible complications of their medical problems such as heart disease or arrhythmias or lung problems, death (less than 1%). I believe the patient understands and wishes to proceed with the surgery.

## 2016-12-31 NOTE — Op Note (Signed)
12/31/2016  11:38 AM  PATIENT:  [name]  66 y.o. female  Patient Care Team: Pleas Koch, NP as PCP - General (Internal Medicine)  PRE-OPERATIVE DIAGNOSIS:  rectal prolapse  POST-OPERATIVE DIAGNOSIS:  rectal prolapse  PROCEDURE:   XI ROBOTIC ASSISTED RECTOPEXY WITH SIGMOID COLON RESECTION  SURGEON:  Surgeon(s): Michael Boston, MD Leighton Ruff, MD  ASSISTANT: Dr Johney Maine   ANESTHESIA:   local and general  EBL: 200 ml Total I/O In: 2000 [I.V.:2000] Out: 650 [Urine:450; Blood:200]  Delay start of Pharmacological VTE agent (>24hrs) due to surgical blood loss or risk of bleeding:  no  DRAINS: (80F) Jackson-Pratt drain(s) with closed bulb suction in the pelvis   SPECIMEN:  Source of Specimen:  Sigmoid colon  DISPOSITION OF SPECIMEN:  PATHOLOGY  COUNTS:  YES  PLAN OF CARE: Admit to inpatient   PATIENT DISPOSITION:  PACU - hemodynamically stable.  INDICATION:      I recommended segmental resection:  The anatomy & physiology of the digestive tract was discussed.  The pathophysiology was discussed.  Natural history risks without surgery was discussed.   I worked to give an overview of the disease and the frequent need to have multispecialty involvement.  I feel the risks of no intervention will lead to serious problems that outweigh the operative risks; therefore, I recommended a partial colectomy to remove the pathology.  Laparoscopic & open techniques were discussed.   Risks such as bleeding, infection, abscess, leak, reoperation, possible ostomy, hernia, heart attack, death, and other risks were discussed.  I noted a good likelihood this will help address the problem.   Goals of post-operative recovery were discussed as well.    The patient expressed understanding & wished to proceed with surgery.  OR FINDINGS:   Patient had significant inflammation in the pelvis.  The anastomosis rests 14 cm from the anal verge by rigid proctoscopy.  DESCRIPTION:    Informed consent was confirmed.  The patient underwent general anaesthesia without difficulty.  The patient was positioned appropriately.  VTE prevention in place.  The patient's abdomen was clipped, prepped, & draped in a sterile fashion.  Surgical timeout confirmed our plan.  The patient was positioned in reverse Trendelenburg.  Abdominal entry was gained using a Varies needle in the LUQ.  Entry was clean.  I induced carbon dioxide insufflation.  An 60mm robotic port was placed in the RUQ.  Camera inspection revealed no injury.  Extra ports were carefully placed under direct laparoscopic visualization.  I laparoscopically reflected the greater omentum and the upper abdomen the small bowel in the upper abdomen. The patient was appropriately positioned and the robot was docked to the patient's left side.  Instruments were placed under direct visualization.      I scored the base of peritoneum of the right side of the mesentery of the left colon from the ligament of Treitz to the peritoneal reflection of the mid rectum.  The patient had significantly redundant sigmoid colon.  I elevated the sigmoid mesentery and enetered into the retro-mesenteric plane. We were able to identify the left ureter and gonadal vessels. We kept those posterior within the retroperitoneum and elevated the left colon mesentery off that. I did isolated IMA pedicle but did not ligate it yet.  I continued distally and got into the avascular plane posterior to the mesorectum. I carried this down to the level of the coccyx.  I mobilized the peritoneal coverings towards the peritoneal reflection on both the right and left sides  of the rectum.  I could see the right and left ureters and stayed away from them. I divided the peritoneal coverings down to the peritoneal reflection.  I mobilized the mesorectum down to the level of the pelvic floor circumferentially. There was some bleeding noted at the base of the sacrum. I placed a sponge over  top of the bleeding for pressure. I came back to the several minutes later and it was still bleeding. I was able to control some of the bleeding using electrocautery. I then placed a piece of Gelfoam into the pelvis for additional hemostasis as she continued to ooze quite a bit. After coming back to the several minutes later this appeared to be controlling her bleeding quite well.   I skeletonized the inferior mesenteric artery pedicle.  After confirming the left ureter was out of the way, I went ahead and ligated the inferior mesenteric artery with bipolar robotic vessel sealer ~2cm above its takeoff from the aorta.  We ensured hemostasis. I skeletonized the mesorectum at the junction at the proximal rectum using blunt dissection & bipolar robotic vessel sealer.  I divided the proximal rectum using 2 blue load staplers. I then divided the remaining mesentery using a robotic vessel sealer.  Once this was completed I placed 2V LOC sutures and the presacral fascia just below the sacral promontory. These were then sutured to the peritoneum to fix the rectum into place.  We then undocked the robot and enlarged the 12 mm incision to make a Pfannenstiel incision for specimen and distraction. An [place] wound protector was placed.The colon was brought out through this and a pursestring device was placed on the remaining descending colon. The colon was transected. There was good mucosal bleeding noted. A 33 mm EEA anvil was then tied around the remaining colon using the pursestring suture. This was then placed back into the abdomen. The robot was then re-docked. An anastomosis was created under robotic visualization. There was no tension on the anastomosis. There was no leak when tested with insufflation under water.  I then placed 2 additional 0 Ethibond sutures to affix the distal rectum to the presacral fascia. Once this was completed a 69 [place] Blake drain was placed into the pelvis and brought out through the right  lower quadrant port site. This was secured into place with 2-0 nylon suture.  I inspected the rest of the abdomen. There was no other bleeding noted. The abdomen was irrigated with normal saline. The robot was then undocked and the ports were removed. We switched to clean gowns, gloves, instruments and drapes.  A Pfannenstiel incision was closed using a 2-0 Vicryl running suture closed the peritoneum. The fascia was closed using interrupted 0 Novafil sutures. The subcutaneous tissue was reapproximated using 2-0 Vicryl sutures and the skin was closed using a running 4-0 Vicryl suture. The port sites were also closed with 4-0 Vicryl suture and Dermabond. A dressing was placed over the extraction site. The patient was then awakened from anesthesia and sent to the postanesthesia care unit in stable condition. All counts were correct per operating room staff.  An MD assistant was necessary for tissue manipulation, retraction and positioning due to the complexity of the case and hospital policies.

## 2016-12-31 NOTE — Progress Notes (Signed)
Patient is admitted to 5W36 from PACU. Patient is AOX4. No family at the bedside.

## 2016-12-31 NOTE — Transfer of Care (Signed)
Immediate Anesthesia Transfer of Care Note  Patient: [name]  Procedure(s) Performed: Procedure(s): XI ROBOTIC ASSISTED RECTOPEXY WITH SIGMOID COLON RESECTION (N/A)  Patient Location: PACU  Anesthesia Type:General  Level of Consciousness: awake, alert  and oriented  Airway & Oxygen Therapy: Patient Spontanous Breathing and Patient connected to face mask oxygen  Post-op Assessment: Report given to RN and Post -op Vital signs reviewed and stable  Post vital signs: Reviewed and stable  Last Vitals:  Vitals:   12/31/16 0528  BP: 112/78  Pulse: 89  Resp: 16  Temp: 37.2 C  SpO2: 97%    Last Pain:  Vitals:   12/31/16 0528  TempSrc: Oral         Complications: No apparent anesthesia complications

## 2016-12-31 NOTE — Progress Notes (Signed)
Pharmacy - Brief Note  Alvimopan was ordered post-operatively but no orders were entered pre-operatively.  Since prescribing criteria requires a preop dose to be given in order to continue post-operatively, the order for alvimopan BID PO has been stopped.  Doreene Eland, PharmD, BCPS.   Pager: [phone] 12/31/2016 1:53 PM

## 2016-12-31 NOTE — Anesthesia Procedure Notes (Signed)
Procedure Name: Intubation Date/Time: 12/31/2016 7:37 AM Performed by: Noralyn Pick D Pre-anesthesia Checklist: Patient identified, Emergency Drugs available, Suction available and Patient being monitored Patient Re-evaluated:Patient Re-evaluated prior to induction Oxygen Delivery Method: Circle system utilized Preoxygenation: Pre-oxygenation with 100% oxygen Induction Type: IV induction Ventilation: Mask ventilation without difficulty Grade View: Grade I Tube size: 7.0 mm Number of attempts: 1 Airway Equipment and Method: Stylet Placement Confirmation: ETT inserted through vocal cords under direct vision,  positive ETCO2 and breath sounds checked- equal and bilateral Secured at: 20 cm Tube secured with: Tape Dental Injury: Teeth and Oropharynx as per pre-operative assessment

## 2017-01-01 ENCOUNTER — Encounter (HOSPITAL_COMMUNITY): Payer: Self-pay | Admitting: General Surgery

## 2017-01-01 LAB — BASIC METABOLIC PANEL
Anion gap: 5 (ref 5–15)
BUN: 7 mg/dL (ref 6–20)
CO2: 28 mmol/L (ref 22–32)
CREATININE: 0.79 mg/dL (ref 0.44–1.00)
Calcium: 8.2 mg/dL — ABNORMAL LOW (ref 8.9–10.3)
Chloride: 101 mmol/L (ref 101–111)
GFR calc Af Amer: >60 mL/min (ref >60)
Glucose, Bld: 113 mg/dL — ABNORMAL HIGH (ref 65–99)
Potassium: 3.8 mmol/L (ref 3.5–5.1)
SODIUM: 134 mmol/L — AB (ref 135–145)

## 2017-01-01 LAB — CBC
HCT: 26.7 % — ABNORMAL LOW (ref 36.0–46.0)
Hemoglobin: 9.4 g/dL — ABNORMAL LOW (ref 12.0–15.0)
MCH: 34.3 pg — ABNORMAL HIGH (ref 26.0–34.0)
MCHC: 35.2 g/dL (ref 30.0–36.0)
MCV: 97.4 fL (ref 78.0–100.0)
PLATELETS: 169 10*3/uL (ref 150–400)
RBC: 2.74 MIL/uL — ABNORMAL LOW (ref 3.87–5.11)
RDW: 12.4 % (ref 11.5–15.5)
WBC: 9.1 10*3/uL (ref 4.0–10.5)

## 2017-01-01 MED ORDER — HYDROCHLOROTHIAZIDE 12.5 MG PO CAPS
12.5000 mg | ORAL_CAPSULE | Freq: Every day | ORAL | Status: DC
  Administered 2017-01-01 – 2017-01-02 (×2): 12.5 mg via ORAL
  Filled 2017-01-01 (×2): qty 1

## 2017-01-01 MED ORDER — LISINOPRIL 20 MG PO TABS
20.0000 mg | ORAL_TABLET | Freq: Every day | ORAL | Status: DC
  Administered 2017-01-01 – 2017-01-02 (×2): 20 mg via ORAL
  Filled 2017-01-01 (×2): qty 1

## 2017-01-01 MED ORDER — TRAMADOL HCL 50 MG PO TABS
50.0000 mg | ORAL_TABLET | Freq: Four times a day (QID) | ORAL | Status: DC | PRN
  Administered 2017-01-01 – 2017-01-02 (×2): 100 mg via ORAL
  Filled 2017-01-01: qty 1
  Filled 2017-01-01: qty 2
  Filled 2017-01-01: qty 1

## 2017-01-01 MED ORDER — PNEUMOCOCCAL VAC POLYVALENT 25 MCG/0.5ML IJ INJ
0.5000 mL | INJECTION | INTRAMUSCULAR | Status: AC
  Administered 2017-01-02: 0.5 mL via INTRAMUSCULAR
  Filled 2017-01-01: qty 0.5

## 2017-01-01 NOTE — Discharge Instructions (Signed)

## 2017-01-01 NOTE — Progress Notes (Signed)
1 Day Post-Op robotic sigmoid resection and rectopexy Subjective: Feeling rather well, no nausea, pain controlled, passing flatus  Objective: Vital signs in last 24 hours: Temp:  [97.5 F (36.4 C)-99.3 F (37.4 C)] 98.3 F (36.8 C) (08/31 1000) Pulse Rate:  [70-100] 70 (08/31 1000) Resp:  [12-18] 18 (08/31 1000) BP: (91-143)/(53-92) 112/64 (08/31 1000) SpO2:  [94 %-100 %] 100 % (08/31 1000)   Intake/Output from previous day: 08/30 0701 - 08/31 0700 In: 3783.8 [P.O.:840; I.V.:2943.8] Out: 2915 [Urine:2225; Drains:490; Blood:200] Intake/Output this shift: Total I/O In: 240 [P.O.:240] Out: 760 [Urine:700; Drains:60]   General appearance: alert and cooperative GI: normal findings: soft, non-distended JP: SS fluid Incision: no significant drainage  Lab Results:   Recent Labs  01/01/17 0458  WBC 9.1  HGB 9.4*  HCT 26.7*  PLT 169   BMET  Recent Labs  01/01/17 0458  NA 134*  K 3.8  CL 101  CO2 28  GLUCOSE 113*  BUN 7  CREATININE 0.79  CALCIUM 8.2*   PT/INR No results for input(s): LABPROT, INR in the last 72 hours. ABG No results for input(s): PHART, HCO3 in the last 72 hours.  Invalid input(s): PCO2, PO2  MEDS, Scheduled . celecoxib  200 mg Oral BID  . cycloSPORINE  1 drop Both Eyes BID  . DULoxetine  60 mg Oral Daily  . enoxaparin (LOVENOX) injection  40 mg Subcutaneous Q24H  . gabapentin  300 mg Oral BID  . lisinopril  20 mg Oral Daily   And  . hydrochlorothiazide  12.5 mg Oral Daily  . naphazoline-glycerin  1-2 drop Right Eye QID  . [START ON 01/02/2017] pneumococcal 23 valent vaccine  0.5 mL Intramuscular Tomorrow-1000  . saccharomyces boulardii  250 mg Oral BID  . traZODone  75 mg Oral QHS    Studies/Results: No results found.  Assessment: s/p Procedure(s): XI ROBOTIC ASSISTED RECTOPEXY WITH SIGMOID COLON RESECTION Patient Active Problem List   Diagnosis Date Noted  . Rectal prolapse 12/31/2016  . Generalized abdominal pain 12/02/2016   . Medicare annual wellness visit, subsequent 12/02/2016  . Chronic pain 05/26/2016  . Chronic migraine without aura without status migrainosus, not intractable 01/12/2016  . Allergic reaction 10/22/2015  . Rectal discomfort 12/01/2012  . Hemorrhoids 12/01/2012  . COLONIC POLYPS, HX OF 11/15/2009  . Hyperlipidemia 11/14/2009  . 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 Anxiety and depression 11/14/2009  . Essential hypertension 11/14/2009  . Osteopenia 11/14/2009  . Gastrojejunal ulcer 11/06/2009  . HIATAL HERNIA 10/08/2009    Expected post op course  Plan: Advance diet  Decrease MIV Ambulate PO pain meds Acute blood loss anemia: monitor JP output, recheck Hgb in AM   LOS: 1 day     .Rosario Adie, [facility], [place]   01/01/2017 10:48 AM

## 2017-01-02 HISTORY — PX: CATARACT EXTRACTION: SUR2

## 2017-01-02 HISTORY — PX: RECTAL PROLAPSE REPAIR: SHX759

## 2017-01-02 LAB — BASIC METABOLIC PANEL
ANION GAP: 3 — AB (ref 5–15)
BUN: 7 mg/dL (ref 6–20)
CO2: 29 mmol/L (ref 22–32)
Calcium: 8.5 mg/dL — ABNORMAL LOW (ref 8.9–10.3)
Chloride: 108 mmol/L (ref 101–111)
Creatinine, Ser: 0.72 mg/dL (ref 0.44–1.00)
GFR calc Af Amer: >60 mL/min (ref >60)
GLUCOSE: 97 mg/dL (ref 65–99)
POTASSIUM: 4.2 mmol/L (ref 3.5–5.1)
Sodium: 140 mmol/L (ref 135–145)

## 2017-01-02 LAB — CBC
HEMATOCRIT: 25.6 % — AB (ref 36.0–46.0)
HEMOGLOBIN: 8.8 g/dL — AB (ref 12.0–15.0)
MCH: 34.4 pg — AB (ref 26.0–34.0)
MCHC: 34.4 g/dL (ref 30.0–36.0)
MCV: 100 fL (ref 78.0–100.0)
Platelets: 180 10*3/uL (ref 150–400)
RBC: 2.56 MIL/uL — ABNORMAL LOW (ref 3.87–5.11)
RDW: 12.7 % (ref 11.5–15.5)
WBC: 6.9 10*3/uL (ref 4.0–10.5)

## 2017-01-02 MED ORDER — TRAMADOL HCL 50 MG PO TABS: 50.0000 mg | ORAL_TABLET | Freq: Four times a day (QID) | ORAL | 0 refills | Status: DC | PRN

## 2017-01-02 NOTE — Progress Notes (Signed)
2 Days Post-Op   Subjective/Chief Complaint: No complaints. Feels great   Objective: Vital signs in last 24 hours: Temp:  [98.3 F (36.8 C)-98.7 F (37.1 C)] 98.6 F (37 C) (09/01 0550) Pulse Rate:  [70-108] 85 (09/01 0550) Resp:  [18] 18 (09/01 0550) BP: (110-123)/(64-84) 119/84 (09/01 0550) SpO2:  [98 %-100 %] 99 % (09/01 0550) Last BM Date: 01/30/17  Intake/Output from previous day: 08/31 0701 - 09/01 0700 In: 1320 [P.O.:780; I.V.:540] Out: 3206 [Urine:3000; Drains:206] Intake/Output this shift: No intake/output data recorded.  General appearance: alert and cooperative Resp: clear to auscultation bilaterally Cardio: regular rate and rhythm GI: soft, nontender. incision looks good. jp output serosang  Lab Results:   Recent Labs  01/01/17 0458 01/02/17 0515  WBC 9.1 6.9  HGB 9.4* 8.8*  HCT 26.7* 25.6*  PLT 169 180   BMET  Recent Labs  01/01/17 0458 01/02/17 0515  NA 134* 140  K 3.8 4.2  CL 101 108  CO2 28 29  GLUCOSE 113* 97  BUN 7 7  CREATININE 0.79 0.72  CALCIUM 8.2* 8.5*   PT/INR No results for input(s): LABPROT, INR in the last 72 hours. ABG No results for input(s): PHART, HCO3 in the last 72 hours.  Invalid input(s): PCO2, PO2  Studies/Results: No results found.  Anti-infectives: Anti-infectives    Start     Dose/Rate Route Frequency Ordered Stop   12/31/16 2000  cefoTEtan (CEFOTAN) 2 g in dextrose 5 % 50 mL IVPB     2 g 100 mL/hr over 30 Minutes Intravenous Every 12 hours 12/31/16 1327 12/31/16 2135   12/31/16 0521  cefoTEtan in Dextrose 5% (CEFOTAN) IVPB 2 g     2 g Intravenous On call to O.R. 12/31/16 5956 12/31/16 0739      Assessment/Plan: s/p Procedure(s): XI ROBOTIC ASSISTED RECTOPEXY WITH SIGMOID COLON RESECTION (N/A) Advance diet  D/c drain Plan for discharge today  LOS: 2 days    TOTH III,Rosaleah Person S 01/02/2017

## 2017-01-02 NOTE — Progress Notes (Signed)
Assessment unchanged. Pt and husband verbalized understanding of dc instructions through teach back regarding follow up care, incision care and when to call the doctor. Script x 1 given as provided by MD. Discharged via wc to front entrance to meet husband and awaiting vehicle to carry home. Accompanied by NT.

## 2017-01-02 NOTE — Progress Notes (Signed)
Discussed Lovenox SQ injection due this am with Dr. Marlou Starks prior to adminstration. Pt to dc home this am and have JP drain removed prior to leaving. See new order received.

## 2017-01-06 ENCOUNTER — Other Ambulatory Visit: Payer: Self-pay

## 2017-01-06 NOTE — Patient Outreach (Signed)
[facility]) Care Management  01/06/2017  [name] 02-12-1951 [id number]  Subjective: "I don't need anything".  Objective: none  Assessment: Referral received from insurance. Client with recent admission 29/5-70/7. 66 year old with recent admission for robotic assisted rectopexy with sigmoid colon resection. RNCM discussed Care management services. Also discussed transition of care program.   Client reports she is having some pain, however, states pain medication helps to relieve pain. [name] states she has her follow up appointment scheduled and she has transportation.   She reports she manages her medications without any problem.    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 She denies any issues with ADL's/IADL's. Denies any medication needs.  She declines CM services at this time. No questions or concerns voiced. No needs identified  RNCM encouraged client to call RNCM if needs change. RNCM encouraged client to call 24 hour nurse advice line as needed. Client states she has the contact number for 24 hour nurse advice line.  Plan: RNCM will not open case.  Thea Silversmith, RN, MSN, [place] Coordinator Cell: [phone]

## 2017-01-07 ENCOUNTER — Encounter: Payer: Self-pay | Admitting: Primary Care

## 2017-01-07 DIAGNOSIS — M810 Age-related osteoporosis without current pathological fracture: Secondary | ICD-10-CM

## 2017-01-07 NOTE — Telephone Encounter (Signed)
Looks like it went through [employer], is that mail order? We can resend this anywhere she'd like. Thanks.

## 2017-01-08 ENCOUNTER — Ambulatory Visit (INDEPENDENT_AMBULATORY_CARE_PROVIDER_SITE_OTHER): Payer: PPO | Admitting: Nurse Practitioner

## 2017-01-08 ENCOUNTER — Encounter: Payer: Self-pay | Admitting: Primary Care

## 2017-01-08 ENCOUNTER — Encounter: Payer: Self-pay | Admitting: Nurse Practitioner

## 2017-01-08 VITALS — BP 112/76 | HR 120 | Ht 60.75 in | Wt 134.1 lb

## 2017-01-08 DIAGNOSIS — F411 Generalized anxiety disorder: Secondary | ICD-10-CM

## 2017-01-08 DIAGNOSIS — Z86010 Personal history of colonic polyps: Secondary | ICD-10-CM

## 2017-01-08 DIAGNOSIS — R630 Anorexia: Secondary | ICD-10-CM

## 2017-01-08 MED ORDER — FAMOTIDINE 10 MG PO TABS: 10.0000 mg | ORAL_TABLET | Freq: Two times a day (BID) | ORAL | 3 refills | Status: DC

## 2017-01-08 MED ORDER — IBANDRONATE SODIUM 150 MG PO TABS: 150.0000 mg | ORAL_TABLET | ORAL | 3 refills | Status: DC

## 2017-01-08 MED ORDER — NA SULFATE-K SULFATE-MG SULF 17.5-3.13-1.6 GM/177ML PO SOLN: 1.0000 | Freq: Once | ORAL | 0 refills | Status: AC

## 2017-01-08 NOTE — Patient Instructions (Signed)
If you are age 67 or older, your body mass index should be between 23-30. Your Body mass index is 25.55 kg/m. If this is out of the aforementioned range listed, please consider follow up with your Primary Care Provider.  If you are age 60 or younger, your body mass index should be between 19-25. Your Body mass index is 25.55 kg/m. If this is out of the aformentioned range listed, please consider follow up with your Primary Care Provider.   You have been scheduled for a colonoscopy. Please follow written instructions given to you at your visit today.  Please pick up your prep supplies at the pharmacy within the next 1-3 days. If you use inhalers (even only as needed), please bring them with you on the day of your procedure. Your physician has requested that you go to www.startemmi.com and enter the access code given to you at your visit today. This web site gives a general overview about your procedure. However, you should still follow specific instructions given to you by our office regarding your preparation for the procedure.  Thank you for choosing [facility]

## 2017-01-08 NOTE — Progress Notes (Addendum)
I agree with the note, plan below.  I will communicate with Dr. Marcello Moores.            HPI: Patient is a 66 yo female known to Dr. Ardis Hughs. She is here with her husband for evaluation of appetite loss. She is s/p rectopexy with sigmoid resection 12/31/16 for rectal prolapse (Dr. Marcello Moores). She had some bright red rectal bleeding prior to surgery, none since.  Pre-op colonoscopy was attempted by Dr. Marcello Moores the day before but procedure aborted due to significant looping / tortuous colon. Two small transverse adenomatous colon polyps without HGD were removed, 3 month surveillance colonoscopy was planned.   Since the beinning of the year [name] has had a poor appetite. Even when hungry she is unable to eat much, food doesn't taste good. Stomach gurgles a lot. Admits to minor epigastric discomfort hesitant to cause it pain though and really more of a constant queasiness. She did vomit a couple of times recently. No weight loss, she has actually gained. Very stressed out with work. 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 She has a hx of NSAID related PUD years ago. She has headaches but tries to limit NSAIDs to no more than two doses a week.   In terms any recent medication changes,  PCP stopped clonazepam just prior to onset of symptoms. There was concern about potential side effects of long term use of the mediation. An alternative anxiolytic was tried but patient did not feel it controlled her anxiety. She only takes the clonazepam when absolutely necessary now but has noticed that after taking it that her nausea disappears and she is able to eat.      Past Medical History:  Diagnosis Date  . 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 Asthma   . COLONIC POLYPS, HX OF 11/15/2009  . DEPRESSION 11/14/2009  . GASTROJEJ ULCR UNS ACUT/CHRN W/O HEMOR PERF/OBST 11/06/2009  . GERD (gastroesophageal reflux disease)   . Headache(784.0) 11/15/2009  . Heart murmur   . HEART MURMUR, HX OF 11/15/2009  .  HIATAL HERNIA 10/08/2009  . HIP FRACTURE, LEFT 11/14/2009  . HYPERLIPIDEMIA 11/14/2009  . HYPERTENSION 11/14/2009  . OSTEOPOROSIS 11/14/2009  . WEIGHT GAIN 01/10/2010     Past Surgical History:  Procedure Laterality Date  . APPENDECTOMY    . AUGMENTATION MAMMAPLASTY Bilateral   . BACK SURGERY    . Bleeding ulcers  10/2009  . Broken Hip  06/2001  . COLONOSCOPY N/A 12/30/2016   Procedure: COLONOSCOPY WITH PROPOFOL;  Surgeon: Leighton Ruff, MD;  Location: [facility];  Service: Endoscopy;  Laterality: N/A;  . NECK SURGERY  04 & 06   x's 2   Family History  Problem Relation Age of Onset  . Bone cancer Mother   . Hypertension Other   . Allergic rhinitis Neg Hx   . Asthma Neg Hx   . Urticaria Neg Hx    Social History  Substance Use Topics  . Smoking status: Former Smoker    Quit date: 05/04/1982  . 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 Alcohol use Yes     Comment: occ wine    Current Outpatient Prescriptions  Medication Sig Dispense Refill  . CALCIUM PO Take 1 tablet by mouth daily.     . cycloSPORINE (RESTASIS) 0.05 % ophthalmic emulsion Place 1 drop into both eyes 2 (  two) times daily.    . DULoxetine (CYMBALTA) 60 MG capsule Take 60 mg by mouth daily.     Marland Kitchen ibandronate (BONIVA) 150 MG tablet Take 1 tablet (150 mg total) by mouth every 30 (thirty) days. 3 tablet 3  . lisinopril-hydrochlorothiazide (PRINZIDE,ZESTORETIC) 20-12.5 MG tablet Take 1 tablet by mouth daily.  2  . magnesium gluconate (MAGONATE) 500 MG tablet Take 500 mg by mouth daily.    . Multiple Vitamins-Minerals (MULTIVITAMIN PO) Take 1 tablet by mouth daily.     . SUMAtriptan (IMITREX) 100 MG tablet Take 1 tablet (100 mg total) by mouth once. May repeat in 2 hours.Max 2 tabs in one day. Max 10tabs  in one month. 10 tablet 12  . traMADol (ULTRAM) 50 MG tablet Take 1-2 tablets (50-100  mg total) by mouth every 6 (six) hours as needed for moderate pain or severe pain. 30 tablet 0  . traZODone (DESYREL) 50 MG tablet Take 1-2 tablets (50-100 mg total) by mouth at bedtime as needed for sleep. (Patient taking differently: Take 75 mg by mouth at bedtime. ) 180 tablet 1   No current facility-administered medications for this visit.    No Known Allergies   Review of Systems: Positive for back pain, fatigue, and heart murmur. All other systems reviewed and negative except where noted in HPI.    Physical Exam: BP 112/76 (BP Location: Left Arm, Patient Position: Sitting, Cuff Size: Normal)   Pulse (!) 120   Ht 5' 0.75" (1.543 m) Comment: height measured without shoes  Wt 134 lb 2 oz (60.8 kg)   BMI 25.55 kg/m  Constitutional:  Well-developed, white female in no acute distress. Psychiatric: Normal mood and affect. Behavior is normal. EENT: Pupils normal.  Conjunctivae are normal. No scleral icterus. Neck supple.  Cardiovascular: Normal rate, regular rhythm. No edema Pulmonary/chest: Effort normal and breath sounds normal. No wheezing, rales or rhonchi. Abdominal: Soft, nondistended. Nontender. Bowel sounds active throughout. There are no masses palpable. No hepatomegaly. Lymphadenopathy: No cervical adenopathy noted. Neurological: Alert and oriented to person place and time. Skin: Skin is warm and dry. No rashes noted.   ASSESSMENT AND PLAN:  1. Loss of appetite, almost an aversion to food, mild nausea. She has a hx of PUD but her current symptoms seem most likely secondary to anxietyf and patient agrees. Clonazepam discontinued around time GI symptoms started.  If she does take a dose of Clonazepam her gi symptoms resolve.  -Treat empirically for now with Pepcid twice a day. -Advised her to talk with PCP about her uncontrolled anxiety. May need referral to counseling or psychiatry. -If symptoms persist after anxiety better controlled then she will call back at which time  I think EGD will be next step.    2. Hx of colon polyps (small polyp not retrieved but looked adenomatous on 2011 colonoscopy). We sent her a colonoscopy recall letter May 2016. She had a recent sigmoid resection for rectal prolapse. Pre-op colonoscopy by Dr. Marcello Moores incomplete (to transverse) but adenomas without HGD were removed.  A 15 mm transverse colon polyp was left intact ( ? Biopsied).  -I am sure Dr. Ardis Hughs would be happy to perform the repeat colonoscopy but Dr. Marcello Moores may prefer to do it instead. I will put patient on Dr. Ardis Hughs schedule for a colonoscopy pending conversation with him and /or Dr. Daine Floras, Leticia Penna, NP .

## 2017-01-11 DIAGNOSIS — H2511 Age-related nuclear cataract, right eye: Secondary | ICD-10-CM | POA: Diagnosis not present

## 2017-01-11 NOTE — Telephone Encounter (Signed)
Pt left v/m requesting cb from Allie Bossier NP [phone] this is in re to email left in [place] message. Pt has medical questions after GI appt.

## 2017-01-11 NOTE — Telephone Encounter (Signed)
Spoke with patient regarding concerns. She saw GI who suspected most of her abdominal symptoms to be secondary to uncontrolled anxiety.  Symptoms include inability to eat, constant abdominal pain, intermittent nausea, constant anxiety.They recommend she restart her Clonazepam given that her symptoms began when she was weaned off Clonazepam in January 2018.  She has tried numerous other options without improvement. She is already on Cymbalta, so cannot add in SSRI. She could not tolerate Wellbutrin and did not respond to Buspar. Discussed best practice and that benzo's are used as needed and more of a last resort. Long discussion of side effects and potential long term effects of benzodiazepines, she verbalized understanding and would like to resume clonazepam. Agree to resume Clonazepam for quality of life, but will start low. Will start at 0.5 mg once daily. She will update in 2 weeks.  Vallarie Mare, please call in Clonazepam 0.5 mg tablets to pharmacy. Take 1 tablet by mouth once daily as needed for anxiety. #90 no refills. Also needs UDS and controlled substance contract during next visit, please make note.

## 2017-01-12 DIAGNOSIS — H2511 Age-related nuclear cataract, right eye: Secondary | ICD-10-CM | POA: Diagnosis not present

## 2017-01-12 MED ORDER — CLONAZEPAM 0.5 MG PO TABS: 0.5000 mg | ORAL_TABLET | Freq: Every day | ORAL | 0 refills | Status: DC | PRN

## 2017-01-12 NOTE — Telephone Encounter (Signed)
Called in medication to the pharmacy as instructed. 

## 2017-01-13 DIAGNOSIS — H2512 Age-related nuclear cataract, left eye: Secondary | ICD-10-CM | POA: Diagnosis not present

## 2017-01-14 ENCOUNTER — Telehealth: Payer: Self-pay

## 2017-01-14 NOTE — Telephone Encounter (Signed)
Left message on patient's voicemail regarding an appointment for colonoscopy with pre-visit in 2 1/2 months.  Per Nevin Bloodgood, I advised patient that Nevin Bloodgood, Dr. Ardis Hughs and Dr. Marcello Moores have discussed the need for colonoscopy and have agreed that she would have this done by Dr. Ardis Hughs. Requested patient call back to confirm receipt of this message. Reminder placed in chart to schedule colonoscopy mid December.  Peter Congo, [place]

## 2017-01-14 NOTE — Telephone Encounter (Signed)
[name]. Dr. Ardis Hughs sent me a message asking that patient get a double bowel prep for procedure Thanks

## 2017-01-19 DIAGNOSIS — H2512 Age-related nuclear cataract, left eye: Secondary | ICD-10-CM | POA: Diagnosis not present

## 2017-01-21 ENCOUNTER — Ambulatory Visit (INDEPENDENT_AMBULATORY_CARE_PROVIDER_SITE_OTHER): Payer: PPO | Admitting: Primary Care

## 2017-01-21 ENCOUNTER — Encounter: Payer: Self-pay | Admitting: Primary Care

## 2017-01-21 DIAGNOSIS — K289 Gastrojejunal ulcer, unspecified as acute or chronic, without hemorrhage or perforation: Secondary | ICD-10-CM

## 2017-01-21 DIAGNOSIS — F419 Anxiety disorder, unspecified: Secondary | ICD-10-CM | POA: Diagnosis not present

## 2017-01-21 DIAGNOSIS — F329 Major depressive disorder, single episode, unspecified: Secondary | ICD-10-CM

## 2017-01-21 DIAGNOSIS — F32A Depression, unspecified: Secondary | ICD-10-CM

## 2017-01-21 NOTE — Progress Notes (Signed)
Subjective:    Patient ID: [name], female    DOB: 10/29/1950, 66 y.o.   MRN: [id number]  HPI  [name] is a 66 year old female with a history of hemorrhoids, rectal prolapse, hital hernia, anxiety disorder, PUD who presents today to discuss GI symptoms and anxiety.  Has experienced significant anxiety since January 2018 when she abruptly stopped her Clonazepam. Since then had been struggling with anxiety, decrease in appetite, nausea with vomiting, inability to eat. She underwent rectal prolapse repair in late August 2018. She saw GI for follow up in early September who suspected symptoms to be secondary to uncontrolled anxiety. They recommended she start Pepcid BID and gain better control over anxiety. She will be undergoing colonoscopy soon.  We discussed her symptoms and her GI visit over the phone and decided to resume her clonazepam as she'd been successful on this for years. She's currently taking Pepcid chewable twice daily and clonazepam 0.5 mg at lunch and 0.5 mg at dinner.  Food tastes much better, she's able to eat again, significant reduction in belching, and her overall anxiety has improved. No vomiting since re-initiating Clonazepam. 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     Review of Systems  Respiratory: Negative for shortness of breath.   Cardiovascular: Negative for chest pain and palpitations.  Gastrointestinal: Negative for blood in stool, constipation, nausea and vomiting.       Intermittent abdominal discomfort, overall improved  Psychiatric/Behavioral:       Anxiety improved       Past Medical History:  Diagnosis Date  . 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 Asthma   . COLONIC POLYPS, HX OF 11/15/2009  . DEPRESSION 11/14/2009  . GASTROJEJ ULCR UNS ACUT/CHRN W/O HEMOR PERF/OBST 11/06/2009  . GERD (gastroesophageal reflux disease)   . Headache(784.0) 11/15/2009  . Heart  murmur   . HEART MURMUR, HX OF 11/15/2009  . HIATAL HERNIA 10/08/2009  . HIP FRACTURE, LEFT 11/14/2009  . HYPERLIPIDEMIA 11/14/2009  . HYPERTENSION 11/14/2009  . OSTEOPOROSIS 11/14/2009  . WEIGHT GAIN 01/10/2010     Social History   Social History  . Marital status: Married    Spouse name: [name]  . Number of children: 3  . Years of education: BA   Occupational History  . [employer]    Social History Main Topics  . Smoking status: Former Smoker    Quit date: 05/04/1982  . 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 Alcohol use Yes     Comment: occ wine   . Drug use: No  . Sexual activity: Not on file   Other Topics Concern  . Not on file   Social History Narrative   Lives with husband   Caffeine use: 1 cup or less     Past Surgical History:  Procedure Laterality Date  . APPENDECTOMY    . AUGMENTATION MAMMAPLASTY Bilateral   . BACK SURGERY    . Bleeding ulcers  10/2009  . Broken Hip  06/2001  . COLONOSCOPY N/A 12/30/2016   Procedure: COLONOSCOPY WITH PROPOFOL;  Surgeon: Leighton Ruff, MD;  Location: [facility];  Service: Endoscopy;  Laterality: N/A;  . NECK SURGERY  04 & 06   x's 2    Family History  Problem Relation  Age of Onset  . Bone cancer Mother   . Hypertension Other   . Allergic rhinitis Neg Hx   . Asthma Neg Hx   . Urticaria Neg Hx     No Known Allergies  Current Outpatient Prescriptions on File Prior to Visit  Medication Sig Dispense Refill  . CALCIUM PO Take 1 tablet by mouth daily.     . clonazePAM (KLONOPIN) 0.5 MG tablet Take 1 tablet (0.5 mg total) by mouth daily as needed for anxiety. 90 tablet 0  . cycloSPORINE (RESTASIS) 0.05 % ophthalmic emulsion Place 1 drop into both eyes 2 (two) times daily.    . DULoxetine (CYMBALTA) 60 MG capsule Take 60 mg by mouth daily.     . famotidine (PEPCID AC) 10 MG  tablet Take 1 tablet (10 mg total) by mouth 2 (two) times daily. 60 tablet 3  . ibandronate (BONIVA) 150 MG tablet Take 1 tablet (150 mg total) by mouth every 30 (thirty) days. 3 tablet 3  . lisinopril-hydrochlorothiazide (PRINZIDE,ZESTORETIC) 20-12.5 MG tablet Take 1 tablet by mouth daily.  2  . magnesium gluconate (MAGONATE) 500 MG tablet Take 500 mg by mouth daily.    . Multiple Vitamins-Minerals (MULTIVITAMIN PO) Take 1 tablet by mouth daily.     . traZODone (DESYREL) 50 MG tablet Take 1-2 tablets (50-100 mg total) by mouth at bedtime as needed for sleep. (Patient taking differently: Take 75 mg by mouth at bedtime. ) 180 tablet 1  . SUMAtriptan (IMITREX) 100 MG tablet Take 1 tablet (100 mg total) by mouth once. May repeat in 2 hours.Max 2 tabs in one day. Max 10tabs  in one month. 10 tablet 12   No current facility-administered medications on file prior to visit.     BP 116/68 (BP Location: Right Arm, Patient Position: Sitting, Cuff Size: Normal)   Pulse 92   Temp 98.7 F (37.1 C) (Oral)   Wt 132 lb (59.9 kg)   SpO2 97%   BMI 25.15 kg/m    Objective:   Physical Exam  Constitutional: She appears well-nourished.  Neck: Neck supple.  Cardiovascular: Normal rate and regular rhythm.   Pulmonary/Chest: Effort normal and breath sounds normal.  Abdominal: Soft. Bowel sounds are normal. There is no tenderness.  Skin: Skin is warm and dry.  Psychiatric: She has a normal mood and affect.          Assessment & Plan:

## 2017-01-21 NOTE — Assessment & Plan Note (Signed)
Abdominal symptoms improved since re-initiation of clonazepam. Continue Pepcid and Clonazepam PRN. Continue to monitor symptoms.

## 2017-01-21 NOTE — Assessment & Plan Note (Signed)
Abdominal symptoms have significantly improved since re-initiation of clonazepam, suspect this is also attributed to decrease in anxiety. 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   Continue Clonazepam BID PRN for now. Continue to monitor symptoms.

## 2017-01-21 NOTE — Patient Instructions (Signed)
Continue clonazepam 0.5 mg twice daily as needed.  Continue famotidine (Pepcid) once to twice daily.  Please notify me if you start to feel worse or if anything changes.  It was a pleasure to see you today!

## 2017-01-22 ENCOUNTER — Encounter: Payer: PPO | Admitting: Gastroenterology

## 2017-01-26 NOTE — Discharge Summary (Signed)
Patient ID: [name] [id number] 66 y.o. 1950/05/06  12/31/2016  Discharge date and time: 01/02/2017 10:56 AM  Admitting Physician: Rosario Adie  Discharge Physician: Rosario Adie.  Admission Diagnoses: rectal prolapse  Discharge Diagnoses: same  Operations: Procedure(s): XI ROBOTIC ASSISTED RECTOPEXY WITH SIGMOID COLON RESECTION    Discharged Condition: good    Hospital Course: pt was discharged once tolerating a diet and pain was controlled  Consults: None  Significant Diagnostic Studies: labs  Treatments: IV hydration, analgesia: acetaminophen w/ codeine and surgery: robotic sigmoid resection and rectopexy  Disposition: Home

## 2017-02-09 NOTE — Telephone Encounter (Signed)
Left message on patients cell phone to call office to set up colonoscopy [place] for mid December.  Pt needs pre-visit with Double prep per Dr. Ardis Hughs.

## 2017-02-17 ENCOUNTER — Encounter: Payer: Self-pay | Admitting: Primary Care

## 2017-02-17 NOTE — Telephone Encounter (Signed)
I have not received a response from patient regarding making an appointment for colonoscopy with Dr. Ardis Hughs in December, so I sent out a letter today requesting patient call the office to set up the appointment.

## 2017-02-18 ENCOUNTER — Encounter: Payer: Self-pay | Admitting: Primary Care

## 2017-02-18 DIAGNOSIS — R066 Hiccough: Secondary | ICD-10-CM

## 2017-02-18 MED ORDER — METOCLOPRAMIDE HCL 10 MG PO TABS: ORAL_TABLET | ORAL | 0 refills | Status: DC

## 2017-02-19 ENCOUNTER — Encounter: Payer: Self-pay | Admitting: Primary Care

## 2017-02-21 ENCOUNTER — Encounter: Payer: Self-pay | Admitting: Primary Care

## 2017-02-22 ENCOUNTER — Telehealth: Payer: Self-pay | Admitting: Neurology

## 2017-02-22 ENCOUNTER — Encounter: Payer: Self-pay | Admitting: Neurology

## 2017-02-22 ENCOUNTER — Ambulatory Visit (INDEPENDENT_AMBULATORY_CARE_PROVIDER_SITE_OTHER): Payer: PPO | Admitting: Neurology

## 2017-02-22 VITALS — BP 118/76 | HR 90 | Wt 131.1 lb

## 2017-02-22 DIAGNOSIS — G43709 Chronic migraine without aura, not intractable, without status migrainosus: Secondary | ICD-10-CM

## 2017-02-22 NOTE — Progress Notes (Signed)

## 2017-02-22 NOTE — Progress Notes (Signed)
Botox 200 units/2 vials LOT: [id number] EXP: 08/2019 NDC: 6606-0045-99 B/B //BCrn

## 2017-02-22 NOTE — Telephone Encounter (Signed)
10/22  Per Dr. Jaynee Eagles, pt needs botox in 3 mos

## 2017-02-23 ENCOUNTER — Encounter: Payer: Self-pay | Admitting: Neurology

## 2017-02-23 NOTE — Telephone Encounter (Signed)
I called and scheduled the patient for her injection.  °

## 2017-03-08 ENCOUNTER — Telehealth: Payer: Self-pay

## 2017-03-08 NOTE — Telephone Encounter (Signed)
That's great! Please let patient know!

## 2017-03-08 NOTE — Telephone Encounter (Signed)
PA Case: [id number], Status: Approved, Coverage Starts on: 03/08/2017 12:00:00 AM, Coverage Ends on: 05/03/2018 12:00:00 AM

## 2017-03-08 NOTE — Telephone Encounter (Signed)
I received an prior auth request for [employer]. I have completed and submitted the PA and should have a determination within 48-72 hours.

## 2017-03-09 MED ORDER — ERENUMAB-AOOE 70 MG/ML ~~LOC~~ SOAJ
1.0000 | SUBCUTANEOUS | 11 refills | Status: DC
Start: 2017-03-09 — End: 2017-09-01

## 2017-03-09 NOTE — Addendum Note (Signed)
Addended by: Belinda Block A on: 03/09/2017 09:49 AM   Modules accepted: Orders

## 2017-03-19 ENCOUNTER — Encounter: Payer: Self-pay | Admitting: Primary Care

## 2017-03-22 ENCOUNTER — Other Ambulatory Visit: Payer: Self-pay | Admitting: Primary Care

## 2017-03-22 NOTE — Telephone Encounter (Signed)
Received faxed refill request for clonazePAM (KLONOPIN) 0.5 MG tablet  Last prescribed on 01/12/2017. Last seen on 01/21/2017

## 2017-04-16 ENCOUNTER — Telehealth: Payer: Self-pay

## 2017-04-16 NOTE — Telephone Encounter (Signed)
-----   Message from Lowell Guitar, [place] sent at 02/17/2017  3:18 PM EDT ----- Regarding: COLONOSCOPY with Ardis Hughs Check to see if pt has scheduled colonoscopy .  NEEDS DOUBLE PREP

## 2017-04-16 NOTE — Telephone Encounter (Signed)
Documented in chart; if patient calls back to schedule colonoscopy; she will need a DOUBLE PREP.

## 2017-04-22 DIAGNOSIS — M47812 Spondylosis without myelopathy or radiculopathy, cervical region: Secondary | ICD-10-CM | POA: Diagnosis not present

## 2017-04-22 DIAGNOSIS — M47816 Spondylosis without myelopathy or radiculopathy, lumbar region: Secondary | ICD-10-CM | POA: Diagnosis not present

## 2017-04-29 ENCOUNTER — Telehealth: Payer: Self-pay | Admitting: Neurology

## 2017-04-29 DIAGNOSIS — G43709 Chronic migraine without aura, not intractable, without status migrainosus: Secondary | ICD-10-CM | POA: Diagnosis not present

## 2017-04-29 NOTE — Telephone Encounter (Signed)
I spoke with Dr. Jaynee Eagles, she recommends that pt come in for an infusion or a nerve block.  I called pt. She is agreeable to coming in for either. Dr. Jaynee Eagles recommends an infusion today. Pt can come today after 1:00pm and will bring a driver. Infusion suite made aware. Pt verbalized understanding.

## 2017-04-29 NOTE — Telephone Encounter (Signed)
Pt states that she has had migraines for the last few days and there has been no relief even with her medications, pt is asking for a call

## 2017-04-29 NOTE — Telephone Encounter (Signed)
I called pt discuss. No answer, left a message asking her to call me back. 

## 2017-05-11 ENCOUNTER — Encounter: Payer: Self-pay | Admitting: Neurology

## 2017-05-13 ENCOUNTER — Telehealth: Payer: Self-pay | Admitting: Neurology

## 2017-05-13 NOTE — Telephone Encounter (Signed)
Called and spoke with patient. She states her migraines are more frequent. She is unable to take more than 10 sumatriptan per month (per prescription) but she said she needs one everyday. She is also having nausea. Dr. Jaynee Eagles is aware and will prescribe her something until we can see her in office. I scheduled the patient for Monday 05/17/17 @ 2:00 arrival time 1:30. She verbalized appreciation and is aware that we may be closed due to potential inclement weather.

## 2017-05-13 NOTE — Telephone Encounter (Signed)
I called the patient to get her scheduled to see Dr. Jaynee Eagles tomorrow [name] 1/11. I LVM and offered her two options, 9:00 or 10:30. I asked for a call back to let us know which one she wants.

## 2017-05-13 NOTE — Telephone Encounter (Signed)
Pt returned Bethany's call. Pt is unable to accept an appt tomorrow, her husband is having a colonoscopy. Please call to advise

## 2017-05-14 ENCOUNTER — Other Ambulatory Visit: Payer: Self-pay | Admitting: Neurology

## 2017-05-14 ENCOUNTER — Encounter: Payer: Self-pay | Admitting: Neurology

## 2017-05-14 ENCOUNTER — Telehealth: Payer: Self-pay | Admitting: Neurology

## 2017-05-14 DIAGNOSIS — R51 Headache: Principal | ICD-10-CM

## 2017-05-14 DIAGNOSIS — G8929 Other chronic pain: Secondary | ICD-10-CM

## 2017-05-14 MED ORDER — METHYLPREDNISOLONE 4 MG PO TBPK: ORAL_TABLET | ORAL | 1 refills | Status: DC

## 2017-05-14 NOTE — Telephone Encounter (Signed)
[name], doesn;t appear we have ever tried a medrol dosepak with [name]. I called that in. In casae she calls thanks

## 2017-05-17 ENCOUNTER — Encounter: Payer: Self-pay | Admitting: Neurology

## 2017-05-17 ENCOUNTER — Ambulatory Visit (INDEPENDENT_AMBULATORY_CARE_PROVIDER_SITE_OTHER): Payer: PPO | Admitting: Neurology

## 2017-05-17 VITALS — BP 135/87 | HR 76 | Ht 61.0 in | Wt 138.8 lb

## 2017-05-17 DIAGNOSIS — G43709 Chronic migraine without aura, not intractable, without status migrainosus: Secondary | ICD-10-CM | POA: Diagnosis not present

## 2017-05-17 DIAGNOSIS — M7918 Myalgia, other site: Secondary | ICD-10-CM | POA: Diagnosis not present

## 2017-05-17 MED ORDER — PROPRANOLOL HCL ER 60 MG PO CP24: 60.0000 mg | ORAL_CAPSULE | Freq: Every day | ORAL | 11 refills | Status: DC

## 2017-05-17 NOTE — Progress Notes (Signed)
[id number]    Provider:  Dr Jaynee Eagles Referring Provider: Pleas Koch, NP Primary Care Physician:  Pleas Koch, NP  CC: Chronic migraines  Interval history: This is a patient with chronic intractable headaches. Botox has reduced frequency by 50% but she still has 15 headache days a month (did have 30 heahdache days prior to botox). Has had botox > 3 times still continues, has started Aimovig. She has had 3 injections of CGRP. The headaches are getting worse, lots of stress in her life. The stress is a lot better. Left side of the head, and neck. She is on Cymbalta. She doesn't snore at night, she has had negative sleep evals. She is sleeping well at night and feels rested.   Preventatives tired: Depakote, Qudexy, cymbalta(anti-depressant), topamax(anti-epileptic) this year, had side effects to topamax. Takes imitrex acutely. Tried zofran for nausea. Took lisinopril in the past which did not help her headaches (blood pressure medication),  Botox 3 times. Tried Depakote went up to 1500mg , Then Qudexy. Then generic Topiramate. Did not work/tolerate. No OTC medications. 90% on the left.  Was diagnosed with IBS and cataracts recently. She clenches her teeth at night, she uses her cpap at night. Doesn't drink a lot of caffeine daily. She has considerable neck pain and weakness, anterolisthesis of the neck, rounding of the shoulders, patient needs physical therapy for cervical myofascial pain, TMJ, forward posture, exercising for scapular stabilization, pectoral stretching and rhomboid strengthening.  JQZ:ESPQZ L Cheesemanis a 67 y.o.femalehere as a referral from Dr. Prince Rome migraines. Past medical history of anxiety, depression, insomnia, osteoporosis, chronic pain, multiple allergies, NSAID-induced gastritis, hypertension, hyperlipidemia, migraine. She has had "stress headaches" all her life denies migraines. Sister and mother with migraine. Last year she started  having new headaches shooting pain in her head all over and MRI was negative. Dr. Francesco Runner did injections to try and alleviate the headaches. The shooting pain started in the back of the head on the left but now gets shooting pain all over the head including the forehead. Last year in February she woke up with severe headache an went to an acupuncture.   For the last 7 months she has had migraines that are refractory to medications, she cannot stand light or noise, had a terrible one in February. Her stress headaches are pressure in the temples and across the forehead. The migraines Have woken her up, with light sensitivity, sound sensitivity, nausea, she vomits, she has started waking up with headaches progressively worsening in the morning she wakes up once a month or every 3 weeks with a headache and they can be debilitating. She has headaches every day tension type no light or sound sensitivity just pressure around the temples and forehead. The migrainesthat wake her up kept her at home all day. She continues to have shooting pain and tension tyope headaches and lots of stress in the neck. She does not take OTC medication, no medication overuse. Excedrin helped when she had the migraine. She has 8-61migraine daysa mont, migraines can last up to 24 hours with daily 30/30 days a month of headachea. No medication overuse. No aura.   Failed: cymbalta(anti-depressant), topamax(anti-epileptic) this year, had side effects to topamax. Takes imitrex acutely. Tried zofran for nausea. Took lisinopril in the past which did not help her headaches (blood pressure medication), Depakote, Botox x 3  Reviewed notes, labs and imaging from outside physicians, which showed:   Hemoglobin A1c 5. 12/22/2015, BUN 18 and creatinine 0.69 in May 2017,  TSH 1.8 in April 2016.  Personally reviewed MRI of the brain images and agree with the following 11/07/2014: No evidence for acute infarction, hemorrhage, mass  lesion, hydrocephalus, or extra-axial fluid. Normal for age cerebral volume. No significant white matter disease. Flow voids are maintained throughout the carotid, basilar, and vertebral arteries. There are no areas of chronic hemorrhage.  Pituitary and cerebellar tonsils unremarkable. Previous cervical fusion extends cephalad is pars C3. Mild disc space narrowing C2-C3 and mild pannus without neural impingement.  Post infusion, no abnormal enhancement of the brain or meninges.  Incompletely evaluated on this routine brain study is a T2 hyperintense lesion within the LEFT paramedian pituitary. This measures approximately 5 mm in size. It appears fairly cystic, and is unchanged from 2011. 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 If there is evidence for elevated prolactin, MR of the pituitary should be performed.  Visualized calvarium, skull base, and upper cervical osseous structures unremarkable. Scalp and extracranial soft tissues, orbits, sinuses, and mastoids show no acute process.  IMPRESSION: Stable MR brain. No acute intracranial findings.  Previous C3 through C7 cervical fusion incompletely evaluated but could serve as a source of headaches. Correlate clinically.  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     Social History   Socioeconomic History  . Marital status: Married    Spouse name: [name]  . Number of children: 3  . Years of education: BA  . Highest education level: Not on file  Social Needs  . Financial resource strain: Not on file  . Food insecurity - worry: Not on file  . Food insecurity - inability: Not on file  . Transportation needs - medical: Not on file  . Transportation needs - non-medical: Not on file  Occupational History  . Occupation: IT consultant and Spa  Tobacco Use  . Smoking status: Former Smoker    Last attempt to quit: 05/04/1982    Years  since quitting: 35.0  . Smokeless tobacco: Never Used  . 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 Alcohol use: Yes    Comment: occ wine   . Drug use: No  . Sexual activity: Not on file  Other Topics Concern  . Not on file  Social History Narrative   Lives with husband   Caffeine use: 1 cup or less    Right handed    Family History  Problem Relation Age of Onset  . Bone cancer Mother   . Hypertension Other   . Allergic rhinitis Neg Hx   . Asthma Neg Hx   . Urticaria Neg Hx     Past Medical History:  Diagnosis Date  . 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 Asthma   . COLONIC POLYPS, HX OF 11/15/2009  . DEPRESSION 11/14/2009  . GASTROJEJ ULCR UNS ACUT/CHRN W/O HEMOR PERF/OBST 11/06/2009  . GERD (gastroesophageal reflux disease)   . Headache(784.0) 11/15/2009  . Heart murmur   . HEART MURMUR, HX OF 11/15/2009  . HIATAL HERNIA 10/08/2009  . HIP FRACTURE, LEFT 11/14/2009  . HYPERLIPIDEMIA 11/14/2009  . HYPERTENSION 11/14/2009  . OSTEOPOROSIS 11/14/2009  . WEIGHT GAIN 01/10/2010    Past Surgical History:  Procedure Laterality Date  . APPENDECTOMY    . AUGMENTATION MAMMAPLASTY Bilateral   . BACK SURGERY    . Bleeding ulcers  10/2009  .  Broken Hip  06/2001  . CATARACT EXTRACTION Bilateral 01/2017  . COLONOSCOPY N/A 12/30/2016   Procedure: COLONOSCOPY WITH PROPOFOL;  Surgeon: Leighton Ruff, MD;  Location: [facility];  Service: Endoscopy;  Laterality: N/A;  . NECK SURGERY  04 & 06   x's 2  . RECTAL PROLAPSE REPAIR  01/2017    Current Outpatient Medications  Medication Sig Dispense Refill  . CALCIUM PO Take 1 tablet by mouth daily.     . clonazePAM (KLONOPIN) 0.5 MG tablet Take 1 tablet (0.5 mg total) by mouth daily as needed for anxiety. 90 tablet 0  . cycloSPORINE (RESTASIS) 0.05 % ophthalmic emulsion Place 1 drop into both eyes 2 (two)  times daily.    . DULoxetine (CYMBALTA) 60 MG capsule Take 60 mg by mouth daily.     Eduard Roux (AIMOVIG) 70 MG/ML SOAJ Inject 1 pen every 30 (thirty) days into the skin. 1 pen 11  . famotidine (PEPCID AC) 10 MG tablet Take 1 tablet (10 mg total) by mouth 2 (two) times daily. 60 tablet 3  . ibandronate (BONIVA) 150 MG tablet Take 1 tablet (150 mg total) by mouth every 30 (thirty) days. 3 tablet 3  . lisinopril-hydrochlorothiazide (PRINZIDE,ZESTORETIC) 20-12.5 MG tablet Take 1 tablet by mouth daily.  2  . magnesium gluconate (MAGONATE) 500 MG tablet Take 500 mg by mouth daily.    . methylPREDNISolone (MEDROL DOSEPAK) 4 MG TBPK tablet follow package directions 21 tablet 1  . Multiple Vitamins-Minerals (MULTIVITAMIN PO) Take 1 tablet by mouth daily.     . traZODone (DESYREL) 50 MG tablet Take 1-2 tablets (50-100 mg total) by mouth at bedtime as needed for sleep. (Patient taking differently: Take 75 mg by mouth at bedtime. ) 180 tablet 1  . SUMAtriptan (IMITREX) 100 MG tablet Take 1 tablet (100 mg total) by mouth once. May repeat in 2 hours.Max 2 tabs in one day. Max 10tabs  in one month. 10 tablet 12   No current facility-administered medications for this visit.     Allergies as of 05/17/2017  . (No Known Allergies)    Vitals: BP 135/87 (BP Location: Right Arm, Patient Position: Sitting)   Pulse 76   Ht 5\' 1"  (1.549 m)   Wt 138 lb 12.8 oz (63 kg)   BMI 26.23 kg/m  Last Weight:  Wt Readings from Last 1 Encounters:  05/17/17 138 lb 12.8 oz (63 kg)   Last Height:   Ht Readings from Last 1 Encounters:  05/17/17 5\' 1"  (1.549 m)    Physical exam: Exam: Gen: NAD, conversant, well nourised, obese, well groomed                     CV: RRR, no MRG. No Carotid Bruits. No peripheral edema, warm, nontender Eyes: Conjunctivae clear without exudates or hemorrhage  Neuro: Detailed Neurologic Exam  Speech:    Speech is normal; fluent and spontaneous with normal comprehension.   Cognition:    The patient is oriented to person, place, and time;     recent and remote memory intact;     language fluent;     normal attention, concentration,     fund of knowledge Cranial Nerves:    The pupils are equal, round, and reactive to light. The fundi are normal and spontaneous venous pulsations are present. Visual fields are full to finger confrontation. Extraocular movements are intact. Trigeminal sensation is intact and the muscles of mastication are normal. The face is symmetric. The palate elevates  in the midline. Hearing intact. Voice is normal. Shoulder shrug is normal. The tongue has normal motion without fasciculations.   Coordination:    Normal finger to nose and heel to shin. Normal rapid alternating movements.   Gait:    Heel-toe and tandem gait are normal.   Motor Observation:    No asymmetry, no atrophy, and no involuntary movements noted. Tone:    Normal muscle tone.    Posture:    Posture is normal. normal erect    Strength:    Strength is V/V in the upper and lower limbs.      Anterocollis, left elevated shoulder and rounded shouldrs (pectoral hypertrophy) she has myofascial trigger point problems (4 fingers lateral to C7 = levator scapulae) indicating posture deficits and shoulder girdle weakness causing cervicalgia.   Assessment/Plan:67 year old with chronic migraines without aura no status not intractable  Physical Therapy: Cervical myofascial pain, forward posture contributing to migraines and cervicalgia. Please evaluate and treat including dry needling, stretching, strengthening, manual therapy/massage, heating, TENS unit, exercising for scapular stabilization, pectoral stretching and rhomboid strengthening as clinically warranted as well as any other modality as recommended by evaluation.  For her TMJ: Physical therapy, headache  for an NTI brace.  Also needs botox into the masticatory muscles and temporalis   tizanidine to help with  muscles  Remember to drink plenty of fluid, eat healthy meals and do not skip any meals. Try to eat protein with a every meal and eat a healthy snack such as fruit or nuts in between meals. Try to keep a regular sleep-wake schedule and try to exercise daily, particularly in the form of walking, 20-30 minutes a day, if you can.   Migraines: Start Inderal (Propranolol) at bedtime email in a few weeks to increase  Preventatives tired: Depakote, Qudexy, cymbalta(anti-depressant), topamax(anti-epileptic) this year, had side effects to topamax. Takes imitrex acutely. Tried zofran for nausea. Took lisinopril in the past which did not help her headaches (blood pressure medication),  Sarina Ill, MD  [facility] [address] [place], [place] [address]  Phone [phone] Fax [phone]  A total of 30 minutes was spent face-to-face with this patient. Over half this time was spent on counseling patient on the chronic migraines, cervical myofascial pain diagnosis and different diagnostic and therapeutic options available.

## 2017-05-17 NOTE — Patient Instructions (Signed)
Physical therapy Start Inderal (Propranolol) at bedtime email in a few weeks to increase  Propranolol extended-release capsules What is this medicine? PROPRANOLOL (proe PRAN oh lole) is a beta-blocker. Beta-blockers reduce the workload on the heart and help it to beat more regularly. This medicine is used to treat high blood pressure, heart muscle disease, and prevent chest pain caused by angina. It is also used to prevent migraine headaches. You should not use this medicine to treat a migraine that has already started. This medicine may be used for other purposes; ask your health care provider or pharmacist if you have questions. COMMON BRAND NAME(S): Inderal LA, Inderal XL, InnoPran XL What should I tell my health care provider before I take this medicine? They need to know if you have any of these conditions: -circulation problems, or blood vessel disease -diabetes -history of heart attack or heart disease, vasospastic angina -kidney disease -liver disease -lung or breathing disease, like asthma or emphysema -pheochromocytoma -slow heart rate -thyroid disease -an unusual or allergic reaction to propranolol, other beta-blockers, medicines, foods, dyes, or preservatives -pregnant or trying to get pregnant -breast-feeding How should I use this medicine? Take this medicine by mouth with a glass of water. Follow the directions on the prescription label. Do not crush or chew. Take your doses at regular intervals. Do not take your medicine more often than directed. Do not stop taking except on the advice of your doctor or health care professional. Talk to your pediatrician regarding the use of this medicine in children. Special care may be needed. Overdosage: If you think you have taken too much of this medicine contact a poison control center or emergency room at once. NOTE: This medicine is only for you. Do not share this medicine with others. What if I miss a dose? If you miss a dose, take  it as soon as you can. If it is almost time for your next dose, take only that dose. Do not take double or extra doses. What may interact with this medicine? Do not take this medicine with any of the following medications: -feverfew -phenothiazines like chlorpromazine, mesoridazine, prochlorperazine, thioridazine This medicine may also interact with the following medications: -aluminum hydroxide gel -antipyrine -antiviral medicines for HIV or AIDS -barbiturates like phenobarbital -certain medicines for blood pressure, heart disease, irregular heart beat -cimetidine -ciprofloxacin -diazepam -fluconazole -haloperidol -isoniazid -medicines for cholesterol like cholestyramine or colestipol -medicines for mental depression -medicines for migraine headache like almotriptan, eletriptan, frovatriptan, naratriptan, rizatriptan, sumatriptan, zolmitriptan -NSAIDs, medicines for pain and inflammation, like ibuprofen or naproxen -phenytoin -rifampin -teniposide -theophylline -thyroid medicines -tolbutamide -warfarin -zileuton This list may not describe all possible interactions. Give your health care provider a list of all the medicines, herbs, non-prescription drugs, or dietary supplements you use. Also tell them if you smoke, drink alcohol, or use illegal drugs. Some items may interact with your medicine. What should I watch for while using this medicine? Visit your doctor or health care professional for regular check ups. Contact your doctor right away if your symptoms worsen. Check your blood pressure and pulse rate regularly. Ask your health care professional what your blood pressure and pulse rate should be, and when you should contact them. Do not stop taking this medicine suddenly. This could lead to serious heart-related effects. You may get drowsy or dizzy. Do not drive, use machinery, or do anything that needs mental alertness until you know how this drug affects you. Do not stand or  sit up quickly, especially if you  are an older patient. This reduces the risk of dizzy or fainting spells. Alcohol can make you more drowsy and dizzy. Avoid alcoholic drinks. This medicine can affect blood sugar levels. If you have diabetes, check with your doctor or health care professional before you change your diet or the dose of your diabetic medicine. Do not treat yourself for coughs, colds, or pain while you are taking this medicine without asking your doctor or health care professional for advice. Some ingredients may increase your blood pressure. What side effects may I notice from receiving this medicine? Side effects that you should report to your doctor or health care professional as soon as possible: -allergic reactions like skin rash, itching or hives, swelling of the face, lips, or tongue -breathing problems -changes in blood sugar -cold hands or feet -difficulty sleeping, nightmares -dry peeling skin -hallucinations -muscle cramps or weakness -slow heart rate -swelling of the legs and ankles -vomiting Side effects that usually do not require medical attention (report to your doctor or health care professional if they continue or are bothersome): -change in sex drive or performance -diarrhea -dry sore eyes -hair loss -nausea -weak or tired This list may not describe all possible side effects. Call your doctor for medical advice about side effects. You may report side effects to FDA at 1-800-FDA-1088. Where should I keep my medicine? Keep out of the reach of children. Store at room temperature between 15 and 30 degrees C (59 and 86 degrees F). Protect from light, moisture and freezing. Keep container tightly closed. Throw away any unused medicine after the expiration date. NOTE: This sheet is a summary. It may not cover all possible information. If you have questions about this medicine, talk to your doctor, pharmacist, or health care provider.  2018 Elsevier/Gold Standard  (2012-12-23 14:58:56)

## 2017-05-20 ENCOUNTER — Other Ambulatory Visit: Payer: Self-pay | Admitting: Primary Care

## 2017-05-20 DIAGNOSIS — I10 Essential (primary) hypertension: Secondary | ICD-10-CM

## 2017-05-20 NOTE — Telephone Encounter (Signed)
Refill sent to pharmacy.   

## 2017-05-20 NOTE — Telephone Encounter (Signed)
Ok to refill? Electronically refill request for lisinopril-hydrochlorothiazide (PRINZIDE,ZESTORETIC) 20-12.5 MG tablet  Medication have not been prescribed by Anda Kraft. Last seen on 01/21/2017

## 2017-05-23 ENCOUNTER — Encounter: Payer: Self-pay | Admitting: Primary Care

## 2017-05-25 ENCOUNTER — Ambulatory Visit (INDEPENDENT_AMBULATORY_CARE_PROVIDER_SITE_OTHER): Payer: PPO | Admitting: Primary Care

## 2017-05-25 ENCOUNTER — Encounter: Payer: Self-pay | Admitting: Primary Care

## 2017-05-25 DIAGNOSIS — M19072 Primary osteoarthritis, left ankle and foot: Secondary | ICD-10-CM

## 2017-05-25 DIAGNOSIS — M1990 Unspecified osteoarthritis, unspecified site: Secondary | ICD-10-CM | POA: Insufficient documentation

## 2017-05-25 NOTE — Assessment & Plan Note (Signed)
Suspect arthritis to foot. No suspicion for gout given lack of erythema and swelling, fracture given lack of injury/trauma, vascular cause given good circulation and color.  Will trial Tylenol arthritis or NSAID, instructions provided for dosing. Also recommended regular exercise/activity to keep joints healthy and moving.

## 2017-05-25 NOTE — Patient Instructions (Signed)
Your symptoms are representative of arthritis.  Remain active and exercise to prevent flares.You should be getting 150 minutes of exercise weekly.  Start with Tylenol Arthritis. Do not exceed 3000 mg in 24 hours.  You can also try Ibuprofen or Aleve for pain and inflammation. Do not exceed 1000 mg of Aleve or 2400 mg of Ibuprofen in 24 hours.  Please notify me if no improvement or if you get recurrent flares.  It was a pleasure to see you today!   Osteoarthritis Osteoarthritis is a type of arthritis that affects tissue that covers the ends of bones in joints (cartilage). Cartilage acts as a cushion between the bones and helps them move smoothly. Osteoarthritis results when cartilage in the joints gets worn down. Osteoarthritis is sometimes called "wear and tear" arthritis. Osteoarthritis is the most common form of arthritis. It often occurs in older people. It is a condition that gets worse over time (a progressive condition). Joints that are most often affected by this condition are in:  Fingers.  Toes.  Hips.  Knees.  Spine, including neck and lower back.  What are the causes? This condition is caused by age-related wearing down of cartilage that covers the ends of bones. What increases the risk? The following factors may make you more likely to develop this condition:  Older age.  Being overweight or obese.  Overuse of joints, such as in athletes.  Past injury of a joint.  Past surgery on a joint.  Family history of osteoarthritis.  What are the signs or symptoms? The main symptoms of this condition are pain, swelling, and stiffness in the joint. The joint may lose its shape over time. Small pieces of bone or cartilage may break off and float inside of the joint, which may cause more pain and damage to the joint. Small deposits of bone (osteophytes) may grow on the edges of the joint. Other symptoms may include:  A grating or scraping feeling inside the joint when  you move it.  Popping or creaking sounds when you move.  Symptoms may affect one or more joints. Osteoarthritis in a major joint, such as your knee or hip, can make it painful to walk or exercise. If you have osteoarthritis in your hands, you might not be able to grip items, twist your hand, or control small movements of your hands and fingers (fine motor skills). How is this diagnosed? This condition may be diagnosed based on:  Your medical history.  A physical exam.  Your symptoms.  X-rays of the affected joint(s).  Blood tests to rule out other types of arthritis.  How is this treated? There is no cure for this condition, but treatment can help to control pain and improve joint function. Treatment plans may include:  A prescribed exercise program that allows for rest and joint relief. You may work with a physical therapist.  A weight control plan.  Pain relief techniques, such as: ? Applying heat and cold to the joint. ? Electric pulses delivered to nerve endings under the skin (transcutaneous electrical nerve stimulation, or TENS). ? Massage. ? Certain nutritional supplements.  NSAIDs or prescription medicines to help relieve pain.  Medicine to help relieve pain and inflammation (corticosteroids). This can be given by mouth (orally) or as an injection.  Assistive devices, such as a brace, wrap, splint, specialized glove, or cane.  Surgery, such as: ? An osteotomy. This is done to reposition the bones and relieve pain or to remove loose pieces of bone and  cartilage. ? Joint replacement surgery. You may need this surgery if you have very bad (advanced) osteoarthritis.  Follow these instructions at home: Activity  Rest your affected joints as directed by your health care provider.  Do not drive or use heavy machinery while taking prescription pain medicine.  Exercise as directed. Your health care provider or physical therapist may recommend specific types of exercise,  such as: ? Strengthening exercises. These are done to strengthen the muscles that support joints that are affected by arthritis. They can be performed with weights or with exercise bands to add resistance. ? Aerobic activities. These are exercises, such as brisk walking or water aerobics, that get your heart pumping. ? Range-of-motion activities. These keep your joints easy to move. ? Balance and agility exercises. Managing pain, stiffness, and swelling  If directed, apply heat to the affected area as often as told by your health care provider. Use the heat source that your health care provider recommends, such as a moist heat pack or a heating pad. ? If you have a removable assistive device, remove it as told by your health care provider. ? Place a towel between your skin and the heat source. If your health care provider tells you to keep the assistive device on while you apply heat, place a towel between the assistive device and the heat source. ? Leave the heat on for 20-30 minutes. ? Remove the heat if your skin turns bright red. This is especially important if you are unable to feel pain, heat, or cold. You may have a greater risk of getting burned.  If directed, put ice on the affected joint: ? If you have a removable assistive device, remove it as told by your health care provider. ? Put ice in a plastic bag. ? Place a towel between your skin and the bag. If your health care provider tells you to keep the assistive device on during icing, place a towel between the assistive device and the bag. ? Leave the ice on for 20 minutes, 2-3 times a day. General instructions  Take over-the-counter and prescription medicines only as told by your health care provider.  Maintain a healthy weight. Follow instructions from your health care provider for weight control. These may include dietary restrictions.  Do not use any products that contain nicotine or tobacco, such as cigarettes and  e-cigarettes. These can delay bone healing. If you need help quitting, ask your health care provider.  Use assistive devices as directed by your health care provider.  Keep all follow-up visits as told by your health care provider. This is important. Where to find more information:  [employer] of Arthritis and Musculoskeletal and Skin Diseases: www.niams.SouthExposed.es  [employer] on Aging: http://kim-miller.com/  American College of Rheumatology: www.rheumatology.org Contact a health care provider if:  Your skin turns red.  You develop a rash.  You have pain that gets worse.  You have a fever along with joint or muscle aches. Get help right away if:  You lose a lot of weight.  You suddenly lose your appetite.  You have night sweats. Summary  Osteoarthritis is a type of arthritis that affects tissue covering the ends of bones in joints (cartilage).  This condition is caused by age-related wearing down of cartilage that covers the ends of bones.  The main symptom of this condition is pain, swelling, and stiffness in the joint.  There is no cure for this condition, but treatment can help to control pain and improve  joint function. This information is not intended to replace advice given to you by your health care provider. Make sure you discuss any questions you have with your health care provider. Document Released: 04/20/2005 Document Revised: 12/23/2015 Document Reviewed: 12/23/2015 Elsevier Interactive Patient Education  [employer].

## 2017-05-25 NOTE — Progress Notes (Signed)
Subjective:    Patient ID: [name], female    DOB: January 08, 1951, 67 y.o.   MRN: [id number]  HPI  [name] is a 67 year old female with a history of chronic back pain who presents today with a chief complaint of foot stiffness.   Her stiffness is located to the left foot on the dorsal side at the base of her second and third toes which will cause bouts of pain. Her stiffness/pain has been present for the last several weeks. She denies erythema, swelling, injury/trauma, radiation of pain. She's not taken anything OTC for her symptoms. She is much less active since retiring in October 2018.  Review of Systems  Constitutional: Negative for fever.  Musculoskeletal: Positive for arthralgias. Negative for joint swelling.  Skin: Negative for color change and wound.       Past Medical History:  Diagnosis Date  . 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 Asthma   . COLONIC POLYPS, HX OF 11/15/2009  . DEPRESSION 11/14/2009  . GASTROJEJ ULCR UNS ACUT/CHRN W/O HEMOR PERF/OBST 11/06/2009  . GERD (gastroesophageal reflux disease)   . Headache(784.0) 11/15/2009  . Heart murmur   . HEART MURMUR, HX OF 11/15/2009  . HIATAL HERNIA 10/08/2009  . HIP FRACTURE, LEFT 11/14/2009  . HYPERLIPIDEMIA 11/14/2009  . HYPERTENSION 11/14/2009  . OSTEOPOROSIS 11/14/2009  . WEIGHT GAIN 01/10/2010     Social History   Socioeconomic History  . Marital status: Married    Spouse name: [name]  . Number of children: 3  . Years of education: BA  . Highest education level: Not on file  Social Needs  . Financial resource strain: Not on file  . Food insecurity - worry: Not on file  . Food insecurity - inability: Not on file  . Transportation needs - medical: Not on file  . Transportation needs - non-medical: Not on file  Occupational History  . Occupation: IT consultant and Spa  Tobacco Use  . Smoking status: Former Smoker    Last attempt to quit: 05/04/1982    Years since quitting: 35.0  . Smokeless tobacco: Never  Used  . 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 Alcohol use: Yes    Comment: occ wine   . Drug use: No  . Sexual activity: Not on file  Other Topics Concern  . Not on file  Social History Narrative   Lives with husband   Caffeine use: 1 cup or less    Right handed    Past Surgical History:  Procedure Laterality Date  . APPENDECTOMY    . AUGMENTATION MAMMAPLASTY Bilateral   . BACK SURGERY    . Bleeding ulcers  10/2009  . Broken Hip  06/2001  . CATARACT EXTRACTION Bilateral 01/2017  . COLONOSCOPY N/A 12/30/2016   Procedure: COLONOSCOPY WITH PROPOFOL;  Surgeon: Leighton Ruff, MD;  Location: [facility];  Service: Endoscopy;  Laterality: N/A;  . NECK SURGERY  04 & 06   x's 2  . RECTAL PROLAPSE REPAIR  01/2017    Family History  Problem Relation Age of Onset  . Bone cancer Mother   . Hypertension Other   . Allergic rhinitis Neg Hx   . Asthma Neg Hx   . Urticaria Neg Hx     No Known Allergies  Current Outpatient Medications on File Prior to Visit  Medication Sig Dispense Refill  .  CALCIUM PO Take 1 tablet by mouth daily.     . clonazePAM (KLONOPIN) 0.5 MG tablet Take 1 tablet (0.5 mg total) by mouth daily as needed for anxiety. 90 tablet 0  . cycloSPORINE (RESTASIS) 0.05 % ophthalmic emulsion Place 1 drop into both eyes 2 (two) times daily.    . DULoxetine (CYMBALTA) 60 MG capsule Take 60 mg by mouth daily.     Eduard Roux (AIMOVIG) 70 MG/ML SOAJ Inject 1 pen every 30 (thirty) days into the skin. 1 pen 11  . famotidine (PEPCID AC) 10 MG tablet Take 1 tablet (10 mg total) by mouth 2 (two) times daily. 60 tablet 3  . ibandronate (BONIVA) 150 MG tablet Take 1 tablet (150 mg total) by mouth every 30 (thirty) days. 3 tablet 3  . lisinopril-hydrochlorothiazide (PRINZIDE,ZESTORETIC) 20-12.5 MG tablet TAKE 1 TABLET BY MOUTH DAILY. 90  tablet 2  . magnesium gluconate (MAGONATE) 500 MG tablet Take 500 mg by mouth daily.    . methylPREDNISolone (MEDROL DOSEPAK) 4 MG TBPK tablet follow package directions 21 tablet 1  . Multiple Vitamins-Minerals (MULTIVITAMIN PO) Take 1 tablet by mouth daily.     . propranolol ER (INDERAL LA) 60 MG 24 hr capsule Take 1 capsule (60 mg total) by mouth daily. 30 capsule 11  . traZODone (DESYREL) 50 MG tablet Take 1-2 tablets (50-100 mg total) by mouth at bedtime as needed for sleep. (Patient taking differently: Take 75 mg by mouth at bedtime. ) 180 tablet 1  . SUMAtriptan (IMITREX) 100 MG tablet Take 1 tablet (100 mg total) by mouth once. May repeat in 2 hours.Max 2 tabs in one day. Max 10tabs  in one month. 10 tablet 12   No current facility-administered medications on file prior to visit.     BP 116/70   Pulse 76   Temp 98 F (36.7 C) (Oral)   Ht 5\' 1"  (1.549 m)   Wt 140 lb 6.4 oz (63.7 kg)   SpO2 98%   BMI 26.53 kg/m    Objective:   Physical Exam  Cardiovascular: Normal rate and regular rhythm.  Pulses:      Dorsalis pedis pulses are 2+ on the right side, and 2+ on the left side.       Posterior tibial pulses are 2+ on the right side, and 2+ on the left side.  Pulmonary/Chest: Effort normal and breath sounds normal.  Musculoskeletal:       Left foot: There is normal range of motion, no tenderness, no bony tenderness and no swelling.       Feet:  No deformity, swelling, decrease in ROM.  Skin: Skin is warm and dry. No erythema.          Assessment & Plan:

## 2017-05-26 ENCOUNTER — Ambulatory Visit (INDEPENDENT_AMBULATORY_CARE_PROVIDER_SITE_OTHER): Payer: PPO | Admitting: Neurology

## 2017-05-26 ENCOUNTER — Telehealth: Payer: Self-pay | Admitting: Neurology

## 2017-05-26 ENCOUNTER — Encounter: Payer: Self-pay | Admitting: Neurology

## 2017-05-26 VITALS — BP 132/82 | HR 62

## 2017-05-26 DIAGNOSIS — G43709 Chronic migraine without aura, not intractable, without status migrainosus: Secondary | ICD-10-CM

## 2017-05-26 NOTE — Telephone Encounter (Signed)
Pt. Needs to 12 wk botox apt.

## 2017-05-26 NOTE — Progress Notes (Signed)
Botox- 100 units x 2 vials Lot: [id number] Expiration: 11/2019 NDC: 3142-7670-11  Bacteriostatic 0.9% Sodium Chloride- 46mL total Lot: [id number] Expiration: 09/02/2018 NDC: 1164-3539-12  Dx: Q58.346 B/B //BCrn

## 2017-05-27 ENCOUNTER — Ambulatory Visit: Payer: PPO | Attending: Neurology | Admitting: Physical Therapy

## 2017-05-27 ENCOUNTER — Encounter: Payer: Self-pay | Admitting: Physical Therapy

## 2017-05-27 DIAGNOSIS — R252 Cramp and spasm: Secondary | ICD-10-CM | POA: Insufficient documentation

## 2017-05-27 DIAGNOSIS — R293 Abnormal posture: Secondary | ICD-10-CM | POA: Diagnosis not present

## 2017-05-27 DIAGNOSIS — M542 Cervicalgia: Secondary | ICD-10-CM

## 2017-05-27 MED ORDER — ONDANSETRON HCL 4 MG PO TABS: 4.0000 mg | ORAL_TABLET | Freq: Three times a day (TID) | ORAL | 6 refills | Status: DC | PRN

## 2017-05-27 NOTE — Therapy (Addendum)
[place], [place], [address] Phone: [phone]   Fax:  [phone]  Physical Therapy Evaluation and Addended Discharge  Patient Details  Name: [name] MRN: [id number] Date of Birth: 07-Jun-1950 Referring Provider: Dr Sarina Ill   Encounter Date: 05/27/2017  PT End of Session - 05/27/17 1412    Visit Number  1    Number of Visits  12    Date for PT Re-Evaluation  07/08/17    Authorization Type  [employer]    PT Start Time  1334    PT Stop Time  1410    PT Time Calculation (min)  36 min    Activity Tolerance  Patient tolerated treatment well    Behavior During Therapy  [facility] for tasks assessed/performed       Past Medical History:  Diagnosis Date  . 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 Asthma   . COLONIC POLYPS, HX OF 11/15/2009  . DEPRESSION 11/14/2009  . GASTROJEJ ULCR UNS ACUT/CHRN W/O HEMOR PERF/OBST 11/06/2009  . GERD (gastroesophageal reflux disease)   . Headache(784.0) 11/15/2009  . Heart murmur   . HEART MURMUR, HX OF 11/15/2009  . HIATAL HERNIA 10/08/2009  . HIP FRACTURE, LEFT 11/14/2009  . HYPERLIPIDEMIA 11/14/2009  . HYPERTENSION 11/14/2009  . OSTEOPOROSIS 11/14/2009  . WEIGHT GAIN 01/10/2010    Past Surgical History:  Procedure Laterality Date  . APPENDECTOMY    . AUGMENTATION MAMMAPLASTY Bilateral   . BACK SURGERY    . Bleeding ulcers  10/2009  . Broken Hip  06/2001  . CATARACT EXTRACTION Bilateral 01/2017  . COLONOSCOPY N/A 12/30/2016   Procedure: COLONOSCOPY WITH PROPOFOL;  Surgeon: Leighton Ruff, MD;  Location: [facility];  Service: Endoscopy;  Laterality: N/A;  . NECK SURGERY  04 & 06   x's 2  . RECTAL PROLAPSE REPAIR  01/2017    There were no vitals filed for this visit.   Subjective Assessment - 05/27/17 1336    Subjective  Pt is a 67 y/o female who presents to OPPT with increasing migraines.  Pt with hx of migraines x 2 years, with exacerbation following  retirement in Oct 2017.  Pt c/o Lt sided pain into jaw and neck, as well as nausea with migraines.  Pt reports constant headache and "there's never I time where I don't have a migraine."    Pertinent History  migraines    Patient Stated Goals  stop headaches and nausea    Currently in Pain?  Yes    Pain Score  1  up to 6-7/10    Pain Location  Head and neck    Pain Orientation  Left    Pain Descriptors / Indicators  Headache;Sharp;Shooting;Throbbing;Dull    Pain Type  Chronic pain    Pain Onset  More than a month ago    Pain Frequency  Intermittent    Aggravating Factors   unknown triggers    Pain Relieving Factors  medication, dark room, lying down         [facility] PT Assessment - 05/27/17 1341      Assessment   Medical Diagnosis  migraines, neck pain    Referring Provider  Dr Sarina Ill    Onset Date/Surgical Date  -- Oct 2017    Hand Dominance  Right    Next MD Visit  3 months    Prior Therapy  none recently      Precautions   Precautions  None  Restrictions   Weight Bearing Restrictions  No      Balance Screen   Has the patient fallen in the past 6 months  No    Has the patient had a decrease in activity level because of a fear of falling?   No    Is the patient reluctant to leave their home because of a fear of falling?   No      Home Film/video editor residence    Living Arrangements  Spouse/significant other    Type of Home  Apartment condo    Additional Comments  2 story condo; no difficulty with stairs or ADLs      Prior Function   Level of Independence  Independent    Vocation  Retired    [employer]  retired Producer, television/film/video    Leisure  reading, sewing      Cognition   Overall Cognitive Status  Within [place] for tasks assessed      Observation/Other Assessments   Focus on Therapeutic Outcomes ([place])   wrong body part set up      Posture/Postural Control   Posture/Postural Control  Postural  limitations    Postural Limitations  Rounded Shoulders;Forward head;Increased thoracic kyphosis      ROM / Strength   AROM / PROM / Strength  AROM;Strength      AROM   AROM Assessment Site  Cervical    Cervical Flexion  48    Cervical Extension  35    Cervical - Right Side Bend  27    Cervical - Left Side Bend  20    Cervical - Right Rotation  57    Cervical - Left Rotation  70      Strength   Strength Assessment Site  Shoulder    Right/Left Shoulder  Right;Left    Right Shoulder Flexion  4/5    Right Shoulder ABduction  3+/5    Right Shoulder Internal Rotation  5/5    Right Shoulder External Rotation  4/5    Left Shoulder Flexion  4/5    Left Shoulder ABduction  3+/5    Left Shoulder Internal Rotation  5/5    Left Shoulder External Rotation  4/5      Palpation   Palpation comment  trigger points noted in bil levator and upper trap (Lt>Rt), and SCM      Special Tests    Special Tests  Cervical    Cervical Tests  Dictraction      Distraction Test   Findngs  Negative             Objective measurements completed on examination: See above findings.      [place] [facility] [facility] PT Treatment/Exercise - 05/27/17 1341      Self-Care   Self-Care  Other Self-Care Comments    Other Self-Care Comments   provided HEP: pt performed 2-3 reps of each exercise; discussed posture and effects on pain and recommended lap support for reading to decrease forward head motion             PT Education - 05/27/17 1412    Education provided  Yes    Education Details  see self care    Person(s) Educated  Patient    Methods  Explanation;Handout;Demonstration    Comprehension  Verbalized understanding;Returned demonstration          PT Long Term Goals - 05/27/17 1417      PT LONG TERM  GOAL #1   Title  independent with HEP    Status  New    Target Date  07/08/17      PT LONG TERM GOAL #2   Title  improve Rt cervical rotation and Lt cervical side bending by 10 degrees for  improved function and mobility    Status  New    Target Date  07/08/17      PT LONG TERM GOAL #3   Title  report pain < 4/10 at its worst for improved function and mobility    Status  New    Target Date  07/08/17      PT LONG TERM GOAL #4   Title  verbalize understanding of postural awareness to decrease pain and improve function    Status  New    Target Date  07/08/17             Plan - 05/27/17 1413    Clinical Impression Statement  Pt is a 67 y/o female who presents to OPPT with head and neck pain associated with migraines.  At this time migraines currently uncontrolled and feel this will have impact on progress.  Pt demonstrates active trigger points, abnormal posture, and decreased strength and ROM affecting functional mobility.  Pt will benefit from PT to address deficits listed.     History and Personal Factors relevant to plan of care:  migraines    Clinical Presentation  Evolving    Clinical Presentation due to:  uncontrolled migraines    Clinical Decision Making  Moderate    Rehab Potential  Good    PT Frequency  2x / week recommend 2x/wk; but may only see 1x/wk depending on pt's schedule    PT Duration  6 weeks    PT Treatment/Interventions  ADLs/Self Care Home Management;Cryotherapy;Electrical Stimulation;Moist Heat;Traction;Therapeutic exercise;Therapeutic activities;Ultrasound;Neuromuscular re-education;Patient/family education;Manual techniques;Taping;Dry needling;Passive range of motion    PT Next Visit Plan  review HEP, consider DN if pt has headache, manual/modalities and posture strengthening PRN    Consulted and Agree with Plan of Care  Patient       Patient will benefit from skilled therapeutic intervention in order to improve the following deficits and impairments:  Pain, Impaired UE functional use, Increased fascial restricitons, Increased muscle spasms, Postural dysfunction, Decreased range of motion, Decreased strength  Visit Diagnosis: Cervicalgia -  Plan: PT plan of care cert/re-cert  Cramp and spasm - Plan: PT plan of care cert/re-cert  Abnormal posture - Plan: PT plan of care cert/re-cert     Problem List Patient Active Problem List   Diagnosis Date Noted  . Osteoarthritis 05/25/2017  . Rectal prolapse 12/31/2016  . Generalized abdominal pain 12/02/2016  . Medicare annual wellness visit, subsequent 12/02/2016  . Chronic pain 05/26/2016  . Chronic migraine without aura without status migrainosus, not intractable 01/12/2016  . Allergic reaction 10/22/2015  . Rectal discomfort 12/01/2012  . Hemorrhoids 12/01/2012  . COLONIC POLYPS, HX OF 11/15/2009  . Hyperlipidemia 11/14/2009  . 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 Anxiety and depression 11/14/2009  . Essential hypertension 11/14/2009  . Osteopenia 11/14/2009  . Gastrojejunal ulcer 11/06/2009  . HIATAL HERNIA 10/08/2009      Laureen Abrahams, PT, DPT 05/27/17 2:21 PM    [facility] [address] [place], [place], [address] Phone: [phone]   Fax:  [phone]  Name: [name] MRN: [id number] Date of Birth: 1950-10-16   PATIENT DID NOT RETURN FOR PT TREATMENT   Raeford Razor,  PT 09/14/17 3:51 PM Phone: [phone] Fax: [phone]

## 2017-05-27 NOTE — Patient Instructions (Signed)
Supine Push    Take a deep breath and exhale while pushing the back of neck down on the bed. Hold __5__ seconds. Repeat __10__ times. Do __1-2__ sessions per day.   Scapular Retraction (Standing)    With arms at sides, pinch shoulder blades together.  Hold for 5 seconds.  Repeat __10__ times per set. Do __1__ sets per session. Do __1-2__ sessions per day.  Healthy Back - Shoulder Roll    Stand straight with arms relaxed at sides. Roll shoulders backward continuously. Do __5-15__ times, several sessions a day.

## 2017-05-28 ENCOUNTER — Other Ambulatory Visit: Payer: Self-pay | Admitting: Primary Care

## 2017-05-28 DIAGNOSIS — G4700 Insomnia, unspecified: Secondary | ICD-10-CM

## 2017-05-31 NOTE — Progress Notes (Signed)
Interval history: >50% decrease in migraine frequency and severity since starting botox.   Consent Form Botulism Toxin Injection For Chronic Migraine  Botulism toxin has been approved by the Federal drug administration for treatment of chronic migraine. Botulism toxin does not cure chronic migraine and it may not be effective in some patients.  The administration of botulism toxin is accomplished by injecting a small amount of toxin into the muscles of the neck and head. Dosage must be titrated for each individual. Any benefits resulting from botulism toxin tend to wear off after 3 months with a repeat injection required if benefit is to be maintained. Injections are usually done every 3-4 months with maximum effect peak achieved by about 2 or 3 weeks. Botulism toxin is expensive and you should be sure of what costs you will incur resulting from the injection.  The side effects of botulism toxin use for chronic migraine may include:   -Transient, and usually mild, facial weakness with facial injections  -Transient, and usually mild, head or neck weakness with head/neck injections  -Reduction or loss of forehead facial animation due to forehead muscle              weakness  -Eyelid drooping  -Dry eye  -Pain at the site of injection or bruising at the site of injection  -Double vision  -Potential unknown long term risks  Contraindications: You should not have Botox if you are pregnant, nursing, allergic to albumin, have an infection, skin condition, or muscle weakness at the site of the injection, or have myasthenia gravis, Lambert-Eaton syndrome, or ALS.  It is also possible that as with any injection, there may be an allergic reaction or no effect from the medication. Reduced effectiveness after repeated injections is sometimes seen and rarely infection at the injection site may occur. All care will be taken to prevent these side effects. If therapy is given over a long time, atrophy and  wasting in the muscle injected may occur. Occasionally the patient's become refractory to treatment because they develop antibodies to the toxin. In this event, therapy needs to be modified.  I have read the above information and consent to the administration of botulism toxin.    ______________  _____   _________________  Patient signature     Date   Witness signature       BOTOX PROCEDURE NOTE FOR MIGRAINE HEADACHE    Contraindications and precautions discussed with patient(above). Aseptic procedure was observed and patient tolerated procedure. Procedure performed by Dr. Georgia Dom  The condition has existed for more than 6 months, and pt does not have a diagnosis of ALS, Myasthenia Gravis or Lambert-Eaton Syndrome. Risks and benefits of injections discussed and pt agrees to proceed with the procedure. Written consent obtained  These injections are medically necessary. He receives good benefits from these injections. These injections do not cause sedations or hallucinations which the oral therapies may cause.  Indication/Diagnosis: chronic migraine BOTOX(J0585) injection was performed according to protocol by [employer]. 200 units of BOTOX was dissolved into 4 cc NS.  NDC: [address]-06  Type of toxin: Botox   Description of procedure:  The patient was placed in a sitting position. The standard protocol was used for Botox as follows, with 5 units of Botox injected at each site:   -Procerus muscle, midline injection  -Corrugator muscle, bilateral injection  -Frontalis muscle, bilateral injection, with 2 sites each side, medial injection was performed in the upper one third of the frontalis muscle,  in the region vertical from the medial inferior edge of the superior orbital rim. The lateral injection was again in the upper one third of the forehead vertically above the lateral limbus of the cornea, 1.5 cm lateral to the medial injection site.  -Temporalis muscle injection, 4  sites, bilaterally. The first injection was 3 cm above the tragus of the ear, second injection site was 1.5 cm to 3 cm up from the first injection site in line with the tragus of the ear. The third injection site was 1.5-3 cm forward between the first 2 injection sites. The fourth injection site was 1.5 cm posterior to the second injection site.  -Occipitalis muscle injection, 3 sites, bilaterally. The first injection was done one half way between the occipital protuberance and the tip of the mastoid process behind the ear. The second injection site was done lateral and superior to the first, 1 fingerbreadth from the first injection. The third injection site was 1 fingerbreadth superiorly and medially from the first injection site.  -Cervical paraspinal muscle injection, 2 sites, bilateral knee first injection site was 1 cm from the midline of the cervical spine, 3 cm inferior to the lower border of the occipital protuberance. The second injection site was 1.5 cm superiorly and laterally to the first injection site.  -Trapezius muscle injection was performed at 3 sites, bilaterally. The first injection site was in the upper trapezius muscle halfway between the inflection point of the neck, and the acromion. The second injection site was one half way between the acromion and the first injection site. The third injection was done between the first injection site and the inflection point of the neck.   Will return for repeat injection in 3 months.   A 200 unit sof Botox was used, 155 units were injected, the rest of the Botox was wasted. The patient tolerated the procedure well, there were no complications of the above procedure.

## 2017-06-01 NOTE — Telephone Encounter (Signed)
I called to schedule the patient twice but she did not answer and there was not an option for VM.

## 2017-06-12 DIAGNOSIS — J0100 Acute maxillary sinusitis, unspecified: Secondary | ICD-10-CM | POA: Diagnosis not present

## 2017-06-12 DIAGNOSIS — R062 Wheezing: Secondary | ICD-10-CM | POA: Diagnosis not present

## 2017-06-12 DIAGNOSIS — J209 Acute bronchitis, unspecified: Secondary | ICD-10-CM | POA: Diagnosis not present

## 2017-06-12 DIAGNOSIS — R0982 Postnasal drip: Secondary | ICD-10-CM | POA: Diagnosis not present

## 2017-06-12 DIAGNOSIS — R05 Cough: Secondary | ICD-10-CM | POA: Diagnosis not present

## 2017-06-19 DIAGNOSIS — D72829 Elevated white blood cell count, unspecified: Secondary | ICD-10-CM | POA: Diagnosis not present

## 2017-06-19 DIAGNOSIS — R05 Cough: Secondary | ICD-10-CM | POA: Diagnosis not present

## 2017-06-19 DIAGNOSIS — J40 Bronchitis, not specified as acute or chronic: Secondary | ICD-10-CM | POA: Diagnosis not present

## 2017-06-19 DIAGNOSIS — I10 Essential (primary) hypertension: Secondary | ICD-10-CM | POA: Diagnosis not present

## 2017-06-19 DIAGNOSIS — J181 Lobar pneumonia, unspecified organism: Secondary | ICD-10-CM | POA: Diagnosis not present

## 2017-06-19 DIAGNOSIS — R0602 Shortness of breath: Secondary | ICD-10-CM | POA: Diagnosis not present

## 2017-06-19 DIAGNOSIS — F419 Anxiety disorder, unspecified: Secondary | ICD-10-CM | POA: Diagnosis not present

## 2017-06-22 ENCOUNTER — Encounter: Payer: PPO | Admitting: Physical Therapy

## 2017-06-23 ENCOUNTER — Other Ambulatory Visit: Payer: Self-pay | Admitting: Primary Care

## 2017-06-23 DIAGNOSIS — F32A Depression, unspecified: Secondary | ICD-10-CM

## 2017-06-23 DIAGNOSIS — F419 Anxiety disorder, unspecified: Principal | ICD-10-CM

## 2017-06-23 DIAGNOSIS — F329 Major depressive disorder, single episode, unspecified: Secondary | ICD-10-CM

## 2017-06-23 MED ORDER — CLONAZEPAM 0.5 MG PO TABS: 0.5000 mg | ORAL_TABLET | Freq: Every day | ORAL | 0 refills | Status: DC | PRN

## 2017-06-23 NOTE — Telephone Encounter (Signed)
Clonazepam (Klonopin)refill Last OV: 12/02/2016 with Pincus Sanes Last Refill:01/12/17 [facility] [place] [address]

## 2017-06-23 NOTE — Telephone Encounter (Signed)
Copied from [place]. Topic: Quick Communication - Rx Refill/Question >> Jun 23, 2017 10:50 AM Marin Olp L wrote: Medication: clonazePAM (KLONOPIN) 0.5 MG tablet   Has the patient contacted their pharmacy? Yes.    (Agent: If no, request that the patient contact the pharmacy for the refill.)  Preferred Pharmacy (with phone number or street name): [facility] [place] [place] [facility] [place] [address] [place] [address] Phone: [phone] Fax: [phone]  Agent: Please be advised that RX refills may take up to 3 business days. We ask that you follow-up with your pharmacy.  Out of medication, 0 refills.

## 2017-06-23 NOTE — Telephone Encounter (Signed)
Refill sent to pharmacy.   

## 2017-06-24 ENCOUNTER — Encounter: Payer: Self-pay | Admitting: Primary Care

## 2017-06-24 ENCOUNTER — Ambulatory Visit (INDEPENDENT_AMBULATORY_CARE_PROVIDER_SITE_OTHER): Payer: PPO | Admitting: Primary Care

## 2017-06-24 VITALS — BP 112/70 | HR 97 | Temp 98.1°F | Ht 61.0 in | Wt 135.0 lb

## 2017-06-24 DIAGNOSIS — J189 Pneumonia, unspecified organism: Secondary | ICD-10-CM | POA: Diagnosis not present

## 2017-06-24 DIAGNOSIS — F419 Anxiety disorder, unspecified: Secondary | ICD-10-CM

## 2017-06-24 DIAGNOSIS — J4520 Mild intermittent asthma, uncomplicated: Secondary | ICD-10-CM | POA: Diagnosis not present

## 2017-06-24 DIAGNOSIS — F329 Major depressive disorder, single episode, unspecified: Secondary | ICD-10-CM | POA: Diagnosis not present

## 2017-06-24 DIAGNOSIS — J45909 Unspecified asthma, uncomplicated: Secondary | ICD-10-CM | POA: Insufficient documentation

## 2017-06-24 MED ORDER — BENZONATATE 200 MG PO CAPS: 200.0000 mg | ORAL_CAPSULE | Freq: Three times a day (TID) | ORAL | 0 refills | Status: DC | PRN

## 2017-06-24 MED ORDER — BUSPIRONE HCL 7.5 MG PO TABS: 7.5000 mg | ORAL_TABLET | Freq: Two times a day (BID) | ORAL | 1 refills | Status: DC

## 2017-06-24 NOTE — Patient Instructions (Addendum)
Start buspirone 7.5 mg tablets for anxiety. Take 1 tablet by mouth twice daily, everyday.  Continue Cymbalta at the 90 mg dose.  Use the clonazepam very sparingly as discussed.   Reconnect with your therapist, have him give me a call if he has any questions.   You may take Benzonatate capsules for cough. Take 1 capsule by mouth three times daily as needed for cough.  Please notify me if you continue to use the albuterol recurrently after you're feeling better.  It was a pleasure to see you today!

## 2017-06-24 NOTE — Assessment & Plan Note (Signed)
Diagnosed in childhood, no problems during adulthood until just recently. Discussed to notify if she continues to require recurrent use of her albuterol. Consider daily controller ICS if needed. She'll update. Mild wheezing throughout today, this could very well be secondary to recent pneumonia.

## 2017-06-24 NOTE — Progress Notes (Signed)
Subjective:    Patient ID: [name], female    DOB: 04-25-1951, 67 y.o.   MRN: [id number]  HPI  [name] is a 67 year old female who presents today for Urgent Care follow up.   1) CAP: She presented to Urgent Care on 06/11/17 with a two-three week history of cough/cold symptoms. She was diagnosed with acute bronchitis and was treated with an albuterol inhaler, prednisone, and Augmentin course. Several days later she started feeling worse so she presented back to the Urgent Care and was placed on Azithromycin. She then went to the hospital where a chest xray was preformed and diagnosed with pneumonia.   She's continuing to cough up sputum from her chest. She's using the albuterol inhaler every several hours for wheezing and shortness of breath. Her appetite is slowly returning. She completed her antibiotics this morning, completed her prednisone as prescribed. The cough is her most bothersome symptom.    2) GAD: She's very happy with her life, is retired, is happy with her marriage but continues to experience symptoms of anxiety including feeling anxious, clenching her teeth, and worrying. She's managed on Cymbalta and is taking 90 mg which was recently increased. She was once managed on Wellbutrin in the past which caused an increase in her anxiety, also tried Buspar but not sure why she stopped. GAD 7 score of 10 today. She has a therapist for whom she's seen in the past, has not recently reconnected. She has recently been taking her clonazepam more frequently as she was told that she needed something to control anxiety.  3) Asthma: Diagnosed in childhood, no problems during adulthood until recent pneumonia/bronchitis diagnosis. She's using her albuterol inhaler every 2-4 hours for wheezing and shortness of breath. She's never been on a controller inhaler in the past.  Review of Systems  Constitutional: Negative for fever.  Respiratory: Positive for cough, shortness of breath and  wheezing.   Psychiatric/Behavioral: The patient is nervous/anxious.        Past Medical History:  Diagnosis Date  . 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 Asthma   . COLONIC POLYPS, HX OF 11/15/2009  . DEPRESSION 11/14/2009  . GASTROJEJ ULCR UNS ACUT/CHRN W/O HEMOR PERF/OBST 11/06/2009  . GERD (gastroesophageal reflux disease)   . Headache(784.0) 11/15/2009  . Heart murmur   . HEART MURMUR, HX OF 11/15/2009  . HIATAL HERNIA 10/08/2009  . HIP FRACTURE, LEFT 11/14/2009  . HYPERLIPIDEMIA 11/14/2009  . HYPERTENSION 11/14/2009  . OSTEOPOROSIS 11/14/2009  . WEIGHT GAIN 01/10/2010     Social History   Socioeconomic History  . Marital status: Married    Spouse name: [name]  . Number of children: 3  . Years of education: BA  . Highest education level: Not on file  Social Needs  . Financial resource strain: Not on file  . Food insecurity - worry: Not on file  . Food insecurity - inability: Not on file  . Transportation needs - medical: Not on file  . Transportation needs - non-medical: Not on file  Occupational History  . Occupation: IT consultant and Spa  Tobacco Use  . Smoking status: Former Smoker    Last attempt to quit: 05/04/1982    Years since quitting: 35.1  . Smokeless tobacco: Never Used  . 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 Alcohol use: Yes    Comment: occ wine   .  Drug use: No  . Sexual activity: Not on file  Other Topics Concern  . Not on file  Social History Narrative   Lives with husband   Caffeine use: 1 cup or less    Right handed    Past Surgical History:  Procedure Laterality Date  . APPENDECTOMY    . AUGMENTATION MAMMAPLASTY Bilateral   . BACK SURGERY    . Bleeding ulcers  10/2009  . Broken Hip  06/2001  . CATARACT EXTRACTION Bilateral 01/2017  . COLONOSCOPY N/A 12/30/2016   Procedure: COLONOSCOPY WITH PROPOFOL;   Surgeon: Leighton Ruff, MD;  Location: [facility];  Service: Endoscopy;  Laterality: N/A;  . NECK SURGERY  04 & 06   x's 2  . RECTAL PROLAPSE REPAIR  01/2017    Family History  Problem Relation Age of Onset  . Bone cancer Mother   . Hypertension Other   . Allergic rhinitis Neg Hx   . Asthma Neg Hx   . Urticaria Neg Hx     No Known Allergies  Current Outpatient Medications on File Prior to Visit  Medication Sig Dispense Refill  . DULoxetine (CYMBALTA) 30 MG capsule Take 90 mg by mouth daily.    Marland Kitchen CALCIUM PO Take 1 tablet by mouth daily.     . clonazePAM (KLONOPIN) 0.5 MG tablet Take 1 tablet (0.5 mg total) by mouth daily as needed for anxiety. 90 tablet 0  . cycloSPORINE (RESTASIS) 0.05 % ophthalmic emulsion Place 1 drop into both eyes 2 (two) times daily.    . DULoxetine (CYMBALTA) 60 MG capsule Take 60 mg by mouth daily.     Eduard Roux (AIMOVIG) 70 MG/ML SOAJ Inject 1 pen every 30 (thirty) days into the skin. 1 pen 11  . famotidine (PEPCID AC) 10 MG tablet Take 1 tablet (10 mg total) by mouth 2 (two) times daily. 60 tablet 3  . ibandronate (BONIVA) 150 MG tablet Take 1 tablet (150 mg total) by mouth every 30 (thirty) days. 3 tablet 3  . lisinopril-hydrochlorothiazide (PRINZIDE,ZESTORETIC) 20-12.5 MG tablet TAKE 1 TABLET BY MOUTH DAILY. 90 tablet 2  . magnesium gluconate (MAGONATE) 500 MG tablet Take 500 mg by mouth daily.    . methylPREDNISolone (MEDROL DOSEPAK) 4 MG TBPK tablet follow package directions 21 tablet 1  . Multiple Vitamins-Minerals (MULTIVITAMIN PO) Take 1 tablet by mouth daily.     . ondansetron (ZOFRAN) 4 MG tablet Take 1 tablet (4 mg total) by mouth every 8 (eight) hours as needed for nausea or vomiting. 90 tablet 6  . propranolol ER (INDERAL LA) 60 MG 24 hr capsule Take 1 capsule (60 mg total) by mouth daily. 30 capsule 11  . SUMAtriptan (IMITREX) 100 MG tablet Take 1 tablet (100 mg total) by mouth once. May repeat in 2 hours.Max 2 tabs in one day. Max  10tabs  in one month. 10 tablet 12  . traZODone (DESYREL) 50 MG tablet TAKE 1-2 TABLETS (50-100 MG TOTAL) BY MOUTH AT BEDTIME AS NEEDED FOR SLEEP. 180 tablet 1   No current facility-administered medications on file prior to visit.     BP 112/70   Pulse 97   Temp 98.1 F (36.7 C) (Oral)   Ht 5\' 1"  (1.549 m)   Wt 135 lb (61.2 kg)   SpO2 98%   BMI 25.51 kg/m    Objective:   Physical Exam  Constitutional: She appears well-nourished.  HENT:  Right Ear: Tympanic membrane and ear canal normal.  Left Ear: Tympanic membrane and  ear canal normal.  Nose: Right sinus exhibits no maxillary sinus tenderness and no frontal sinus tenderness. Left sinus exhibits no maxillary sinus tenderness and no frontal sinus tenderness.  Mouth/Throat: Oropharynx is clear and moist.  Eyes: Conjunctivae are normal.  Neck: Neck supple.  Cardiovascular: Normal rate and regular rhythm.  Pulmonary/Chest: Effort normal. She has no decreased breath sounds. She has wheezes in the right upper field, the right lower field, the left upper field and the left lower field. She has no rhonchi. She has no rales.  Mild wheezes throughout  Lymphadenopathy:    She has no cervical adenopathy.  Skin: Skin is warm and dry.  Psychiatric: She has a normal mood and affect.          Assessment & Plan:  CAP:  Diagnosed one week ago, completed all antibiotics. Overall feeling slightly better, better appetite. Exam today overall stable, normal vitals.  Continue albuterol PRN, precautions provdied.  Rx for tessalon perles sent to pharmacy to use PRN. Fluids, rest, follow up PRN.  Pleas Koch, NP

## 2017-06-24 NOTE — Assessment & Plan Note (Signed)
Using clonazepam PRN for the most part until just recently. GAD 7 score of 10 on Cymbalta 90 mg. Long discussion again regarding the inappropriateness of benzos for daily anxiety symptoms. Do not support daily benzo use.   Will have her reconnect with her therapist. Rx for Buspar 7.5 mg tablets sent to pharmacy for daily anxiety while she works with her therapist. Discussed to use clonazepam only as needed. She will update.

## 2017-06-29 ENCOUNTER — Encounter: Payer: PPO | Admitting: Physical Therapy

## 2017-07-03 ENCOUNTER — Encounter: Payer: Self-pay | Admitting: Primary Care

## 2017-07-03 DIAGNOSIS — J45998 Other asthma: Secondary | ICD-10-CM | POA: Diagnosis not present

## 2017-07-03 DIAGNOSIS — R05 Cough: Secondary | ICD-10-CM | POA: Diagnosis not present

## 2017-07-03 DIAGNOSIS — J4520 Mild intermittent asthma, uncomplicated: Secondary | ICD-10-CM

## 2017-07-05 MED ORDER — BUDESONIDE 180 MCG/ACT IN AEPB: 2.0000 | INHALATION_SPRAY | Freq: Two times a day (BID) | RESPIRATORY_TRACT | 5 refills | Status: DC

## 2017-07-06 ENCOUNTER — Telehealth: Payer: Self-pay | Admitting: Primary Care

## 2017-07-06 DIAGNOSIS — J4520 Mild intermittent asthma, uncomplicated: Secondary | ICD-10-CM

## 2017-07-06 MED ORDER — MOMETASONE FUROATE 220 MCG/INH IN AEPB: 1.0000 | INHALATION_SPRAY | Freq: Every evening | RESPIRATORY_TRACT | 2 refills | Status: DC

## 2017-07-06 NOTE — Telephone Encounter (Signed)
Patient reports that Symbicort was not covered by patient's insurance and that [facility] [address] [facility] other recommends for which would be covered. [facility] [address] and ask for the alternative options, patient needs the inhaler soon.

## 2017-07-06 NOTE — Telephone Encounter (Signed)
Spoken and notified patient of Kate's comments. Patient verbalized understanding. 

## 2017-07-06 NOTE — Telephone Encounter (Signed)
Please call patient and notify her that I sent in asmanex inhaler to her pharmacy. Inhale 1 puff into the lungs every evening. Use the albuterol as needed. Please have her update me in 1 week.

## 2017-07-06 NOTE — Telephone Encounter (Signed)
[facility] told me an alternative option is Amanex.

## 2017-07-06 NOTE — Telephone Encounter (Signed)
Copied from [place] [id number]. Topic: Quick Communication - See Telephone Encounter >> Jul 06, 2017  1:37 PM Burnis Medin, NT wrote: CRM for notification. See Telephone encounter for:  Johann Capers called and said they received a prescription for budesonide (PULMICORT FLEXHALER) 180 MCG/ACT inhaler and is not cover by insurance. Pharmacy wanted to see if something else can be sent it in place of that. Also patient requested medication albuterol 0.08/ 3%. [facility] [place] [facility] [place] [address] [phone] (Phone) [phone] (Fax)    07/06/17.

## 2017-07-07 NOTE — Telephone Encounter (Signed)
See request. LOV 06/24/17.  Thanks.

## 2017-07-07 NOTE — Telephone Encounter (Signed)
Sent in Rx for Asmanex on 07/06/17.

## 2017-07-08 MED ORDER — ALBUTEROL SULFATE (2.5 MG/3ML) 0.083% IN NEBU: 2.5000 mg | INHALATION_SOLUTION | Freq: Four times a day (QID) | RESPIRATORY_TRACT | 0 refills | Status: DC | PRN

## 2017-07-08 NOTE — Addendum Note (Signed)
Addended by: Pleas Koch on: 07/08/2017 03:08 PM   Modules accepted: Orders

## 2017-07-08 NOTE — Telephone Encounter (Signed)
Noted, refill sent to pharmacy. 

## 2017-07-08 NOTE — Telephone Encounter (Signed)
Pt left note at front desk that on 07/06/17 pt requested refill albuterol 0.08/3% for nebulizer. Pt said she got the albuterol at an UC visit. Is not on current or hx med list. [facility]

## 2017-07-09 ENCOUNTER — Encounter: Payer: Self-pay | Admitting: Primary Care

## 2017-07-27 DIAGNOSIS — M47816 Spondylosis without myelopathy or radiculopathy, lumbar region: Secondary | ICD-10-CM | POA: Diagnosis not present

## 2017-07-27 DIAGNOSIS — M47812 Spondylosis without myelopathy or radiculopathy, cervical region: Secondary | ICD-10-CM | POA: Diagnosis not present

## 2017-08-02 ENCOUNTER — Telehealth: Payer: Self-pay | Admitting: Neurology

## 2017-08-02 NOTE — Telephone Encounter (Signed)
Pt is ready to schedule her next Botox, pt states she willl take any date but she would prefer it be after lunch and if the 23rd of April is available it would really be great for her.  Please  call

## 2017-08-03 DIAGNOSIS — J45998 Other asthma: Secondary | ICD-10-CM | POA: Diagnosis not present

## 2017-08-03 NOTE — Telephone Encounter (Signed)
I called to schedule the patient but she did not answer so I left a VM asking her to call me back.  

## 2017-08-03 NOTE — Telephone Encounter (Signed)
Pt returning call

## 2017-08-04 NOTE — Telephone Encounter (Signed)
Pt called wanting to schedule her botox

## 2017-08-04 NOTE — Telephone Encounter (Signed)
I called and scheduled the patient for her next injection.  °

## 2017-08-24 ENCOUNTER — Other Ambulatory Visit: Payer: Self-pay | Admitting: Primary Care

## 2017-08-24 DIAGNOSIS — G4700 Insomnia, unspecified: Secondary | ICD-10-CM

## 2017-08-27 ENCOUNTER — Encounter: Payer: Self-pay | Admitting: Primary Care

## 2017-08-27 ENCOUNTER — Other Ambulatory Visit: Payer: Self-pay | Admitting: Primary Care

## 2017-08-27 ENCOUNTER — Encounter (INDEPENDENT_AMBULATORY_CARE_PROVIDER_SITE_OTHER): Payer: Self-pay

## 2017-08-27 DIAGNOSIS — F329 Major depressive disorder, single episode, unspecified: Secondary | ICD-10-CM

## 2017-08-27 DIAGNOSIS — F419 Anxiety disorder, unspecified: Principal | ICD-10-CM

## 2017-08-27 DIAGNOSIS — F32A Depression, unspecified: Secondary | ICD-10-CM

## 2017-08-27 MED ORDER — CLONAZEPAM 0.5 MG PO TABS: ORAL_TABLET | ORAL | 0 refills | Status: DC

## 2017-08-27 NOTE — Telephone Encounter (Signed)
Will send patient My Chart message for an update on Clonazepam use. The goal is infrequent use so she shouldn't be out.

## 2017-08-27 NOTE — Telephone Encounter (Signed)
Ok to refill? Electronically refill request for clonazePAM (KLONOPIN) 0.5 MG tablet  Last prescribed on 06/23/2017. Last seen on 06/24/2017

## 2017-08-30 ENCOUNTER — Other Ambulatory Visit: Payer: Self-pay | Admitting: Primary Care

## 2017-08-30 ENCOUNTER — Telehealth: Payer: Self-pay | Admitting: Nurse Practitioner

## 2017-08-30 ENCOUNTER — Encounter: Payer: Self-pay | Admitting: Nurse Practitioner

## 2017-08-30 DIAGNOSIS — J4520 Mild intermittent asthma, uncomplicated: Secondary | ICD-10-CM

## 2017-08-30 NOTE — Telephone Encounter (Signed)
Refill sent to pharmacy.   

## 2017-08-30 NOTE — Telephone Encounter (Signed)
See message below °

## 2017-08-30 NOTE — Telephone Encounter (Signed)
Ok to refill? Electronically refill request for [facility] 30 METERED DOSES 220 MCG/INH inhaler  Last prescribed on 07/06/2017. Last seen on 06/24/2017

## 2017-08-31 ENCOUNTER — Encounter: Payer: Self-pay | Admitting: Neurology

## 2017-08-31 ENCOUNTER — Telehealth: Payer: Self-pay | Admitting: Neurology

## 2017-08-31 ENCOUNTER — Ambulatory Visit (INDEPENDENT_AMBULATORY_CARE_PROVIDER_SITE_OTHER): Payer: PPO | Admitting: Neurology

## 2017-08-31 VITALS — BP 116/85 | HR 120

## 2017-08-31 DIAGNOSIS — G43709 Chronic migraine without aura, not intractable, without status migrainosus: Secondary | ICD-10-CM

## 2017-08-31 DIAGNOSIS — F419 Anxiety disorder, unspecified: Secondary | ICD-10-CM

## 2017-08-31 DIAGNOSIS — F329 Major depressive disorder, single episode, unspecified: Secondary | ICD-10-CM

## 2017-08-31 MED ORDER — CLONAZEPAM 0.5 MG PO TABS: ORAL_TABLET | ORAL | 4 refills | Status: DC

## 2017-08-31 NOTE — Progress Notes (Signed)
Botox- 100 units x 2 vials Lot: [id number] Expiration: 01/2020 NDC: 6773-7366-81  Bacteriostatic 0.9% Sodium Chloride- 64mL total Lot: [id number] Expiration: 09/02/2018 NDC: 6151-8343-73  Dx: H78.978 B/B //BCrn

## 2017-08-31 NOTE — Patient Instructions (Addendum)
Primary Care: Sadie HaberCaroleen Hamman Psychiatry: Eino Farber

## 2017-08-31 NOTE — Progress Notes (Signed)
Interval history: >50% decrease in migraine frequency and severity since starting botox.   Consent Form Botulism Toxin Injection For Chronic Migraine  Botulism toxin has been approved by the Federal drug administration for treatment of chronic migraine. Botulism toxin does not cure chronic migraine and it may not be effective in some patients.  The administration of botulism toxin is accomplished by injecting a small amount of toxin into the muscles of the neck and head. Dosage must be titrated for each individual. Any benefits resulting from botulism toxin tend to wear off after 3 months with a repeat injection required if benefit is to be maintained. Injections are usually done every 3-4 months with maximum effect peak achieved by about 2 or 3 weeks. Botulism toxin is expensive and you should be sure of what costs you will incur resulting from the injection.  The side effects of botulism toxin use for chronic migraine may include:   -Transient, and usually mild, facial weakness with facial injections  -Transient, and usually mild, head or neck weakness with head/neck injections  -Reduction or loss of forehead facial animation due to forehead muscle              weakness  -Eyelid drooping  -Dry eye  -Pain at the site of injection or bruising at the site of injection  -Double vision  -Potential unknown long term risks  Contraindications: You should not have Botox if you are pregnant, nursing, allergic to albumin, have an infection, skin condition, or muscle weakness at the site of the injection, or have myasthenia gravis, Lambert-Eaton syndrome, or ALS.  It is also possible that as with any injection, there may be an allergic reaction or no effect from the medication. Reduced effectiveness after repeated injections is sometimes seen and rarely infection at the injection site may occur. All care will be taken to prevent these side effects. If therapy is given over a long time, atrophy and  wasting in the muscle injected may occur. Occasionally the patient's become refractory to treatment because they develop antibodies to the toxin. In this event, therapy needs to be modified.  I have read the above information and consent to the administration of botulism toxin.    ______________  _____   _________________  Patient signature     Date   Witness signature       BOTOX PROCEDURE NOTE FOR MIGRAINE HEADACHE    Contraindications and precautions discussed with patient(above). Aseptic procedure was observed and patient tolerated procedure. Procedure performed by Dr. Georgia Dom  The condition has existed for more than 6 months, and pt does not have a diagnosis of ALS, Myasthenia Gravis or Lambert-Eaton Syndrome. Risks and benefits of injections discussed and pt agrees to proceed with the procedure. Written consent obtained  These injections are medically necessary. He receives good benefits from these injections. These injections do not cause sedations or hallucinations which the oral therapies may cause.  Indication/Diagnosis: chronic migraine BOTOX(J0585) injection was performed according to protocol by [employer]. 200 units of BOTOX was dissolved into 4 cc NS.  NDC: [address]-00  Type of toxin: Botox   Description of procedure:  The patient was placed in a sitting position. The standard protocol was used for Botox as follows, with 5 units of Botox injected at each site:   -Procerus muscle, midline injection  -Corrugator muscle, bilateral injection  -Frontalis muscle, bilateral injection, with 2 sites each side, medial injection was performed in the upper one third of the frontalis muscle,  in the region vertical from the medial inferior edge of the superior orbital rim. The lateral injection was again in the upper one third of the forehead vertically above the lateral limbus of the cornea, 1.5 cm lateral to the medial injection site.  -Temporalis muscle injection, 4  sites, bilaterally. The first injection was 3 cm above the tragus of the ear, second injection site was 1.5 cm to 3 cm up from the first injection site in line with the tragus of the ear. The third injection site was 1.5-3 cm forward between the first 2 injection sites. The fourth injection site was 1.5 cm posterior to the second injection site.  -Occipitalis muscle injection, 3 sites, bilaterally. The first injection was done one half way between the occipital protuberance and the tip of the mastoid process behind the ear. The second injection site was done lateral and superior to the first, 1 fingerbreadth from the first injection. The third injection site was 1 fingerbreadth superiorly and medially from the first injection site.  -Cervical paraspinal muscle injection, 2 sites, bilateral knee first injection site was 1 cm from the midline of the cervical spine, 3 cm inferior to the lower border of the occipital protuberance. The second injection site was 1.5 cm superiorly and laterally to the first injection site.  -Trapezius muscle injection was performed at 3 sites, bilaterally. The first injection site was in the upper trapezius muscle halfway between the inflection point of the neck, and the acromion. The second injection site was one half way between the acromion and the first injection site. The third injection was done between the first injection site and the inflection point of the neck.   Will return for repeat injection in 3 months.   A 200 unit sof Botox was used, 155 units were injected, the rest of the Botox was wasted. The patient tolerated the procedure well, there were no complications of the above procedure.

## 2017-08-31 NOTE — Telephone Encounter (Signed)
12 wk Botox inj °

## 2017-08-31 NOTE — Telephone Encounter (Signed)
Yes, it was [facility]. Their number is [phone].  Thanks, PG

## 2017-08-31 NOTE — Addendum Note (Signed)
Addended by: Sarina Ill B on: 08/31/2017 04:45 PM   Modules accepted: Orders

## 2017-08-31 NOTE — Addendum Note (Signed)
Addended by: Gildardo Griffes on: 08/31/2017 04:07 PM   Modules accepted: Orders

## 2017-09-01 ENCOUNTER — Other Ambulatory Visit: Payer: Self-pay | Admitting: Neurology

## 2017-09-01 ENCOUNTER — Other Ambulatory Visit: Payer: Self-pay | Admitting: *Deleted

## 2017-09-01 DIAGNOSIS — G43711 Chronic migraine without aura, intractable, with status migrainosus: Secondary | ICD-10-CM

## 2017-09-01 MED ORDER — ERENUMAB-AOOE 70 MG/ML ~~LOC~~ SOAJ: 70.0000 mg | SUBCUTANEOUS | 11 refills | Status: DC

## 2017-09-01 MED ORDER — ERENUMAB-AOOE 70 MG/ML ~~LOC~~ SOAJ: 1.0000 "pen " | SUBCUTANEOUS | 0 refills | Status: DC

## 2017-09-01 NOTE — Progress Notes (Signed)
Pt came by office today 09/01/2017 and picked up the 2 Aimvoig syringes.

## 2017-09-02 DIAGNOSIS — J45998 Other asthma: Secondary | ICD-10-CM | POA: Diagnosis not present

## 2017-09-07 NOTE — Telephone Encounter (Signed)
I called and scheduled the patient for her next injection.  °

## 2017-09-07 NOTE — Telephone Encounter (Signed)
Patient also sent an email which Nevin Bloodgood already responded.

## 2017-09-08 ENCOUNTER — Other Ambulatory Visit: Payer: Self-pay | Admitting: Primary Care

## 2017-09-08 DIAGNOSIS — F419 Anxiety disorder, unspecified: Principal | ICD-10-CM

## 2017-09-08 DIAGNOSIS — G894 Chronic pain syndrome: Secondary | ICD-10-CM

## 2017-09-08 DIAGNOSIS — F329 Major depressive disorder, single episode, unspecified: Secondary | ICD-10-CM

## 2017-09-08 NOTE — Telephone Encounter (Signed)
Ok to refill? Electronically refill request for DULoxetine (CYMBALTA) 30 MG capsule.  Have not been prescribed by Allie Bossier. Last seen on 06/24/2017

## 2017-09-13 ENCOUNTER — Other Ambulatory Visit: Payer: Self-pay | Admitting: Neurology

## 2017-09-13 DIAGNOSIS — G43709 Chronic migraine without aura, not intractable, without status migrainosus: Secondary | ICD-10-CM

## 2017-09-15 ENCOUNTER — Other Ambulatory Visit: Payer: Self-pay | Admitting: Primary Care

## 2017-09-15 DIAGNOSIS — F419 Anxiety disorder, unspecified: Principal | ICD-10-CM

## 2017-09-15 DIAGNOSIS — F329 Major depressive disorder, single episode, unspecified: Secondary | ICD-10-CM

## 2017-09-28 DIAGNOSIS — F331 Major depressive disorder, recurrent, moderate: Secondary | ICD-10-CM | POA: Diagnosis not present

## 2017-09-28 DIAGNOSIS — F411 Generalized anxiety disorder: Secondary | ICD-10-CM | POA: Diagnosis not present

## 2017-09-30 ENCOUNTER — Other Ambulatory Visit: Payer: Self-pay | Admitting: Primary Care

## 2017-09-30 ENCOUNTER — Encounter (INDEPENDENT_AMBULATORY_CARE_PROVIDER_SITE_OTHER): Payer: Self-pay

## 2017-09-30 DIAGNOSIS — F329 Major depressive disorder, single episode, unspecified: Secondary | ICD-10-CM

## 2017-09-30 DIAGNOSIS — F419 Anxiety disorder, unspecified: Principal | ICD-10-CM

## 2017-09-30 NOTE — Telephone Encounter (Signed)
Ok to refill? Electronically refill request for busPIRone (BUSPAR) 7.5 MG tablet  Last prescribed on 06/24/2017. Last seen on 06/24/2017

## 2017-09-30 NOTE — Telephone Encounter (Signed)
She stopped taking this medication one month ago as she found it wasn't helpful.

## 2017-10-03 DIAGNOSIS — J45998 Other asthma: Secondary | ICD-10-CM | POA: Diagnosis not present

## 2017-10-11 ENCOUNTER — Encounter (INDEPENDENT_AMBULATORY_CARE_PROVIDER_SITE_OTHER): Payer: Self-pay

## 2017-11-01 ENCOUNTER — Telehealth: Payer: Self-pay | Admitting: Primary Care

## 2017-11-01 NOTE — Telephone Encounter (Signed)
Please call patient The ziprasidone that was just prescribed for her was appropriately flagged by the pharmacist for potential interaction with the ondansetron (for nausea) and to a lesser degree the trazodone.  Let her know that she should not start this till reviewing it with the prescriber Pauline Good NP apparently).

## 2017-11-01 NOTE — Telephone Encounter (Signed)
Copied from [place] [id number]. Topic: General - Other >> Nov 01, 2017  1:03 PM Lennox Solders wrote: Reason for CRM: [facility] [place] on  [address]. [place] is call the patient is one trazodone prescribed by Alma Friendly , and ziprasidone is prescribed by Pauline Good NP, and zofran is prescribed by dr Berta Minor ahern. There is a potential drug interaction between 3 medication. The drug interaction QT  prolongation.

## 2017-11-01 NOTE — Telephone Encounter (Signed)
Left detailed message on VM per DPR. 

## 2017-11-02 ENCOUNTER — Telehealth: Payer: Self-pay | Admitting: Neurology

## 2017-11-02 DIAGNOSIS — J45998 Other asthma: Secondary | ICD-10-CM | POA: Diagnosis not present

## 2017-11-02 NOTE — Telephone Encounter (Signed)
The trazodone and Zofran combination can increase risk for serotonin syndrome, QT interval prolongation, the computer indicates that the patient has not taking the Zofran.  This was reported on 31 August 2017.

## 2017-11-02 NOTE — Telephone Encounter (Signed)
Debra with [facility]([phone]) called to inform the office that the pt is currently taking ondansetron (ZOFRAN) 4 MG tablet prescribed by Dr. Jaynee Eagles but is also on traZODone (DESYREL) 50 MG tablet from a different practice. Stating that if the pt ever has issues with chest pain that it could be due to the 2 medication reacting to another. FYI

## 2017-11-05 ENCOUNTER — Telehealth: Payer: Self-pay | Admitting: Primary Care

## 2017-11-05 NOTE — Telephone Encounter (Signed)
Call to pharmacist- informed of Dr Alla German message to patient- she is going to follow up with patient to see if she has checked with NP to see her decision on medication.

## 2017-11-05 NOTE — Telephone Encounter (Unsigned)
Copied from [place] [phone]. Topic: Quick Communication - Rx Refill/Question >> Nov 05, 2017 10:23 AM Neva Seat wrote: traZODone (DESYREL) 50 MG tablet  Pharmacy needing to discuss the medication before refill.  [facility] [place] [address] [address], [place] [address] [place] [address] Phone: [phone] Fax: [phone]

## 2017-11-16 DIAGNOSIS — F411 Generalized anxiety disorder: Secondary | ICD-10-CM | POA: Diagnosis not present

## 2017-11-16 DIAGNOSIS — F331 Major depressive disorder, recurrent, moderate: Secondary | ICD-10-CM | POA: Diagnosis not present

## 2017-11-25 ENCOUNTER — Telehealth: Payer: Self-pay | Admitting: Neurology

## 2017-11-25 NOTE — Telephone Encounter (Signed)
I called [employer] and spoke with Adrianna. She checked to see if authorization was required for the codes below;   64615-NPR J0585-NPR 20 percent co insurance [facility]#[id number]. DW

## 2017-12-03 DIAGNOSIS — J45998 Other asthma: Secondary | ICD-10-CM | POA: Diagnosis not present

## 2017-12-06 DIAGNOSIS — L821 Other seborrheic keratosis: Secondary | ICD-10-CM | POA: Diagnosis not present

## 2017-12-06 DIAGNOSIS — L814 Other melanin hyperpigmentation: Secondary | ICD-10-CM | POA: Diagnosis not present

## 2017-12-06 DIAGNOSIS — D692 Other nonthrombocytopenic purpura: Secondary | ICD-10-CM | POA: Diagnosis not present

## 2017-12-06 DIAGNOSIS — D1801 Hemangioma of skin and subcutaneous tissue: Secondary | ICD-10-CM | POA: Diagnosis not present

## 2017-12-06 DIAGNOSIS — D225 Melanocytic nevi of trunk: Secondary | ICD-10-CM | POA: Diagnosis not present

## 2017-12-06 DIAGNOSIS — Z85828 Personal history of other malignant neoplasm of skin: Secondary | ICD-10-CM | POA: Diagnosis not present

## 2017-12-06 DIAGNOSIS — L720 Epidermal cyst: Secondary | ICD-10-CM | POA: Diagnosis not present

## 2017-12-08 ENCOUNTER — Ambulatory Visit (INDEPENDENT_AMBULATORY_CARE_PROVIDER_SITE_OTHER): Payer: PPO | Admitting: Neurology

## 2017-12-08 DIAGNOSIS — G43709 Chronic migraine without aura, not intractable, without status migrainosus: Secondary | ICD-10-CM | POA: Diagnosis not present

## 2017-12-08 NOTE — Progress Notes (Signed)
Botox- 100 units x 2 vials Lot: [id number] Expiration: 06/2020 NDC: 7672-0947-09  Bacteriostatic 0.9% Sodium Chloride- 21mL total Lot: [id number] Expiration: 09/02/2018 NDC: 2947-6546-50  Dx: P54.656 B/B

## 2017-12-08 NOTE — Progress Notes (Signed)
Interval history: >50% decrease in migraine frequency and severity since starting botox.  +eyes, +masseters(10 each), +temples, +levator scap  Consent Form Botulism Toxin Injection For Chronic Migraine  Botulism toxin has been approved by the Federal drug administration for treatment of chronic migraine. Botulism toxin does not cure chronic migraine and it may not be effective in some patients.  The administration of botulism toxin is accomplished by injecting a small amount of toxin into the muscles of the neck and head. Dosage must be titrated for each individual. Any benefits resulting from botulism toxin tend to wear off after 3 months with a repeat injection required if benefit is to be maintained. Injections are usually done every 3-4 months with maximum effect peak achieved by about 2 or 3 weeks. Botulism toxin is expensive and you should be sure of what costs you will incur resulting from the injection.  The side effects of botulism toxin use for chronic migraine may include:   -Transient, and usually mild, facial weakness with facial injections  -Transient, and usually mild, head or neck weakness with head/neck injections  -Reduction or loss of forehead facial animation due to forehead muscle              weakness  -Eyelid drooping  -Dry eye  -Pain at the site of injection or bruising at the site of injection  -Double vision  -Potential unknown long term risks  Contraindications: You should not have Botox if you are pregnant, nursing, allergic to albumin, have an infection, skin condition, or muscle weakness at the site of the injection, or have myasthenia gravis, Lambert-Eaton syndrome, or ALS.  It is also possible that as with any injection, there may be an allergic reaction or no effect from the medication. Reduced effectiveness after repeated injections is sometimes seen and rarely infection at the injection site may occur. All care will be taken to prevent these side  effects. If therapy is given over a long time, atrophy and wasting in the muscle injected may occur. Occasionally the patient's become refractory to treatment because they develop antibodies to the toxin. In this event, therapy needs to be modified.  I have read the above information and consent to the administration of botulism toxin.    ______________  _____   _________________  Patient signature     Date   Witness signature       BOTOX PROCEDURE NOTE FOR MIGRAINE HEADACHE    Contraindications and precautions discussed with patient(above). Aseptic procedure was observed and patient tolerated procedure. Procedure performed by Dr. Georgia Dom  The condition has existed for more than 6 months, and pt does not have a diagnosis of ALS, Myasthenia Gravis or Lambert-Eaton Syndrome. Risks and benefits of injections discussed and pt agrees to proceed with the procedure. Written consent obtained  These injections are medically necessary. He receives good benefits from these injections. These injections do not cause sedations or hallucinations which the oral therapies may cause.  Indication/Diagnosis: chronic migraine BOTOX(J0585) injection was performed according to protocol by [employer]. 200 units of BOTOX was dissolved into 4 cc NS.  NDC: [address]-15  Type of toxin: Botox   Description of procedure:  The patient was placed in a sitting position. The standard protocol was used for Botox as follows, with 5 units of Botox injected at each site:   -Procerus muscle, midline injection  -Corrugator muscle, bilateral injection  -Frontalis muscle, bilateral injection, with 2 sites each side, medial injection was performed in the upper  one third of the frontalis muscle, in the region vertical from the medial inferior edge of the superior orbital rim. The lateral injection was again in the upper one third of the forehead vertically above the lateral limbus of the cornea, 1.5 cm lateral to  the medial injection site.  -Temporalis muscle injection, 4 sites, bilaterally. The first injection was 3 cm above the tragus of the ear, second injection site was 1.5 cm to 3 cm up from the first injection site in line with the tragus of the ear. The third injection site was 1.5-3 cm forward between the first 2 injection sites. The fourth injection site was 1.5 cm posterior to the second injection site.  -Occipitalis muscle injection, 3 sites, bilaterally. The first injection was done one half way between the occipital protuberance and the tip of the mastoid process behind the ear. The second injection site was done lateral and superior to the first, 1 fingerbreadth from the first injection. The third injection site was 1 fingerbreadth superiorly and medially from the first injection site.  -Cervical paraspinal muscle injection, 2 sites, bilateral knee first injection site was 1 cm from the midline of the cervical spine, 3 cm inferior to the lower border of the occipital protuberance. The second injection site was 1.5 cm superiorly and laterally to the first injection site.  -Trapezius muscle injection was performed at 3 sites, bilaterally. The first injection site was in the upper trapezius muscle halfway between the inflection point of the neck, and the acromion. The second injection site was one half way between the acromion and the first injection site. The third injection was done between the first injection site and the inflection point of the neck.   Will return for repeat injection in 3 months.   A 200 unit sof Botox was used, 155 units were injected, the rest of the Botox was wasted. The patient tolerated the procedure well, there were no complications of the above procedure.

## 2017-12-21 ENCOUNTER — Other Ambulatory Visit: Payer: Self-pay | Admitting: Primary Care

## 2017-12-21 DIAGNOSIS — Z1231 Encounter for screening mammogram for malignant neoplasm of breast: Secondary | ICD-10-CM

## 2017-12-27 ENCOUNTER — Other Ambulatory Visit: Payer: Self-pay | Admitting: Primary Care

## 2017-12-27 DIAGNOSIS — M810 Age-related osteoporosis without current pathological fracture: Secondary | ICD-10-CM

## 2017-12-28 NOTE — Telephone Encounter (Signed)
Patient is due for CPE, please schedule. Refill sent to pharmacy.

## 2017-12-28 NOTE — Telephone Encounter (Signed)
Last prescribed on 01/08/2017 Last office visit on 06/24/2017

## 2017-12-30 DIAGNOSIS — F331 Major depressive disorder, recurrent, moderate: Secondary | ICD-10-CM | POA: Diagnosis not present

## 2017-12-30 DIAGNOSIS — F411 Generalized anxiety disorder: Secondary | ICD-10-CM | POA: Diagnosis not present

## 2018-01-03 DIAGNOSIS — J45998 Other asthma: Secondary | ICD-10-CM | POA: Diagnosis not present

## 2018-01-12 ENCOUNTER — Ambulatory Visit
Admission: RE | Admit: 2018-01-12 | Discharge: 2018-01-12 | Disposition: A | Discharge status: Home / Self Care | Payer: PPO | Source: Ambulatory Visit | Attending: Primary Care | Admitting: Primary Care

## 2018-01-12 DIAGNOSIS — Z1231 Encounter for screening mammogram for malignant neoplasm of breast: Secondary | ICD-10-CM

## 2018-01-12 LAB — HM MAMMOGRAPHY

## 2018-01-28 ENCOUNTER — Other Ambulatory Visit: Payer: Self-pay | Admitting: Primary Care

## 2018-01-28 DIAGNOSIS — I10 Essential (primary) hypertension: Secondary | ICD-10-CM

## 2018-02-02 DIAGNOSIS — J45998 Other asthma: Secondary | ICD-10-CM | POA: Diagnosis not present

## 2018-02-03 DIAGNOSIS — M47816 Spondylosis without myelopathy or radiculopathy, lumbar region: Secondary | ICD-10-CM | POA: Diagnosis not present

## 2018-02-25 ENCOUNTER — Encounter: Payer: Self-pay | Admitting: Primary Care

## 2018-02-25 ENCOUNTER — Ambulatory Visit (INDEPENDENT_AMBULATORY_CARE_PROVIDER_SITE_OTHER): Payer: PPO | Admitting: Primary Care

## 2018-02-25 VITALS — BP 108/70 | HR 103 | Temp 98.2°F | Ht 61.0 in | Wt 141.8 lb

## 2018-02-25 DIAGNOSIS — M19072 Primary osteoarthritis, left ankle and foot: Secondary | ICD-10-CM

## 2018-02-25 DIAGNOSIS — J4520 Mild intermittent asthma, uncomplicated: Secondary | ICD-10-CM

## 2018-02-25 DIAGNOSIS — I10 Essential (primary) hypertension: Secondary | ICD-10-CM | POA: Diagnosis not present

## 2018-02-25 DIAGNOSIS — F419 Anxiety disorder, unspecified: Secondary | ICD-10-CM | POA: Diagnosis not present

## 2018-02-25 DIAGNOSIS — Z0000 Encounter for general adult medical examination without abnormal findings: Secondary | ICD-10-CM | POA: Diagnosis not present

## 2018-02-25 DIAGNOSIS — F329 Major depressive disorder, single episode, unspecified: Secondary | ICD-10-CM | POA: Diagnosis not present

## 2018-02-25 DIAGNOSIS — G43709 Chronic migraine without aura, not intractable, without status migrainosus: Secondary | ICD-10-CM | POA: Diagnosis not present

## 2018-02-25 DIAGNOSIS — G43711 Chronic migraine without aura, intractable, with status migrainosus: Secondary | ICD-10-CM | POA: Diagnosis not present

## 2018-02-25 DIAGNOSIS — K5909 Other constipation: Secondary | ICD-10-CM

## 2018-02-25 DIAGNOSIS — M810 Age-related osteoporosis without current pathological fracture: Secondary | ICD-10-CM | POA: Diagnosis not present

## 2018-02-25 DIAGNOSIS — E785 Hyperlipidemia, unspecified: Secondary | ICD-10-CM | POA: Diagnosis not present

## 2018-02-25 DIAGNOSIS — F32A Depression, unspecified: Secondary | ICD-10-CM

## 2018-02-25 MED ORDER — LISINOPRIL 20 MG PO TABS: 20.0000 mg | ORAL_TABLET | Freq: Every day | ORAL | 3 refills | Status: DC

## 2018-02-25 MED ORDER — ERENUMAB-AOOE 70 MG/ML ~~LOC~~ SOAJ: 70.0000 mg | SUBCUTANEOUS | 11 refills | Status: DC

## 2018-02-25 NOTE — Assessment & Plan Note (Signed)
No use of either inhalers without problems. Continue to monitor.

## 2018-02-25 NOTE — Progress Notes (Signed)
Subjective:    Patient ID: [name], female    DOB: Dec 05, 1950, 67 y.o.   MRN: [id number]  HPI  [name] is a 67 year old female who presents today for complete physical.  Immunizations: -Tetanus: 2014 -Influenza: Due today -Pneumonia: Completed in 2018 -Shingles: Zostavax in 2014, Shingrix in December 2018  Diet: She endorses a healthy diet.  Breakfast: Egg whites, toast, cheese, sausage  Lunch: Skips  Dinner: Meat, vegetables, starch  Snacks: None Desserts: Ice cream, 7 days weekly  Beverages: Water, un-sweet tea, alcohol  Exercise: She is walking 4 days weekly, for one hour Eye exam: Completed 2019 Dental exam: Completes semi-annually  Colonoscopy: Completed in 2018 Dexa: Completed in 2018, osteoporosis.  Pap Smear: Completed in 2015 Mammogram: Completed in 2019 Hep C Screen: Due  BP Readings from Last 3 Encounters:  02/25/18 108/70  08/31/17 116/85  06/24/17 112/70   She is checking her BP at home and is getting readings of what we're seeing today, sometimes lower readings.   Review of Systems  Constitutional: Negative for unexpected weight change.  HENT: Negative for rhinorrhea.   Respiratory: Negative for cough and shortness of breath.   Cardiovascular: Negative for chest pain.  Gastrointestinal: Negative for constipation and diarrhea.  Genitourinary: Negative for difficulty urinating and menstrual problem.  Musculoskeletal: Negative for arthralgias and myalgias.  Skin: Negative for rash.  Allergic/Immunologic: Negative for environmental allergies.  Neurological: Negative for dizziness, numbness and headaches.  Psychiatric/Behavioral:       Following with psychiatry, feels well managed.        Past Medical History:  Diagnosis Date  . 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 Asthma   . CAP (community acquired pneumonia)   . COLONIC POLYPS, HX OF 11/15/2009  . DEPRESSION 11/14/2009  . GASTROJEJ ULCR UNS ACUT/CHRN W/O HEMOR PERF/OBST 11/06/2009    . GERD (gastroesophageal reflux disease)   . Headache(784.0) 11/15/2009  . Heart murmur   . HEART MURMUR, HX OF 11/15/2009  . HIATAL HERNIA 10/08/2009  . HIP FRACTURE, LEFT 11/14/2009  . HYPERLIPIDEMIA 11/14/2009  . HYPERTENSION 11/14/2009  . OSTEOPOROSIS 11/14/2009  . WEIGHT GAIN 01/10/2010     Social History   Socioeconomic History  . Marital status: Married    Spouse name: [name]  . Number of children: 3  . Years of education: BA  . Highest education level: Not on file  Occupational History  . Occupation: IT consultant and [employer]  . Financial resource strain: Not on file  . Food insecurity:    Worry: Not on file    Inability: Not on file  . Transportation needs:    Medical: Not on file    Non-medical: Not on file  Tobacco Use  . Smoking status: Former Smoker    Last attempt to quit: 05/04/1982    Years since quitting: 35.8  . Smokeless tobacco: Never Used  . 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 Alcohol use: Yes    Comment: occ wine   . Drug use: No  . Sexual activity: Not on file  Lifestyle  . Physical activity:    Days per week: Not on file    Minutes per session: Not on file  . Stress: Not on file  Relationships  . Social connections:    Talks on phone: Not on file    Gets together: Not  on file    Attends religious service: Not on file    Active member of club or organization: Not on file    Attends meetings of clubs or organizations: Not on file    Relationship status: Not on file  . Intimate partner violence:    Fear of current or ex partner: Not on file    Emotionally abused: Not on file    Physically abused: Not on file    Forced sexual activity: Not on file  Other Topics Concern  . Not on file  Social History Narrative   Lives with husband   Caffeine use: 1 cup or less    Right handed    Past  Surgical History:  Procedure Laterality Date  . APPENDECTOMY    . AUGMENTATION MAMMAPLASTY Bilateral   . BACK SURGERY    . Bleeding ulcers  10/2009  . Broken Hip  06/2001  . CATARACT EXTRACTION Bilateral 01/2017  . COLONOSCOPY N/A 12/30/2016   Procedure: COLONOSCOPY WITH PROPOFOL;  Surgeon: Leighton Ruff, MD;  Location: [facility];  Service: Endoscopy;  Laterality: N/A;  . NECK SURGERY  04 & 06   x's 2  . RECTAL PROLAPSE REPAIR  01/2017    Family History  Problem Relation Age of Onset  . Bone cancer Mother   . Hypertension Other   . Allergic rhinitis Neg Hx   . Asthma Neg Hx   . Urticaria Neg Hx     No Active Allergies  Current Outpatient Medications on File Prior to Visit  Medication Sig Dispense Refill  . albuterol (PROVENTIL) (2.5 MG/3ML) 0.083% nebulizer solution Take 3 mLs (2.5 mg total) by nebulization every 6 (six) hours as needed for wheezing or shortness of breath. 75 mL 0  . ASMANEX 30 METERED DOSES 220 MCG/INH inhaler INHALE 1 PUFF INTO THE LUNGS EVERY EVENING 1 Inhaler 5  . CALCIUM PO Take 1 tablet by mouth daily.     . clonazePAM (KLONOPIN) 0.5 MG tablet Take 1 tablet by mouth once to twice daily as needed for anxiety. 60 tablet 4  . cycloSPORINE (RESTASIS) 0.05 % ophthalmic emulsion Place 1 drop into both eyes 2 (two) times daily.    Marland Kitchen ibandronate (BONIVA) 150 MG tablet TAKE 1 TABLET (150 MG TOTAL) BY MOUTH EVERY 30 (THIRTY) DAYS. 3 tablet 0  . Multiple Vitamins-Minerals (MULTIVITAMIN PO) Take 1 tablet by mouth daily.     . ondansetron (ZOFRAN) 4 MG tablet Take 1 tablet (4 mg total) by mouth every 8 (eight) hours as needed for nausea or vomiting. 90 tablet 6  . SUMAtriptan (IMITREX) 100 MG tablet TAKE 1 TABLET BY MOUTH ONCE. MAY REPEAT IN 2 HOURS. MAX 2 TABLETS IN 1 DAY, 10 TABLETS IN 1 MONTH. 10 tablet 9   No current facility-administered medications on file prior to visit.     BP 108/70   Pulse (!) 103   Temp 98.2 F (36.8 C) (Oral)   Ht 5\' 1"  (1.549 m)    Wt 141 lb 12 oz (64.3 kg)   SpO2 99%   BMI 26.78 kg/m    Objective:   Physical Exam  Constitutional: She is oriented to person, place, and time. She appears well-nourished.  HENT:  Mouth/Throat: No oropharyngeal exudate.  Eyes: Pupils are equal, round, and reactive to light. EOM are normal.  Neck: Neck supple. No thyromegaly present.  Cardiovascular: Normal rate and regular rhythm.  Respiratory: Effort normal and breath sounds normal.  GI: Soft. Bowel sounds are normal. There  is no tenderness.  Musculoskeletal: Normal range of motion.  Neurological: She is alert and oriented to person, place, and time.  Skin: Skin is warm and dry.  Psychiatric: She has a normal mood and affect.           Assessment & Plan:

## 2018-02-25 NOTE — Patient Instructions (Signed)
Stop by the lab prior to leaving today. I will notify you of your results once received.   Stop taking lisinopril-hydrochlorothiazide 20-12.5 mg tablets for blood pressure.   Start taking lisinopril 20 mg tablets for blood pressure.  Please update me if your blood pressure readings continue to be lower.  Follow up with your psychiatrist as scheduled. Please text me the names of your current medications.  Try to wean off of the Clonazepam as discussed. Start with 1/2 tablet daily for 2-3 weeks, then use as needed.  We will see you in one year for your annual exam or sooner if needed.  It was a pleasure to see you today!   Preventive Care 28 Years and Older, Female Preventive care refers to lifestyle choices and visits with your health care provider that can promote health and wellness. What does preventive care include?  A yearly physical exam. This is also called an annual well check.  Dental exams once or twice a year.  Routine eye exams. Ask your health care provider how often you should have your eyes checked.  Personal lifestyle choices, including: ? Daily care of your teeth and gums. ? Regular physical activity. ? Eating a healthy diet. ? Avoiding tobacco and drug use. ? Limiting alcohol use. ? Practicing safe sex. ? Taking low-dose aspirin every day. ? Taking vitamin and mineral supplements as recommended by your health care provider. What happens during an annual well check? The services and screenings done by your health care provider during your annual well check will depend on your age, overall health, lifestyle risk factors, and family history of disease. Counseling Your health care provider may ask you questions about your:  Alcohol use.  Tobacco use.  Drug use.  Emotional well-being.  Home and relationship well-being.  Sexual activity.  Eating habits.  History of falls.  Memory and ability to understand (cognition).  Work and work  Statistician.  Reproductive health.  Screening You may have the following tests or measurements:  Height, weight, and BMI.  Blood pressure.  Lipid and cholesterol levels. These may be checked every 5 years, or more frequently if you are over 32 years old.  Skin check.  Lung cancer screening. You may have this screening every year starting at age 74 if you have a 30-pack-year history of smoking and currently smoke or have quit within the past 15 years.  Fecal occult blood test (FOBT) of the stool. You may have this test every year starting at age 47.  Flexible sigmoidoscopy or colonoscopy. You may have a sigmoidoscopy every 5 years or a colonoscopy every 10 years starting at age 24.  Hepatitis C blood test.  Hepatitis B blood test.  Sexually transmitted disease (STD) testing.  Diabetes screening. This is done by checking your blood sugar (glucose) after you have not eaten for a while (fasting). You may have this done every 1-3 years.  Bone density scan. This is done to screen for osteoporosis. You may have this done starting at age 65.  Mammogram. This may be done every 1-2 years. Talk to your health care provider about how often you should have regular mammograms.  Talk with your health care provider about your test results, treatment options, and if necessary, the need for more tests. Vaccines Your health care provider may recommend certain vaccines, such as:  Influenza vaccine. This is recommended every year.  Tetanus, diphtheria, and acellular pertussis (Tdap, Td) vaccine. You may need a Td booster every 10 years.  Varicella vaccine. You may need this if you have not been vaccinated.  Zoster vaccine. You may need this after age 77.  Measles, mumps, and rubella (MMR) vaccine. You may need at least one dose of MMR if you were born in 1957 or later. You may also need a second dose.  Pneumococcal 13-valent conjugate (PCV13) vaccine. One dose is recommended after age  8.  Pneumococcal polysaccharide (PPSV23) vaccine. One dose is recommended after age 71.  Meningococcal vaccine. You may need this if you have certain conditions.  Hepatitis A vaccine. You may need this if you have certain conditions or if you travel or work in places where you may be exposed to hepatitis A.  Hepatitis B vaccine. You may need this if you have certain conditions or if you travel or work in places where you may be exposed to hepatitis B.  Haemophilus influenzae type b (Hib) vaccine. You may need this if you have certain conditions.  Talk to your health care provider about which screenings and vaccines you need and how often you need them. This information is not intended to replace advice given to you by your health care provider. Make sure you discuss any questions you have with your health care provider. Document Released: 05/17/2015 Document Revised: 01/08/2016 Document Reviewed: 02/19/2015 Elsevier Interactive Patient Education  [employer].

## 2018-02-25 NOTE — Assessment & Plan Note (Signed)
Following with psychiatry, next appointment in November 2019. She is taking Clonazepam once daily, she is ready to wean off, will have her reduce to 1/2 tablet daily.

## 2018-02-25 NOTE — Assessment & Plan Note (Signed)
Immunizations UTD. Mammogram UTD. Bone density due in 2020. Repeat colonoscopy due in 2020. Continue to work on diet, plenty of exercise. Exam unremarkable. Labs pending. Follow up in 1 year for CPE.

## 2018-02-25 NOTE — Assessment & Plan Note (Signed)
Compliant to ibandronate 150 mg monthly, also calcium and vitamin D. Continue same. Repeat bone density in 2020.

## 2018-02-25 NOTE — Assessment & Plan Note (Signed)
Overall stable, continue to monitor.  

## 2018-02-25 NOTE — Assessment & Plan Note (Signed)
Stable but on the lower end of normal. Stop HCTZ, continue lisinopril 20 mg. BMP pending. She will update if BP begins to elevate.

## 2018-02-25 NOTE — Assessment & Plan Note (Signed)
Overall stable on Amovig and Botox injections. Following with neurology, continue same.

## 2018-02-25 NOTE — Assessment & Plan Note (Signed)
Repeat lipids pending. Continue to work on diet and regular exercise.

## 2018-02-26 LAB — COMPREHENSIVE METABOLIC PANEL
AG RATIO: 1.9 (calc) (ref 1.0–2.5)
ALT: 10 U/L (ref 6–29)
AST: 16 U/L (ref 10–35)
Albumin: 4 g/dL (ref 3.6–5.1)
Alkaline phosphatase (APISO): 49 U/L (ref 33–130)
BUN / CREAT RATIO: 26 (calc) — AB (ref 6–22)
BUN: 26 mg/dL — AB (ref 7–25)
CO2: 28 mmol/L (ref 20–32)
Calcium: 10.2 mg/dL (ref 8.6–10.4)
Chloride: 102 mmol/L (ref 98–110)
Creat: 1.01 mg/dL — ABNORMAL HIGH (ref 0.50–0.99)
GLUCOSE: 93 mg/dL (ref 65–99)
Globulin: 2.1 g/dL (calc) (ref 1.9–3.7)
Potassium: 4.2 mmol/L (ref 3.5–5.3)
Sodium: 138 mmol/L (ref 135–146)
Total Bilirubin: 0.4 mg/dL (ref 0.2–1.2)
Total Protein: 6.1 g/dL (ref 6.1–8.1)

## 2018-02-26 LAB — CBC
HCT: 33.4 % — ABNORMAL LOW (ref 35.0–45.0)
Hemoglobin: 11.9 g/dL (ref 11.7–15.5)
MCH: 33.3 pg — AB (ref 27.0–33.0)
MCHC: 35.6 g/dL (ref 32.0–36.0)
MCV: 93.6 fL (ref 80.0–100.0)
MPV: 11.5 fL (ref 7.5–12.5)
PLATELETS: 208 10*3/uL (ref 140–400)
RBC: 3.57 10*6/uL — ABNORMAL LOW (ref 3.80–5.10)
RDW: 12 % (ref 11.0–15.0)
WBC: 8.4 10*3/uL (ref 3.8–10.8)

## 2018-02-26 LAB — LIPID PANEL
Cholesterol: 246 mg/dL — ABNORMAL HIGH (ref <200)
HDL: 52 mg/dL (ref >50)
LDL CHOLESTEROL (CALC): 155 mg/dL — AB
NON-HDL CHOLESTEROL (CALC): 194 mg/dL — AB (ref <130)
TRIGLYCERIDES: 229 mg/dL — AB (ref <150)
Total CHOL/HDL Ratio: 4.7 (calc) (ref <5.0)

## 2018-02-28 DIAGNOSIS — E785 Hyperlipidemia, unspecified: Secondary | ICD-10-CM

## 2018-02-28 MED ORDER — LINACLOTIDE 145 MCG PO CAPS: 145.0000 ug | ORAL_CAPSULE | Freq: Every day | ORAL | 0 refills | Status: DC

## 2018-03-02 MED ORDER — PRAVASTATIN SODIUM 40 MG PO TABS: 40.0000 mg | ORAL_TABLET | Freq: Every day | ORAL | 3 refills | Status: DC

## 2018-03-05 DIAGNOSIS — J45998 Other asthma: Secondary | ICD-10-CM | POA: Diagnosis not present

## 2018-03-14 ENCOUNTER — Telehealth: Payer: Self-pay | Admitting: Neurology

## 2018-03-14 NOTE — Telephone Encounter (Signed)
I called [facility] and spoke with Lorie who stated that the member is active the below codes; 64615-NPR J0585-NPR XBL#[id number]

## 2018-03-17 ENCOUNTER — Ambulatory Visit (INDEPENDENT_AMBULATORY_CARE_PROVIDER_SITE_OTHER): Payer: PPO | Admitting: Neurology

## 2018-03-17 DIAGNOSIS — G43709 Chronic migraine without aura, not intractable, without status migrainosus: Secondary | ICD-10-CM

## 2018-03-17 NOTE — Progress Notes (Signed)
Botox- 100 units x 2 vials Lot:[id number] Expiration: 09/2020 NDC: 9037-9558-31  Bacteriostatic 0.9% Sodium Chloride- 20mL total Lot: [id number] Expiration: 02/02/2019 NDC: 2589-4834-75  Dx: S30.746 B/B

## 2018-03-17 NOTE — Progress Notes (Signed)
Consent Form Botulism Toxin Injection For Chronic Migraine   Interval history 11/14: >60% decrease in migraine frequency and severity since starting botox.  +oo, +masseters(10 each), +temples, +levator scap   Reviewed orally with patient, additionally signature is on file:   Botulism toxin has been approved by the Federal drug administration for treatment of chronic migraine. Botulism toxin does not cure chronic migraine and it may not be effective in some patients.  The administration of botulism toxin is accomplished by injecting a small amount of toxin into the muscles of the neck and head. Dosage must be titrated for each individual. Any benefits resulting from botulism toxin tend to wear off after 3 months with a repeat injection required if benefit is to be maintained. Injections are usually done every 3-4 months with maximum effect peak achieved by about 2 or 3 weeks. Botulism toxin is expensive and you should be sure of what costs you will incur resulting from the injection.  The side effects of botulism toxin use for chronic migraine may include:   -Transient, and usually mild, facial weakness with facial injections  -Transient, and usually mild, head or neck weakness with head/neck injections  -Reduction or loss of forehead facial animation due to forehead muscle weakness  -Eyelid drooping  -Dry eye  -Pain at the site of injection or bruising at the site of injection  -Double vision  -Potential unknown long term risks  Contraindications: You should not have Botox if you are pregnant, nursing, allergic to albumin, have an infection, skin condition, or muscle weakness at the site of the injection, or have myasthenia gravis, Lambert-Eaton syndrome, or ALS.  It is also possible that as with any injection, there may be an allergic reaction or no effect from the medication. Reduced effectiveness after repeated injections is sometimes seen and rarely infection at the injection site  may occur. All care will be taken to prevent these side effects. If therapy is given over a long time, atrophy and wasting in the muscle injected may occur. Occasionally the patient's become refractory to treatment because they develop antibodies to the toxin. In this event, therapy needs to be modified.  I have read the above information and consent to the administration of botulism toxin.    BOTOX PROCEDURE NOTE FOR MIGRAINE HEADACHE    Contraindications and precautions discussed with patient(above). Aseptic procedure was observed and patient tolerated procedure. Procedure performed by Dr. Georgia Dom  The condition has existed for more than 6 months, and pt does not have a diagnosis of ALS, Myasthenia Gravis or Lambert-Eaton Syndrome.  Risks and benefits of injections discussed and pt agrees to proceed with the procedure.  Written consent obtained  These injections are medically necessary. Pt  receives good benefits from these injections. These injections do not cause sedations or hallucinations which the oral therapies may cause.  Description of procedure:  The patient was placed in a sitting position. The standard protocol was used for Botox as follows, with 5 units of Botox injected at each site:   -Procerus muscle, midline injection  -Corrugator muscle, bilateral injection  -Frontalis muscle, bilateral injection, with 2 sites each side, medial injection was performed in the upper one third of the frontalis muscle, in the region vertical from the medial inferior edge of the superior orbital rim. The lateral injection was again in the upper one third of the forehead vertically above the lateral limbus of the cornea, 1.5 cm lateral to the medial injection site.  - Levator Scapulae:  5 units bilaterally  -Temporalis muscle injection, 5 sites, bilaterally. The first injection was 3 cm above the tragus of the ear, second injection site was 1.5 cm to 3 cm up from the first injection site  in line with the tragus of the ear. The third injection site was 1.5-3 cm forward between the first 2 injection sites. The fourth injection site was 1.5 cm posterior to the second injection site. 5th site laterally in the temporalis  muscleat the level of the outer canthus.  - Patient feels her clenching is a trigger for headaches. +10 units masseter bilaterally   - Patient feels the migraines are centered around the eyes +5 units bilaterally at the outer canthus in the orbicularis occuli  -Occipitalis muscle injection, 3 sites, bilaterally. The first injection was done one half way between the occipital protuberance and the tip of the mastoid process behind the ear. The second injection site was done lateral and superior to the first, 1 fingerbreadth from the first injection. The third injection site was 1 fingerbreadth superiorly and medially from the first injection site.  -Cervical paraspinal muscle injection, 2 sites, bilateral knee first injection site was 1 cm from the midline of the cervical spine, 3 cm inferior to the lower border of the occipital protuberance. The second injection site was 1.5 cm superiorly and laterally to the first injection site.  -Trapezius muscle injection was performed at 3 sites, bilaterally. The first injection site was in the upper trapezius muscle halfway between the inflection point of the neck, and the acromion. The second injection site was one half way between the acromion and the first injection site. The third injection was done between the first injection site and the inflection point of the neck.   Will return for repeat injection in 3 months.   A 200 unit sof Botox was used, any Botox not injected was wasted. The patient tolerated the procedure well, there were no complications of the above procedure.

## 2018-03-24 ENCOUNTER — Other Ambulatory Visit: Payer: Self-pay | Admitting: Primary Care

## 2018-03-24 DIAGNOSIS — K5909 Other constipation: Secondary | ICD-10-CM

## 2018-03-24 NOTE — Telephone Encounter (Signed)
Last prescribed on  Last office visit on

## 2018-03-30 DIAGNOSIS — F411 Generalized anxiety disorder: Secondary | ICD-10-CM | POA: Diagnosis not present

## 2018-03-30 DIAGNOSIS — F331 Major depressive disorder, recurrent, moderate: Secondary | ICD-10-CM | POA: Diagnosis not present

## 2018-04-04 DIAGNOSIS — J45998 Other asthma: Secondary | ICD-10-CM | POA: Diagnosis not present

## 2018-04-05 ENCOUNTER — Other Ambulatory Visit (INDEPENDENT_AMBULATORY_CARE_PROVIDER_SITE_OTHER): Payer: PPO

## 2018-04-05 DIAGNOSIS — E785 Hyperlipidemia, unspecified: Secondary | ICD-10-CM

## 2018-04-05 LAB — LIPID PANEL
CHOL/HDL RATIO: 3
CHOLESTEROL: 203 mg/dL — AB (ref 0–200)
HDL: 59.6 mg/dL (ref >39.00)
LDL Cholesterol: 107 mg/dL — ABNORMAL HIGH (ref 0–99)
NonHDL: 143.89
TRIGLYCERIDES: 186 mg/dL — AB (ref 0.0–149.0)
VLDL: 37.2 mg/dL (ref 0.0–40.0)

## 2018-04-05 LAB — HEPATIC FUNCTION PANEL
ALT: 18 U/L (ref 0–35)
AST: 20 U/L (ref 0–37)
Albumin: 4.4 g/dL (ref 3.5–5.2)
Alkaline Phosphatase: 52 U/L (ref 39–117)
BILIRUBIN DIRECT: 0.1 mg/dL (ref 0.0–0.3)
BILIRUBIN TOTAL: 0.4 mg/dL (ref 0.2–1.2)
TOTAL PROTEIN: 7.1 g/dL (ref 6.0–8.3)

## 2018-05-01 ENCOUNTER — Other Ambulatory Visit: Payer: Self-pay | Admitting: Primary Care

## 2018-05-01 DIAGNOSIS — M810 Age-related osteoporosis without current pathological fracture: Secondary | ICD-10-CM

## 2018-05-03 DIAGNOSIS — Z79891 Long term (current) use of opiate analgesic: Secondary | ICD-10-CM | POA: Diagnosis not present

## 2018-05-03 DIAGNOSIS — F331 Major depressive disorder, recurrent, moderate: Secondary | ICD-10-CM | POA: Diagnosis not present

## 2018-05-03 DIAGNOSIS — F411 Generalized anxiety disorder: Secondary | ICD-10-CM | POA: Diagnosis not present

## 2018-05-05 DIAGNOSIS — J45998 Other asthma: Secondary | ICD-10-CM | POA: Diagnosis not present

## 2018-06-02 DIAGNOSIS — F411 Generalized anxiety disorder: Secondary | ICD-10-CM | POA: Diagnosis not present

## 2018-06-02 DIAGNOSIS — F331 Major depressive disorder, recurrent, moderate: Secondary | ICD-10-CM | POA: Diagnosis not present

## 2018-06-05 DIAGNOSIS — J45998 Other asthma: Secondary | ICD-10-CM | POA: Diagnosis not present

## 2018-06-13 ENCOUNTER — Telehealth: Payer: Self-pay

## 2018-06-13 NOTE — Telephone Encounter (Signed)
Spoke to patient. Informed her that Dr Ardis Hughs reviewed her 06/2016 path report. He states she needs a colonoscopy in the [place] with a double prep. She voiced understanding. Appointment made for 07/01/18 at 8am. She will come in for a pre visit to go over instructions 06/14/18.

## 2018-06-14 ENCOUNTER — Ambulatory Visit (AMBULATORY_SURGERY_CENTER): Payer: Self-pay | Admitting: *Deleted

## 2018-06-14 VITALS — Ht 62.0 in | Wt 145.0 lb

## 2018-06-14 DIAGNOSIS — F331 Major depressive disorder, recurrent, moderate: Secondary | ICD-10-CM | POA: Diagnosis not present

## 2018-06-14 DIAGNOSIS — Z86010 Personal history of colonic polyps: Secondary | ICD-10-CM

## 2018-06-14 DIAGNOSIS — F411 Generalized anxiety disorder: Secondary | ICD-10-CM | POA: Diagnosis not present

## 2018-06-14 MED ORDER — PEG 3350-KCL-NA BICARB-NACL 420 G PO SOLR: 4000.0000 mL | Freq: Once | ORAL | 0 refills | Status: AC

## 2018-06-14 NOTE — Progress Notes (Signed)
No egg or soy allergy known to patient  No issues with past sedation with any surgeries  or procedures, no intubation problems  No diet pills per patient No home 02 use per patient  No blood thinners per patient  Pt denies issues with constipation  No A fib or A flutter  EMMI video sent to pt's e mail pt declined   

## 2018-06-15 ENCOUNTER — Telehealth: Payer: Self-pay | Admitting: Neurology

## 2018-06-15 NOTE — Telephone Encounter (Signed)
I called [facility] and requested to know if pre cert was required for codes [phone] and [phone], NPR-Ref#[id number]

## 2018-06-17 ENCOUNTER — Ambulatory Visit (INDEPENDENT_AMBULATORY_CARE_PROVIDER_SITE_OTHER): Payer: PPO | Admitting: Neurology

## 2018-06-17 ENCOUNTER — Encounter: Payer: Self-pay | Admitting: Gastroenterology

## 2018-06-17 DIAGNOSIS — G43709 Chronic migraine without aura, not intractable, without status migrainosus: Secondary | ICD-10-CM

## 2018-06-17 NOTE — Progress Notes (Signed)
Botox- 100 units x 2 vials Lot: [id number] Expiration: 10/2020 NDC: 1779-3903-00  Bacteriostatic 0.9% Sodium Chloride- 30mL total Lot: [id number] Expiration: 02/02/2019 NDC: 7622-6333-54  Dx: T62.563 B/B

## 2018-06-17 NOTE — Progress Notes (Signed)
Consent Form Botulism Toxin Injection For Chronic Migraine   Interval history 06/18/2018: >80% decrease in migraine frequency and severity since starting botox.  +oo, +masseters, +temples   Reviewed orally with patient, additionally signature is on file:   Botulism toxin has been approved by the Federal drug administration for treatment of chronic migraine. Botulism toxin does not cure chronic migraine and it may not be effective in some patients.  The administration of botulism toxin is accomplished by injecting a small amount of toxin into the muscles of the neck and head. Dosage must be titrated for each individual. Any benefits resulting from botulism toxin tend to wear off after 3 months with a repeat injection required if benefit is to be maintained. Injections are usually done every 3-4 months with maximum effect peak achieved by about 2 or 3 weeks. Botulism toxin is expensive and you should be sure of what costs you will incur resulting from the injection.  The side effects of botulism toxin use for chronic migraine may include:   -Transient, and usually mild, facial weakness with facial injections  -Transient, and usually mild, head or neck weakness with head/neck injections  -Reduction or loss of forehead facial animation due to forehead muscle weakness  -Eyelid drooping  -Dry eye  -Pain at the site of injection or bruising at the site of injection  -Double vision  -Potential unknown long term risks  Contraindications: You should not have Botox if you are pregnant, nursing, allergic to albumin, have an infection, skin condition, or muscle weakness at the site of the injection, or have myasthenia gravis, Lambert-Eaton syndrome, or ALS.  It is also possible that as with any injection, there may be an allergic reaction or no effect from the medication. Reduced effectiveness after repeated injections is sometimes seen and rarely infection at the injection site may occur. All care  will be taken to prevent these side effects. If therapy is given over a long time, atrophy and wasting in the muscle injected may occur. Occasionally the patient's become refractory to treatment because they develop antibodies to the toxin. In this event, therapy needs to be modified.  I have read the above information and consent to the administration of botulism toxin.    BOTOX PROCEDURE NOTE FOR MIGRAINE HEADACHE    Contraindications and precautions discussed with patient(above). Aseptic procedure was observed and patient tolerated procedure. Procedure performed by Dr. Georgia Dom  The condition has existed for more than 6 months, and pt does not have a diagnosis of ALS, Myasthenia Gravis or Lambert-Eaton Syndrome.  Risks and benefits of injections discussed and pt agrees to proceed with the procedure.  Written consent obtained  These injections are medically necessary. Pt  receives good benefits from these injections. These injections do not cause sedations or hallucinations which the oral therapies may cause.  Description of procedure:  The patient was placed in a sitting position. The standard protocol was used for Botox as follows, with 5 units of Botox injected at each site:   -Procerus muscle, midline injection  -Corrugator muscle, bilateral injection  -Frontalis muscle, bilateral injection, with 2 sites each side, medial injection was performed in the upper one third of the frontalis muscle, in the region vertical from the medial inferior edge of the superior orbital rim. The lateral injection was again in the upper one third of the forehead vertically above the lateral limbus of the cornea, 1.5 cm lateral to the medial injection site.  -Temporalis muscle injection, 5 sites, bilaterally.  The first injection was 3 cm above the tragus of the ear, second injection site was 1.5 cm to 3 cm up from the first injection site in line with the tragus of the ear. The third injection site was  1.5-3 cm forward between the first 2 injection sites. The fourth injection site was 1.5 cm posterior to the second injection site. 5th site laterally in the temporalis  muscleat the level of the outer canthus.  - Patient feels her clenching is a trigger for headaches. +10 units masseter bilaterally   - Patient feels the migraines are centered around the eyes +5 units bilaterally at the outer canthus in the orbicularis occuli  -Occipitalis muscle injection, 3 sites, bilaterally. The first injection was done one half way between the occipital protuberance and the tip of the mastoid process behind the ear. The second injection site was done lateral and superior to the first, 1 fingerbreadth from the first injection. The third injection site was 1 fingerbreadth superiorly and medially from the first injection site.  -Cervical paraspinal muscle injection, 2 sites, bilateral knee first injection site was 1 cm from the midline of the cervical spine, 3 cm inferior to the lower border of the occipital protuberance. The second injection site was 1.5 cm superiorly and laterally to the first injection site.  -Trapezius muscle injection was performed at 3 sites, bilaterally. The first injection site was in the upper trapezius muscle halfway between the inflection point of the neck, and the acromion. The second injection site was one half way between the acromion and the first injection site. The third injection was done between the first injection site and the inflection point of the neck.   Will return for repeat injection in 3 months.   A 200 unit sof Botox was used, any Botox not injected was wasted. The patient tolerated the procedure well, there were no complications of the above procedure.

## 2018-06-27 ENCOUNTER — Other Ambulatory Visit: Payer: Self-pay | Admitting: Neurology

## 2018-06-28 ENCOUNTER — Other Ambulatory Visit: Payer: Self-pay | Admitting: Neurology

## 2018-06-28 NOTE — Telephone Encounter (Signed)
Checked registry. Last filled Clonazepam 0.5 mg tablet in September 2019 from Dr. Jaynee Eagles. Pt also on Percocet, last filled 04/14/18 #[id number]. Will send to Dr. Jaynee Eagles for approval of Clonazepam.

## 2018-06-29 ENCOUNTER — Telehealth: Payer: Self-pay | Admitting: Neurology

## 2018-06-29 DIAGNOSIS — F411 Generalized anxiety disorder: Secondary | ICD-10-CM | POA: Diagnosis not present

## 2018-06-29 DIAGNOSIS — F331 Major depressive disorder, recurrent, moderate: Secondary | ICD-10-CM | POA: Diagnosis not present

## 2018-06-29 NOTE — Telephone Encounter (Signed)
Please let patient know that I cannot refill her benzodiazepine prescription because she is also on opioids.  The combination can cause respiratory distress and death because both types of drug sedate users and suppress breathing-the cause of overdose fatality-in addition to impairing cognitive functions. A large percent of people who died of an opioid overdose also tested positive for benzodiazepines so our policy is not to prescribe them together. thanks

## 2018-06-30 NOTE — Telephone Encounter (Signed)
I called the patient. She was coming down with the flu so I spoke with her husband [name] (on [place]) who give message to patient. I discussed that we received requests for pt's Clonazepam however Dr. Jaynee Eagles will not be refilling it and we wanted to reach out personally to discuss why. I advised that both the oxycodone & the clonazepam can cause sedation and suppress breathing. There is risk for overdose and fatality with those medications together so we not be providing refills. Advised pt can call her primary care for the request. He verbalized understanding and appreciation for the call.

## 2018-07-01 ENCOUNTER — Encounter: Payer: PPO | Admitting: Gastroenterology

## 2018-07-04 DIAGNOSIS — J45998 Other asthma: Secondary | ICD-10-CM | POA: Diagnosis not present

## 2018-07-11 DIAGNOSIS — F329 Major depressive disorder, single episode, unspecified: Secondary | ICD-10-CM

## 2018-07-11 DIAGNOSIS — F419 Anxiety disorder, unspecified: Principal | ICD-10-CM

## 2018-07-12 MED ORDER — TRAZODONE HCL 50 MG PO TABS: 50.0000 mg | ORAL_TABLET | Freq: Every evening | ORAL | 0 refills | Status: DC | PRN

## 2018-07-19 ENCOUNTER — Other Ambulatory Visit: Payer: Self-pay | Admitting: Primary Care

## 2018-07-19 DIAGNOSIS — F419 Anxiety disorder, unspecified: Principal | ICD-10-CM

## 2018-07-19 DIAGNOSIS — F329 Major depressive disorder, single episode, unspecified: Secondary | ICD-10-CM

## 2018-07-19 DIAGNOSIS — F32A Depression, unspecified: Secondary | ICD-10-CM

## 2018-08-01 DIAGNOSIS — F331 Major depressive disorder, recurrent, moderate: Secondary | ICD-10-CM | POA: Diagnosis not present

## 2018-08-01 DIAGNOSIS — F411 Generalized anxiety disorder: Secondary | ICD-10-CM | POA: Diagnosis not present

## 2018-08-17 DIAGNOSIS — M129 Arthropathy, unspecified: Secondary | ICD-10-CM | POA: Diagnosis not present

## 2018-08-17 DIAGNOSIS — C220 Liver cell carcinoma: Secondary | ICD-10-CM | POA: Diagnosis not present

## 2018-08-17 DIAGNOSIS — M47812 Spondylosis without myelopathy or radiculopathy, cervical region: Secondary | ICD-10-CM | POA: Diagnosis not present

## 2018-08-17 DIAGNOSIS — Z7901 Long term (current) use of anticoagulants: Secondary | ICD-10-CM | POA: Diagnosis not present

## 2018-08-17 DIAGNOSIS — I10 Essential (primary) hypertension: Secondary | ICD-10-CM | POA: Diagnosis not present

## 2018-08-17 DIAGNOSIS — E785 Hyperlipidemia, unspecified: Secondary | ICD-10-CM | POA: Diagnosis not present

## 2018-08-17 DIAGNOSIS — Z79899 Other long term (current) drug therapy: Secondary | ICD-10-CM | POA: Diagnosis not present

## 2018-08-17 DIAGNOSIS — E559 Vitamin D deficiency, unspecified: Secondary | ICD-10-CM | POA: Diagnosis not present

## 2018-08-17 DIAGNOSIS — G894 Chronic pain syndrome: Secondary | ICD-10-CM | POA: Diagnosis not present

## 2018-08-17 DIAGNOSIS — M47816 Spondylosis without myelopathy or radiculopathy, lumbar region: Secondary | ICD-10-CM | POA: Diagnosis not present

## 2018-08-17 DIAGNOSIS — R918 Other nonspecific abnormal finding of lung field: Secondary | ICD-10-CM | POA: Diagnosis not present

## 2018-08-17 DIAGNOSIS — J984 Other disorders of lung: Secondary | ICD-10-CM | POA: Diagnosis not present

## 2018-08-17 DIAGNOSIS — R590 Localized enlarged lymph nodes: Secondary | ICD-10-CM | POA: Diagnosis not present

## 2018-08-17 DIAGNOSIS — I2782 Chronic pulmonary embolism: Secondary | ICD-10-CM | POA: Diagnosis not present

## 2018-08-20 ENCOUNTER — Other Ambulatory Visit: Payer: Self-pay | Admitting: Neurology

## 2018-08-20 DIAGNOSIS — G43709 Chronic migraine without aura, not intractable, without status migrainosus: Secondary | ICD-10-CM

## 2018-08-29 ENCOUNTER — Telehealth: Payer: Self-pay | Admitting: *Deleted

## 2018-08-29 NOTE — Telephone Encounter (Signed)
Received refill request for Aimovig. Pt's PCP refilled this x 1 year on 02/25/18 and it was sent to [facility] [place] on [address]. Message sent back to pharmacy indicating this. Not refilled at this time d/t pt likely having 6 more months left with previous prescriptions. Received a receipt of confirmation.

## 2018-08-30 ENCOUNTER — Other Ambulatory Visit: Payer: Self-pay | Admitting: *Deleted

## 2018-08-30 ENCOUNTER — Telehealth: Payer: Self-pay | Admitting: Neurology

## 2018-08-30 DIAGNOSIS — F411 Generalized anxiety disorder: Secondary | ICD-10-CM | POA: Diagnosis not present

## 2018-08-30 DIAGNOSIS — F331 Major depressive disorder, recurrent, moderate: Secondary | ICD-10-CM | POA: Diagnosis not present

## 2018-08-30 MED ORDER — ERENUMAB-AOOE 140 MG/ML ~~LOC~~ SOAJ: 140.0000 mg | SUBCUTANEOUS | 11 refills | Status: DC

## 2018-08-30 NOTE — Telephone Encounter (Signed)
Pt called stating that she has had bad migraines for the past few days and is needing to get her Erenumab-aooe (AIMOVIG) 55 MG/ML SOAJ soon to relieve the pain. Please advise.

## 2018-08-30 NOTE — Telephone Encounter (Signed)
Spoke with Dr. Jaynee Eagles. Order placed for Aimovig 140 mg every 30 days. Attempted to complete a PA on cover my meds under KEY: [id number]. Received message: Available without authorization. Called pt and let her know. She verbalized appreciation.

## 2018-08-30 NOTE — Telephone Encounter (Signed)
Spoke with pt's pharmacy [facility] [place] on [address]. They never received an updated prescription. I spoke with the patient. She believes she has been on 140 mg once monthly. I advised we would send in a new prescription and likely another PA will be needed. She verbalized appreciation.

## 2018-09-01 ENCOUNTER — Other Ambulatory Visit: Payer: Self-pay | Admitting: Neurology

## 2018-09-13 ENCOUNTER — Telehealth: Payer: Self-pay | Admitting: Neurology

## 2018-09-13 NOTE — Telephone Encounter (Signed)
Pt has called stating she has not heard from [facility] re: her Botox appointment being r/s.  Please call

## 2018-09-13 NOTE — Telephone Encounter (Signed)
Pt was informed that she will be called to r/s her BOTOX appt.

## 2018-09-14 NOTE — Telephone Encounter (Signed)
I called the patient back but she did not answer. I left a VM asking her to return my call.

## 2018-09-15 DIAGNOSIS — M47812 Spondylosis without myelopathy or radiculopathy, cervical region: Secondary | ICD-10-CM | POA: Diagnosis not present

## 2018-09-15 DIAGNOSIS — G894 Chronic pain syndrome: Secondary | ICD-10-CM | POA: Diagnosis not present

## 2018-09-15 DIAGNOSIS — Z79899 Other long term (current) drug therapy: Secondary | ICD-10-CM | POA: Diagnosis not present

## 2018-09-16 ENCOUNTER — Ambulatory Visit: Payer: PPO | Admitting: Neurology

## 2018-10-04 ENCOUNTER — Other Ambulatory Visit: Payer: Self-pay

## 2018-10-04 ENCOUNTER — Ambulatory Visit (INDEPENDENT_AMBULATORY_CARE_PROVIDER_SITE_OTHER): Payer: PPO | Admitting: Neurology

## 2018-10-04 VITALS — Temp 98.0°F

## 2018-10-04 DIAGNOSIS — G43709 Chronic migraine without aura, not intractable, without status migrainosus: Secondary | ICD-10-CM

## 2018-10-04 MED ORDER — KETOROLAC TROMETHAMINE 60 MG/2ML IM SOLN
60.0000 mg | Freq: Once | INTRAMUSCULAR | Status: AC
  Administered 2018-10-04: 60 mg via INTRAMUSCULAR

## 2018-10-04 MED ORDER — UBROGEPANT 50 MG PO TABS: 50.0000 mg | ORAL_TABLET | ORAL | 0 refills | Status: DC | PRN

## 2018-10-04 MED ORDER — METOCLOPRAMIDE HCL 10 MG PO TABS: ORAL_TABLET | ORAL | 3 refills | Status: DC

## 2018-10-04 NOTE — Progress Notes (Signed)
Botox- 100 units x 2 vials Lot: [id number] Expiration: 06/2021 NDC: 8852-0740-97  Bacteriostatic 0.9% Sodium Chloride- 79mL total Lot: [id number] Expiration: 02/02/2019 NDC: 3737-4966-46  Dx: E05.637 B/B  Pt given Toradol 60 mg IM per order. Aseptic technique used. Bandaid applied. See MAR.

## 2018-10-04 NOTE — Progress Notes (Signed)
Consent Form Botulism Toxin Injection For Chronic Migraine   Interval history 10/04/2018: >80% decrease in migraine frequency and severity since starting botox.  +oo, +masseters, +temples. Imitrex not working, migraines are worsening as we are weeks overdue for botox due to covid. Imitrex failue may be due to botox wearing off. Can try Reglan prn as well as samples of Ubrelvy.    Reviewed orally with patient, additionally signature is on file:   Botulism toxin has been approved by the Federal drug administration for treatment of chronic migraine. Botulism toxin does not cure chronic migraine and it may not be effective in some patients.  The administration of botulism toxin is accomplished by injecting a small amount of toxin into the muscles of the neck and head. Dosage must be titrated for each individual. Any benefits resulting from botulism toxin tend to wear off after 3 months with a repeat injection required if benefit is to be maintained. Injections are usually done every 3-4 months with maximum effect peak achieved by about 2 or 3 weeks. Botulism toxin is expensive and you should be sure of what costs you will incur resulting from the injection.  The side effects of botulism toxin use for chronic migraine may include:   -Transient, and usually mild, facial weakness with facial injections  -Transient, and usually mild, head or neck weakness with head/neck injections  -Reduction or loss of forehead facial animation due to forehead muscle weakness  -Eyelid drooping  -Dry eye  -Pain at the site of injection or bruising at the site of injection  -Double vision  -Potential unknown long term risks  Contraindications: You should not have Botox if you are pregnant, nursing, allergic to albumin, have an infection, skin condition, or muscle weakness at the site of the injection, or have myasthenia gravis, Lambert-Eaton syndrome, or ALS.  It is also possible that as with any injection, there  may be an allergic reaction or no effect from the medication. Reduced effectiveness after repeated injections is sometimes seen and rarely infection at the injection site may occur. All care will be taken to prevent these side effects. If therapy is given over a long time, atrophy and wasting in the muscle injected may occur. Occasionally the patient's become refractory to treatment because they develop antibodies to the toxin. In this event, therapy needs to be modified.  I have read the above information and consent to the administration of botulism toxin.    BOTOX PROCEDURE NOTE FOR MIGRAINE HEADACHE    Contraindications and precautions discussed with patient(above). Aseptic procedure was observed and patient tolerated procedure. Procedure performed by Dr. Georgia Dom  The condition has existed for more than 6 months, and pt does not have a diagnosis of ALS, Myasthenia Gravis or Lambert-Eaton Syndrome.  Risks and benefits of injections discussed and pt agrees to proceed with the procedure.  Written consent obtained  These injections are medically necessary. Pt  receives good benefits from these injections. These injections do not cause sedations or hallucinations which the oral therapies may cause.  Description of procedure:  The patient was placed in a sitting position. The standard protocol was used for Botox as follows, with 5 units of Botox injected at each site:   -Procerus muscle, midline injection  -Corrugator muscle, bilateral injection  -Frontalis muscle, bilateral injection, with 2 sites each side, medial injection was performed in the upper one third of the frontalis muscle, in the region vertical from the medial inferior edge of the superior orbital rim.  The lateral injection was again in the upper one third of the forehead vertically above the lateral limbus of the cornea, 1.5 cm lateral to the medial injection site.  -Temporalis muscle injection, 5 sites, bilaterally. The  first injection was 3 cm above the tragus of the ear, second injection site was 1.5 cm to 3 cm up from the first injection site in line with the tragus of the ear. The third injection site was 1.5-3 cm forward between the first 2 injection sites. The fourth injection site was 1.5 cm posterior to the second injection site. 5th site laterally in the temporalis  muscleat the level of the outer canthus.  - Patient feels her clenching is a trigger for headaches. +10 units masseter bilaterally   - Patient feels the migraines are centered around the eyes +5 units bilaterally at the outer canthus in the orbicularis occuli  -Occipitalis muscle injection, 3 sites, bilaterally. The first injection was done one half way between the occipital protuberance and the tip of the mastoid process behind the ear. The second injection site was done lateral and superior to the first, 1 fingerbreadth from the first injection. The third injection site was 1 fingerbreadth superiorly and medially from the first injection site.  -Cervical paraspinal muscle injection, 2 sites, bilateral knee first injection site was 1 cm from the midline of the cervical spine, 3 cm inferior to the lower border of the occipital protuberance. The second injection site was 1.5 cm superiorly and laterally to the first injection site.  -Trapezius muscle injection was performed at 3 sites, bilaterally. The first injection site was in the upper trapezius muscle halfway between the inflection point of the neck, and the acromion. The second injection site was one half way between the acromion and the first injection site. The third injection was done between the first injection site and the inflection point of the neck.   Will return for repeat injection in 3 months.   A 200 unit sof Botox was used, any Botox not injected was wasted. The patient tolerated the procedure well, there were no complications of the above procedure.

## 2018-10-04 NOTE — Patient Instructions (Signed)
[name]: Take 50-125m (1-2 tabs) at onset of migraines. May repeat in 2 hours. Reglan(Metoclopramide): Take 3x a day for 3 days and then as needed up to 3x a day for migraine or nausea.   Metoclopramide tablets What is this medicine? METOCLOPRAMIDE (met oh kloe PRA mide) is used to treat the symptoms of gastroesophageal reflux disease (GERD) like heartburn. It is also used to treat people with slow emptying of the stomach and intestinal tract. This medicine may be used for other purposes; ask your health care provider or pharmacist if you have questions. COMMON BRAND NAME(S): Reglan What should I tell my health care provider before I take this medicine? They need to know if you have any of these conditions: -breast cancer -depression -diabetes -heart failure -high blood pressure -kidney disease -liver disease -Parkinson's disease or a movement disorder -pheochromocytoma -seizures -stomach obstruction, bleeding, or perforation -an unusual or allergic reaction to metoclopramide, procainamide, sulfites, other medicines, foods, dyes, or preservatives -pregnant or trying to get pregnant -breast-feeding How should I use this medicine? Take this medicine by mouth with a glass of water. Follow the directions on the prescription label. Take this medicine on an empty stomach, about 30 minutes before eating. Take your doses at regular intervals. Do not take your medicine more often than directed. Do not stop taking except on the advice of your doctor or health care professional. A special MedGuide will be given to you by the pharmacist with each prescription and refill. Be sure to read this information carefully each time. Talk to your pediatrician regarding the use of this medicine in children. Special care may be needed. Overdosage: If you think you have taken too much of this medicine contact a poison control center or emergency room at once. NOTE: This medicine is only for you. Do not share  this medicine with others. What if I miss a dose? If you miss a dose, take it as soon as you can. If it is almost time for your next dose, take only that dose. Do not take double or extra doses. What may interact with this medicine? -acetaminophen -cyclosporine -digoxin -medicines for blood pressure -medicines for diabetes, including insulin -medicines for hay fever and other allergies -medicines for depression, especially a Monoamine Oxidase Inhibitor (MAOI) -medicines for Parkinson's disease, like levodopa -medicines for sleep or for pain -quinidine -tetracycline This list may not describe all possible interactions. Give your health care provider a list of all the medicines, herbs, non-prescription drugs, or dietary supplements you use. Also tell them if you smoke, drink alcohol, or use illegal drugs. Some items may interact with your medicine. What should I watch for while using this medicine? It may take a few weeks for your stomach condition to start to get better. However, do not take this medicine for longer than 12 weeks. The longer you take this medicine, and the more you take it, the greater your chances are of developing serious side effects. If you are an elderly patient, a female patient, or you have diabetes, you may be at an increased risk for side effects from this medicine. Contact your doctor immediately if you start having movements you cannot control such as lip smacking, rapid movements of the tongue, involuntary or uncontrollable movements of the eyes, head, arms and legs, or muscle twitches and spasms. Patients and their families should watch out for worsening depression or thoughts of suicide. Also watch out for any sudden or severe changes in feelings such as feeling anxious, agitated, panicky,  irritable, hostile, aggressive, impulsive, severely restless, overly excited and hyperactive, or not being able to sleep. If this happens, especially at the beginning of treatment or  after a change in dose, call your doctor. Do not treat yourself for high fever. Ask your doctor or health care professional for advice. You may get drowsy or dizzy. Do not drive, use machinery, or do anything that needs mental alertness until you know how this drug affects you. Do not stand or sit up quickly, especially if you are an older patient. This reduces the risk of dizzy or fainting spells. Alcohol can make you more drowsy and dizzy. Avoid alcoholic drinks. What side effects may I notice from receiving this medicine? Side effects that you should report to your doctor or health care professional as soon as possible: -allergic reactions like skin rash, itching or hives, swelling of the face, lips, or tongue -abnormal production of milk in females -breast enlargement in both males and females -change in the way you walk -difficulty moving, speaking or swallowing -drooling, lip smacking, or rapid movements of the tongue -excessive sweating -fever -involuntary or uncontrollable movements of the eyes, head, arms and legs -irregular heartbeat or palpitations -muscle twitches and spasms -unusually weak or tired Side effects that usually do not require medical attention (report to your doctor or health care professional if they continue or are bothersome): -change in sex drive or performance -depressed mood -diarrhea -difficulty sleeping -headache -menstrual changes -restless or nervous This list may not describe all possible side effects. Call your doctor for medical advice about side effects. You may report side effects to FDA at 1-800-FDA-1088. Where should I keep my medicine? Keep out of the reach of children. Store at room temperature between 20 and 25 degrees C (68 and 77 degrees F). Protect from light. Keep container tightly closed. Throw away any unused medicine after the expiration date. NOTE: This sheet is a summary. It may not cover all possible information. If you have  questions about this medicine, talk to your doctor, pharmacist, or health care provider.  2019 Elsevier/Gold Standard (2016-02-05 15:13:45)  [employer]: Patient drug information Access [employer] Online here. Copyright [phone] [employer] All rights reserved. (For additional information see "Ubrogepant: Drug information") Brand Names: [place]  [name]  What is this drug used for?   It is used to treat migraine headaches.  What do I need to tell my doctor BEFORE I take this drug?   If you are allergic to this drug; any part of this drug; or any other drugs, foods, or substances. Tell your doctor about the allergy and what signs you had.   If you have kidney disease.   If you take any drugs (prescription or OTC, natural products, vitamins) that must not be taken with this drug, like certain drugs that are used for HIV, infections, or seizures. There are many drugs that must not be taken with this drug.   This is not a list of all drugs or health problems that interact with this drug.   Tell your doctor and pharmacist about all of your drugs (prescription or OTC, natural products, vitamins) and health problems. You must check to make sure that it is safe for you to take this drug with all of your drugs and health problems. Do not start, stop, or change the dose of any drug without checking with your doctor.  What are some things I need to know or do while I take this drug?   Tell all of  your health care providers that you take this drug. This includes your doctors, nurses, pharmacists, and dentists.   If you drink grapefruit juice or eat grapefruit often, talk with your doctor.   Tell your doctor if you are pregnant, plan on getting pregnant, or are breast-feeding. You will need to talk about the benefits and risks to you and the baby.  What are some side effects that I need to call my doctor about right away?   WARNING/CAUTION: Even though it may be rare, some people may have very bad and  sometimes deadly side effects when taking a drug. Tell your doctor or get medical help right away if you have any of the following signs or symptoms that may be related to a very bad side effect:   Signs of an allergic reaction, like rash; hives; itching; red, swollen, blistered, or peeling skin with or without fever; wheezing; tightness in the chest or throat; trouble breathing, swallowing, or talking; unusual hoarseness; or swelling of the mouth, face, lips, tongue, or throat.  What are some other side effects of this drug?   All drugs may cause side effects. However, many people have no side effects or only have minor side effects. Call your doctor or get medical help if any of these side effects or any other side effects bother you or do not go away:   Upset stomach.   Feeling sleepy.   These are not all of the side effects that may occur. If you have questions about side effects, call your doctor. Call your doctor for medical advice about side effects.   You may report side effects to your national health agency.  How is this drug best taken?   Use this drug as ordered by your doctor. Read all information given to you. Follow all instructions closely.   Take with or without food.   Take as early as you can after the attack has started.   If needed, another dose may be taken as your doctor has told you. Be sure you know how many hours to wait before taking another dose.  What do I do if I miss a dose?   This drug is taken on an as needed basis. Do not take more often than told by the doctor.  How do I store and/or throw out this drug?   Store at room temperature in a dry place. Do not store in a bathroom.   Keep all drugs in a safe place. Keep all drugs out of the reach of children and pets.   Throw away unused or expired drugs. Do not flush down a toilet or pour down a drain unless you are told to do so. Check with your pharmacist if you have questions about the best way to throw out  drugs. There may be drug take-back programs in your area.  General drug facts   If your symptoms or health problems do not get better or if they become worse, call your doctor.   Do not share your drugs with others and do not take anyone else's drugs.   Some drugs may have another patient information leaflet. If you have any questions about this drug, please talk with your doctor, nurse, pharmacist, or other health care provider.   If you think there has been an overdose, call your poison control center or get medical care right away. Be ready to tell or show what was taken, how much, and when it happened.  Use of UpToDate  is subject to the Subscription and License Agreement. Topic [phone] Version 3.0 Close Lex[employer]omp drug information & Lex[employer] are subject to the [employer] License Agreement.

## 2018-10-12 DIAGNOSIS — M47812 Spondylosis without myelopathy or radiculopathy, cervical region: Secondary | ICD-10-CM | POA: Diagnosis not present

## 2018-10-12 DIAGNOSIS — Z79899 Other long term (current) drug therapy: Secondary | ICD-10-CM | POA: Diagnosis not present

## 2018-10-12 DIAGNOSIS — G894 Chronic pain syndrome: Secondary | ICD-10-CM | POA: Diagnosis not present

## 2018-10-18 DIAGNOSIS — F3341 Major depressive disorder, recurrent, in partial remission: Secondary | ICD-10-CM | POA: Diagnosis not present

## 2018-10-18 DIAGNOSIS — F411 Generalized anxiety disorder: Secondary | ICD-10-CM | POA: Diagnosis not present

## 2018-10-19 ENCOUNTER — Telehealth: Payer: Self-pay | Admitting: Neurology

## 2018-10-19 ENCOUNTER — Other Ambulatory Visit: Payer: Self-pay | Admitting: *Deleted

## 2018-10-19 DIAGNOSIS — F329 Major depressive disorder, single episode, unspecified: Secondary | ICD-10-CM

## 2018-10-19 DIAGNOSIS — F32A Depression, unspecified: Secondary | ICD-10-CM

## 2018-10-19 MED ORDER — TRAZODONE HCL 50 MG PO TABS: 50.0000 mg | ORAL_TABLET | Freq: Every evening | ORAL | 1 refills | Status: DC | PRN

## 2018-10-19 NOTE — Telephone Encounter (Signed)
Patient left another message stating that she is only going to be in town for two more days and requested that a refill be sent to the pharmacy for her Trazodone.

## 2018-10-19 NOTE — Telephone Encounter (Signed)
Pt has called to inform that she has given the metoCLOPramide (REGLAN) 10 MG tablet about 2 weeks and it has not helped.  Pt is asking to try something else

## 2018-10-19 NOTE — Telephone Encounter (Signed)
Noted, refill sent to pharmacy. 

## 2018-10-19 NOTE — Telephone Encounter (Signed)
[name], if she still has a migraine 2 weeks later have her talk to amy over a video or phone and I can give amy some suggestions for her. thanks

## 2018-10-19 NOTE — Telephone Encounter (Signed)
Jessica at [facility] left a message on voicemail stating that patient needs a new script sent to them for Trazodone 50 mg 1-2 tablets at bedtime. Last office visit 02/25/18 Last refill 07/12/18 #[id number]

## 2018-10-20 NOTE — Telephone Encounter (Signed)
Do you have time to call and tell her I will call her in something called Zyprexa for her to try please? Zyprexa 5mg  1-2 pills for headache, max 4 pills in one day, 2 days in one week. I will email her

## 2018-10-20 NOTE — Telephone Encounter (Signed)
Never mond, I emailed her thanks

## 2018-10-25 ENCOUNTER — Other Ambulatory Visit: Payer: Self-pay | Admitting: Neurology

## 2018-10-25 MED ORDER — OLANZAPINE 2.5 MG PO TABS: ORAL_TABLET | ORAL | 3 refills | Status: DC

## 2018-11-16 ENCOUNTER — Other Ambulatory Visit: Payer: Self-pay | Admitting: Neurology

## 2018-12-07 DIAGNOSIS — D2272 Melanocytic nevi of left lower limb, including hip: Secondary | ICD-10-CM | POA: Diagnosis not present

## 2018-12-07 DIAGNOSIS — D2261 Melanocytic nevi of right upper limb, including shoulder: Secondary | ICD-10-CM | POA: Diagnosis not present

## 2018-12-07 DIAGNOSIS — D1801 Hemangioma of skin and subcutaneous tissue: Secondary | ICD-10-CM | POA: Diagnosis not present

## 2018-12-07 DIAGNOSIS — D485 Neoplasm of uncertain behavior of skin: Secondary | ICD-10-CM | POA: Diagnosis not present

## 2018-12-07 DIAGNOSIS — L814 Other melanin hyperpigmentation: Secondary | ICD-10-CM | POA: Diagnosis not present

## 2018-12-07 DIAGNOSIS — L820 Inflamed seborrheic keratosis: Secondary | ICD-10-CM | POA: Diagnosis not present

## 2018-12-07 DIAGNOSIS — Z85828 Personal history of other malignant neoplasm of skin: Secondary | ICD-10-CM | POA: Diagnosis not present

## 2018-12-07 DIAGNOSIS — D2262 Melanocytic nevi of left upper limb, including shoulder: Secondary | ICD-10-CM | POA: Diagnosis not present

## 2018-12-07 DIAGNOSIS — L821 Other seborrheic keratosis: Secondary | ICD-10-CM | POA: Diagnosis not present

## 2018-12-07 DIAGNOSIS — L570 Actinic keratosis: Secondary | ICD-10-CM | POA: Diagnosis not present

## 2018-12-07 DIAGNOSIS — D2271 Melanocytic nevi of right lower limb, including hip: Secondary | ICD-10-CM | POA: Diagnosis not present

## 2018-12-07 DIAGNOSIS — D225 Melanocytic nevi of trunk: Secondary | ICD-10-CM | POA: Diagnosis not present

## 2018-12-14 DIAGNOSIS — G894 Chronic pain syndrome: Secondary | ICD-10-CM | POA: Diagnosis not present

## 2018-12-14 DIAGNOSIS — Z760 Encounter for issue of repeat prescription: Secondary | ICD-10-CM | POA: Diagnosis not present

## 2018-12-14 DIAGNOSIS — M47812 Spondylosis without myelopathy or radiculopathy, cervical region: Secondary | ICD-10-CM | POA: Diagnosis not present

## 2018-12-14 DIAGNOSIS — Z79899 Other long term (current) drug therapy: Secondary | ICD-10-CM | POA: Diagnosis not present

## 2018-12-28 ENCOUNTER — Telehealth: Payer: Self-pay | Admitting: Neurology

## 2018-12-28 NOTE — Telephone Encounter (Signed)
I called and spoke with the patients insurance to verify pre cert requirements for codes. The representative stated [facility] Ref#[id number] R. 12/28/18. DW

## 2019-01-03 ENCOUNTER — Ambulatory Visit (INDEPENDENT_AMBULATORY_CARE_PROVIDER_SITE_OTHER): Payer: PPO | Admitting: Neurology

## 2019-01-03 ENCOUNTER — Other Ambulatory Visit: Payer: Self-pay

## 2019-01-03 VITALS — Temp 98.1°F

## 2019-01-03 DIAGNOSIS — G43709 Chronic migraine without aura, not intractable, without status migrainosus: Secondary | ICD-10-CM

## 2019-01-03 DIAGNOSIS — U071 COVID-19: Secondary | ICD-10-CM

## 2019-01-03 DIAGNOSIS — J988 Other specified respiratory disorders: Secondary | ICD-10-CM | POA: Diagnosis not present

## 2019-01-03 NOTE — Progress Notes (Signed)
Botox- 100 units x 2 vials Lot: [id number] Expiration: 07/2021 NDC: [id number]  Bacteriostatic 0.9% Sodium Chloride- 35mL total Lot: [id number] Expiration: 02/02/2019 NDC: [id number]  Dx: [id number] B/B

## 2019-01-03 NOTE — Progress Notes (Signed)
Consent Form Botulism Toxin Injection For Chronic Migraine   Interval history 01/03/2019: She has been feeling great the last 3 months, this last time was great, : >80% decrease in migraine frequency and severity since starting botox.  +oo, +masseters, +temples. Imitrex not working, migraines are worsening as we are weeks overdue for botox due to covid. Imitrex failue may be due to botox wearing off. Can try Reglan prn and also give samples of nurtec   Reviewed orally with patient, additionally signature is on file:   Botulism toxin has been approved by the Federal drug administration for treatment of chronic migraine. Botulism toxin does not cure chronic migraine and it may not be effective in some patients.  The administration of botulism toxin is accomplished by injecting a small amount of toxin into the muscles of the neck and head. Dosage must be titrated for each individual. Any benefits resulting from botulism toxin tend to wear off after 3 months with a repeat injection required if benefit is to be maintained. Injections are usually done every 3-4 months with maximum effect peak achieved by about 2 or 3 weeks. Botulism toxin is expensive and you should be sure of what costs you will incur resulting from the injection.  The side effects of botulism toxin use for chronic migraine may include:   -Transient, and usually mild, facial weakness with facial injections  -Transient, and usually mild, head or neck weakness with head/neck injections  -Reduction or loss of forehead facial animation due to forehead muscle weakness  -Eyelid drooping  -Dry eye  -Pain at the site of injection or bruising at the site of injection  -Double vision  -Potential unknown long term risks  Contraindications: You should not have Botox if you are pregnant, nursing, allergic to albumin, have an infection, skin condition, or muscle weakness at the site of the injection, or have myasthenia gravis, Lambert-Eaton  syndrome, or ALS.  It is also possible that as with any injection, there may be an allergic reaction or no effect from the medication. Reduced effectiveness after repeated injections is sometimes seen and rarely infection at the injection site may occur. All care will be taken to prevent these side effects. If therapy is given over a long time, atrophy and wasting in the muscle injected may occur. Occasionally the patient's become refractory to treatment because they develop antibodies to the toxin. In this event, therapy needs to be modified.  I have read the above information and consent to the administration of botulism toxin.    BOTOX PROCEDURE NOTE FOR MIGRAINE HEADACHE    Contraindications and precautions discussed with patient(above). Aseptic procedure was observed and patient tolerated procedure. Procedure performed by Dr. Georgia Dom  The condition has existed for more than 6 months, and pt does not have a diagnosis of ALS, Myasthenia Gravis or Lambert-Eaton Syndrome.  Risks and benefits of injections discussed and pt agrees to proceed with the procedure.  Written consent obtained  These injections are medically necessary. Pt  receives good benefits from these injections. These injections do not cause sedations or hallucinations which the oral therapies may cause.  Description of procedure:  The patient was placed in a sitting position. The standard protocol was used for Botox as follows, with 5 units of Botox injected at each site:   -Procerus muscle, midline injection  -Corrugator muscle, bilateral injection  -Frontalis muscle, bilateral injection, with 2 sites each side, medial injection was performed in the upper one third of the frontalis muscle,  in the region vertical from the medial inferior edge of the superior orbital rim. The lateral injection was again in the upper one third of the forehead vertically above the lateral limbus of the cornea, 1.5 cm lateral to the medial  injection site.  -Temporalis muscle injection, 5 sites, bilaterally. The first injection was 3 cm above the tragus of the ear, second injection site was 1.5 cm to 3 cm up from the first injection site in line with the tragus of the ear. The third injection site was 1.5-3 cm forward between the first 2 injection sites. The fourth injection site was 1.5 cm posterior to the second injection site. 5th site laterally in the temporalis  muscleat the level of the outer canthus.  - Patient feels her clenching is a trigger for headaches. +10 units masseter bilaterally   - Patient feels the migraines are centered around the eyes +5 units bilaterally at the outer canthus in the orbicularis occuli  -Occipitalis muscle injection, 3 sites, bilaterally. The first injection was done one half way between the occipital protuberance and the tip of the mastoid process behind the ear. The second injection site was done lateral and superior to the first, 1 fingerbreadth from the first injection. The third injection site was 1 fingerbreadth superiorly and medially from the first injection site.  -Cervical paraspinal muscle injection, 2 sites, bilateral knee first injection site was 1 cm from the midline of the cervical spine, 3 cm inferior to the lower border of the occipital protuberance. The second injection site was 1.5 cm superiorly and laterally to the first injection site.  -Trapezius muscle injection was performed at 3 sites, bilaterally. The first injection site was in the upper trapezius muscle halfway between the inflection point of the neck, and the acromion. The second injection site was one half way between the acromion and the first injection site. The third injection was done between the first injection site and the inflection point of the neck.   Will return for repeat injection in 3 months.   A 200 unit sof Botox was used, any Botox not injected was wasted. The patient tolerated the procedure well, there  were no complications of the above procedure.

## 2019-01-03 NOTE — Patient Instructions (Signed)
Once daily as needed  [employer]: Patient drug information Access [employer] Online here. Copyright (947)[phone] [employer] All rights reserved. (For additional information see "[employer]: Drug information") Brand Names: [place]  Nurtec  What is this drug used for?   It is used to treat migraine headaches.  What do I need to tell my doctor BEFORE I take this drug?   If you are allergic to this drug; any part of this drug; or any other drugs, foods, or substances. Tell your doctor about the allergy and what signs you had.   If you have any of these health problems: Kidney disease or liver disease.   If you take any drugs (prescription or OTC, natural products, vitamins) that must not be taken with this drug, like certain drugs that are used for HIV, infections, or seizures. There are many drugs that must not be taken with this drug.   This is not a list of all drugs or health problems that interact with this drug.   Tell your doctor and pharmacist about all of your drugs (prescription or OTC, natural products, vitamins) and health problems. You must check to make sure that it is safe for you to take this drug with all of your drugs and health problems. Do not start, stop, or change the dose of any drug without checking with your doctor.  What are some things I need to know or do while I take this drug?   Tell all of your health care providers that you take this drug. This includes your doctors, nurses, pharmacists, and dentists.   This drug is not meant to prevent or lower the number of migraine headaches you get.   Tell your doctor if you are pregnant, plan on getting pregnant, or are breast-feeding. You will need to talk about the benefits and risks to you and the baby.  What are some side effects that I need to call my doctor about right away?   WARNING/CAUTION: Even though it may be rare, some people may have very bad and sometimes deadly side effects when taking a drug. Tell your doctor or  get medical help right away if you have any of the following signs or symptoms that may be related to a very bad side effect:   Signs of an allergic reaction, like rash; hives; itching; red, swollen, blistered, or peeling skin with or without fever; wheezing; tightness in the chest or throat; trouble breathing, swallowing, or talking; unusual hoarseness; or swelling of the mouth, face, lips, tongue, or throat.  What are some other side effects of this drug?   All drugs may cause side effects. However, many people have no side effects or only have minor side effects. Call your doctor or get medical help if any of these side effects or any other side effects bother you or do not go away:   Upset stomach.   These are not all of the side effects that may occur. If you have questions about side effects, call your doctor. Call your doctor for medical advice about side effects.   You may report side effects to your national health agency.  How is this drug best taken?   Use this drug as ordered by your doctor. Read all information given to you. Follow all instructions closely.   Do not push the tablet out of the foil when opening. Use dry hands to take it from the foil. Place on your tongue and let it dissolve. Water is not needed. Do  not swallow it whole. Do not chew, break, or crush it.   If needed, you may place the tablet under the tongue.   Use right after opening.  What do I do if I miss a dose?   This drug is taken on an as needed basis. Do not take more often than told by the doctor.  How do I store and/or throw out this drug?   Store at room temperature in a dry place. Do not store in a bathroom.   Store in foil pouch until ready for use.   Keep all drugs in a safe place. Keep all drugs out of the reach of children and pets.   Throw away unused or expired drugs. Do not flush down a toilet or pour down a drain unless you are told to do so. Check with your pharmacist if you have questions about  the best way to throw out drugs. There may be drug take-back programs in your area.  General drug facts   If your symptoms or health problems do not get better or if they become worse, call your doctor.   Do not share your drugs with others and do not take anyone else's drugs.   Some drugs may have another patient information leaflet. If you have any questions about this drug, please talk with your doctor, nurse, pharmacist, or other health care provider.   If you think there has been an overdose, call your poison control center or get medical care right away. Be ready to tell or show what was taken, how much, and when it happened.

## 2019-01-04 LAB — SAR COV2 SEROLOGY (COVID19)AB(IGG),IA: SARS-CoV-2 Ab, IgG: NEGATIVE

## 2019-02-06 ENCOUNTER — Other Ambulatory Visit: Payer: Self-pay | Admitting: Neurology

## 2019-02-06 MED ORDER — HYDROXYZINE PAMOATE 25 MG PO CAPS: ORAL_CAPSULE | ORAL | 0 refills | Status: DC

## 2019-02-06 NOTE — Telephone Encounter (Signed)
Patient left a voicemail stating that she really needs this done today and requested a call back.

## 2019-02-07 NOTE — Telephone Encounter (Signed)
[name], please make sure we have these updated. Thanks.

## 2019-02-08 ENCOUNTER — Ambulatory Visit (INDEPENDENT_AMBULATORY_CARE_PROVIDER_SITE_OTHER): Payer: PPO | Admitting: Primary Care

## 2019-02-08 ENCOUNTER — Encounter: Payer: Self-pay | Admitting: Primary Care

## 2019-02-08 VITALS — Wt 145.0 lb

## 2019-02-08 DIAGNOSIS — F329 Major depressive disorder, single episode, unspecified: Secondary | ICD-10-CM | POA: Diagnosis not present

## 2019-02-08 DIAGNOSIS — F419 Anxiety disorder, unspecified: Secondary | ICD-10-CM

## 2019-02-08 MED ORDER — HYDROXYZINE PAMOATE 25 MG PO CAPS: ORAL_CAPSULE | ORAL | 0 refills | Status: DC

## 2019-02-08 NOTE — Patient Instructions (Signed)
I sent 30 extra capsules of hydroxyzine to your pharmacy.  Follow up with your psychiatrist as scheduled.  It was a pleasure to see you today! Allie Bossier, NP-C

## 2019-02-08 NOTE — Assessment & Plan Note (Signed)
Improved with resuming hydroxyzine. Extra 30 capsules provided today. Follow up with psychiatry as scheduled.

## 2019-02-08 NOTE — Progress Notes (Signed)
Subjective:    Patient ID: [name], female    DOB: January 08, 1951, 68 y.o.   MRN: [id number]  HPI  Virtual Visit via Video Note  I connected with [name] on 02/08/19 at 10:20 AM EDT by a video enabled telemedicine application and verified that I am speaking with the correct person using two identifiers.  Location: Patient: Home Provider: Office   I discussed the limitations of evaluation and management by telemedicine and the availability of in person appointments. The patient expressed understanding and agreed to proceed.  History of Present Illness:  [name] is a 68 year old female who presents today for medication refill.  She called our office two days ago requesting refills of her hydroxyzine as she had been out for several days. She noticed increased anxiety with tearfulness without her medication. She also contacted her neurologist who provided 30 capsules of her hydroxyzine before we could get to her message.  BP Readings from Last 3 Encounters:  02/25/18 108/70  08/31/17 116/85  06/24/17 112/70   Today she is doing better. She picked up 30 capsules of hydroxyzine but will run out as she takes this twice daily. She is requesting another 30 capsules to get her through. She is established with psychiatry and has a follow up visit scheduled for next week.    Observations/Objective:  Alert and oriented. Appears well, not sickly. No distress. Speaking in complete sentences.   Assessment and Plan:  Improved with resuming hydroxyzine. Extra 30 capsules provided today. Follow up with psychiatry as scheduled.   Follow Up Instructions:  I sent 30 extra capsules of hydroxyzine to your pharmacy.  Follow up with your psychiatrist as scheduled.  It was a pleasure to see you today! Jo Bossier, NP-C     I discussed the assessment and treatment plan with the patient. The patient was provided an opportunity to ask questions and all were answered. The  patient agreed with the plan and demonstrated an understanding of the instructions.   The patient was advised to call back or seek an in-person evaluation if the symptoms worsen or if the condition fails to improve as anticipated.    Jo Koch, NP    Review of Systems  Psychiatric/Behavioral:       See HPI       Past Medical History:  Diagnosis Date  . 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 Anxiety   . Asthma   . CAP (community acquired pneumonia)   . Cataract    removed both eyes  . COLONIC POLYPS, HX OF 11/15/2009  . DEPRESSION 11/14/2009  . GASTROJEJ ULCR UNS ACUT/CHRN W/O HEMOR PERF/OBST 11/06/2009  . GERD (gastroesophageal reflux disease)   . Headache(784.0) 11/15/2009  . Heart murmur   . HEART MURMUR, HX OF 11/15/2009  . HIATAL HERNIA 10/08/2009  . HIP FRACTURE, LEFT 11/14/2009  . HYPERLIPIDEMIA 11/14/2009  . HYPERTENSION 11/14/2009  . OSTEOPOROSIS 11/14/2009  . WEIGHT GAIN 01/10/2010     Social History   Socioeconomic History  . Marital status: Married    Spouse name: [name]  . Number of children: 3  . Years of education: BA  . Highest education level: Not on file  Occupational History  . Occupation: IT consultant and [place]  . Financial resource strain: Not on file  . Food insecurity    Worry: Not on file    Inability: Not on file  . Transportation needs    Medical: Not on file  Non-medical: Not on file  Tobacco Use  . Smoking status: Former Smoker    Quit date: 05/04/1982    Years since quitting: 36.7  . Smokeless tobacco: Never Used  . 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 Alcohol use: Yes    Comment: occ wine   . Drug use: No  . Sexual activity: Not on file  Lifestyle  . Physical activity    Days per week: Not on file    Minutes per session: Not on file  . Stress: Not on file  Relationships   . Social Herbalist on phone: Not on file    Gets together: Not on file    Attends religious service: Not on file    Active member of club or organization: Not on file    Attends meetings of clubs or organizations: Not on file    Relationship status: Not on file  . Intimate partner violence    Fear of current or ex partner: Not on file    Emotionally abused: Not on file    Physically abused: Not on file    Forced sexual activity: Not on file  Other Topics Concern  . Not on file  Social History Narrative   Lives with husband   Caffeine use: 1 cup or less    Right handed    Past Surgical History:  Procedure Laterality Date  . APPENDECTOMY    . AUGMENTATION MAMMAPLASTY Bilateral   . BACK SURGERY    . Bleeding ulcers  10/2009  . Broken Hip  06/2001  . CATARACT EXTRACTION Bilateral 01/2017  . COLONOSCOPY N/A 12/30/2016   Procedure: COLONOSCOPY WITH PROPOFOL;  Surgeon: Leighton Ruff, MD;  Location: [facility];  Service: Endoscopy;  Laterality: N/A;  . COLONOSCOPY    . NECK SURGERY  04 & 06   x's 2  . POLYPECTOMY    . RECTAL PROLAPSE REPAIR  01/2017    Family History  Problem Relation Age of Onset  . Bone cancer Mother   . Hypertension Other   . Allergic rhinitis Neg Hx   . Asthma Neg Hx   . Urticaria Neg Hx   . Colon cancer Neg Hx   . Colon polyps Neg Hx   . Esophageal cancer Neg Hx   . Rectal cancer Neg Hx   . Stomach cancer Neg Hx     Allergies  Allergen Reactions  . Amoxicillin-Pot Clavulanate Nausea Only    Current Outpatient Medications on File Prior to Visit  Medication Sig Dispense Refill  . CALCIUM PO Take 1 tablet by mouth daily.     . clonazePAM (KLONOPIN) 1 MG tablet Take 1 mg by mouth 2 (two) times daily.    Eduard Roux (AIMOVIG) 140 MG/ML SOAJ Inject 140 mg into the skin every 30 (thirty) days. 1 pen 11  . ibandronate (BONIVA) 150 MG tablet Take 1 tablet (150 mg total) by mouth every 30 (thirty) days. 3 tablet 3  . lisinopril  (PRINIVIL,ZESTRIL) 20 MG tablet Take 1 tablet (20 mg total) by mouth daily. For blood pressure. 90 tablet 3  . metoCLOPramide (REGLAN) 10 MG tablet Take 3x a day for 3 days and as needed up to 3x a day afterwards for migraine and/or nausea. 90 tablet 3  . Multiple Vitamins-Minerals (MULTIVITAMIN PO) Take 1 tablet by mouth daily.     Marland Kitchen  ondansetron (ZOFRAN) 4 MG tablet TAKE 1 TABLET BY MOUTH EVERY 8 HOURS AS NEEDED FOR NAUSEA AND VOMITING 90 tablet 6  . oxyCODONE-acetaminophen (PERCOCET/ROXICET) 5-325 MG tablet Take 1 tablet by mouth 2 (two) times daily.     . SUMAtriptan (IMITREX) 100 MG tablet TAKE 1 TABLET BY MOUTH ONCE. MAY REPEAT IN 2 HOURS. MAX 2 TABLETS IN 1 DAY, 10 TABLETS IN 1 MONTH. 10 tablet 9  . traZODone (DESYREL) 50 MG tablet Take 1-2 tablets (50-100 mg total) by mouth at bedtime as needed for sleep. (Patient taking differently: Take 50 mg by mouth at bedtime as needed for sleep. ) 180 tablet 1  . VIIBRYD 20 MG TABS Take 20 mg by mouth daily.    Marland Kitchen albuterol (PROVENTIL) (2.5 MG/3ML) 0.083% nebulizer solution Take 3 mLs (2.5 mg total) by nebulization every 6 (six) hours as needed for wheezing or shortness of breath. (Patient not taking: Reported on 06/14/2018) 75 mL 0  . OLANZapine (ZYPREXA) 2.5 MG tablet TAKE 1 TAB EVERY HOUR UNTIL HEADACHE IMPROVES. MAXIMUM 4 TABS/24 HRS, NO MORE THAN 2 DAYS A WEEK. (Patient not taking: Reported on 01/03/2019) 60 tablet 2  . polyethylene glycol powder (MIRALAX) powder Take 1 Container by mouth once. 119 grams for a 2 day colon prep for 2-28    . pravastatin (PRAVACHOL) 40 MG tablet Take 1 tablet (40 mg total) by mouth daily. For cholesterol. (Patient not taking: Reported on 01/03/2019) 90 tablet 3  . Ubrogepant (UBRELVY) 50 MG TABS Take 50 mg by mouth every 2 (two) hours as needed. Max 200mg  a day (Patient not taking: Reported on 01/03/2019) 4 tablet 0   No current facility-administered medications on file prior to visit.     Wt 145 lb (65.8 kg)   BMI 26.52  kg/m    Objective:   Physical Exam  Constitutional: She is oriented to person, place, and time. She appears well-nourished.  Respiratory: Effort normal.  Neurological: She is alert and oriented to person, place, and time.  Psychiatric: She has a normal mood and affect.           Assessment & Plan:

## 2019-02-13 DIAGNOSIS — F411 Generalized anxiety disorder: Secondary | ICD-10-CM | POA: Diagnosis not present

## 2019-02-13 DIAGNOSIS — F3342 Major depressive disorder, recurrent, in full remission: Secondary | ICD-10-CM | POA: Diagnosis not present

## 2019-02-16 ENCOUNTER — Other Ambulatory Visit: Payer: Self-pay | Admitting: Neurology

## 2019-02-21 ENCOUNTER — Telehealth: Payer: Self-pay | Admitting: *Deleted

## 2019-02-21 ENCOUNTER — Other Ambulatory Visit: Payer: Self-pay

## 2019-02-21 ENCOUNTER — Ambulatory Visit (INDEPENDENT_AMBULATORY_CARE_PROVIDER_SITE_OTHER): Payer: Self-pay | Admitting: *Deleted

## 2019-02-21 DIAGNOSIS — Z760 Encounter for issue of repeat prescription: Secondary | ICD-10-CM

## 2019-02-21 DIAGNOSIS — Z0289 Encounter for other administrative examinations: Secondary | ICD-10-CM

## 2019-02-21 MED ORDER — AIMOVIG 140 MG/ML ~~LOC~~ SOAJ: 140.0000 mg | SUBCUTANEOUS | 0 refills | Status: DC

## 2019-02-21 NOTE — Telephone Encounter (Signed)
Spoke with pt and advised Dr. Jaynee Eagles has 3 months of samples of Aimovig here for pickup. Pt verbalized appreciation. She will plan to come today after 2 pm. Sample order placed and printed med list for pt.

## 2019-02-21 NOTE — Progress Notes (Signed)
Patient arrived and was given Aimovig 140 mg samples x 3 pens per v.o. Dr. Jaynee Eagles. Patient verbalized appreciation. She was also given a printed med/allergy list.   Clarification of LOT numbers of the Aimovig: [id number] EXPIRATION: 03/2020

## 2019-02-23 ENCOUNTER — Other Ambulatory Visit: Payer: Self-pay | Admitting: Primary Care

## 2019-02-23 DIAGNOSIS — I10 Essential (primary) hypertension: Secondary | ICD-10-CM

## 2019-02-23 DIAGNOSIS — E785 Hyperlipidemia, unspecified: Secondary | ICD-10-CM

## 2019-02-27 ENCOUNTER — Other Ambulatory Visit: Payer: Self-pay | Admitting: Internal Medicine

## 2019-02-27 DIAGNOSIS — Z1231 Encounter for screening mammogram for malignant neoplasm of breast: Secondary | ICD-10-CM

## 2019-03-08 DIAGNOSIS — M47816 Spondylosis without myelopathy or radiculopathy, lumbar region: Secondary | ICD-10-CM | POA: Diagnosis not present

## 2019-03-08 DIAGNOSIS — G894 Chronic pain syndrome: Secondary | ICD-10-CM | POA: Diagnosis not present

## 2019-03-08 DIAGNOSIS — Z79899 Other long term (current) drug therapy: Secondary | ICD-10-CM | POA: Diagnosis not present

## 2019-03-14 ENCOUNTER — Other Ambulatory Visit: Payer: Self-pay | Admitting: Primary Care

## 2019-03-14 DIAGNOSIS — Z1159 Encounter for screening for other viral diseases: Secondary | ICD-10-CM

## 2019-03-14 DIAGNOSIS — E785 Hyperlipidemia, unspecified: Secondary | ICD-10-CM

## 2019-03-14 DIAGNOSIS — D509 Iron deficiency anemia, unspecified: Secondary | ICD-10-CM

## 2019-03-14 DIAGNOSIS — E559 Vitamin D deficiency, unspecified: Secondary | ICD-10-CM

## 2019-03-14 DIAGNOSIS — I10 Essential (primary) hypertension: Secondary | ICD-10-CM

## 2019-03-16 ENCOUNTER — Other Ambulatory Visit: Payer: Self-pay

## 2019-03-16 ENCOUNTER — Ambulatory Visit (INDEPENDENT_AMBULATORY_CARE_PROVIDER_SITE_OTHER): Payer: PPO

## 2019-03-16 DIAGNOSIS — Z0000 Encounter for general adult medical examination without abnormal findings: Secondary | ICD-10-CM | POA: Diagnosis not present

## 2019-03-16 NOTE — Progress Notes (Signed)
PCP notes:  Health Maintenance: none   Abnormal Screenings: none   Patient concerns:  none  Nurse concerns:  none  Next PCP appt.: 03/21/2019 @ 2:20 pm

## 2019-03-16 NOTE — Progress Notes (Signed)
Subjective:   [name] is a 68 y.o. female who presents for Medicare Annual (Subsequent) preventive examination.  Review of Systems: N/A   This visit is being conducted through telemedicine via telephone at the nurse health advisor's home address due to the COVID-19 pandemic. This patient has given me verbal consent via doximity to conduct this visit, patient states they are participating from their home address. Patient and myself are on the telephone call. There is no referral for this visit. Some vital signs may be absent or patient reported.    Patient identification: identified by name, DOB, and current address   Cardiac Risk Factors include: advanced age (>16men, >46 women);hypertension;dyslipidemia     Objective:     Vitals: There were no vitals taken for this visit.  There is no height or weight on file to calculate BMI.  Advanced Directives 03/16/2019 05/27/2017 01/01/2017 12/30/2016 12/23/2016 12/18/2016  Does Patient Have a Medical Advance Directive? Yes Yes Yes Yes Yes Yes  Type of Paramedic of [place];Living will [place];Living will [place];Living will [place];Living will [place];Living will Living will;Healthcare Power of Attorney  Does patient want to make changes to medical advance directive? - - No - Patient declined - No - Patient declined No - Patient declined  Copy of [place] in Chart? 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 (stress)   Clinical Intake:  Pre-visit preparation completed: Yes  Pain : No/denies pain     Nutritional Risks: None Diabetes: No  How often do you need to have someone help you when you read instructions, pamphlets, or other written materials from your doctor or pharmacy?: 1 - Never What is the last grade level you completed in school?: college  Interpreter Needed?: No  Information entered by :: CJohnson, LPN  Past Medical History:  Diagnosis Date   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   Anxiety    Asthma    CAP (community acquired pneumonia)    Cataract    removed both eyes   COLONIC POLYPS, HX OF 11/15/2009   DEPRESSION 11/14/2009   [place] ACUT/CHRN W/O HEMOR PERF/OBST 11/06/2009   GERD (gastroesophageal reflux disease)    Headache(784.0) 11/15/2009   Heart murmur    HEART MURMUR, HX OF 11/15/2009   HIATAL HERNIA 10/08/2009   HIP FRACTURE, LEFT 11/14/2009   HYPERLIPIDEMIA 11/14/2009   HYPERTENSION 11/14/2009   OSTEOPOROSIS 11/14/2009   WEIGHT GAIN 01/10/2010   Past Surgical History:  Procedure Laterality Date   APPENDECTOMY     AUGMENTATION MAMMAPLASTY Bilateral    BACK SURGERY     Bleeding ulcers  10/2009   Broken Hip  06/2001   CATARACT EXTRACTION Bilateral 01/2017   COLONOSCOPY N/A 12/30/2016   Procedure: COLONOSCOPY WITH PROPOFOL;  Surgeon: Leighton Ruff, MD;  Location: [facility];  Service: Endoscopy;  Laterality: N/A;  COLONOSCOPY     NECK SURGERY  04 & 06   x's 2   POLYPECTOMY     RECTAL PROLAPSE REPAIR  01/2017   Family History  Problem Relation Age of Onset   Bone cancer Mother    Hypertension Other    Allergic rhinitis Neg Hx    Asthma Neg Hx    Urticaria Neg Hx    Colon cancer Neg Hx    Colon polyps Neg Hx    Esophageal cancer Neg Hx    Rectal cancer Neg Hx     Stomach cancer Neg Hx    Social History   Socioeconomic History   Marital status: Married    Spouse name: [name]   Number of children: 3   Years of education: BA   Highest education level: Not on file  Occupational History   Occupation: IT consultant and Spa  Social Designer, fashion/clothing strain: Not hard at all   Food insecurity    Worry: Never true    Inability: Never true   Transportation needs    Medical: No    Non-medical: No  Tobacco Use   Smoking status: Former Smoker    Quit date: 05/04/1982    Years since quitting: 36.8   Smokeless tobacco: Never Used   Tobacco comment: Married, 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[place], 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 (stress)  Substance and Sexual Activity   Alcohol use: Yes    Comment: occ wine    Drug use: No   Sexual activity: Not on file  Lifestyle   Physical activity    Days per week: 0 days    Minutes per session: 0 min   Stress: To some extent  Relationships   Social connections    Talks on phone: Not on file    Gets together: Not on file    Attends religious service: Not on file    Active member of club or organization: Not on file    Attends meetings of clubs or organizations: Not on file    Relationship status: Not on file  Other Topics Concern   Not on file  Social History Narrative   Lives with husband   Caffeine use: 1 cup or less    Right handed    Outpatient Encounter Medications as of 03/16/2019  Medication Sig   albuterol (PROVENTIL) (2.5 MG/3ML) 0.083% nebulizer solution Take 3 mLs (2.5 mg total) by nebulization every 6 (six) hours as needed for wheezing or shortness of breath.   CALCIUM PO Take 1 tablet by mouth daily.    clonazePAM (KLONOPIN) 1 MG tablet Take 1 mg by mouth 2 (two) times daily.   Erenumab-aooe (AIMOVIG) 140 MG/ML SOAJ Inject 140 mg into the skin every 30 (thirty) days.   Erenumab-aooe (AIMOVIG) 140 MG/ML SOAJ Inject  140 mg into the skin every 30 (thirty) days.   hydrOXYzine (VISTARIL) 25 MG capsule TAKE 1 CAPSULE BY MOUTH TWICE DAILY AS NEEDED FOR ANXIETY   ibandronate (BONIVA) 150 MG tablet Take 1 tablet (150 mg total) by mouth every 30 (thirty) days.   lisinopril (ZESTRIL) 20 MG tablet TAKE ONE TABLET BY MOUTH DAILY FOR BLOOD PRESSURE   metoCLOPramide (REGLAN) 10 MG tablet TAKE 1 TABLET BY MOUTH THREE TIMES A DAY X 3 DAYS AND AS NEEDED UP TO 3 TIMES A DAY AFTERWARDS FOR MIGRANE / NAUSEA.   Multiple Vitamins-Minerals (MULTIVITAMIN PO) Take 1 tablet by mouth daily.  OLANZapine (ZYPREXA) 2.5 MG tablet TAKE 1 TAB EVERY HOUR UNTIL HEADACHE IMPROVES. MAXIMUM 4 TABS/24 HRS, NO MORE THAN 2 DAYS A WEEK.   ondansetron (ZOFRAN) 4 MG tablet TAKE 1 TABLET BY MOUTH EVERY 8 HOURS AS NEEDED FOR NAUSEA AND VOMITING   oxyCODONE-acetaminophen (PERCOCET/ROXICET) 5-325 MG tablet Take 1 tablet by mouth 2 (two) times daily.    polyethylene glycol powder (MIRALAX) powder Take 1 Container by mouth once. 119 grams for a 2 day colon prep for 2-28   pravastatin (PRAVACHOL) 40 MG tablet TAKE 1 TABLET BY MOUTH EVERY DAY FOR CHOLESTEROL   SUMAtriptan (IMITREX) 100 MG tablet TAKE 1 TABLET BY MOUTH ONCE. MAY REPEAT IN 2 HOURS. MAX 2 TABLETS IN 1 DAY, 10 TABLETS IN 1 MONTH.   traZODone (DESYREL) 50 MG tablet Take 1-2 tablets (50-100 mg total) by mouth at bedtime as needed for sleep. (Patient taking differently: Take 50 mg by mouth at bedtime as needed for sleep. )   Ubrogepant (UBRELVY) 50 MG TABS Take 50 mg by mouth every 2 (two) hours as needed. Max 200mg  a day   VIIBRYD 20 MG TABS Take 20 mg by mouth daily.   No facility-administered encounter medications on file as of 03/16/2019.     Activities of Daily Living In your present state of health, do you have any difficulty performing the following activities: 03/16/2019  Hearing? N  Vision? N  Difficulty concentrating or making decisions? N  Walking or climbing stairs?  N  Dressing or bathing? N  Doing errands, shopping? N  Preparing Food and eating ? N  Using the Toilet? N  In the past six months, have you accidently leaked urine? N  Do you have problems with loss of bowel control? N  Managing your Medications? N  Managing your Finances? N  Housekeeping or managing your Housekeeping? N  Some recent data might be hidden    Patient Care Team: Pleas Koch, NP as PCP - General (Internal Medicine)    Assessment:   This is a routine wellness examination for [name].  Exercise Activities and Dietary recommendations Current Exercise Habits: Home exercise routine, Type of exercise: treadmill, Time (Minutes): 60, Frequency (Times/Week): 7, Weekly Exercise (Minutes/Week): 420, Intensity: Moderate, Exercise limited by: None identified  Goals     Patient Stated     03/16/2019, I will maintain and continue medications as prescribed.        Fall Risk Fall Risk  03/16/2019  Falls in the past year? 0  Number falls in past yr: 0  Injury with Fall? 0  Risk for fall due to : Medication side effect  Follow up Falls evaluation completed;Falls prevention discussed   Is the patient's home free of loose throw rugs in walkways, pet beds, electrical cords, etc?   yes      Grab bars in the bathroom? yes      Handrails on the stairs?   yes      Adequate lighting?   yes  Timed Get Up and Go performed: N/A  Depression Screen PHQ 2/9 Scores 03/16/2019 01/06/2017 12/02/2016  PHQ - 2 Score 0 0 0  PHQ- 9 Score 0 - -     Cognitive Function MMSE - Mini Mental State Exam 03/16/2019  Orientation to time 5  Orientation to Place 5  Registration 3  Attention/ Calculation 5  Recall 3  Language- repeat 1       Mini Cog  Mini-Cog screen was completed. Maximum score is 22. A value  of 0 denotes this part of the MMSE was not completed or the patient failed this part of the Mini-Cog screening.  Immunization History  Administered Date(s) Administered    Influenza, High Dose Seasonal PF 01/13/2017   Influenza,inj,Quad PF,6+ Mos 12/07/2018   Influenza-Unspecified 02/03/2018, 01/01/2019   Pneumococcal Conjugate-13 12/20/2015   Pneumococcal Polysaccharide-23 01/02/2017   Tdap 08/29/2012   Zoster 02/28/2013   Zoster Recombinat (Shingrix) 04/18/2017    Qualifies for Shingles Vaccine? Completed series   Screening Tests Health Maintenance  Topic Date Due   Hepatitis C Screening  18-May-1950   MAMMOGRAM  01/13/2020   TETANUS/TDAP  08/30/2022   COLONOSCOPY  12/31/2026   INFLUENZA VACCINE  Completed   DEXA SCAN  Completed   PNA vac Low Risk Adult  Completed    Cancer Screenings: Lung: Low Dose CT Chest recommended if Age 72-80 years, 30 pack-year currently smoking OR have quit w/in 15years. Patient does not qualify. Breast:  Up to date on Mammogram? Yes, scheduled 04/18/2019   Up to date of Bone Density/Dexa? Yes, completed 12/22/2016 Colorectal: completed 12/22/2016  Additional Screenings:  Hepatitis C Screening: not completed     Plan:    Patient will maintain and continue medications as prescribed.    I have personally reviewed and noted the following in the patients chart:    Medical and social history  Use of alcohol, tobacco or illicit drugs   Current medications and supplements  Functional ability and status  Nutritional status  Physical activity  Advanced directives  List of other physicians  Hospitalizations, surgeries, and ER visits in previous 12 months  Vitals  Screenings to include cognitive, depression, and falls  Referrals and appointments  In addition, I have reviewed and discussed with patient certain preventive protocols, quality metrics, and best practice recommendations. A written personalized care plan for preventive services as well as general preventive health recommendations were provided to patient.     Andrez Grime, LPN  [phone]

## 2019-03-16 NOTE — Patient Instructions (Signed)
[name] , Thank you for taking time to come for your Medicare Wellness Visit. I appreciate your ongoing commitment to your health goals. Please review the following plan we discussed and let me know if I can assist you in the future.   Screening recommendations/referrals: Colonoscopy: Up to date, completed 12/30/2016 Mammogram: scheduled 04/18/2019 Bone Density: Up to date, completed 12/22/2016 Recommended yearly ophthalmology/optometry visit for glaucoma screening and checkup Recommended yearly dental visit for hygiene and checkup  Vaccinations: Influenza vaccine: Up to date, completed 01/01/2019 Pneumococcal vaccine: Completed series Tdap vaccine: Up to date, completed 08/29/2012 Shingles vaccine: Completed series    Advanced directives: Please bring a copy of your POA (Power of [place]) and/or Living Will to your next appointment.    Conditions/risks identified: hypertension, hyperlipidemia  Next appointment: 03/21/2019 @ 2:20 pm    Preventive Care 65 Years and Older, Female Preventive care refers to lifestyle choices and visits with your health care provider that can promote health and wellness. What does preventive care include?  A yearly physical exam. This is also called an annual well check.  Dental exams once or twice a year.  Routine eye exams. Ask your health care provider how often you should have your eyes checked.  Personal lifestyle choices, including:  Daily care of your teeth and gums.  Regular physical activity.  Eating a healthy diet.  Avoiding tobacco and drug use.  Limiting alcohol use.  Practicing safe sex.  Taking low-dose aspirin every day.  Taking vitamin and mineral supplements as recommended by your health care provider. What happens during an annual well check? The services and screenings done by your health care provider during your annual well check will depend on your age, overall health, lifestyle risk factors, and family history  of disease. Counseling  Your health care provider may ask you questions about your:  Alcohol use.  Tobacco use.  Drug use.  Emotional well-being.  Home and relationship well-being.  Sexual activity.  Eating habits.  History of falls.  Memory and ability to understand (cognition).  Work and work Statistician.  Reproductive health. Screening  You may have the following tests or measurements:  Height, weight, and BMI.  Blood pressure.  Lipid and cholesterol levels. These may be checked every 5 years, or more frequently if you are over 84 years old.  Skin check.  Lung cancer screening. You may have this screening every year starting at age 57 if you have a 30-pack-year history of smoking and currently smoke or have quit within the past 15 years.  Fecal occult blood test (FOBT) of the stool. You may have this test every year starting at age 71.  Flexible sigmoidoscopy or colonoscopy. You may have a sigmoidoscopy every 5 years or a colonoscopy every 10 years starting at age 76.  Hepatitis C blood test.  Hepatitis B blood test.  Sexually transmitted disease (STD) testing.  Diabetes screening. This is done by checking your blood sugar (glucose) after you have not eaten for a while (fasting). You may have this done every 1-3 years.  Bone density scan. This is done to screen for osteoporosis. You may have this done starting at age 29.  Mammogram. This may be done every 1-2 years. Talk to your health care provider about how often you should have regular mammograms. Talk with your health care provider about your test results, treatment options, and if necessary, the need for more tests. Vaccines  Your health care provider may recommend certain vaccines, such as:  Influenza vaccine. This is recommended every year.  Tetanus, diphtheria, and acellular pertussis (Tdap, Td) vaccine. You may need a Td booster every 10 years.  Zoster vaccine. You may need this after age 41.   Pneumococcal 13-valent conjugate (PCV13) vaccine. One dose is recommended after age 67.  Pneumococcal polysaccharide (PPSV23) vaccine. One dose is recommended after age 54. Talk to your health care provider about which screenings and vaccines you need and how often you need them. This information is not intended to replace advice given to you by your health care provider. Make sure you discuss any questions you have with your health care provider. Document Released: 05/17/2015 Document Revised: 01/08/2016 Document Reviewed: 02/19/2015 Elsevier Interactive Patient Education  2017 [place] Prevention in the Home Falls can cause injuries. They can happen to people of all ages. There are many things you can do to make your home safe and to help prevent falls. What can I do on the outside of my home?  Regularly fix the edges of walkways and driveways and fix any cracks.  Remove anything that might make you trip as you walk through a door, such as a raised step or threshold.  Trim any bushes or trees on the path to your home.  Use bright outdoor lighting.  Clear any walking paths of anything that might make someone trip, such as rocks or tools.  Regularly check to see if handrails are loose or broken. Make sure that both sides of any steps have handrails.  Any raised decks and porches should have guardrails on the edges.  Have any leaves, snow, or ice cleared regularly.  Use sand or salt on walking paths during winter.  Clean up any spills in your garage right away. This includes oil or grease spills. What can I do in the bathroom?  Use night lights.  Install grab bars by the toilet and in the tub and shower. Do not use towel bars as grab bars.  Use non-skid mats or decals in the tub or shower.  If you need to sit down in the shower, use a plastic, non-slip stool.  Keep the floor dry. Clean up any water that spills on the floor as soon as it happens.  Remove soap  buildup in the tub or shower regularly.  Attach bath mats securely with double-sided non-slip rug tape.  Do not have throw rugs and other things on the floor that can make you trip. What can I do in the bedroom?  Use night lights.  Make sure that you have a light by your bed that is easy to reach.  Do not use any sheets or blankets that are too big for your bed. They should not hang down onto the floor.  Have a firm chair that has side arms. You can use this for support while you get dressed.  Do not have throw rugs and other things on the floor that can make you trip. What can I do in the kitchen?  Clean up any spills right away.  Avoid walking on wet floors.  Keep items that you use a lot in easy-to-reach places.  If you need to reach something above you, use a strong step stool that has a grab bar.  Keep electrical cords out of the way.  Do not use floor polish or wax that makes floors slippery. If you must use wax, use non-skid floor wax.  Do not have throw rugs and other things on the floor that  can make you trip. What can I do with my stairs?  Do not leave any items on the stairs.  Make sure that there are handrails on both sides of the stairs and use them. Fix handrails that are broken or loose. Make sure that handrails are as long as the stairways.  Check any carpeting to make sure that it is firmly attached to the stairs. Fix any carpet that is loose or worn.  Avoid having throw rugs at the top or bottom of the stairs. If you do have throw rugs, attach them to the floor with carpet tape.  Make sure that you have a light switch at the top of the stairs and the bottom of the stairs. If you do not have them, ask someone to add them for you. What else can I do to help prevent falls?  Wear shoes that:  Do not have high heels.  Have rubber bottoms.  Are comfortable and fit you well.  Are closed at the toe. Do not wear sandals.  If you use a stepladder:  Make  sure that it is fully opened. Do not climb a closed stepladder.  Make sure that both sides of the stepladder are locked into place.  Ask someone to hold it for you, if possible.  Clearly mark and make sure that you can see:  Any grab bars or handrails.  First and last steps.  Where the edge of each step is.  Use tools that help you move around (mobility aids) if they are needed. These include:  Canes.  Walkers.  Scooters.  Crutches.  Turn on the lights when you go into a dark area. Replace any light bulbs as soon as they burn out.  Set up your furniture so you have a clear path. Avoid moving your furniture around.  If any of your floors are uneven, fix them.  If there are any pets around you, be aware of where they are.  Review your medicines with your doctor. Some medicines can make you feel dizzy. This can increase your chance of falling. Ask your doctor what other things that you can do to help prevent falls. This information is not intended to replace advice given to you by your health care provider. Make sure you discuss any questions you have with your health care provider. Document Released: 02/14/2009 Document Revised: 09/26/2015 Document Reviewed: 05/25/2014 Elsevier Interactive Patient Education  2017 [employer].

## 2019-03-17 ENCOUNTER — Other Ambulatory Visit (INDEPENDENT_AMBULATORY_CARE_PROVIDER_SITE_OTHER): Payer: PPO

## 2019-03-17 DIAGNOSIS — E559 Vitamin D deficiency, unspecified: Secondary | ICD-10-CM

## 2019-03-17 DIAGNOSIS — I10 Essential (primary) hypertension: Secondary | ICD-10-CM | POA: Diagnosis not present

## 2019-03-17 DIAGNOSIS — D509 Iron deficiency anemia, unspecified: Secondary | ICD-10-CM

## 2019-03-17 DIAGNOSIS — E785 Hyperlipidemia, unspecified: Secondary | ICD-10-CM

## 2019-03-17 DIAGNOSIS — Z1159 Encounter for screening for other viral diseases: Secondary | ICD-10-CM

## 2019-03-17 LAB — COMPREHENSIVE METABOLIC PANEL
ALT: 17 U/L (ref 0–35)
AST: 18 U/L (ref 0–37)
Albumin: 4.4 g/dL (ref 3.5–5.2)
Alkaline Phosphatase: 54 U/L (ref 39–117)
BUN: 19 mg/dL (ref 6–23)
CO2: 32 mEq/L (ref 19–32)
Calcium: 10.6 mg/dL — ABNORMAL HIGH (ref 8.4–10.5)
Chloride: 101 mEq/L (ref 96–112)
Creatinine, Ser: 1.18 mg/dL (ref 0.40–1.20)
GFR: 45.35 mL/min — ABNORMAL LOW (ref >60.00)
Glucose, Bld: 106 mg/dL — ABNORMAL HIGH (ref 70–99)
Potassium: 4.2 mEq/L (ref 3.5–5.1)
Sodium: 142 mEq/L (ref 135–145)
Total Bilirubin: 0.5 mg/dL (ref 0.2–1.2)
Total Protein: 7 g/dL (ref 6.0–8.3)

## 2019-03-17 LAB — LIPID PANEL
Cholesterol: 189 mg/dL (ref 0–200)
HDL: 42.5 mg/dL (ref >39.00)
LDL Cholesterol: 115 mg/dL — ABNORMAL HIGH (ref 0–99)
NonHDL: 146.68
Total CHOL/HDL Ratio: 4
Triglycerides: 157 mg/dL — ABNORMAL HIGH (ref 0.0–149.0)
VLDL: 31.4 mg/dL (ref 0.0–40.0)

## 2019-03-17 LAB — CBC
HCT: 37.2 % (ref 36.0–46.0)
Hemoglobin: 12.1 g/dL (ref 12.0–15.0)
MCHC: 32.7 g/dL (ref 30.0–36.0)
MCV: 94.1 fl (ref 78.0–100.0)
Platelets: 249 10*3/uL (ref 150.0–400.0)
RBC: 3.95 Mil/uL (ref 3.87–5.11)
RDW: 14.4 % (ref 11.5–15.5)
WBC: 9.8 10*3/uL (ref 4.0–10.5)

## 2019-03-17 LAB — HEMOGLOBIN A1C: Hgb A1c MFr Bld: 5.8 % (ref 4.6–6.5)

## 2019-03-17 LAB — VITAMIN D 25 HYDROXY (VIT D DEFICIENCY, FRACTURES): VITD: 44.35 ng/mL (ref 30.00–100.00)

## 2019-03-20 LAB — HEPATITIS C ANTIBODY
Hepatitis C Ab: NONREACTIVE
SIGNAL TO CUT-OFF: 0 (ref <1.00)

## 2019-03-21 ENCOUNTER — Ambulatory Visit (INDEPENDENT_AMBULATORY_CARE_PROVIDER_SITE_OTHER): Payer: PPO | Admitting: Primary Care

## 2019-03-21 ENCOUNTER — Other Ambulatory Visit: Payer: Self-pay

## 2019-03-21 ENCOUNTER — Encounter: Payer: Self-pay | Admitting: Primary Care

## 2019-03-21 VITALS — BP 116/72 | HR 90 | Temp 98.0°F | Ht 62.0 in | Wt 161.8 lb

## 2019-03-21 DIAGNOSIS — N289 Disorder of kidney and ureter, unspecified: Secondary | ICD-10-CM

## 2019-03-21 DIAGNOSIS — G8921 Chronic pain due to trauma: Secondary | ICD-10-CM | POA: Diagnosis not present

## 2019-03-21 DIAGNOSIS — F329 Major depressive disorder, single episode, unspecified: Secondary | ICD-10-CM

## 2019-03-21 DIAGNOSIS — G43709 Chronic migraine without aura, not intractable, without status migrainosus: Secondary | ICD-10-CM | POA: Diagnosis not present

## 2019-03-21 DIAGNOSIS — Z86010 Personal history of colonic polyps: Secondary | ICD-10-CM | POA: Diagnosis not present

## 2019-03-21 DIAGNOSIS — I10 Essential (primary) hypertension: Secondary | ICD-10-CM

## 2019-03-21 DIAGNOSIS — M810 Age-related osteoporosis without current pathological fracture: Secondary | ICD-10-CM

## 2019-03-21 DIAGNOSIS — Z0000 Encounter for general adult medical examination without abnormal findings: Secondary | ICD-10-CM

## 2019-03-21 DIAGNOSIS — J4520 Mild intermittent asthma, uncomplicated: Secondary | ICD-10-CM

## 2019-03-21 DIAGNOSIS — M19072 Primary osteoarthritis, left ankle and foot: Secondary | ICD-10-CM

## 2019-03-21 DIAGNOSIS — E2839 Other primary ovarian failure: Secondary | ICD-10-CM | POA: Diagnosis not present

## 2019-03-21 DIAGNOSIS — E785 Hyperlipidemia, unspecified: Secondary | ICD-10-CM | POA: Diagnosis not present

## 2019-03-21 DIAGNOSIS — F419 Anxiety disorder, unspecified: Secondary | ICD-10-CM

## 2019-03-21 MED ORDER — ALBUTEROL SULFATE (2.5 MG/3ML) 0.083% IN NEBU: 2.5000 mg | INHALATION_SOLUTION | Freq: Four times a day (QID) | RESPIRATORY_TRACT | 0 refills | Status: DC | PRN

## 2019-03-21 NOTE — Assessment & Plan Note (Signed)
Stable, infrequent use of albuterol nebulizer. Refill provided.

## 2019-03-21 NOTE — Assessment & Plan Note (Signed)
Due for repeat colonoscopy, she will call to inquire.

## 2019-03-21 NOTE — Progress Notes (Signed)
Subjective:    Patient ID: [name], female    DOB: [birth year], 68 y.o.   MRN: [id number]  HPI  [name] is a 68 year old female who presents today for complete physical.  Lower extremity shaking to the left lower extremity for the last one year. This only occurs when resting only. She denies numbness/tingling, symptoms when walking, tripping, weakness. She is managed on olanzapine and viibryd. She has not mentioned these symptoms to her neurologist.    The 10-year ASCVD risk score Mikey Bussing DC Brooke Bonito., et al., 2013) is: 9.8%   Values used to calculate the score:     Age: 72 years     Sex: Female     Is Non-Hispanic African American: No     Diabetic: No     Tobacco smoker: No     Systolic Blood Pressure: 99991111 mmHg     Is BP treated: Yes     HDL Cholesterol: 42.5 mg/dL     Total Cholesterol: 189 mg/dL   Immunizations: -Tetanus: Completed in 2014 -Influenza: Completed this season  -Shingles: Completed Zostavax in 2014, Shingrix in 2018 -Pneumonia: Completed Prevnar and Pneumovax  Diet: She endorses a healthy diet, decrease in appetite.  Exercise: She is walking for one hour weekly for 6 days weekly.  Eye exam: No recent exam Dental exam: Completes semi-annually  Mammogram: Scheduled for December 2020 Dexa: Completed in 2018 Colonoscopy: Completed in 2018, was scheduled for 2020 but did not go due to Covid-19.  Hep C Screen: Negative  BP Readings from Last 3 Encounters:  03/21/19 116/72  02/25/18 108/70  08/31/17 116/85   Wt Readings from Last 3 Encounters:  03/21/19 161 lb 12 oz (73.4 kg)  02/08/19 145 lb (65.8 kg)  06/14/18 145 lb (65.8 kg)     Review of Systems  Constitutional: Negative for unexpected weight change.  HENT: Negative for rhinorrhea.   Respiratory: Negative for cough and shortness of breath.   Cardiovascular: Negative for chest pain.  Gastrointestinal: Positive for constipation and diarrhea.       Chronic GI symptoms  Genitourinary:  Negative for difficulty urinating.  Musculoskeletal: Positive for arthralgias.  Skin: Negative for rash.  Allergic/Immunologic: Negative for environmental allergies.  Neurological: Positive for headaches. Negative for dizziness and numbness.       Shaking of left lower extremity  Psychiatric/Behavioral:       Overall feels well managed per psychiatry        Past Medical History:  Diagnosis Date  . 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 Anxiety   . Asthma   . CAP (community acquired pneumonia)   . Cataract    removed both eyes  . COLONIC POLYPS, HX OF 11/15/2009  . DEPRESSION 11/14/2009  . GASTROJEJ ULCR UNS ACUT/CHRN W/O HEMOR PERF/OBST 11/06/2009  . GERD (gastroesophageal reflux disease)   . Headache(784.0) 11/15/2009  . Heart murmur   . HEART MURMUR, HX OF 11/15/2009  . HIATAL HERNIA 10/08/2009  . HIP FRACTURE, LEFT 11/14/2009  . HYPERLIPIDEMIA 11/14/2009  . HYPERTENSION 11/14/2009  . OSTEOPOROSIS 11/14/2009  . WEIGHT GAIN 01/10/2010     Social History   Socioeconomic History  . Marital status: Married    Spouse name: [name]  . Number of children: 3  . Years of education: BA  . Highest education level: Not on file  Occupational History  . Occupation: IT consultant and [employer]  . Financial resource strain: Not hard at all  . Food insecurity  Worry: Never true    Inability: Never true  . Transportation needs    Medical: No    Non-medical: No  Tobacco Use  . Smoking status: Former Smoker    Quit date: 05/04/1982    Years since quitting: 36.9  . Smokeless tobacco: Never Used  . 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 Alcohol use: Yes    Comment: occ wine   . Drug use: No  . Sexual activity: Not on file  Lifestyle  . Physical activity    Days per week: 0 days    Minutes per session: 0 min  . Stress: To some extent   Relationships  . Social Herbalist on phone: Not on file    Gets together: Not on file    Attends religious service: Not on file    Active member of club or organization: Not on file    Attends meetings of clubs or organizations: Not on file    Relationship status: Not on file  . Intimate partner violence    Fear of current or ex partner: No    Emotionally abused: No    Physically abused: No    Forced sexual activity: No  Other Topics Concern  . Not on file  Social History Narrative   Lives with husband   Caffeine use: 1 cup or less    Right handed    Past Surgical History:  Procedure Laterality Date  . APPENDECTOMY    . AUGMENTATION MAMMAPLASTY Bilateral   . BACK SURGERY    . Bleeding ulcers  10/2009  . Broken Hip  06/2001  . CATARACT EXTRACTION Bilateral 01/2017  . COLONOSCOPY N/A 12/30/2016   Procedure: COLONOSCOPY WITH PROPOFOL;  Surgeon: Leighton Ruff, MD;  Location: [facility];  Service: Endoscopy;  Laterality: N/A;  . COLONOSCOPY    . NECK SURGERY  04 & 06   x's 2  . POLYPECTOMY    . RECTAL PROLAPSE REPAIR  01/2017    Family History  Problem Relation Age of Onset  . Bone cancer Mother   . Hypertension Other   . Allergic rhinitis Neg Hx   . Asthma Neg Hx   . Urticaria Neg Hx   . Colon cancer Neg Hx   . Colon polyps Neg Hx   . Esophageal cancer Neg Hx   . Rectal cancer Neg Hx   . Stomach cancer Neg Hx     Allergies  Allergen Reactions  . Amoxicillin-Pot Clavulanate Nausea Only    Current Outpatient Medications on File Prior to Visit  Medication Sig Dispense Refill  . CALCIUM PO Take 1 tablet by mouth daily.     . clonazePAM (KLONOPIN) 1 MG tablet Take 1 mg by mouth 2 (two) times daily.    . hydrOXYzine (VISTARIL) 25 MG capsule TAKE 1 CAPSULE BY MOUTH TWICE DAILY AS NEEDED FOR ANXIETY 30 capsule 0  . ibandronate (BONIVA) 150 MG tablet Take 1 tablet (150 mg total) by mouth every 30 (thirty) days. 3 tablet 3  . lisinopril (ZESTRIL) 20 MG  tablet TAKE ONE TABLET BY MOUTH DAILY FOR BLOOD PRESSURE 90 tablet 0  . metoCLOPramide (REGLAN) 10 MG tablet TAKE 1 TABLET BY MOUTH THREE TIMES A DAY X 3 DAYS AND AS NEEDED UP TO 3 TIMES A DAY AFTERWARDS FOR MIGRANE / NAUSEA. 90 tablet 3  . Multiple  Vitamins-Minerals (MULTIVITAMIN PO) Take 1 tablet by mouth daily.     Marland Kitchen OLANZapine (ZYPREXA) 2.5 MG tablet TAKE 1 TAB EVERY HOUR UNTIL HEADACHE IMPROVES. MAXIMUM 4 TABS/24 HRS, NO MORE THAN 2 DAYS A WEEK. 60 tablet 2  . ondansetron (ZOFRAN) 4 MG tablet TAKE 1 TABLET BY MOUTH EVERY 8 HOURS AS NEEDED FOR NAUSEA AND VOMITING 90 tablet 6  . oxyCODONE-acetaminophen (PERCOCET/ROXICET) 5-325 MG tablet Take 1 tablet by mouth 2 (two) times daily.     . pravastatin (PRAVACHOL) 40 MG tablet TAKE 1 TABLET BY MOUTH EVERY DAY FOR CHOLESTEROL 90 tablet 0  . SUMAtriptan (IMITREX) 100 MG tablet TAKE 1 TABLET BY MOUTH ONCE. MAY REPEAT IN 2 HOURS. MAX 2 TABLETS IN 1 DAY, 10 TABLETS IN 1 MONTH. 10 tablet 9  . traZODone (DESYREL) 50 MG tablet Take 1-2 tablets (50-100 mg total) by mouth at bedtime as needed for sleep. (Patient taking differently: Take 50 mg by mouth at bedtime as needed for sleep. ) 180 tablet 1  . VIIBRYD 20 MG TABS Take 20 mg by mouth daily.     No current facility-administered medications on file prior to visit.     BP 116/72   Pulse 90   Temp 98 F (36.7 C) (Temporal)   Ht 5\' 2"  (1.575 m)   Wt 161 lb 12 oz (73.4 kg)   SpO2 98%   BMI 29.58 kg/m    Objective:   Physical Exam  Constitutional: She is oriented to person, place, and time. She appears well-nourished.  HENT:  Right Ear: Tympanic membrane and ear canal normal.  Left Ear: Tympanic membrane and ear canal normal.  Mouth/Throat: Oropharynx is clear and moist.  Eyes: Pupils are equal, round, and reactive to light. EOM are normal.  Neck: Neck supple.  Cardiovascular: Normal rate and regular rhythm.  Respiratory: Effort normal and breath sounds normal.  GI: Soft. Bowel sounds are  normal. There is no abdominal tenderness.  Musculoskeletal: Normal range of motion.  Neurological: She is alert and oriented to person, place, and time.  Reflex Scores:      Patellar reflexes are 2+ on the right side and 2+ on the left side. Intermittent shaking of left lower extremity while resting. No weakness or difficulty ambulating.   Skin: Skin is warm and dry.  Psychiatric: She has a normal mood and affect.           Assessment & Plan:

## 2019-03-21 NOTE — Assessment & Plan Note (Signed)
Chronic, following with pain management

## 2019-03-21 NOTE — Assessment & Plan Note (Addendum)
Following with psychiatry and is now on [place]. She would like to return to Cymbalta and will notify her psychiatrist.   Also discussed use of Zyprexa which is provided by her neurologist. She will double check her medications at home to see if she's actually taking.  Also suspect left leg shaking is more of a tic than tremor. I recommended she bring this up with both her psychiatrist and neurologist.

## 2019-03-21 NOTE — Assessment & Plan Note (Signed)
LDL of 115 which is a slight increase from last year.  Compliant to pravastatin. Continue same. Work on [employer] and exercise.

## 2019-03-21 NOTE — Assessment & Plan Note (Signed)
Compliant to Boniva monthly, also participating in weight bearing exercise. Repeat bone density pending.

## 2019-03-21 NOTE — Assessment & Plan Note (Signed)
Stable in the office today, continue current regimen. 

## 2019-03-21 NOTE — Assessment & Plan Note (Addendum)
Following with pain management, feels well managed. 

## 2019-03-21 NOTE — Assessment & Plan Note (Signed)
Noted on recent labs. Repeat renal function in one month.

## 2019-03-21 NOTE — Assessment & Plan Note (Signed)
Immunizations UTD. Mammogram and bone density pending. Overdue for repeat colonoscopy, she will call to schedule in 2021. Commended her on regular exercise, continue to work on a healthy diet. Exam today as noted. Labs reviewed.

## 2019-03-21 NOTE — Assessment & Plan Note (Addendum)
Following with neurology, doing both Amovig and Botox. I also see Zyprexa on her list which is not on patients home medication list.   Given symptoms of weight gain and left lower extremity shaking this could be side effects. She will check her medication supply at home and update.

## 2019-03-21 NOTE — Patient Instructions (Addendum)
The olanzapine (Zyprexa) may be contributing to your weight gain. This medication can also cause what's called akathisia which may be the cause of your leg moving.  I recommend you speak with your psychiatrist about the olanzapine (Zyprexa). Also mention this to Dr. Jaynee Eagles.  Continue exercising. You should be getting 150 minutes of moderate intensity exercise weekly.   Continue to eat a healthy diet. Ensure you are consuming 64 ounces of water daily.  Make sure to get the bone density can along with your mammogram.  It was a pleasure to see you today!   Preventive Care 77 Years and Older, Female Preventive care refers to lifestyle choices and visits with your health care provider that can promote health and wellness. This includes:  A yearly physical exam. This is also called an annual well check.  Regular dental and eye exams.  Immunizations.  Screening for certain conditions.  Healthy lifestyle choices, such as diet and exercise. What can I expect for my preventive care visit? Physical exam Your health care provider will check:  Height and weight. These may be used to calculate body mass index (BMI), which is a measurement that tells if you are at a healthy weight.  Heart rate and blood pressure.  Your skin for abnormal spots. Counseling Your health care provider may ask you questions about:  Alcohol, tobacco, and drug use.  Emotional well-being.  Home and relationship well-being.  Sexual activity.  Eating habits.  History of falls.  Memory and ability to understand (cognition).  Work and work Statistician.  Pregnancy and menstrual history. What immunizations do I need?  Influenza (flu) vaccine  This is recommended every year. Tetanus, diphtheria, and pertussis (Tdap) vaccine  You may need a Td booster every 10 years. Varicella (chickenpox) vaccine  You may need this vaccine if you have not already been vaccinated. Zoster (shingles) vaccine  You may  need this after age 80. Pneumococcal conjugate (PCV13) vaccine  One dose is recommended after age 24. Pneumococcal polysaccharide (PPSV23) vaccine  One dose is recommended after age 20. Measles, mumps, and rubella (MMR) vaccine  You may need at least one dose of MMR if you were born in 1957 or later. You may also need a second dose. Meningococcal conjugate (MenACWY) vaccine  You may need this if you have certain conditions. Hepatitis A vaccine  You may need this if you have certain conditions or if you travel or work in places where you may be exposed to hepatitis A. Hepatitis B vaccine  You may need this if you have certain conditions or if you travel or work in places where you may be exposed to hepatitis B. Haemophilus influenzae type b (Hib) vaccine  You may need this if you have certain conditions. You may receive vaccines as individual doses or as more than one vaccine together in one shot (combination vaccines). Talk with your health care provider about the risks and benefits of combination vaccines. What tests do I need? Blood tests  Lipid and cholesterol levels. These may be checked every 5 years, or more frequently depending on your overall health.  Hepatitis C test.  Hepatitis B test. Screening  Lung cancer screening. You may have this screening every year starting at age 59 if you have a 30-pack-year history of smoking and currently smoke or have quit within the past 15 years.  Colorectal cancer screening. All adults should have this screening starting at age 6 and continuing until age 41. Your health care provider may recommend  screening at age 63 if you are at increased risk. You will have tests every 1-10 years, depending on your results and the type of screening test.  Diabetes screening. This is done by checking your blood sugar (glucose) after you have not eaten for a while (fasting). You may have this done every 1-3 years.  Mammogram. This may be done every  1-2 years. Talk with your health care provider about how often you should have regular mammograms.  BRCA-related cancer screening. This may be done if you have a family history of breast, ovarian, tubal, or peritoneal cancers. Other tests  Sexually transmitted disease (STD) testing.  Bone density scan. This is done to screen for osteoporosis. You may have this done starting at age 30. Follow these instructions at home: Eating and drinking  Eat a diet that includes fresh fruits and vegetables, whole grains, lean protein, and low-fat dairy products. Limit your intake of foods with high amounts of sugar, saturated fats, and salt.  Take vitamin and mineral supplements as recommended by your health care provider.  Do not drink alcohol if your health care provider tells you not to drink.  If you drink alcohol: ? Limit how much you have to 0-1 drink a day. ? Be aware of how much alcohol is in your drink. In the [place] one drink equals one 12 oz bottle of beer (355 mL), one 5 oz glass of wine (148 mL), or one 1 oz glass of hard liquor (44 mL). Lifestyle  Take daily care of your teeth and gums.  Stay active. Exercise for at least 30 minutes on 5 or more days each week.  Do not use any products that contain nicotine or tobacco, such as cigarettes, e-cigarettes, and chewing tobacco. If you need help quitting, ask your health care provider.  If you are sexually active, practice safe sex. Use a condom or other form of protection in order to prevent STIs (sexually transmitted infections).  Talk with your health care provider about taking a low-dose aspirin or statin. What's next?  Go to your health care provider once a year for a well check visit.  Ask your health care provider how often you should have your eyes and teeth checked.  Stay up to date on all vaccines. This information is not intended to replace advice given to you by your health care provider. Make sure you discuss any questions  you have with your health care provider. Document Released: 05/17/2015 Document Revised: 04/14/2018 Document Reviewed: 04/14/2018 Elsevier Patient Education  2020 [employer].

## 2019-03-22 DIAGNOSIS — F3342 Major depressive disorder, recurrent, in full remission: Secondary | ICD-10-CM | POA: Diagnosis not present

## 2019-03-22 DIAGNOSIS — F411 Generalized anxiety disorder: Secondary | ICD-10-CM | POA: Diagnosis not present

## 2019-03-29 ENCOUNTER — Other Ambulatory Visit: Payer: Self-pay

## 2019-04-10 ENCOUNTER — Telehealth: Payer: Self-pay | Admitting: Neurology

## 2019-04-10 NOTE — Telephone Encounter (Signed)
Pt called in wanting to schedule a Botox Appt, pt states the afternoons work best for her.

## 2019-04-11 ENCOUNTER — Ambulatory Visit (INDEPENDENT_AMBULATORY_CARE_PROVIDER_SITE_OTHER): Payer: PPO | Admitting: Neurology

## 2019-04-11 ENCOUNTER — Other Ambulatory Visit: Payer: Self-pay

## 2019-04-11 VITALS — Temp 97.4°F

## 2019-04-11 DIAGNOSIS — G43709 Chronic migraine without aura, not intractable, without status migrainosus: Secondary | ICD-10-CM | POA: Diagnosis not present

## 2019-04-11 NOTE — Progress Notes (Addendum)
Consent Form Botulism Toxin Injection For Chronic Migraine   Interval history 04/11/2019:  >80% decrease in migraine frequency and severity since starting botox. Can try Reglan prn and also give samples of nurtec. Imitrex did not work. She says she has gained weight, discussed diet and if she likes we can refer to [facility].   Consent Form Botulism Toxin Injection For Chronic Migraine    Reviewed orally with patient, additionally signature is on file:  Botulism toxin has been approved by the Federal drug administration for treatment of chronic migraine. Botulism toxin does not cure chronic migraine and it may not be effective in some patients.  The administration of botulism toxin is accomplished by injecting a small amount of toxin into the muscles of the neck and head. Dosage must be titrated for each individual. Any benefits resulting from botulism toxin tend to wear off after 3 months with a repeat injection required if benefit is to be maintained. Injections are usually done every 3-4 months with maximum effect peak achieved by about 2 or 3 weeks. Botulism toxin is expensive and you should be sure of what costs you will incur resulting from the injection.  The side effects of botulism toxin use for chronic migraine may include:   -Transient, and usually mild, facial weakness with facial injections  -Transient, and usually mild, head or neck weakness with head/neck injections  -Reduction or loss of forehead facial animation due to forehead muscle weakness  -Eyelid drooping  -Dry eye  -Pain at the site of injection or bruising at the site of injection  -Double vision  -Potential unknown long term risks  Contraindications: You should not have Botox if you are pregnant, nursing, allergic to albumin, have an infection, skin condition, or muscle weakness at the site of the injection, or have myasthenia gravis, Lambert-Eaton syndrome, or ALS.  It is also possible that as  with any injection, there may be an allergic reaction or no effect from the medication. Reduced effectiveness after repeated injections is sometimes seen and rarely infection at the injection site may occur. All care will be taken to prevent these side effects. If therapy is given over a long time, atrophy and wasting in the muscle injected may occur. Occasionally the patient's become refractory to treatment because they develop antibodies to the toxin. In this event, therapy needs to be modified.  I have read the above information and consent to the administration of botulism toxin.    BOTOX PROCEDURE NOTE FOR MIGRAINE HEADACHE    Contraindications and precautions discussed with patient(above). Aseptic procedure was observed and patient tolerated procedure. Procedure performed by Dr. Georgia Dom  The condition has existed for more than 6 months, and pt does not have a diagnosis of ALS, Myasthenia Gravis or Lambert-Eaton Syndrome.  Risks and benefits of injections discussed and pt agrees to proceed with the procedure.  Written consent obtained  These injections are medically necessary. Pt  receives good benefits from these injections. These injections do not cause sedations or hallucinations which the oral therapies may cause.  Description of procedure:  The patient was placed in a sitting position. The standard protocol was used for Botox as follows, with 5 units of Botox injected at each site:   -Procerus muscle, midline injection  -Corrugator muscle, bilateral injection  -Frontalis muscle, bilateral injection, with 2 sites each side, medial injection was performed in the upper one third of the frontalis muscle, in the region vertical from the medial inferior edge of  the superior orbital rim. The lateral injection was again in the upper one third of the forehead vertically above the lateral limbus of the cornea, 1.5 cm lateral to the medial injection site.  -Temporalis muscle injection, 4  sites, bilaterally. The first injection was 3 cm above the tragus of the ear, second injection site was 1.5 cm to 3 cm up from the first injection site in line with the tragus of the ear. The third injection site was 1.5-3 cm forward between the first 2 injection sites. The fourth injection site was 1.5 cm posterior to the second injection site.   -Occipitalis muscle injection, 3 sites, bilaterally. The first injection was done one half way between the occipital protuberance and the tip of the mastoid process behind the ear. The second injection site was done lateral and superior to the first, 1 fingerbreadth from the first injection. The third injection site was 1 fingerbreadth superiorly and medially from the first injection site.  -Cervical paraspinal muscle injection, 2 sites, bilateral knee first injection site was 1 cm from the midline of the cervical spine, 3 cm inferior to the lower border of the occipital protuberance. The second injection site was 1.5 cm superiorly and laterally to the first injection site.  -Trapezius muscle injection was performed at 3 sites, bilaterally. The first injection site was in the upper trapezius muscle halfway between the inflection point of the neck, and the acromion. The second injection site was one half way between the acromion and the first injection site. The third injection was done between the first injection site and the inflection point of the neck.   Will return for repeat injection in 3 months.   200 units of Botox was used, any Botox not injected was wasted. The patient tolerated the procedure well, there were no complications of the above procedure.

## 2019-04-11 NOTE — Telephone Encounter (Signed)
I called and scheduled the patient for today at 230.

## 2019-04-11 NOTE — Progress Notes (Signed)
Botox- 200 units x 1 vial Lot: [id number] Expiration: 12/2021 NDC: [id number]  Bacteriostatic 0.9% Sodium Chloride- 16mL total Lot: [id number] Expiration: 08/03/2019 NDC: [id number]  Dx: [id number] B/B

## 2019-04-18 ENCOUNTER — Ambulatory Visit
Admission: RE | Admit: 2019-04-18 | Discharge: 2019-04-18 | Disposition: A | Discharge status: Home / Self Care | Payer: PPO | Source: Ambulatory Visit | Attending: Internal Medicine | Admitting: Internal Medicine

## 2019-04-18 ENCOUNTER — Other Ambulatory Visit: Payer: Self-pay

## 2019-04-18 DIAGNOSIS — Z1231 Encounter for screening mammogram for malignant neoplasm of breast: Secondary | ICD-10-CM

## 2019-04-20 ENCOUNTER — Other Ambulatory Visit: Payer: Self-pay | Admitting: Primary Care

## 2019-04-20 DIAGNOSIS — F329 Major depressive disorder, single episode, unspecified: Secondary | ICD-10-CM

## 2019-04-20 DIAGNOSIS — F32A Depression, unspecified: Secondary | ICD-10-CM

## 2019-04-20 DIAGNOSIS — F419 Anxiety disorder, unspecified: Secondary | ICD-10-CM

## 2019-04-20 DIAGNOSIS — M810 Age-related osteoporosis without current pathological fracture: Secondary | ICD-10-CM

## 2019-05-11 DIAGNOSIS — Z79899 Other long term (current) drug therapy: Secondary | ICD-10-CM | POA: Diagnosis not present

## 2019-05-11 DIAGNOSIS — M47812 Spondylosis without myelopathy or radiculopathy, cervical region: Secondary | ICD-10-CM | POA: Diagnosis not present

## 2019-05-11 DIAGNOSIS — G894 Chronic pain syndrome: Secondary | ICD-10-CM | POA: Diagnosis not present

## 2019-05-22 DIAGNOSIS — Z85828 Personal history of other malignant neoplasm of skin: Secondary | ICD-10-CM | POA: Diagnosis not present

## 2019-05-22 DIAGNOSIS — L820 Inflamed seborrheic keratosis: Secondary | ICD-10-CM | POA: Diagnosis not present

## 2019-05-23 ENCOUNTER — Other Ambulatory Visit: Payer: Self-pay | Admitting: Primary Care

## 2019-05-23 DIAGNOSIS — I10 Essential (primary) hypertension: Secondary | ICD-10-CM

## 2019-05-25 DIAGNOSIS — J4520 Mild intermittent asthma, uncomplicated: Secondary | ICD-10-CM

## 2019-05-25 NOTE — Telephone Encounter (Signed)
No inhalers listed on current medication list.

## 2019-05-26 MED ORDER — ALBUTEROL SULFATE HFA 108 (90 BASE) MCG/ACT IN AERS: 2.0000 | INHALATION_SPRAY | Freq: Four times a day (QID) | RESPIRATORY_TRACT | 0 refills | Status: DC | PRN

## 2019-05-29 ENCOUNTER — Other Ambulatory Visit: Payer: Self-pay | Admitting: Primary Care

## 2019-05-29 DIAGNOSIS — E785 Hyperlipidemia, unspecified: Secondary | ICD-10-CM

## 2019-05-30 ENCOUNTER — Telehealth: Payer: Self-pay

## 2019-05-30 DIAGNOSIS — E785 Hyperlipidemia, unspecified: Secondary | ICD-10-CM

## 2019-05-30 DIAGNOSIS — R7303 Prediabetes: Secondary | ICD-10-CM

## 2019-05-30 NOTE — Telephone Encounter (Signed)
Please congratulate patient for me! That's great! If she can wait until on or after February 11th we can also check  Her A1C (blood sugar test). It's too soon to check prior to that date. Labs ordered.

## 2019-05-30 NOTE — Telephone Encounter (Signed)
Pt called to schedule lipid lab. Thought it had been discussed at [facility] that she needed to have a lab in a few months. She has been dieting and lost 20 lbs!! The [facility] note did not mention it. Please advise if she needs to repeat. Please call [phone]

## 2019-05-31 NOTE — Telephone Encounter (Signed)
Spoken and notified patient of [name] comments. Patient verbalized understanding. Lab appt 06/15/2019

## 2019-06-14 ENCOUNTER — Other Ambulatory Visit: Payer: Self-pay | Admitting: Primary Care

## 2019-06-14 DIAGNOSIS — Z79899 Other long term (current) drug therapy: Secondary | ICD-10-CM | POA: Diagnosis not present

## 2019-06-14 DIAGNOSIS — Z9189 Other specified personal risk factors, not elsewhere classified: Secondary | ICD-10-CM | POA: Diagnosis not present

## 2019-06-14 DIAGNOSIS — M47812 Spondylosis without myelopathy or radiculopathy, cervical region: Secondary | ICD-10-CM | POA: Diagnosis not present

## 2019-06-14 DIAGNOSIS — J4520 Mild intermittent asthma, uncomplicated: Secondary | ICD-10-CM

## 2019-06-14 DIAGNOSIS — G894 Chronic pain syndrome: Secondary | ICD-10-CM | POA: Diagnosis not present

## 2019-06-15 ENCOUNTER — Other Ambulatory Visit: Payer: Self-pay

## 2019-06-15 ENCOUNTER — Other Ambulatory Visit (INDEPENDENT_AMBULATORY_CARE_PROVIDER_SITE_OTHER): Payer: PPO

## 2019-06-15 DIAGNOSIS — E785 Hyperlipidemia, unspecified: Secondary | ICD-10-CM

## 2019-06-15 DIAGNOSIS — R7303 Prediabetes: Secondary | ICD-10-CM | POA: Diagnosis not present

## 2019-06-15 LAB — LIPID PANEL
Cholesterol: 166 mg/dL (ref 0–200)
HDL: 46.6 mg/dL (ref >39.00)
LDL Cholesterol: 88 mg/dL (ref 0–99)
NonHDL: 119.62
Total CHOL/HDL Ratio: 4
Triglycerides: 156 mg/dL — ABNORMAL HIGH (ref 0.0–149.0)
VLDL: 31.2 mg/dL (ref 0.0–40.0)

## 2019-06-15 LAB — HEMOGLOBIN A1C: Hgb A1c MFr Bld: 5.9 % (ref 4.6–6.5)

## 2019-06-16 ENCOUNTER — Other Ambulatory Visit: Payer: Self-pay

## 2019-06-16 ENCOUNTER — Ambulatory Visit
Admission: RE | Admit: 2019-06-16 | Discharge: 2019-06-16 | Disposition: A | Discharge status: Home / Self Care | Payer: PPO | Source: Ambulatory Visit | Attending: Primary Care | Admitting: Primary Care

## 2019-06-16 DIAGNOSIS — Z780 Asymptomatic menopausal state: Secondary | ICD-10-CM | POA: Diagnosis not present

## 2019-06-16 DIAGNOSIS — E2839 Other primary ovarian failure: Secondary | ICD-10-CM

## 2019-06-16 DIAGNOSIS — M85851 Other specified disorders of bone density and structure, right thigh: Secondary | ICD-10-CM | POA: Diagnosis not present

## 2019-07-03 ENCOUNTER — Other Ambulatory Visit: Payer: Self-pay | Admitting: Neurology

## 2019-07-07 DIAGNOSIS — Z79899 Other long term (current) drug therapy: Secondary | ICD-10-CM | POA: Diagnosis not present

## 2019-07-07 DIAGNOSIS — M47812 Spondylosis without myelopathy or radiculopathy, cervical region: Secondary | ICD-10-CM | POA: Diagnosis not present

## 2019-07-07 DIAGNOSIS — G894 Chronic pain syndrome: Secondary | ICD-10-CM | POA: Diagnosis not present

## 2019-07-12 ENCOUNTER — Telehealth: Payer: Self-pay | Admitting: *Deleted

## 2019-07-12 NOTE — Telephone Encounter (Signed)
I called patients insurance company and spoke to [place] to verify that [phone] and [phone] are covered and no precert is required.  Reference number for call is [id number].

## 2019-07-18 ENCOUNTER — Ambulatory Visit (INDEPENDENT_AMBULATORY_CARE_PROVIDER_SITE_OTHER): Payer: PPO | Admitting: Neurology

## 2019-07-18 VITALS — Temp 97.0°F

## 2019-07-18 DIAGNOSIS — G43709 Chronic migraine without aura, not intractable, without status migrainosus: Secondary | ICD-10-CM | POA: Diagnosis not present

## 2019-07-18 MED ORDER — AJOVY 225 MG/1.5ML ~~LOC~~ SOAJ: 225.0000 mg | SUBCUTANEOUS | 11 refills | Status: DC

## 2019-07-18 NOTE — Progress Notes (Signed)
Botox- 100 units x 2 vials Lot: [id number] Expiration: 02/2022 NDC: [id number]  Bacteriostatic 0.9% Sodium Chloride- 7mL total Lot: [id number] Expiration: 08/03/2019 NDC: [id number]  Dx: [id number] B/B

## 2019-07-18 NOTE — Progress Notes (Signed)
Consent Form Botulism Toxin Injection For Chronic Migraine   Interval history 07/18/2019:  >80% decrease in migraine frequency and severity since starting botox. She clenches. She feels the botox wears off near the end.    Prior: Can try Reglan prn and also give samples of nurtec. Imitrex did not work. She says she has gained weight, discussed diet and if she likes we can refer [facility]. She clenches.   Consent Form Botulism Toxin Injection For Chronic Migraine    Reviewed orally with patient, additionally signature is on file:  Botulism toxin has been approved by the Federal drug administration for treatment of chronic migraine. Botulism toxin does not cure chronic migraine and it may not be effective in some patients.  The administration of botulism toxin is accomplished by injecting a small amount of toxin into the muscles of the neck and head. Dosage must be titrated for each individual. Any benefits resulting from botulism toxin tend to wear off after 3 months with a repeat injection required if benefit is to be maintained. Injections are usually done every 3-4 months with maximum effect peak achieved by about 2 or 3 weeks. Botulism toxin is expensive and you should be sure of what costs you will incur resulting from the injection.  The side effects of botulism toxin use for chronic migraine may include:   -Transient, and usually mild, facial weakness with facial injections  -Transient, and usually mild, head or neck weakness with head/neck injections  -Reduction or loss of forehead facial animation due to forehead muscle weakness  -Eyelid drooping  -Dry eye  -Pain at the site of injection or bruising at the site of injection  -Double vision  -Potential unknown long term risks  Contraindications: You should not have Botox if you are pregnant, nursing, allergic to albumin, have an infection, skin condition, or muscle weakness at the site of the injection, or have  myasthenia gravis, Lambert-Eaton syndrome, or ALS.  It is also possible that as with any injection, there may be an allergic reaction or no effect from the medication. Reduced effectiveness after repeated injections is sometimes seen and rarely infection at the injection site may occur. All care will be taken to prevent these side effects. If therapy is given over a long time, atrophy and wasting in the muscle injected may occur. Occasionally the patient's become refractory to treatment because they develop antibodies to the toxin. In this event, therapy needs to be modified.  I have read the above information and consent to the administration of botulism toxin.    BOTOX PROCEDURE NOTE FOR MIGRAINE HEADACHE    Contraindications and precautions discussed with patient(above). Aseptic procedure was observed and patient tolerated procedure. Procedure performed by Dr. Georgia Dom  The condition has existed for more than 6 months, and pt does not have a diagnosis of ALS, Myasthenia Gravis or Lambert-Eaton Syndrome.  Risks and benefits of injections discussed and pt agrees to proceed with the procedure.  Written consent obtained  These injections are medically necessary. Pt  receives good benefits from these injections. These injections do not cause sedations or hallucinations which the oral therapies may cause.  Description of procedure:  The patient was placed in a sitting position. The standard protocol was used for Botox as follows, with 5 units of Botox injected at each site:   -Procerus muscle, midline injection  -Corrugator muscle, bilateral injection  -Frontalis muscle, bilateral injection, with 2 sites each side, medial injection was performed in the  upper one third of the frontalis muscle, in the region vertical from the medial inferior edge of the superior orbital rim. The lateral injection was again in the upper one third of the forehead vertically above the lateral limbus of the  cornea, 1.5 cm lateral to the medial injection site.  -Temporalis muscle injection, 4 sites, bilaterally. The first injection was 3 cm above the tragus of the ear, second injection site was 1.5 cm to 3 cm up from the first injection site in line with the tragus of the ear. The third injection site was 1.5-3 cm forward between the first 2 injection sites. The fourth injection site was 1.5 cm posterior to the second injection site.   -Occipitalis muscle injection, 3 sites, bilaterally. The first injection was done one half way between the occipital protuberance and the tip of the mastoid process behind the ear. The second injection site was done lateral and superior to the first, 1 fingerbreadth from the first injection. The third injection site was 1 fingerbreadth superiorly and medially from the first injection site.  -Cervical paraspinal muscle injection, 2 sites, bilateral knee first injection site was 1 cm from the midline of the cervical spine, 3 cm inferior to the lower border of the occipital protuberance. The second injection site was 1.5 cm superiorly and laterally to the first injection site.  -Trapezius muscle injection was performed at 3 sites, bilaterally. The first injection site was in the upper trapezius muscle halfway between the inflection point of the neck, and the acromion. The second injection site was one half way between the acromion and the first injection site. The third injection was done between the first injection site and the inflection point of the neck.   Will return for repeat injection in 3 months.   200 units of Botox was used, any Botox not injected was wasted. The patient tolerated the procedure well, there were no complications of the above procedure.

## 2019-07-27 ENCOUNTER — Other Ambulatory Visit: Payer: Self-pay | Admitting: Neurology

## 2019-07-28 ENCOUNTER — Telehealth: Payer: Self-pay | Admitting: Neurology

## 2019-07-28 NOTE — Telephone Encounter (Signed)
Pt has called and r/s her appointment for 07-06 for Botox

## 2019-07-31 NOTE — Telephone Encounter (Signed)
Noted thanks °

## 2019-08-10 ENCOUNTER — Telehealth: Payer: Self-pay | Admitting: *Deleted

## 2019-08-10 NOTE — Telephone Encounter (Signed)
Received paper Ajovy PA from [place]. PA completed and faxed back to [facility] along with office notes. Received a receipt of confirmation. Per Dr. Jaynee Eagles, pt changed from [place] to Ajovy due to side effects.

## 2019-08-14 NOTE — Telephone Encounter (Signed)
Received Ajovy approval from [employer]. Approved through 05/03/20. I notified pt via mychart.

## 2019-08-17 ENCOUNTER — Other Ambulatory Visit: Payer: Self-pay | Admitting: Neurology

## 2019-09-01 ENCOUNTER — Other Ambulatory Visit: Payer: Self-pay | Admitting: Neurology

## 2019-09-01 DIAGNOSIS — G43709 Chronic migraine without aura, not intractable, without status migrainosus: Secondary | ICD-10-CM

## 2019-09-06 DIAGNOSIS — M542 Cervicalgia: Secondary | ICD-10-CM | POA: Diagnosis not present

## 2019-09-06 DIAGNOSIS — M545 Low back pain: Secondary | ICD-10-CM | POA: Diagnosis not present

## 2019-09-06 DIAGNOSIS — Z79899 Other long term (current) drug therapy: Secondary | ICD-10-CM | POA: Diagnosis not present

## 2019-09-06 DIAGNOSIS — G8929 Other chronic pain: Secondary | ICD-10-CM | POA: Diagnosis not present

## 2019-09-06 DIAGNOSIS — G894 Chronic pain syndrome: Secondary | ICD-10-CM | POA: Diagnosis not present

## 2019-09-19 DIAGNOSIS — F3342 Major depressive disorder, recurrent, in full remission: Secondary | ICD-10-CM | POA: Diagnosis not present

## 2019-09-19 DIAGNOSIS — F411 Generalized anxiety disorder: Secondary | ICD-10-CM | POA: Diagnosis not present

## 2019-09-25 DIAGNOSIS — Z79891 Long term (current) use of opiate analgesic: Secondary | ICD-10-CM | POA: Diagnosis not present

## 2019-10-12 ENCOUNTER — Encounter: Payer: Self-pay | Admitting: Gastroenterology

## 2019-10-13 ENCOUNTER — Other Ambulatory Visit: Payer: Self-pay | Admitting: Primary Care

## 2019-10-13 DIAGNOSIS — F419 Anxiety disorder, unspecified: Secondary | ICD-10-CM

## 2019-10-23 ENCOUNTER — Ambulatory Visit: Payer: Self-pay | Admitting: Neurology

## 2019-10-24 ENCOUNTER — Ambulatory Visit: Payer: PPO | Admitting: Neurology

## 2019-11-03 DIAGNOSIS — Z79899 Other long term (current) drug therapy: Secondary | ICD-10-CM | POA: Diagnosis not present

## 2019-11-03 DIAGNOSIS — G894 Chronic pain syndrome: Secondary | ICD-10-CM | POA: Diagnosis not present

## 2019-11-03 DIAGNOSIS — M545 Low back pain: Secondary | ICD-10-CM | POA: Diagnosis not present

## 2019-11-03 DIAGNOSIS — G8929 Other chronic pain: Secondary | ICD-10-CM | POA: Diagnosis not present

## 2019-11-07 ENCOUNTER — Ambulatory Visit (INDEPENDENT_AMBULATORY_CARE_PROVIDER_SITE_OTHER): Payer: PPO | Admitting: Neurology

## 2019-11-07 DIAGNOSIS — G43709 Chronic migraine without aura, not intractable, without status migrainosus: Secondary | ICD-10-CM

## 2019-11-07 NOTE — Progress Notes (Signed)
Botox- 200 units x 1 vial Lot: [id number] Expiration: 07/2022 NDC: 0071-2197-58  Bacteriostatic 0.9% Sodium Chloride- 60mL total Lot: [id number] Expiration: 02/01/2021 NDC: 8264-1583-09  Dx: M07.680 B/B

## 2019-11-07 NOTE — Progress Notes (Signed)
Consent Form Botulism Toxin Injection For Chronic Migraine   Interval history 11/07/2019:  100% decrease in migraine frequency and severity since starting botox. She clenches. She feels the botox wears off near the end. But in the last 2 months she hasn't had even one headache or migraine.   Prior: Can try Reglan prn and also give samples of nurtec. Imitrex did not work. She says she has gained weight, discussed diet and if she likes [facility] can [facility]. She clenches.   Consent Form Botulism Toxin Injection For Chronic Migraine    Reviewed orally with patient, additionally signature is on file:  Botulism toxin has been approved by the Federal drug administration for treatment of chronic migraine. Botulism toxin does not cure chronic migraine and it may not be effective in some patients.  The administration of botulism toxin is accomplished by injecting a small amount of toxin into the muscles of the neck and head. Dosage must be titrated for each individual. Any benefits resulting from botulism toxin tend to wear off after 3 months with a repeat injection required if benefit is to be maintained. Injections are usually done every 3-4 months with maximum effect peak achieved by about 2 or 3 weeks. Botulism toxin is expensive and you should be sure of what costs you will incur resulting from the injection.  The side effects of botulism toxin use for chronic migraine may include:   -Transient, and usually mild, facial weakness with facial injections  -Transient, and usually mild, head or neck weakness with head/neck injections  -Reduction or loss of forehead facial animation due to forehead muscle weakness  -Eyelid drooping  -Dry eye  -Pain at the site of injection or bruising at the site of injection  -Double vision  -Potential unknown long term risks  Contraindications: You should not have Botox if you are pregnant, nursing, allergic to albumin, have an infection, skin  condition, or muscle weakness at the site of the injection, or have myasthenia gravis, Lambert-Eaton syndrome, or ALS.  It is also possible that as with any injection, there may be an allergic reaction or no effect from the medication. Reduced effectiveness after repeated injections is sometimes seen and rarely infection at the injection site may occur. All care will be taken to prevent these side effects. If therapy is given over a long time, atrophy and wasting in the muscle injected may occur. Occasionally the patient's become refractory to treatment because they develop antibodies to the toxin. In this event, therapy needs to be modified.  I have read the above information and consent to the administration of botulism toxin.    BOTOX PROCEDURE NOTE FOR MIGRAINE HEADACHE    Contraindications and precautions discussed with patient(above). Aseptic procedure was observed and patient tolerated procedure. Procedure performed by Dr. Georgia Dom  The condition has existed for more than 6 months, and pt does not have a diagnosis of ALS, Myasthenia Gravis or Lambert-Eaton Syndrome.  Risks and benefits of injections discussed and pt agrees to proceed with the procedure.  Written consent obtained  These injections are medically necessary. Pt  receives good benefits from these injections. These injections do not cause sedations or hallucinations which the oral therapies may cause.  Description of procedure:  The patient was placed in a sitting position. The standard protocol was used for Botox as follows, with 5 units of Botox injected at each site:   -Procerus muscle, midline injection  -Corrugator muscle, bilateral injection  -Frontalis muscle,  bilateral injection, with 2 sites each side, medial injection was performed in the upper one third of the frontalis muscle, in the region vertical from the medial inferior edge of the superior orbital rim. The lateral injection was again in the upper one  third of the forehead vertically above the lateral limbus of the cornea, 1.5 cm lateral to the medial injection site.  -Temporalis muscle injection, 4 sites, bilaterally. The first injection was 3 cm above the tragus of the ear, second injection site was 1.5 cm to 3 cm up from the first injection site in line with the tragus of the ear. The third injection site was 1.5-3 cm forward between the first 2 injection sites. The fourth injection site was 1.5 cm posterior to the second injection site.   -Occipitalis muscle injection, 3 sites, bilaterally. The first injection was done one half way between the occipital protuberance and the tip of the mastoid process behind the ear. The second injection site was done lateral and superior to the first, 1 fingerbreadth from the first injection. The third injection site was 1 fingerbreadth superiorly and medially from the first injection site.  -Cervical paraspinal muscle injection, 2 sites, bilateral knee first injection site was 1 cm from the midline of the cervical spine, 3 cm inferior to the lower border of the occipital protuberance. The second injection site was 1.5 cm superiorly and laterally to the first injection site.  -Trapezius muscle injection was performed at 3 sites, bilaterally. The first injection site was in the upper trapezius muscle halfway between the inflection point of the neck, and the acromion. The second injection site was one half way between the acromion and the first injection site. The third injection was done between the first injection site and the inflection point of the neck.   Will return for repeat injection in 3 months.   200 units of Botox was used, any Botox not injected was wasted. The patient tolerated the procedure well, there were no complications of the above procedure.

## 2019-11-14 ENCOUNTER — Other Ambulatory Visit: Payer: Self-pay | Admitting: Primary Care

## 2019-11-14 DIAGNOSIS — I10 Essential (primary) hypertension: Secondary | ICD-10-CM

## 2019-11-20 ENCOUNTER — Other Ambulatory Visit: Payer: Self-pay | Admitting: Primary Care

## 2019-11-20 DIAGNOSIS — E785 Hyperlipidemia, unspecified: Secondary | ICD-10-CM

## 2019-11-28 ENCOUNTER — Other Ambulatory Visit: Payer: Self-pay | Admitting: *Deleted

## 2019-11-28 MED ORDER — ONDANSETRON HCL 4 MG PO TABS: ORAL_TABLET | ORAL | 6 refills | Status: DC

## 2019-11-28 NOTE — Telephone Encounter (Signed)
Received refill request for Ondansetron. Per Dr. Jaynee Eagles, ok to refill. #30 with 6 refills.

## 2019-12-04 DIAGNOSIS — E785 Hyperlipidemia, unspecified: Secondary | ICD-10-CM

## 2019-12-06 MED ORDER — ROSUVASTATIN CALCIUM 5 MG PO TABS: 5.0000 mg | ORAL_TABLET | Freq: Every day | ORAL | 0 refills | Status: DC

## 2019-12-11 DIAGNOSIS — Z85828 Personal history of other malignant neoplasm of skin: Secondary | ICD-10-CM | POA: Diagnosis not present

## 2019-12-11 DIAGNOSIS — C44319 Basal cell carcinoma of skin of other parts of face: Secondary | ICD-10-CM | POA: Diagnosis not present

## 2019-12-11 DIAGNOSIS — L814 Other melanin hyperpigmentation: Secondary | ICD-10-CM | POA: Diagnosis not present

## 2019-12-11 DIAGNOSIS — L821 Other seborrheic keratosis: Secondary | ICD-10-CM | POA: Diagnosis not present

## 2019-12-12 ENCOUNTER — Other Ambulatory Visit: Payer: Self-pay

## 2019-12-12 ENCOUNTER — Ambulatory Visit (AMBULATORY_SURGERY_CENTER): Payer: Self-pay

## 2019-12-12 ENCOUNTER — Encounter: Payer: Self-pay | Admitting: Gastroenterology

## 2019-12-12 VITALS — Ht 61.0 in | Wt 132.8 lb

## 2019-12-12 DIAGNOSIS — Z86010 Personal history of colonic polyps: Secondary | ICD-10-CM

## 2019-12-12 MED ORDER — NA SULFATE-K SULFATE-MG SULF 17.5-3.13-1.6 GM/177ML PO SOLN
1.0000 | Freq: Once | ORAL | 0 refills | Status: AC
Start: 2019-12-12 — End: 2019-12-12

## 2019-12-12 NOTE — Progress Notes (Signed)
Patient has adamantly refused to complete the double prep as requested by MD during pre visit appt--however, instructions were given for 2 day prep regardless as patient was asked to "please complete as requested due to it being needed to have a complete bowel prep for the procedure; patient receptive of information;   No egg or soy allergy known to patient  No issues with past sedation with any surgeries or procedures No intubation problems in the past  No FH of Malignant Hyperthermia No diet pills per patient No home 02 use per patient  No blood thinners per patient  Pt reports issues with constipation- No A fib or A flutter  EMMI video via [place] 19 guidelines implemented in PV today with Pt and RN   Coupon given to pt in [facility] today , Code to Pharmacy  COVID vaccines completed on 07/2019 per pt; Due to the COVID-19 pandemic we are asking patients to follow these guidelines. Please only bring one care partner. Please be aware that your care partner may wait in the car in the parking lot or if they feel like they will be too hot to wait in the car, they may wait in the lobby on the 4th floor. All care partners are required to wear a mask the entire time (we do not have any that we can provide them), they need to practice social distancing, and we will do a Covid check for all patient's and care partners when you arrive. Also we will check their temperature and your temperature. If the care partner waits in their car they need to stay in the parking lot the entire time and we will call them on their cell phone when the patient is ready for discharge so they can bring the car to the front of the building. Also all patient's will need to wear a mask into building.

## 2019-12-26 ENCOUNTER — Encounter: Payer: PPO | Admitting: Gastroenterology

## 2019-12-27 DIAGNOSIS — C44319 Basal cell carcinoma of skin of other parts of face: Secondary | ICD-10-CM | POA: Diagnosis not present

## 2019-12-27 DIAGNOSIS — Z85828 Personal history of other malignant neoplasm of skin: Secondary | ICD-10-CM | POA: Diagnosis not present

## 2019-12-29 ENCOUNTER — Other Ambulatory Visit: Payer: Self-pay | Admitting: Primary Care

## 2019-12-29 ENCOUNTER — Encounter: Payer: Self-pay | Admitting: Gastroenterology

## 2019-12-29 ENCOUNTER — Other Ambulatory Visit: Payer: Self-pay

## 2019-12-29 ENCOUNTER — Ambulatory Visit (AMBULATORY_SURGERY_CENTER): Payer: PPO | Admitting: Gastroenterology

## 2019-12-29 VITALS — BP 101/64 | HR 65 | Temp 97.5°F | Resp 16 | Ht 61.0 in | Wt 132.8 lb

## 2019-12-29 DIAGNOSIS — D123 Benign neoplasm of transverse colon: Secondary | ICD-10-CM | POA: Diagnosis not present

## 2019-12-29 DIAGNOSIS — D128 Benign neoplasm of rectum: Secondary | ICD-10-CM | POA: Diagnosis not present

## 2019-12-29 DIAGNOSIS — D122 Benign neoplasm of ascending colon: Secondary | ICD-10-CM | POA: Diagnosis not present

## 2019-12-29 DIAGNOSIS — E785 Hyperlipidemia, unspecified: Secondary | ICD-10-CM

## 2019-12-29 DIAGNOSIS — D120 Benign neoplasm of cecum: Secondary | ICD-10-CM

## 2019-12-29 DIAGNOSIS — Z1211 Encounter for screening for malignant neoplasm of colon: Secondary | ICD-10-CM | POA: Diagnosis not present

## 2019-12-29 DIAGNOSIS — Z86010 Personal history of colonic polyps: Secondary | ICD-10-CM

## 2019-12-29 DIAGNOSIS — D124 Benign neoplasm of descending colon: Secondary | ICD-10-CM | POA: Diagnosis not present

## 2019-12-29 DIAGNOSIS — I10 Essential (primary) hypertension: Secondary | ICD-10-CM | POA: Diagnosis not present

## 2019-12-29 MED ORDER — SODIUM CHLORIDE 0.9 % IV SOLN: 500.0000 mL | Freq: Once | INTRAVENOUS | Status: DC

## 2019-12-29 NOTE — Op Note (Signed)
[facility] Patient Name: [name] Procedure Date: 12/29/2019 9:37 AM MRN: [id number] Endoscopist: Milus Banister , MD Age: 69 Referring MD:  Date of Birth: [birth year] Gender: Female Account #: [id number] Procedure:                Colonoscopy Indications:              High risk colon cancer surveillance: Personal                            history of colonic polyps; Colonoscopy Dr. Marcello Moores                            2018 two TAs removed prior to sigmoid resection and                            rectopexy in 2018 Medicines:                Monitored Anesthesia Care Procedure:                Pre-Anesthesia Assessment:                           - Prior to the procedure, a History and Physical                            was performed, and patient medications and                            allergies were reviewed. The patient's tolerance of                            previous anesthesia was also reviewed. The risks                            and benefits of the procedure and the sedation                            options and risks were discussed with the patient.                            All questions were answered, and informed consent                            was obtained. Prior Anticoagulants: The patient has                            taken no previous anticoagulant or antiplatelet                            agents. ASA Grade Assessment: II - A patient with                            mild systemic disease. After reviewing the risks  and benefits, the patient was deemed in                            satisfactory condition to undergo the procedure.                           After obtaining informed consent, the colonoscope                            was passed under direct vision. Throughout the                            procedure, the patient's blood pressure, pulse, and                            oxygen saturations were monitored  continuously. The                            Colonoscope was introduced through the anus and                            advanced to the the cecum, identified by                            appendiceal orifice and ileocecal valve. The                            colonoscopy was performed without difficulty. The                            patient tolerated the procedure well. The quality                            of the bowel preparation was good. The ileocecal                            valve, appendiceal orifice, and rectum were                            photographed. Scope In: 9:50:38 AM Scope Out: 10:10:45 AM Scope Withdrawal Time: 0 hours 15 minutes 57 seconds  Total Procedure Duration: 0 hours 20 minutes 7 seconds  Findings:                 Five sessile polyps were found in the ascending                            colon and cecum. The polyps were 3 to 5 mm in size.                            These polyps were removed with a cold snare.                            Resection and retrieval were complete. jar 1  Five sessile polyps were found in the transverse                            colon. The polyps were 3 to 8 mm in size. These                            polyps were removed with a cold snare. Resection                            and retrieval were complete. jar 2                           Four sessile polyps were found in the rectum,                            sigmoid colon and descending colon. The polyps were                            2 to 5 mm in size. These polyps were removed with a                            cold snare. Resection and retrieval were complete.                            jar 3                           Normal appearing sigmoid anastomosis from 2018                            surgery.                           Multiple small and large-mouthed diverticula were                            found in the left colon.                            External and internal hemorrhoids were found. The                            hemorrhoids were small.                           The exam was otherwise without abnormality on                            direct and retroflexion views. Complications:            No immediate complications. Estimated blood loss:                            None. Estimated Blood Loss:     Estimated blood loss: none. Impression:               -  Five 3 to 5 mm polyps in the ascending colon and                            in the cecum, removed with a cold snare. Resected                            and retrieved.                           - Five 3 to 8 mm polyps in the transverse colon,                            removed with a cold snare. Resected and retrieved.                           - Four 2 to 5 mm polyps in the rectum, in the                            sigmoid colon and in the descending colon, removed                            with a cold snare. Resected and retrieved.                           - Normal appearing sigmoid anastomosis from 2018                            surgery.                           - Diverticulosis in the left colon.                           - External and internal hemorrhoids.                           - The examination was otherwise normal on direct                            and retroflexion views. Recommendation:           - Patient has a contact number available for                            emergencies. The signs and symptoms of potential                            delayed complications were discussed with the                            patient. Return to normal activities tomorrow.                            Written discharge instructions were provided to the  patient.                           - Resume previous diet.                           - Continue present medications.                           - Await pathology results. Milus Banister,  MD 12/29/2019 10:16:32 AM This report has been signed electronically.

## 2019-12-29 NOTE — Progress Notes (Signed)
Called to room to assist during endoscopic procedure.  Patient ID and intended procedure confirmed with present staff. Received instructions for my participation in the procedure from the performing physician.  

## 2019-12-29 NOTE — Progress Notes (Signed)
Pt's states no medical or surgical changes since previsit or office visit.  Vs CW  Front desk jb

## 2019-12-29 NOTE — Patient Instructions (Signed)

## 2019-12-29 NOTE — Progress Notes (Signed)
Report given to PACU, vss 

## 2020-01-01 DIAGNOSIS — F3342 Major depressive disorder, recurrent, in full remission: Secondary | ICD-10-CM | POA: Diagnosis not present

## 2020-01-01 DIAGNOSIS — F411 Generalized anxiety disorder: Secondary | ICD-10-CM | POA: Diagnosis not present

## 2020-01-02 ENCOUNTER — Telehealth: Payer: Self-pay

## 2020-01-02 NOTE — Telephone Encounter (Signed)
Left message on answering machine. 

## 2020-01-02 NOTE — Telephone Encounter (Signed)
  Follow up Call-  Call back number 12/29/2019  Post procedure Call Back phone  # [phone]  Permission to leave phone message Yes  Some recent data might be hidden     Patient questions:  Do you have a fever, pain , or abdominal swelling? No. Pain Score  0 *  Have you tolerated food without any problems? Yes.    Have you been able to return to your normal activities? Yes.    Do you have any questions about your discharge instructions: Diet   No. Medications  No. Follow up visit  No.  Do you have questions or concerns about your Care? No.  Actions: * If pain score is 4 or above: No action needed, pain <4.  1. Have you developed a fever since your procedure? no  2.   Have you had an respiratory symptoms (SOB or cough) since your procedure? no  3.   Have you tested positive for COVID 19 since your procedure no  4.   Have you had any family members/close contacts diagnosed with the COVID 19 since your procedure?  no   If yes to any of these questions please route to Joylene John, RN and Joella Prince, RN

## 2020-01-03 DIAGNOSIS — Z4802 Encounter for removal of sutures: Secondary | ICD-10-CM | POA: Diagnosis not present

## 2020-01-04 DIAGNOSIS — M47812 Spondylosis without myelopathy or radiculopathy, cervical region: Secondary | ICD-10-CM | POA: Diagnosis not present

## 2020-01-04 DIAGNOSIS — G894 Chronic pain syndrome: Secondary | ICD-10-CM | POA: Diagnosis not present

## 2020-01-04 DIAGNOSIS — Z79899 Other long term (current) drug therapy: Secondary | ICD-10-CM | POA: Diagnosis not present

## 2020-01-07 ENCOUNTER — Other Ambulatory Visit: Payer: Self-pay | Admitting: Neurology

## 2020-01-17 ENCOUNTER — Telehealth: Payer: Self-pay | Admitting: Neurology

## 2020-01-17 NOTE — Telephone Encounter (Signed)
Message added to pt chart.

## 2020-01-17 NOTE — Telephone Encounter (Signed)
I called the patient. She apologized and said she had gotten someone to help her. She denied having any questions to send to the provider at this time. She verbalized appreciation for the call.

## 2020-01-17 NOTE — Telephone Encounter (Signed)
Please call [name] tell her I know longer have the cell phone she calls and I cannot provide my new one per [facility] Policy HIPAA guideline. She will have to call the office or send emails thanks. Ask her kindly what she wanted thanks

## 2020-01-23 ENCOUNTER — Other Ambulatory Visit: Payer: Self-pay | Admitting: Primary Care

## 2020-01-23 DIAGNOSIS — E785 Hyperlipidemia, unspecified: Secondary | ICD-10-CM

## 2020-02-20 ENCOUNTER — Ambulatory Visit: Payer: PPO | Admitting: Neurology

## 2020-02-21 ENCOUNTER — Other Ambulatory Visit: Payer: Self-pay

## 2020-02-21 ENCOUNTER — Ambulatory Visit: Payer: PPO | Admitting: Adult Health

## 2020-02-21 DIAGNOSIS — G43709 Chronic migraine without aura, not intractable, without status migrainosus: Secondary | ICD-10-CM | POA: Diagnosis not present

## 2020-02-21 NOTE — Progress Notes (Signed)
       BOTOX PROCEDURE NOTE FOR MIGRAINE HEADACHE    Contraindications and precautions discussed with patient(above). Aseptic procedure was observed and patient tolerated procedure. Procedure performed by Ward Givens, NP  The condition has existed for more than 6 months, and pt does not have a diagnosis of ALS, Myasthenia Gravis or Lambert-Eaton Syndrome.  Risks and benefits of injections discussed and pt agrees to proceed with the procedure.  Written consent obtained  These injections are medically necessary. She receives good benefits from these injections. Essentially no headaches. These injections do not cause sedations or hallucinations which the oral therapies may cause.  Indication/Diagnosis: chronic migraine BOTOX(J0585) injection was performed according to protocol by [employer]. 200 units of BOTOX was dissolved into 4 cc NS.   NDC: [address]-40  Type of toxin: Botox  Botox- 100 units x 2 vials Lot: [id number] Expiration:06/2022 NDC: 8295-6213-08  Bacteriostatic 0.9% Sodium Chloride- 71mL total Lot: [id number] Expiration: 06/2021 NDC: 6295-2841-32  Dx: G40.102 B/B  Consent was given and signed by patient today.   Description of procedure:  The patient was placed in a sitting position. The standard protocol was used for Botox as follows, with 5 units of Botox injected at each site:   -Procerus muscle, midline injection  -Corrugator muscle, bilateral injection  -Frontalis muscle, bilateral injection, with 2 sites each side, medial injection was performed in the upper one third of the frontalis muscle, in the region vertical from the medial inferior edge of the superior orbital rim. The lateral injection was again in the upper one third of the forehead vertically above the lateral limbus of the cornea, 1.5 cm lateral to the medial injection site.  -Temporalis muscle injection, 4 sites, bilaterally. The first injection was 3 cm above the tragus of the ear, second  injection site was 1.5 cm to 3 cm up from the first injection site in line with the tragus of the ear. The third injection site was 1.5-3 cm forward between the first 2 injection sites. The fourth injection site was 1.5 cm posterior to the second injection site.  -Occipitalis muscle injection, 3 sites, bilaterally. The first injection was done one half way between the occipital protuberance and the tip of the mastoid process behind the ear. The second injection site was done lateral and superior to the first, 1 fingerbreadth from the first injection. The third injection site was 1 fingerbreadth superiorly and medially from the first injection site.  -Cervical paraspinal muscle injection, 2 sites, bilateral knee first injection site was 1 cm from the midline of the cervical spine, 3 cm inferior to the lower border of the occipital protuberance. The second injection site was 1.5 cm superiorly and laterally to the first injection site.  -Trapezius muscle injection was performed at 3 sites, bilaterally. The first injection site was in the upper trapezius muscle halfway between the inflection point of the neck, and the acromion. The second injection site was one half way between the acromion and the first injection site. The third injection was done between the first injection site and the inflection point of the neck.   Will return for repeat injection in 3 months.   A 200 unit sof Botox was used, 155 units were injected, the rest of the Botox was wasted. The patient tolerated the procedure well, there were no complications of the above procedure.  Ward Givens, MSN, NP-C 02/21/2020, 2:14 PM [facility] [address] [place], [place] [address] [phone]

## 2020-02-21 NOTE — Progress Notes (Signed)
Botox- 100 units x 2 vials Lot: [id number] Expiration:06/2022 NDC: 0856-9437-00  Bacteriostatic 0.9% Sodium Chloride- 45mL total Lot: [id number] Expiration: 06/2021 NDC: 8902-2840-69  Dx: E61.483 B/B  Consent was given and signed by patient today.

## 2020-03-05 DIAGNOSIS — M47812 Spondylosis without myelopathy or radiculopathy, cervical region: Secondary | ICD-10-CM | POA: Diagnosis not present

## 2020-03-05 DIAGNOSIS — G894 Chronic pain syndrome: Secondary | ICD-10-CM | POA: Diagnosis not present

## 2020-03-05 DIAGNOSIS — Z79899 Other long term (current) drug therapy: Secondary | ICD-10-CM | POA: Diagnosis not present

## 2020-03-08 ENCOUNTER — Other Ambulatory Visit: Payer: Self-pay | Admitting: Neurology

## 2020-03-19 DIAGNOSIS — F3342 Major depressive disorder, recurrent, in full remission: Secondary | ICD-10-CM | POA: Diagnosis not present

## 2020-03-19 DIAGNOSIS — F411 Generalized anxiety disorder: Secondary | ICD-10-CM | POA: Diagnosis not present

## 2020-03-26 ENCOUNTER — Encounter: Payer: Self-pay | Admitting: Primary Care

## 2020-03-26 ENCOUNTER — Other Ambulatory Visit: Payer: Self-pay

## 2020-03-26 ENCOUNTER — Ambulatory Visit (INDEPENDENT_AMBULATORY_CARE_PROVIDER_SITE_OTHER): Payer: PPO | Admitting: Primary Care

## 2020-03-26 VITALS — BP 124/62 | HR 65 | Temp 98.0°F | Ht 60.5 in | Wt 128.0 lb

## 2020-03-26 DIAGNOSIS — E559 Vitamin D deficiency, unspecified: Secondary | ICD-10-CM | POA: Diagnosis not present

## 2020-03-26 DIAGNOSIS — G43709 Chronic migraine without aura, not intractable, without status migrainosus: Secondary | ICD-10-CM | POA: Diagnosis not present

## 2020-03-26 DIAGNOSIS — E785 Hyperlipidemia, unspecified: Secondary | ICD-10-CM

## 2020-03-26 DIAGNOSIS — M19072 Primary osteoarthritis, left ankle and foot: Secondary | ICD-10-CM | POA: Diagnosis not present

## 2020-03-26 DIAGNOSIS — Z1231 Encounter for screening mammogram for malignant neoplasm of breast: Secondary | ICD-10-CM

## 2020-03-26 DIAGNOSIS — M810 Age-related osteoporosis without current pathological fracture: Secondary | ICD-10-CM

## 2020-03-26 DIAGNOSIS — Z0000 Encounter for general adult medical examination without abnormal findings: Secondary | ICD-10-CM | POA: Diagnosis not present

## 2020-03-26 DIAGNOSIS — D509 Iron deficiency anemia, unspecified: Secondary | ICD-10-CM | POA: Diagnosis not present

## 2020-03-26 DIAGNOSIS — R7303 Prediabetes: Secondary | ICD-10-CM

## 2020-03-26 DIAGNOSIS — J4520 Mild intermittent asthma, uncomplicated: Secondary | ICD-10-CM

## 2020-03-26 DIAGNOSIS — F32A Depression, unspecified: Secondary | ICD-10-CM

## 2020-03-26 DIAGNOSIS — F419 Anxiety disorder, unspecified: Secondary | ICD-10-CM

## 2020-03-26 DIAGNOSIS — I10 Essential (primary) hypertension: Secondary | ICD-10-CM | POA: Diagnosis not present

## 2020-03-26 DIAGNOSIS — Z86010 Personal history of colonic polyps: Secondary | ICD-10-CM

## 2020-03-26 DIAGNOSIS — G8921 Chronic pain due to trauma: Secondary | ICD-10-CM

## 2020-03-26 LAB — COMPREHENSIVE METABOLIC PANEL
ALT: 17 U/L (ref 0–35)
AST: 24 U/L (ref 0–37)
Albumin: 4 g/dL (ref 3.5–5.2)
Alkaline Phosphatase: 48 U/L (ref 39–117)
BUN: 14 mg/dL (ref 6–23)
CO2: 30 mEq/L (ref 19–32)
Calcium: 9.6 mg/dL (ref 8.4–10.5)
Chloride: 100 mEq/L (ref 96–112)
Creatinine, Ser: 0.89 mg/dL (ref 0.40–1.20)
GFR: 65.65 mL/min (ref >60.00)
Glucose, Bld: 90 mg/dL (ref 70–99)
Potassium: 4.2 mEq/L (ref 3.5–5.1)
Sodium: 136 mEq/L (ref 135–145)
Total Bilirubin: 0.6 mg/dL (ref 0.2–1.2)
Total Protein: 6.7 g/dL (ref 6.0–8.3)

## 2020-03-26 LAB — CBC
HCT: 39.7 % (ref 36.0–46.0)
Hemoglobin: 13.3 g/dL (ref 12.0–15.0)
MCHC: 33.5 g/dL (ref 30.0–36.0)
MCV: 92.2 fl (ref 78.0–100.0)
Platelets: 193 10*3/uL (ref 150.0–400.0)
RBC: 4.31 Mil/uL (ref 3.87–5.11)
RDW: 13.2 % (ref 11.5–15.5)
WBC: 7.8 10*3/uL (ref 4.0–10.5)

## 2020-03-26 LAB — LIPID PANEL
Cholesterol: 140 mg/dL (ref 0–200)
HDL: 57.8 mg/dL (ref >39.00)
LDL Cholesterol: 66 mg/dL (ref 0–99)
NonHDL: 82.54
Total CHOL/HDL Ratio: 2
Triglycerides: 84 mg/dL (ref 0.0–149.0)
VLDL: 16.8 mg/dL (ref 0.0–40.0)

## 2020-03-26 LAB — VITAMIN D 25 HYDROXY (VIT D DEFICIENCY, FRACTURES): VITD: 49.81 ng/mL (ref 30.00–100.00)

## 2020-03-26 LAB — HEMOGLOBIN A1C: Hgb A1c MFr Bld: 5.6 % (ref 4.6–6.5)

## 2020-03-26 MED ORDER — ROSUVASTATIN CALCIUM 5 MG PO TABS: ORAL_TABLET | ORAL | 3 refills | Status: DC

## 2020-03-26 MED ORDER — TRAZODONE HCL 50 MG PO TABS: 50.0000 mg | ORAL_TABLET | Freq: Every evening | ORAL | 3 refills | Status: DC | PRN

## 2020-03-26 NOTE — Assessment & Plan Note (Signed)
Immunizations UTD.  Mammogram due in December, orders placed. Bone density scan UTD, due in 2023. Colonoscopy UTD, due in 2022. Encouraged a healthy diet and regular exercise, commended her on this!  Exam today benign. Labs pending.

## 2020-03-26 NOTE — Assessment & Plan Note (Signed)
Follows with psychiatry who recently increased the does of her Vibryd. Continue daily Vibryd 20 mg and PRN clonazepam 1 mg.

## 2020-03-26 NOTE — Assessment & Plan Note (Addendum)
Completed in 2021, due again in 2022 due to numerous adenomatous polyps.

## 2020-03-26 NOTE — Patient Instructions (Signed)
Stop by the lab prior to leaving today. I will notify you of your results once received.   Continue to work on daily exercise.   Continue to work on a healthy diet. Ensure you are consuming 64 ounces of water daily.  [facility] to schedule your mammogram.   It was a pleasure to see you today!   Preventive Care 1 Years and Older, Female Preventive care refers to lifestyle choices and visits with your health care provider that can promote health and wellness. This includes:  A yearly physical exam. This is also called an annual well check.  Regular dental and eye exams.  Immunizations.  Screening for certain conditions.  Healthy lifestyle choices, such as diet and exercise. What can I expect for my preventive care visit? Physical exam Your health care provider will check:  Height and weight. These may be used to calculate body mass index (BMI), which is a measurement that tells if you are at a healthy weight.  Heart rate and blood pressure.  Your skin for abnormal spots. Counseling Your health care provider may ask you questions about:  Alcohol, tobacco, and drug use.  Emotional well-being.  Home and relationship well-being.  Sexual activity.  Eating habits.  History of falls.  Memory and ability to understand (cognition).  Work and work Statistician.  Pregnancy and menstrual history. What immunizations do I need?  Influenza (flu) vaccine  This is recommended every year. Tetanus, diphtheria, and pertussis (Tdap) vaccine  You may need a Td booster every 10 years. Varicella (chickenpox) vaccine  You may need this vaccine if you have not already been vaccinated. Zoster (shingles) vaccine  You may need this after age 1. Pneumococcal conjugate (PCV13) vaccine  One dose is recommended after age 81. Pneumococcal polysaccharide (PPSV23) vaccine  One dose is recommended after age 66. Measles, mumps, and rubella (MMR) vaccine  You may need at  least one dose of MMR if you were born in 1957 or later. You may also need a second dose. Meningococcal conjugate (MenACWY) vaccine  You may need this if you have certain conditions. Hepatitis A vaccine  You may need this if you have certain conditions or if you travel or work in places where you may be exposed to hepatitis A. Hepatitis B vaccine  You may need this if you have certain conditions or if you travel or work in places where you may be exposed to hepatitis B. Haemophilus influenzae type b (Hib) vaccine  You may need this if you have certain conditions. You may receive vaccines as individual doses or as more than one vaccine together in one shot (combination vaccines). Talk with your health care provider about the risks and benefits of combination vaccines. What tests do I need? Blood tests  Lipid and cholesterol levels. These may be checked every 5 years, or more frequently depending on your overall health.  Hepatitis C test.  Hepatitis B test. Screening  Lung cancer screening. You may have this screening every year starting at age 57 if you have a 30-pack-year history of smoking and currently smoke or have quit within the past 15 years.  Colorectal cancer screening. All adults should have this screening starting at age 18 and continuing until age 27. Your health care provider may recommend screening at age 63 if you are at increased risk. You will have tests every 1-10 years, depending on your results and the type of screening test.  Diabetes screening. This is done by checking your  blood sugar (glucose) after you have not eaten for a while (fasting). You may have this done every 1-3 years.  Mammogram. This may be done every 1-2 years. Talk with your health care provider about how often you should have regular mammograms.  BRCA-related cancer screening. This may be done if you have a family history of breast, ovarian, tubal, or peritoneal cancers. Other tests  Sexually  transmitted disease (STD) testing.  Bone density scan. This is done to screen for osteoporosis. You may have this done starting at age 37. Follow these instructions at home: Eating and drinking  Eat a diet that includes fresh fruits and vegetables, whole grains, lean protein, and low-fat dairy products. Limit your intake of foods with high amounts of sugar, saturated fats, and salt.  Take vitamin and mineral supplements as recommended by your health care provider.  Do not drink alcohol if your health care provider tells you not to drink.  If you drink alcohol: ? Limit how much you have to 0-1 drink a day. ? Be aware of how much alcohol is in your drink. In the [place] one drink equals one 12 oz bottle of beer (355 mL), one 5 oz glass of wine (148 mL), or one 1 oz glass of hard liquor (44 mL). Lifestyle  Take daily care of your teeth and gums.  Stay active. Exercise for at least 30 minutes on 5 or more days each week.  Do not use any products that contain nicotine or tobacco, such as cigarettes, e-cigarettes, and chewing tobacco. If you need help quitting, ask your health care provider.  If you are sexually active, practice safe sex. Use a condom or other form of protection in order to prevent STIs (sexually transmitted infections).  Talk with your health care provider about taking a low-dose aspirin or statin. What's next?  Go to your health care provider once a year for a well check visit.  Ask your health care provider how often you should have your eyes and teeth checked.  Stay up to date on all vaccines. This information is not intended to replace advice given to you by your health care provider. Make sure you discuss any questions you have with your health care provider. Document Revised: 04/14/2018 Document Reviewed: 04/14/2018 Elsevier Patient Education  2020 [employer].

## 2020-03-26 NOTE — Assessment & Plan Note (Signed)
Chronic, follows with pain management. Continue Percocet 5-325 mg.

## 2020-03-26 NOTE — Assessment & Plan Note (Signed)
Repeat CBC pending. 

## 2020-03-26 NOTE — Progress Notes (Signed)
Subjective:    Patient ID: [name], female    DOB: [birth year], 69 y.o.   MRN: [id number]  HPI  This visit occurred during the SARS-CoV-2 public health emergency.  Safety protocols were in place, including screening questions prior to the visit, additional usage of staff PPE, and extensive cleaning of exam room while observing appropriate contact time as indicated for disinfecting solutions.   [name] is a 69 year old female who presents today for complete physical.  Immunizations: -Tetanus: Completed in 2014 -Influenza: Completed this season  -Shingles: Completed Shingrix -Pneumonia: Completed Prevnar and Pneumovax -Covid-19: Completed series   Diet: She endorses a healthy diet.  Exercise: She walking on the treadmill for 1 hour daily, 6 days weekly.  Eye exam: Scheduled for December 2021 Dental exam: Completes regularly  Mammogram: Completed in December 2020 Dexa: Completed in 2021, osteopenia Colonoscopy: Completed in 2021, due again next year. Hep C Screen: Negative  BP Readings from Last 3 Encounters:  03/26/20 124/62  12/29/19 101/64  03/21/19 116/72      Review of Systems  Constitutional: Negative for unexpected weight change.  HENT: Negative for rhinorrhea.   Eyes: Negative for visual disturbance.  Respiratory: Negative for cough and shortness of breath.   Cardiovascular: Negative for chest pain.  Gastrointestinal: Negative for constipation and diarrhea.  Genitourinary: Negative for difficulty urinating.  Musculoskeletal: Positive for arthralgias.  Skin: Negative for rash.  Allergic/Immunologic: Negative for environmental allergies.  Neurological: Negative for dizziness and numbness.       Migraines improved with Envoy injections monthly.  Psychiatric/Behavioral:       Follows with psychiatry, recent dose increase to medications.        Past Medical History:  Diagnosis Date  . 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 Anxiety   . Asthma    . CAP (community acquired pneumonia)   . Cataract 2018   removed both eyes  . COLONIC POLYPS, HX OF 11/15/2009  . DEPRESSION 11/14/2009  . GASTROJEJ ULCR UNS ACUT/CHRN W/O HEMOR PERF/OBST 11/06/2009  . GERD (gastroesophageal reflux disease)    not on meds  . Headache(784.0) 11/15/2009  . Heart murmur   . HEART MURMUR, HX OF 11/15/2009  . HIATAL HERNIA 10/08/2009  . HIP FRACTURE, LEFT 11/14/2009  . HYPERLIPIDEMIA 11/14/2009  . HYPERTENSION 11/14/2009  . OSTEOPOROSIS 11/14/2009  . WEIGHT GAIN 01/10/2010     Social History   Socioeconomic History  . Marital status: Married    Spouse name: [name]  . Number of children: 3  . Years of education: BA  . Highest education level: Not on file  Occupational History  . Occupation: IT consultant and Spa  Tobacco Use  . Smoking status: Former Smoker    Quit date: 05/04/1982    Years since quitting: 37.9  . Smokeless tobacco: Never Used  . 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 Vaping Use: Never used  Substance and Sexual Activity  . Alcohol use: Yes    Comment: nightly  . Drug use: No  . Sexual activity: Not on file  Other Topics Concern  . Not on file  Social History Narrative   Lives with husband   Caffeine use: 1 cup or less    Right handed   Social Determinants of Health   Financial Resource Strain:   . Difficulty of Paying Living Expenses: Not on file  Food Insecurity:   .  Worried About Charity fundraiser in the Last Year: Not on file  . Ran Out of Food in the Last Year: Not on file  Transportation Needs:   . Lack of Transportation (Medical): Not on file  . Lack of Transportation (Non-Medical): Not on file  Physical Activity:   . Days of Exercise per Week: Not on file  . Minutes of Exercise per Session: Not on file  Stress:   . Feeling of Stress : Not on file  Social Connections:   . Frequency of  Communication with Friends and Family: Not on file  . Frequency of Social Gatherings with Friends and Family: Not on file  . Attends Religious Services: Not on file  . Active Member of Clubs or Organizations: Not on file  . Attends Archivist Meetings: Not on file  . Marital Status: Not on file  Intimate Partner Violence:   . Fear of Current or Ex-Partner: Not on file  . Emotionally Abused: Not on file  . Physically Abused: Not on file  . Sexually Abused: Not on file    Past Surgical History:  Procedure Laterality Date  . APPENDECTOMY    . AUGMENTATION MAMMAPLASTY Bilateral   . BACK SURGERY  2003  . Bleeding ulcers  10/2009  . Broken Hip Left 06/2001  . CATARACT EXTRACTION Bilateral 01/2017  . COLONOSCOPY N/A 12/30/2016   Procedure: COLONOSCOPY WITH PROPOFOL;  Surgeon: Leighton Ruff, MD;  Location: [facility];  Service: Endoscopy;  Laterality: N/A;  . COLONOSCOPY  2011   inadequate prep, polyps  . NECK SURGERY  2004 & 2006   x's 2  . POLYPECTOMY    . RECTAL PROLAPSE REPAIR  01/2017    Family History  Problem Relation Age of Onset  . Bone cancer Mother   . Hypertension Other   . Allergic rhinitis Neg Hx   . Asthma Neg Hx   . Urticaria Neg Hx   . Colon cancer Neg Hx   . Colon polyps Neg Hx   . Esophageal cancer Neg Hx   . Rectal cancer Neg Hx   . Stomach cancer Neg Hx     No Known Allergies  Current Outpatient Medications on File Prior to Visit  Medication Sig Dispense Refill  . albuterol (PROVENTIL) (2.5 MG/3ML) 0.083% nebulizer solution Take 3 mLs (2.5 mg total) by nebulization every 6 (six) hours as needed for wheezing or shortness of breath. 75 mL 0  . albuterol (VENTOLIN HFA) 108 (90 Base) MCG/ACT inhaler Inhale 2 puffs into the lungs every 6 (six) hours as needed for shortness of breath. 18 g 0  . CALCIUM PO Take 1 tablet by mouth daily.     . clonazePAM (KLONOPIN) 1 MG tablet Take 1 mg by mouth 2 (two) times daily.    Marland Kitchen ibandronate (BONIVA) 150 MG  tablet TAKE 1 TABLET (150 MG TOTAL) BY MOUTH EVERY 30 (THIRTY) DAYS. 3 tablet 3  . lisinopril (ZESTRIL) 20 MG tablet TAKE ONE TABLET BY MOUTH DAILY FOR BLOOD PRESSURE 90 tablet 1  . metoCLOPramide (REGLAN) 10 MG tablet TAKE 1 TAB BY MOUTH 3X/DAY FOR 3 DAYS AND AS NEEDED UP TO 3X/DAY AFTERWARDS FOR MIGRANE / NAUSEA. 90 tablet 1  . Multiple Vitamins-Minerals (MULTIVITAMIN PO) Take 1 tablet by mouth daily.     . ondansetron (ZOFRAN) 4 MG tablet TAKE 1 TABLET BY MOUTH EVERY 8 HOURS AS NEEDED FOR NAUSEA AND VOMITING 30 tablet 6  . oxyCODONE-acetaminophen (PERCOCET/ROXICET) 5-325 MG tablet Take 1 tablet  by mouth 2 (two) times daily.     Marland Kitchen VIIBRYD 20 MG TABS Take 20 mg by mouth daily.    . SUMAtriptan (IMITREX) 100 MG tablet Take by mouth.     No current facility-administered medications on file prior to visit.    BP 124/62   Pulse 65   Temp 98 F (36.7 C) (Temporal)   Ht 5' 0.5" (1.537 m)   Wt 128 lb (58.1 kg)   SpO2 97%   BMI 24.59 kg/m    Objective:   Physical Exam HENT:     Right Ear: Tympanic membrane and ear canal normal.     Left Ear: Tympanic membrane and ear canal normal.  Eyes:     Pupils: Pupils are equal, round, and reactive to light.  Cardiovascular:     Rate and Rhythm: Normal rate and regular rhythm.  Pulmonary:     Effort: Pulmonary effort is normal.     Breath sounds: Normal breath sounds.  Abdominal:     General: Bowel sounds are normal.     Palpations: Abdomen is soft.     Tenderness: There is no abdominal tenderness.  Musculoskeletal:        General: Normal range of motion.     Cervical back: Neck supple.  Skin:    General: Skin is warm and dry.  Neurological:     Mental Status: She is alert and oriented to person, place, and time.     Cranial Nerves: No cranial nerve deficit.     Deep Tendon Reflexes:     Reflex Scores:      Patellar reflexes are 2+ on the right side and 2+ on the left side. Psychiatric:        Mood and Affect: Mood normal.             Assessment & Plan:

## 2020-03-26 NOTE — Assessment & Plan Note (Signed)
Follows with pain management, continue Percocet 5-325 mg.

## 2020-03-26 NOTE — Assessment & Plan Note (Signed)
Improved on Envoy injections. Continue PRN sumatriptan 100 mg and Reglan 10 mg PRN.  Follows with neurology.

## 2020-03-26 NOTE — Assessment & Plan Note (Signed)
Compliant to Crestor 5 mg daily, continue same. Repeat lipid panel pending.

## 2020-03-26 NOTE — Assessment & Plan Note (Addendum)
No concerns. No wheezing on exam today. Continue PRN albuterol, no recent use.

## 2020-03-26 NOTE — Assessment & Plan Note (Signed)
Well controlled in the office today, continue lisinopril 20 mg daily. CMP pending.

## 2020-03-26 NOTE — Assessment & Plan Note (Signed)
Compliant to Boniva monthly, daily calcium and vitamin D. Commended her on daily weight bearing exercise.

## 2020-04-22 DIAGNOSIS — Z961 Presence of intraocular lens: Secondary | ICD-10-CM | POA: Diagnosis not present

## 2020-04-22 DIAGNOSIS — F3342 Major depressive disorder, recurrent, in full remission: Secondary | ICD-10-CM | POA: Diagnosis not present

## 2020-04-22 DIAGNOSIS — F411 Generalized anxiety disorder: Secondary | ICD-10-CM | POA: Diagnosis not present

## 2020-05-07 ENCOUNTER — Other Ambulatory Visit: Payer: Self-pay | Admitting: Primary Care

## 2020-05-07 DIAGNOSIS — I10 Essential (primary) hypertension: Secondary | ICD-10-CM

## 2020-05-08 DIAGNOSIS — F411 Generalized anxiety disorder: Secondary | ICD-10-CM | POA: Diagnosis not present

## 2020-05-08 DIAGNOSIS — F3341 Major depressive disorder, recurrent, in partial remission: Secondary | ICD-10-CM | POA: Diagnosis not present

## 2020-05-08 DIAGNOSIS — F3342 Major depressive disorder, recurrent, in full remission: Secondary | ICD-10-CM | POA: Diagnosis not present

## 2020-05-08 DIAGNOSIS — F331 Major depressive disorder, recurrent, moderate: Secondary | ICD-10-CM | POA: Diagnosis not present

## 2020-05-13 DIAGNOSIS — G894 Chronic pain syndrome: Secondary | ICD-10-CM | POA: Diagnosis not present

## 2020-05-13 DIAGNOSIS — M47812 Spondylosis without myelopathy or radiculopathy, cervical region: Secondary | ICD-10-CM | POA: Diagnosis not present

## 2020-05-13 DIAGNOSIS — Z79899 Other long term (current) drug therapy: Secondary | ICD-10-CM | POA: Diagnosis not present

## 2020-05-15 NOTE — Telephone Encounter (Signed)
We received a request to renew Ajovy PA. 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 She can tell a couple days before the injection that it is due. I let her know we would complete the PA.   Attempted PA on Cover My Meds. Received this message from plan: Available without authorization.

## 2020-05-27 NOTE — Progress Notes (Signed)
Consent Form Botulism Toxin Injection For Chronic Migraine   Interval history 05/18/2020:  100% decrease in migraine frequency and severity since starting botox. She clenches. She feels the botox wears off near the end. But in the last 2 months she hasn't had even one headache or migraine.Ask her about Ajovy* in August if she would liek to reconsider but she is doing fantastic on botox.    Prior: Can try Reglan prn and also give samples of nurtec. Imitrex did not work. She says she has gained weight, discussed diet and if she likes we can refer to [facility]. She clenches.   Consent Form Botulism Toxin Injection For Chronic Migraine    Reviewed orally with patient, additionally signature is on file:  Botulism toxin has been approved by the Federal drug administration for treatment of chronic migraine. Botulism toxin does not cure chronic migraine and it may not be effective in some patients.  The administration of botulism toxin is accomplished by injecting a small amount of toxin into the muscles of the neck and head. Dosage must be titrated for each individual. Any benefits resulting from botulism toxin tend to wear off after 3 months with a repeat injection required if benefit is to be maintained. Injections are usually done every 3-4 months with maximum effect peak achieved by about 2 or 3 weeks. Botulism toxin is expensive and you should be sure of what costs you will incur resulting from the injection.  The side effects of botulism toxin use for chronic migraine may include:   -Transient, and usually mild, facial weakness with facial injections  -Transient, and usually mild, head or neck weakness with head/neck injections  -Reduction or loss of forehead facial animation due to forehead muscle weakness  -Eyelid drooping  -Dry eye  -Pain at the site of injection or bruising at the site of injection  -Double vision  -Potential unknown long term risks  Contraindications:  You should not have Botox if you are pregnant, nursing, allergic to albumin, have an infection, skin condition, or muscle weakness at the site of the injection, or have myasthenia gravis, Lambert-Eaton syndrome, or ALS.  It is also possible that as with any injection, there may be an allergic reaction or no effect from the medication. Reduced effectiveness after repeated injections is sometimes seen and rarely infection at the injection site may occur. All care will be taken to prevent these side effects. If therapy is given over a long time, atrophy and wasting in the muscle injected may occur. Occasionally the patient's become refractory to treatment because they develop antibodies to the toxin. In this event, therapy needs to be modified.  I have read the above information and consent to the administration of botulism toxin.    BOTOX PROCEDURE NOTE FOR MIGRAINE HEADACHE    Contraindications and precautions discussed with patient(above). Aseptic procedure was observed and patient tolerated procedure. Procedure performed by Dr. Georgia Dom  The condition has existed for more than 6 months, and pt does not have a diagnosis of ALS, Myasthenia Gravis or Lambert-Eaton Syndrome.  Risks and benefits of injections discussed and pt agrees to proceed with the procedure.  Written consent obtained  These injections are medically necessary. Pt  receives good benefits from these injections. These injections do not cause sedations or hallucinations which the oral therapies may cause.  Description of procedure:  The patient was placed in a sitting position. The standard protocol was used for Botox as follows, with 5 units of  Botox injected at each site:   -Procerus muscle, midline injection  -Corrugator muscle, bilateral injection  -Frontalis muscle, bilateral injection, with 2 sites each side, medial injection was performed in the upper one third of the frontalis muscle, in the region vertical from the  medial inferior edge of the superior orbital rim. The lateral injection was again in the upper one third of the forehead vertically above the lateral limbus of the cornea, 1.5 cm lateral to the medial injection site.  -Temporalis muscle injection, 4 sites, bilaterally. The first injection was 3 cm above the tragus of the ear, second injection site was 1.5 cm to 3 cm up from the first injection site in line with the tragus of the ear. The third injection site was 1.5-3 cm forward between the first 2 injection sites. The fourth injection site was 1.5 cm posterior to the second injection site.   -Occipitalis muscle injection, 3 sites, bilaterally. The first injection was done one half way between the occipital protuberance and the tip of the mastoid process behind the ear. The second injection site was done lateral and superior to the first, 1 fingerbreadth from the first injection. The third injection site was 1 fingerbreadth superiorly and medially from the first injection site.  -Cervical paraspinal muscle injection, 2 sites, bilateral knee first injection site was 1 cm from the midline of the cervical spine, 3 cm inferior to the lower border of the occipital protuberance. The second injection site was 1.5 cm superiorly and laterally to the first injection site.  -Trapezius muscle injection was performed at 3 sites, bilaterally. The first injection site was in the upper trapezius muscle halfway between the inflection point of the neck, and the acromion. The second injection site was one half way between the acromion and the first injection site. The third injection was done between the first injection site and the inflection point of the neck.   Will return for repeat injection in 3 months.   200 units of Botox was used, any Botox not injected was wasted. The patient tolerated the procedure well, there were no complications of the above procedure.

## 2020-05-28 ENCOUNTER — Ambulatory Visit (INDEPENDENT_AMBULATORY_CARE_PROVIDER_SITE_OTHER): Payer: PPO | Admitting: Neurology

## 2020-05-28 DIAGNOSIS — G43709 Chronic migraine without aura, not intractable, without status migrainosus: Secondary | ICD-10-CM

## 2020-05-28 NOTE — Progress Notes (Signed)
Botox- 200 units x 1 vial Lot: [id number] Expiration: 02/2023 NDC: 5170-0174-94  Bacteriostatic 0.9% Sodium Chloride- 28mL total Lot: [id number] Expiration: 06/04/2021 NDC: 1638-4665-99  Dx: J57.017 B/B

## 2020-06-01 ENCOUNTER — Other Ambulatory Visit: Payer: Self-pay | Admitting: Primary Care

## 2020-06-01 DIAGNOSIS — M810 Age-related osteoporosis without current pathological fracture: Secondary | ICD-10-CM

## 2020-06-04 DIAGNOSIS — F411 Generalized anxiety disorder: Secondary | ICD-10-CM | POA: Diagnosis not present

## 2020-06-04 DIAGNOSIS — F3342 Major depressive disorder, recurrent, in full remission: Secondary | ICD-10-CM | POA: Diagnosis not present

## 2020-06-11 ENCOUNTER — Ambulatory Visit: Payer: PPO

## 2020-06-17 DIAGNOSIS — Z961 Presence of intraocular lens: Secondary | ICD-10-CM | POA: Diagnosis not present

## 2020-06-17 DIAGNOSIS — H26493 Other secondary cataract, bilateral: Secondary | ICD-10-CM | POA: Diagnosis not present

## 2020-06-17 DIAGNOSIS — H26492 Other secondary cataract, left eye: Secondary | ICD-10-CM | POA: Diagnosis not present

## 2020-06-25 ENCOUNTER — Other Ambulatory Visit: Payer: Self-pay | Admitting: Neurology

## 2020-06-25 DIAGNOSIS — Z9842 Cataract extraction status, left eye: Secondary | ICD-10-CM | POA: Diagnosis not present

## 2020-06-26 DIAGNOSIS — L821 Other seborrheic keratosis: Secondary | ICD-10-CM | POA: Diagnosis not present

## 2020-06-26 DIAGNOSIS — Z85828 Personal history of other malignant neoplasm of skin: Secondary | ICD-10-CM | POA: Diagnosis not present

## 2020-07-04 DIAGNOSIS — F411 Generalized anxiety disorder: Secondary | ICD-10-CM | POA: Diagnosis not present

## 2020-07-04 DIAGNOSIS — F3342 Major depressive disorder, recurrent, in full remission: Secondary | ICD-10-CM | POA: Diagnosis not present

## 2020-07-05 ENCOUNTER — Other Ambulatory Visit: Payer: Self-pay | Admitting: Neurology

## 2020-07-11 DIAGNOSIS — M47812 Spondylosis without myelopathy or radiculopathy, cervical region: Secondary | ICD-10-CM | POA: Diagnosis not present

## 2020-07-11 DIAGNOSIS — Z79899 Other long term (current) drug therapy: Secondary | ICD-10-CM | POA: Diagnosis not present

## 2020-07-11 DIAGNOSIS — G894 Chronic pain syndrome: Secondary | ICD-10-CM | POA: Diagnosis not present

## 2020-07-29 ENCOUNTER — Ambulatory Visit: Payer: PPO

## 2020-08-12 DIAGNOSIS — F3342 Major depressive disorder, recurrent, in full remission: Secondary | ICD-10-CM | POA: Diagnosis not present

## 2020-08-12 DIAGNOSIS — F411 Generalized anxiety disorder: Secondary | ICD-10-CM | POA: Diagnosis not present

## 2020-08-27 ENCOUNTER — Ambulatory Visit: Payer: PPO | Admitting: Neurology

## 2020-08-27 DIAGNOSIS — M62838 Other muscle spasm: Secondary | ICD-10-CM

## 2020-08-27 DIAGNOSIS — G43709 Chronic migraine without aura, not intractable, without status migrainosus: Secondary | ICD-10-CM

## 2020-08-27 DIAGNOSIS — M7918 Myalgia, other site: Secondary | ICD-10-CM

## 2020-08-27 MED ORDER — NURTEC 75 MG PO TBDP: 75.0000 mg | ORAL_TABLET | Freq: Every day | ORAL | 0 refills | Status: DC | PRN

## 2020-08-27 NOTE — Progress Notes (Signed)
Consent Form Botulism Toxin Injection For Chronic Migraine  Interval history 08/27/2020:  +a. Needs dry needling 90% decrease in migraine frequency and severity since starting botox she may get one migraine in the last 1-2 weeks as it is wearinf off. She clenches. She feels the botox wears off near the end..Ask her about Ajovy* in August 2022 if she would like to reconsider but she is doing fantastic on botox.   Needs PT and DRY NEEDLING for cervical myofascial pain, migraines and [place] tightness(bruxism) Nurtec as needed emergency(Ubrelvy did not work)  Orders Placed This Encounter  Procedures  . Ambulatory referral to Physical Therapy   Meds ordered this encounter  Medications  . Rimegepant Sulfate (NURTEC) 75 MG TBDP    Sig: Take 75 mg by mouth daily as needed. For migraines. Take as close to onset of migraine as possible. One daily maximum.    Dispense:  8 tablet    Refill:  0     Interval history 05/18/2020:  100% decrease in migraine frequency and severity since starting botox. She clenches. She feels the botox wears off near the end. But in the last 2 months she hasn't had even one headache or migraine.Ask her about Ajovy* in August if she would liek to reconsider but she is doing fantastic on botox.    Prior: Can try Reglan prn and also give samples of nurtec. Imitrex did not work. She says she has gained weight, discussed diet and if she likes [facility] can [facility]. She clenches.   Consent Form Botulism Toxin Injection For Chronic Migraine    Reviewed orally with patient, additionally signature is on file:  Botulism toxin has been approved by the Federal drug administration for treatment of chronic migraine. Botulism toxin does not cure chronic migraine and it may not be effective in some patients.  The administration of botulism toxin is accomplished by injecting a small amount of toxin into the muscles of the neck and head. Dosage must be titrated for  each individual. Any benefits resulting from botulism toxin tend to wear off after 3 months with a repeat injection required if benefit is to be maintained. Injections are usually done every 3-4 months with maximum effect peak achieved by about 2 or 3 weeks. Botulism toxin is expensive and you should be sure of what costs you will incur resulting from the injection.  The side effects of botulism toxin use for chronic migraine may include:   -Transient, and usually mild, facial weakness with facial injections  -Transient, and usually mild, head or neck weakness with head/neck injections  -Reduction or loss of forehead facial animation due to forehead muscle weakness  -Eyelid drooping  -Dry eye  -Pain at the site of injection or bruising at the site of injection  -Double vision  -Potential unknown long term risks  Contraindications: You should not have Botox if you are pregnant, nursing, allergic to albumin, have an infection, skin condition, or muscle weakness at the site of the injection, or have myasthenia gravis, Lambert-Eaton syndrome, or ALS.  It is also possible that as with any injection, there may be an allergic reaction or no effect from the medication. Reduced effectiveness after repeated injections is sometimes seen and rarely infection at the injection site may occur. All care will be taken to prevent these side effects. If therapy is given over a long time, atrophy and wasting in the muscle injected may occur. Occasionally the patient's become refractory to treatment because they  develop antibodies to the toxin. In this event, therapy needs to be modified.  I have read the above information and consent to the administration of botulism toxin.    BOTOX PROCEDURE NOTE FOR MIGRAINE HEADACHE    Contraindications and precautions discussed with patient(above). Aseptic procedure was observed and patient tolerated procedure. Procedure performed by Dr. Georgia Dom  The condition has  existed for more than 6 months, and pt does not have a diagnosis of ALS, Myasthenia Gravis or Lambert-Eaton Syndrome.  Risks and benefits of injections discussed and pt agrees to proceed with the procedure.  Written consent obtained  These injections are medically necessary. Pt  receives good benefits from these injections. These injections do not cause sedations or hallucinations which the oral therapies may cause.  Description of procedure:  The patient was placed in a sitting position. The standard protocol was used for Botox as follows, with 5 units of Botox injected at each site:   -Procerus muscle, midline injection  -Corrugator muscle, bilateral injection  -Frontalis muscle, bilateral injection, with 2 sites each side, medial injection was performed in the upper one third of the frontalis muscle, in the region vertical from the medial inferior edge of the superior orbital rim. The lateral injection was again in the upper one third of the forehead vertically above the lateral limbus of the cornea, 1.5 cm lateral to the medial injection site.  -Temporalis muscle injection, 4 sites, bilaterally. The first injection was 3 cm above the tragus of the ear, second injection site was 1.5 cm to 3 cm up from the first injection site in line with the tragus of the ear. The third injection site was 1.5-3 cm forward between the first 2 injection sites. The fourth injection site was 1.5 cm posterior to the second injection site.   -Occipitalis muscle injection, 3 sites, bilaterally. The first injection was done one half way between the occipital protuberance and the tip of the mastoid process behind the ear. The second injection site was done lateral and superior to the first, 1 fingerbreadth from the first injection. The third injection site was 1 fingerbreadth superiorly and medially from the first injection site.  -Cervical paraspinal muscle injection, 2 sites, bilateral knee first injection site was 1  cm from the midline of the cervical spine, 3 cm inferior to the lower border of the occipital protuberance. The second injection site was 1.5 cm superiorly and laterally to the first injection site.  -Trapezius muscle injection was performed at 3 sites, bilaterally. The first injection site was in the upper trapezius muscle halfway between the inflection point of the neck, and the acromion. The second injection site was one half way between the acromion and the first injection site. The third injection was done between the first injection site and the inflection point of the neck.   Will return for repeat injection in 3 months.   200 units of Botox was used, any Botox not injected was wasted. The patient tolerated the procedure well, there were no complications of the above procedure.

## 2020-08-27 NOTE — Progress Notes (Signed)
Botox- 200 units x 1 vial Lot: [id number] Expiration: 05/2023 NDC: 8003-4917-91 4 mL Bacteriostatic 0.9% Saline  Dx: T05.697 B/B

## 2020-08-27 NOTE — Patient Instructions (Signed)
PT- dry needling and PT for neck and masseters Nurtec as needed emergency  Rimegepant oral dissolving tablet What is this medicine? RIMEGEPANT (ri ME je pant) is used to treat migraine headaches with or without aura. An aura is a strange feeling or visual disturbance that warns you of an attack. It is also used to prevent migraine headaches. This medicine may be used for other purposes; ask your health care provider or pharmacist if you have questions. COMMON BRAND NAME(S): NURTEC ODT What should I tell my health care provider before I take this medicine? They need to know if you have any of these conditions:  kidney disease  liver disease  an unusual or allergic reaction to rimegepant, other medicines, foods, dyes, or preservatives  pregnant or trying to get pregnant  breast-feeding How should I use this medicine? Take the medicine by mouth. Follow the directions on the prescription label. Leave the tablet in the sealed blister pack until you are ready to take it. With dry hands, open the blister and gently remove the tablet. If the tablet breaks or crumbles, throw it away and take a new tablet out of the blister pack. Place the tablet in the mouth and allow it to dissolve, and then swallow. Do not cut, crush, or chew this medicine. You do not need water to take this medicine. Talk to your pediatrician about the use of this medicine in children. Special care may be needed. Overdosage: If you think you have taken too much of this medicine contact a poison control center or emergency room at once. NOTE: This medicine is only for you. Do not share this medicine with others. What if I miss a dose? This does not apply. This medicine is not for regular use. What may interact with this medicine? This medicine may interact with the following medications:  certain medicines for fungal infections like fluconazole, itraconazole  rifampin This list may not describe all possible interactions.  Give your health care provider a list of all the medicines, herbs, non-prescription drugs, or dietary supplements you use. Also tell them if you smoke, drink alcohol, or use illegal drugs. Some items may interact with your medicine. What should I watch for while using this medicine? Visit your health care professional for regular checks on your progress. Tell your health care professional if your symptoms do not start to get better or if they get worse. What side effects may I notice from receiving this medicine? Side effects that you should report to your doctor or health care professional as soon as possible:  allergic reactions like skin rash, itching or hives; swelling of the face, lips, or tongue Side effects that usually do not require medical attention (report these to your doctor or health care professional if they continue or are bothersome):  nausea This list may not describe all possible side effects. Call your doctor for medical advice about side effects. You may report side effects to FDA at 1-800-FDA-1088. Where should I keep my medicine? Keep out of the reach of children and pets. Store at room temperature between 20 and 25 degrees C (68 and 77 degrees F). Get rid of any unused medicine after the expiration date. To get rid of medicines that are no longer needed or have expired:  Take the medicine to a medicine take-back program. Check with your pharmacy or law enforcement to find a location.  If you cannot return the medicine, check the label or package insert to see if the medicine should  be thrown out in the garbage or flushed down the toilet. If you are not sure, ask your health care provider. If it is safe to put it in the trash, take the medicine out of the container. Mix the medicine with cat litter, dirt, coffee grounds, or other unwanted substance. Seal the mixture in a bag or container. Put it in the trash. NOTE: This sheet is a summary. It may not cover all possible  information. If you have questions about this medicine, talk to your doctor, pharmacist, or health care provider.  2021 [employer] Standard (2019-10-03 17:56:55)

## 2020-08-29 ENCOUNTER — Other Ambulatory Visit: Payer: Self-pay | Admitting: Primary Care

## 2020-08-29 DIAGNOSIS — M810 Age-related osteoporosis without current pathological fracture: Secondary | ICD-10-CM

## 2020-09-06 ENCOUNTER — Telehealth: Payer: Self-pay | Admitting: Neurology

## 2020-09-06 NOTE — Telephone Encounter (Signed)
Pt has called to report that she really likes the Nurtec and would like a prescription

## 2020-09-09 MED ORDER — RIZATRIPTAN BENZOATE 10 MG PO TBDP: 10.0000 mg | ORAL_TABLET | ORAL | 3 refills | Status: DC | PRN

## 2020-09-09 NOTE — Telephone Encounter (Signed)
Spoke with Dr Jaynee Eagles. Rx for Rizatriptan 10 mg sent to pharmacy.

## 2020-09-09 NOTE — Addendum Note (Signed)
Addended by: Gildardo Griffes on: 09/09/2020 04:44 PM   Modules accepted: Orders

## 2020-09-09 NOTE — Telephone Encounter (Signed)
I called the patient and discussed the situation with the Nurtec.  Patient understands she will need to try another triptan first.  We discussed rizatriptan she is amenable.  The sumatriptan had quit working for her in the past.  She loves how fast the Nurtec works for her.  It takes away her headache in 5 minutes. I asked her to keep [place] updated on how the rizatriptan works for her. She verbalized understanding and appreciation for the call.

## 2020-09-09 NOTE — Telephone Encounter (Signed)
You are right. Let her know we need to try another triptan before we can try and prescribe. If she is ok with it (did she have side effects to sumatriptan?) then I can try rizatriptan thanks

## 2020-09-09 NOTE — Telephone Encounter (Signed)
Pt called, I was given samples of Nurtec and it is working. I need a prescription for Nurtec. Would like a call from the nurse.

## 2020-09-09 NOTE — Telephone Encounter (Signed)
I called patient and LVM asking for call back.

## 2020-09-11 ENCOUNTER — Other Ambulatory Visit: Payer: Self-pay

## 2020-09-11 ENCOUNTER — Ambulatory Visit: Payer: PPO | Attending: Neurology

## 2020-09-11 DIAGNOSIS — F3342 Major depressive disorder, recurrent, in full remission: Secondary | ICD-10-CM | POA: Diagnosis not present

## 2020-09-11 DIAGNOSIS — R252 Cramp and spasm: Secondary | ICD-10-CM | POA: Diagnosis not present

## 2020-09-11 DIAGNOSIS — M542 Cervicalgia: Secondary | ICD-10-CM | POA: Insufficient documentation

## 2020-09-11 DIAGNOSIS — F411 Generalized anxiety disorder: Secondary | ICD-10-CM | POA: Diagnosis not present

## 2020-09-11 DIAGNOSIS — R293 Abnormal posture: Secondary | ICD-10-CM | POA: Insufficient documentation

## 2020-09-11 NOTE — Therapy (Addendum)
[facility] [address], [place], [place], [address] Phone: [phone]   Fax:  [phone]  Physical Therapy Evaluation  Patient Details  Name: [name] MRN: [id number] Date of Birth: 01/20/1951 Referring Provider (PT): Sarina Ill, MD   Encounter Date: 09/11/2020   PT End of Session - 09/11/20 1611     Visit Number 1    Date for PT Re-Evaluation 11/06/20    Authorization Type [employer]    PT Start Time 1531    PT Stop Time 1612    PT Time Calculation (min) 41 min    Activity Tolerance Patient tolerated treatment well    Behavior During Therapy [facility] for tasks assessed/performed             Past Medical History:  Diagnosis Date   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   Anxiety    Asthma    CAP (community acquired pneumonia)    Cataract 2018   removed both eyes   COLONIC POLYPS, HX OF 11/15/2009   DEPRESSION 11/14/2009   [place] ULCR UNS ACUT/CHRN W/O HEMOR PERF/OBST 11/06/2009   GERD (gastroesophageal reflux disease)    not on meds   Headache(784.0) 11/15/2009   Heart murmur    HEART MURMUR, HX OF 11/15/2009   HIATAL HERNIA 10/08/2009   HIP FRACTURE, LEFT 11/14/2009   HYPERLIPIDEMIA 11/14/2009   HYPERTENSION 11/14/2009   OSTEOPOROSIS 11/14/2009   WEIGHT GAIN 01/10/2010    Past Surgical History:  Procedure Laterality Date   APPENDECTOMY     AUGMENTATION MAMMAPLASTY Bilateral    BACK SURGERY  2003   Bleeding ulcers  10/2009   Broken Hip Left 06/2001   CATARACT EXTRACTION Bilateral 01/2017   COLONOSCOPY N/A 12/30/2016   Procedure: COLONOSCOPY WITH PROPOFOL;  Surgeon: Leighton Ruff, MD;  Location: [facility];  Service: Endoscopy;  Laterality: N/A;   COLONOSCOPY  2011   inadequate prep, polyps   NECK SURGERY  2004 & 2006   x's 2   POLYPECTOMY     RECTAL PROLAPSE REPAIR  01/2017    There were no vitals filed for this visit.    Subjective Assessment - 09/11/20 1535      Subjective Pt presents to PT with history of migraines and chronic neck pain.  Pt has been getting botox injections and has resultant muscle tension and neck pain.    Pertinent History migraines, HTN, osteoporosis, depression, cervical surgery 2004 and 2006-fusion per pt report, lumbar surgery    Diagnostic tests none    Patient Stated Goals reduce neck pain    Currently in Pain? Yes    Pain Score 7    max 7/10   Pain Location Neck    Pain Orientation Left    Pain Descriptors / Indicators Aching;Throbbing;Tightness;Squeezing    Pain Type Chronic pain    Pain Onset More than a month ago    Pain Frequency Constant    Aggravating Factors  stress, anxiety    Pain Relieving Factors nothing seems to help                [facility] PT Assessment - 09/11/20 0001       Assessment   Medical Diagnosis chronic migraine, cervical myofascial pain, masseter spasms    Referring Provider (PT) Sarina Ill, MD    Onset Date/Surgical Date --   chronic- years   Next MD Visit 3 months    Prior Therapy none      Precautions   Precautions None  Restrictions   Weight Bearing Restrictions No      Balance Screen   Has the patient fallen in the past 6 months No    Has the patient had a decrease in activity level because of a fear of falling?  No    Is the patient reluctant to leave their home because of a fear of falling?  No      Home Environment   Living Environment Private residence    Type of [place] Access Stairs to enter      Prior Function   Level of Independence Independent    Vocation Retired    Leisure needlepoint, treadmill, [employer]      Cognition   Overall Cognitive Status Within Functional Limits for tasks assessed      Observation/Other Assessments   Focus on Therapeutic Outcomes (FOTO)  47 (goal is 59)      Posture/Postural Control   Posture Comments forward head carriage, mildly kyphotic, posterior pelvic tilt      ROM / Strength   AROM / PROM / Strength  AROM;Strength;PROM      AROM   Overall AROM Comments UE A/ROM is full    AROM Assessment Site Cervical    Cervical Flexion 50    Cervical Extension 45    Cervical - Right Side Bend 35    Cervical - Left Side Bend 35    Cervical - Right Rotation 40    Cervical - Left Rotation 35      PROM   Overall PROM  --   improved ROM in supine in rotation and lateral side bending compared to AROM in sitting     Strength   Overall Strength Within functional limits for tasks performed   tested in sitting globally 4/5   Strength Assessment Site Shoulder;Elbow;Hand    Right/Left Shoulder Left;Right      Palpation   Spinal mobility reduced PA Mobility in the cervical spine secondary to fusion.  Discomfort with PA mobs in lower segments    Palpation comment tension and trigger points in bil suboccipitals, upper traps and cervical paraspinals      Ambulation/Gait   Gait Pattern Within Functional Limits                        Objective measurements completed on examination: See above findings.       [facility] PT Treatment/Exercise - 09/11/20 0001       Exercises   Exercises Shoulder      Shoulder Exercises: Seated   Retraction Right;Left;5 reps                    PT Education - 09/11/20 1602     Education Details Pt educated on proper posture in various positions and to decompress in supine to improve overall relaxation and decreased muscle tension in cervical and thoracic regions. Pt also educated on seated scapular retractions, HEP for stretching and strengthening in addition to posture interventions, importance of proper stretching technique at gentle,comfortable ranges. Access Code: [email or web address]    Person(s) Educated Patient    Methods Explanation;Demonstration;Verbal cues    Comprehension Verbalized understanding;Returned demonstration              PT Short Term Goals - 09/11/20 1702       PT SHORT TERM GOAL #1   Title be independent in initial HEP     Time 4    Period Weeks  Status New    Target Date 10/09/20      PT SHORT TERM GOAL #2   Title report a 30% reduction in neck and jaw pain with daily tasks    Time 4    Period Weeks    Status New    Target Date 10/09/20               PT Long Term Goals - 09/11/20 1703       PT LONG TERM GOAL #1   Title independent with HEP    Time 8    Period Weeks    Status New    Target Date 11/06/20      PT LONG TERM GOAL #2   Title improve Rt cervical rotation A/ROM by 10 degrees for improved function and mobility    Time 8    Period Weeks    Status New    Target Date 11/06/20      PT LONG TERM GOAL #3   Title report a 70% reduction in frequency and intensity of neck and jaw pain with daily tasks    Time 8    Period Weeks    Status New    Target Date 11/06/20      PT LONG TERM GOAL #4   Title verbalize understanding of postural awareness to decrease pain and improve function    Time 8    Period Weeks    Status New    Target Date 11/06/20      PT LONG TERM GOAL #5   Title improve FOTO to > or = to 59 to improve function    Time 8    Period Weeks    Status New    Target Date 11/06/20                    Plan - 09/11/20 1700     Clinical Impression Statement Pt presents to PT with chronic neck pain and migraines.  Pt is being treated for migraines with botox and oral medications and reported 90% reduction at her last MD visit.  Pt presents to PT with 7/10 constant neck and jaw pain today and this is most impacted by stress and anxiety.  Pt denies that anything helps with the pain.  Pt sits with forward head, thoracic kyphosis and posterior pelvic tilt.  Reduced cervical A/ROM into sidebending and rotation due to cervical fusion.  Improved cervical P/ROM in supine into rotation with pain reported at end range.  Pt with tension and trigger points in bil neck, upper traps, masseters and suboccpitals.  Pt will benefit from skilled PT to address cervical pain,  limited ROM and tension headaches.    Personal Factors and Comorbidities Comorbidity 2    Comorbidities migraines, cervical fusionx 2    Examination-Activity Limitations Lift;Other    Examination-Participation Restrictions Driving    Stability/Clinical Decision Making Stable/Uncomplicated    Clinical Decision Making Low    Rehab Potential Good    PT Frequency 2x / week    PT Duration 8 weeks    PT Treatment/Interventions ADLs/Self Care Home Management;Cryotherapy;Traction;Moist Heat;Electrical Stimulation;Functional mobility training;Therapeutic activities;Therapeutic exercise;Balance training;Neuromuscular re-education;Patient/family education;Manual techniques;Taping;Dry needling;Spinal Manipulations;Joint Manipulations    PT Next Visit Plan dry needling to cervical spine, P/ROM, movement with mobilization, postural strength    PT Home Exercise Plan Access Code: [email or web address]    Consulted and Agree with Plan of Care Patient             Patient  will benefit from skilled therapeutic intervention in order to improve the following deficits and impairments:  Pain,Postural dysfunction,Impaired flexibility,Improper body mechanics,Increased muscle spasms  Visit Diagnosis: Cervicalgia - Plan: PT plan of care cert/re-cert  Cramp and spasm - Plan: PT plan of care cert/re-cert  Abnormal posture - Plan: PT plan of care cert/re-cert     Problem List Patient Active Problem List   Diagnosis Date Noted   Decreased renal function 03/21/2019   Preventative health care 02/25/2018   Asthma 06/24/2017   Osteoarthritis 05/25/2017   Rectal prolapse 12/31/2016   Generalized abdominal pain 12/02/2016   Medicare annual wellness visit, subsequent 12/02/2016   Chronic pain 05/26/2016   Chronic migraine without aura without status migrainosus, not intractable 01/12/2016   Allergic reaction 10/22/2015   Rectal discomfort 12/01/2012   Hemorrhoids 12/01/2012   COLONIC POLYPS, HX OF 11/15/2009    Hyperlipidemia 11/14/2009   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   Anxiety and depression 11/14/2009   Essential hypertension 11/14/2009   Osteoporosis 11/14/2009   Gastrojejunal ulcer 11/06/2009   HIATAL HERNIA 10/08/2009     Sigurd Sos, PT 09/11/20 5:07 PM PHYSICAL THERAPY DISCHARGE SUMMARY  Visits from Start of Care: 1  Current functional level related to goals / functional outcomes: Pt didn't return after evaluation   Remaining deficits: See above for most current PT status.    Education / Equipment: HEP   Patient agrees to discharge. Patient goals were not met. Patient is being discharged due to not returning since the last visit. Sigurd Sos, PT 11/28/20 5:50 PM   [facility] [address] [place], [place], [address] Phone: [phone]   Fax:  [phone]  Name: Azoria Abbett MRN: [id number] Date of Birth: [birth year]

## 2020-09-11 NOTE — Patient Instructions (Signed)
Access Code: [email or web address] URL: [email or web address] Date: 09/11/2020 Prepared by: Claiborne Billings  Exercises Seated Cervical Flexion AROM - 3 x daily - 7 x weekly - 1 sets - 3 reps - 20 hold Seated Cervical Sidebending AROM - 3 x daily - 7 x weekly - 1 sets - 3 reps - 20 hold Seated Cervical Rotation AROM - 3 x daily - 7 x weekly - 1 sets - 3 reps - 20 hold Seated Correct Posture - 1 x daily - 7 x weekly - 3 sets - 10 reps

## 2020-09-16 ENCOUNTER — Ambulatory Visit
Admission: RE | Admit: 2020-09-16 | Discharge: 2020-09-16 | Disposition: A | Discharge status: Home / Self Care | Payer: PPO | Source: Ambulatory Visit | Attending: Primary Care | Admitting: Primary Care

## 2020-09-16 ENCOUNTER — Other Ambulatory Visit: Payer: Self-pay

## 2020-09-16 DIAGNOSIS — Z1231 Encounter for screening mammogram for malignant neoplasm of breast: Secondary | ICD-10-CM

## 2020-09-18 ENCOUNTER — Ambulatory Visit: Payer: PPO

## 2020-10-15 DIAGNOSIS — F411 Generalized anxiety disorder: Secondary | ICD-10-CM | POA: Diagnosis not present

## 2020-10-15 DIAGNOSIS — F3342 Major depressive disorder, recurrent, in full remission: Secondary | ICD-10-CM | POA: Diagnosis not present

## 2020-10-24 ENCOUNTER — Other Ambulatory Visit: Payer: Self-pay

## 2020-10-24 ENCOUNTER — Ambulatory Visit (INDEPENDENT_AMBULATORY_CARE_PROVIDER_SITE_OTHER): Payer: PPO

## 2020-10-24 DIAGNOSIS — Z0000 Encounter for general adult medical examination without abnormal findings: Secondary | ICD-10-CM | POA: Diagnosis not present

## 2020-10-24 NOTE — Progress Notes (Signed)
Subjective:   [name] is a 70 y.o. female who presents for Medicare Annual (Subsequent) preventive examination.  Review of Systems      I connected with the patient today by telephone and verified that I am speaking with the correct person using two identifiers. Location patient: home Location nurse: work Persons participating in the telephone visit: patient, nurse.   I discussed the limitations, risks, security and privacy concerns of performing an evaluation and management service by telephone and the availability of in person appointments. I also discussed with the patient that there may be a patient responsible charge related to this service. The patient expressed understanding and verbally consented to this telephonic visit.        Cardiac Risk Factors include: advanced age (>52men, >22 women);hypertension;Other (see comment), Risk factor comments: hyperlipidemia     Objective:    There were no vitals filed for this visit. There is no height or weight on file to calculate BMI.  Advanced Directives 10/24/2020 09/11/2020 03/16/2019 05/27/2017 01/01/2017 12/30/2016 12/23/2016  Does Patient Have a Medical Advance Directive? Yes Yes Yes Yes Yes Yes Yes  Type of Paramedic of [place];Living will [place];Living will [place];Living will [place];Living will [place];Living will [place];Living will [place];Living will  Does patient want to make changes to medical advance directive? - No - Patient declined - - No - Patient declined - No - Patient declined  Copy of Section in Chart? No - copy requested No - copy requested No - copy requested - No - copy requested No - copy requested No - copy requested    Current Medications (verified) Outpatient Encounter Medications as of 10/24/2020  Medication Sig   albuterol  (VENTOLIN HFA) 108 (90 Base) MCG/ACT inhaler Inhale into the lungs as needed for wheezing or shortness of breath.   busPIRone (BUSPAR) 5 MG tablet Take 5 mg by mouth 3 (three) times daily.   CALCIUM PO Take 1 tablet by mouth daily.    gabapentin (NEURONTIN) 100 MG capsule Take 100 mg by mouth 3 (three) times daily.   gabapentin (NEURONTIN) 300 MG capsule Take 1 capsule by mouth 3 (three) times daily.   hydrOXYzine (ATARAX/VISTARIL) 25 MG tablet Take 25 mg by mouth 3 (three) times daily as needed.   ibandronate (BONIVA) 150 MG tablet Take 1 tablet (150 mg total) by mouth every 30 (thirty) days. For bone density.   lisinopril (ZESTRIL) 20 MG tablet TAKE ONE TABLET BY MOUTH DAILY FOR BLOOD PRESSURE   Multiple Vitamins-Minerals (MULTIVITAMIN PO) Take 1 tablet by mouth daily.    QUEtiapine (SEROQUEL) 50 MG tablet Take 50 mg by mouth at bedtime.   Rimegepant Sulfate (NURTEC) 75 MG TBDP Take 75 mg by mouth daily as needed. For migraines. Take as close to onset of migraine as possible. One daily maximum.   rizatriptan (MAXALT-MLT) 10 MG disintegrating tablet Take 1 tablet (10 mg total) by mouth as needed for migraine. May repeat in 2 hours if needed. Max 2 tablets in 24 hours.   rosuvastatin (CRESTOR) 5 MG tablet TAKE 1 TABLET BY MOUTH EVERY DAY FOR CHOLESTEROL   traZODone (DESYREL) 50 MG tablet Take 1 tablet (50 mg total) by mouth at bedtime as needed for sleep.   VIIBRYD 20 MG TABS Take 40 mg by mouth daily.   No facility-administered encounter medications on file as of 10/24/2020.  Allergies (verified) Patient has no known allergies.   History: Past Medical History:  Diagnosis Date   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   Anxiety    Asthma    CAP (community acquired pneumonia)    Cataract 2018   removed both eyes   COLONIC POLYPS, HX OF 11/15/2009   DEPRESSION 11/14/2009   GASTROJEJ ULCR UNS ACUT/CHRN W/O HEMOR PERF/OBST 11/06/2009   GERD (gastroesophageal reflux disease)    not on meds    Headache(784.0) 11/15/2009   Heart murmur    HEART MURMUR, HX OF 11/15/2009   HIATAL HERNIA 10/08/2009   HIP FRACTURE, LEFT 11/14/2009   HYPERLIPIDEMIA 11/14/2009   HYPERTENSION 11/14/2009   OSTEOPOROSIS 11/14/2009   WEIGHT GAIN 01/10/2010   Past Surgical History:  Procedure Laterality Date   APPENDECTOMY     AUGMENTATION MAMMAPLASTY Bilateral    BACK SURGERY  2003   Bleeding ulcers  10/2009   Broken Hip Left 06/2001   CATARACT EXTRACTION Bilateral 01/2017   COLONOSCOPY N/A 12/30/2016   Procedure: COLONOSCOPY WITH PROPOFOL;  Surgeon: Leighton Ruff, MD;  Location: [facility];  Service: Endoscopy;  Laterality: N/A;   COLONOSCOPY  2011   inadequate prep, polyps   NECK SURGERY  2004 & 2006   x's 2   POLYPECTOMY     RECTAL PROLAPSE REPAIR  01/2017   Family History  Problem Relation Age of Onset   Bone cancer Mother    Hypertension Other    Allergic rhinitis Neg Hx    Asthma Neg Hx    Urticaria Neg Hx    Colon cancer Neg Hx    Colon polyps Neg Hx    Esophageal cancer Neg Hx    Rectal cancer Neg Hx    Stomach cancer Neg Hx    Social History   Socioeconomic History   Marital status: Married    Spouse name: [name]   Number of children: 3   Years of education: BA   Highest education level: Not on file  Occupational History   Occupation: IT consultant and Spa  Tobacco Use   Smoking status: Former    Pack years: 0.00    Types: Cigarettes    Quit date: 05/04/1982    Years since quitting: 38.5   Smokeless tobacco: Never   Tobacco comments:    Married, 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[place], 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 (stress)  Vaping Use   Vaping Use: Never used  Substance and Sexual Activity   Alcohol use: Yes    Comment: nightly   Drug use: No   Sexual activity: Not on file  Other Topics Concern   Not on file  Social History Narrative   Lives with husband   Caffeine use: 1 cup or less    Right handed   Social Determinants  of Health   Financial Resource Strain: Low Risk    Difficulty of Paying Living Expenses: Not hard at all  Food Insecurity: No Food Insecurity   Worried About Charity fundraiser in the Last Year: Never true   Ran Out of Food in the Last Year: Never true  Transportation Needs: No Transportation Needs   Lack of Transportation (Medical): No   Lack of Transportation (Non-Medical): No  Physical Activity: Sufficiently Active   Days of Exercise per Week: 6 days   Minutes of Exercise per Session: 60 min  Stress: No Stress Concern Present   Feeling of Stress : Not at all  Social  Connections: Not on file    Tobacco Counseling Counseling given: Not Answered Tobacco comments: Married, 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  No  Does the patient have any non-healing wounds?  No  Has the patient had any unintentional weight loss or weight gain?  No   Diabetes:  Is the patient diabetic?  No  If diabetic, was a CBG obtained today?   N/A Did the patient bring in their glucometer from home?   N/A How often do you monitor your CBG's? N/A.   Financial Strains and Diabetes Management:  Are you having any financial strains with the device, your supplies or your medication?  N/A .  Does the patient want to be seen by Chronic Care Management for management of their diabetes?   N/A Would the patient like to be referred to a Nutritionist or for Diabetic Management?   N/A   Interpreter Needed?: No  Information entered by :: CJohnson,  LPN   Activities of Daily Living In your present state of health, do you have any difficulty performing the following activities: 10/24/2020 03/26/2020  Hearing? N N  Vision? N N  Difficulty concentrating or making decisions? N N  Walking or climbing stairs? N N  Dressing or bathing? N N  Doing errands, shopping? N N  Preparing Food and eating ? N -  Using the Toilet? N -  In the past six months, have you accidently leaked urine? N -  Do you have problems with loss of bowel control? N -  Managing your Medications? N -  Managing your Finances? N -  Housekeeping or managing your Housekeeping? N -  Some recent data might be hidden    Patient Care Team: Pleas Koch, NP as PCP - General (Internal Medicine) Melvenia Beam, MD as Consulting Physician (Neurology) Linward Headland, NP as Nurse Practitioner (Psychology) Jeanella Anton, NP as Nurse Practitioner (Nurse Practitioner) [place] [facility]  Indicate any recent Medical Services you may have received from other than [facility] providers in the past year (date may be approximate).     Assessment:   This is a routine wellness examination for Danise.  Hearing/Vision screen Vision Screening - Comments:: Patient gets annual eye exams   Dietary issues and exercise activities discussed: Current Exercise Habits: Home exercise routine, Type of exercise: treadmill, Time (Minutes): 60, Frequency (Times/Week): 6, Weekly Exercise (Minutes/Week): 360, Intensity: Moderate, Exercise limited by: None identified   Goals Addressed             This Visit's Progress    Patient Stated       10/24/2020, I will continue to walk on my treadmill 6 days a week for 1 hour         Depression Screen PHQ 2/9 Scores 10/24/2020 03/26/2020 03/16/2019 01/06/2017 12/02/2016  PHQ - 2 Score 0 0 0 0 0  PHQ- 9 Score 0 0 0 - -    Fall Risk Fall Risk  10/24/2020 03/26/2020 03/29/2019 03/16/2019  Falls in the past  year? 0 0 0 0  Comment -  - Emmi Telephone Survey: data to providers prior to load -  Number falls in past yr: 0 0 - 0  Injury with Fall? 0 0 - 0  Risk for fall due to : Medication side effect - - Medication side effect  Follow up Falls evaluation completed;Falls prevention discussed - - Falls evaluation completed;Falls prevention discussed    FALL RISK PREVENTION PERTAINING TO THE HOME:  Any stairs in or around the home? Yes  If so, are there any without handrails? No  Home free of loose throw rugs in walkways, pet beds, electrical cords, etc? Yes  Adequate lighting in your home to reduce risk of falls? Yes   ASSISTIVE DEVICES UTILIZED TO PREVENT FALLS:  Life alert? No  Use of a cane, walker or w/c? No  Grab bars in the bathroom? No  Shower chair or bench in shower? No  Elevated toilet seat or a handicapped toilet? No   TIMED UP AND GO:  Was the test performed?  N/A telephone visit .  Cognitive Function: MMSE - Mini Mental State Exam 10/24/2020 03/16/2019  Not completed: Refused -  Orientation to time - 5  Orientation to Place - 5  Registration - 3  Attention/ Calculation - 5  Recall - 3  Language- repeat - 1  Mini Cog  Mini-Cog screen was not completed. Patient refused. Maximum score is 22. A value of 0 denotes this part of the MMSE was not completed or the patient failed this part of the Mini-Cog screening.       Immunizations Immunization History  Administered Date(s) Administered   Influenza, High Dose Seasonal PF 01/13/2017   Influenza,inj,Quad PF,6+ Mos 12/07/2018   Influenza-Unspecified 02/03/2018, 01/01/2019, 02/07/2020   PFIZER(Purple Top)SARS-COV-2 Vaccination 05/29/2019, 06/19/2019, 01/27/2020, 08/20/2020   Pneumococcal Conjugate-13 12/20/2015   Pneumococcal Polysaccharide-23 01/02/2017   Tdap 08/29/2012   Zoster Recombinat (Shingrix) 11/19/2016, 04/18/2017   Zoster, Live 02/28/2013    TDAP status: Up to date  Flu Vaccine status: Up to date  Pneumococcal vaccine status: Up  to date  Covid-19 vaccine status: Completed vaccines  Qualifies for Shingles Vaccine? Yes   Zostavax completed Yes   Shingrix Completed?: Yes  Screening Tests Health Maintenance  Topic Date Due   INFLUENZA VACCINE  12/02/2020   COVID-19 Vaccine (5 - Booster for [place] series) 12/20/2020   COLONOSCOPY (Pts 45-26yrs Insurance coverage will need to be confirmed)  12/28/2020   TETANUS/TDAP  08/30/2022   MAMMOGRAM  09/17/2022   DEXA SCAN  Completed   Hepatitis C Screening  Completed   PNA vac Low Risk Adult  Completed   Zoster Vaccines- Shingrix  Completed   HPV VACCINES  Aged Out    Health Maintenance  There are no preventive care reminders to display for this patient.   Colorectal cancer screening: Type of screening: Colonoscopy. Completed 12/29/2019. Repeat every 1 years  Mammogram status: Completed 09/16/2020. Repeat every year  Bone Density status: Completed 06/16/2019. Results reflect: Bone density results: OSTEOPENIA. Repeat every 2 years.  Lung Cancer Screening: (Low Dose CT Chest recommended if Age 7-80 years, 30 pack-year currently smoking OR have quit w/in 15years.) does not qualify.   Additional Screening:  Hepatitis C Screening: does qualify; Completed 03/17/2019  Vision Screening: Recommended annual ophthalmology exams for early detection of glaucoma and other disorders of the eye. Is the patient up to date with their annual eye exam?  Yes  Who is the provider or what is the  name of the office in which the patient attends annual eye exams? Dr. Heather [place] If pt is not established with a provider, would they like to be referred to a provider to establish care? No .   Dental Screening: Recommended annual dental exams for proper oral hygiene  Community Resource Referral / Chronic Care Management: CRR required this visit?  No   CCM required this visit?  No      Plan:     I have personally reviewed and noted the following in the patient's chart:   Medical  and social history Use of alcohol, tobacco or illicit drugs  Current medications and supplements including opioid prescriptions.  Functional ability and status Nutritional status Physical activity Advanced directives List of other physicians Hospitalizations, surgeries, and ER visits in previous 12 months Vitals Screenings to include cognitive, depression, and falls Referrals and appointments  In addition, I have reviewed and discussed with patient certain preventive protocols, quality metrics, and best practice recommendations. A written personalized care plan for preventive services as well as general preventive health recommendations were provided to patient.   Due to this being a telephonic visit, the after visit summary with patients personalized plan was offered to patient via office or my-chart. Patient preferred to pick up at office at next visit or via mychart.   Andrez Grime, LPN   7/53/0051

## 2020-10-24 NOTE — Patient Instructions (Signed)
[name] , Thank you for taking time to come for your Medicare Wellness Visit. I appreciate your ongoing commitment to your health goals. Please review the following plan we discussed and let me know if I can assist you in the future.   Screening recommendations/referrals: Colonoscopy: Up to date, completed 12/29/2019, due 12/2020 Mammogram: Up to date, completed 09/16/2020, due 09/2021 Bone Density: Up to date, completed 06/16/2019, due 06/2021 Recommended yearly ophthalmology/optometry visit for glaucoma screening and checkup Recommended yearly dental visit for hygiene and checkup  Vaccinations: Influenza vaccine: Up to date, completed 02/07/2020, due 12/2020 Pneumococcal vaccine: Completed series Tdap vaccine: Up to date, completed 08/29/2012, due 08/2022 Shingles vaccine: Completed series   Covid-19:Completed series  Advanced directives: Please bring a copy of your POA (Power of [place]) and/or Living Will to your next appointment.   Conditions/risks identified: hypertension, hyperlipidemia   Next appointment: Follow up in one year for your annual wellness visit    Preventive Care 34 Years and Older, Female Preventive care refers to lifestyle choices and visits with your health care provider that can promote health and wellness. What does preventive care include? A yearly physical exam. This is also called an annual well check. Dental exams once or twice a year. Routine eye exams. Ask your health care provider how often you should have your eyes checked. Personal lifestyle choices, including: Daily care of your teeth and gums. Regular physical activity. Eating a healthy diet. Avoiding tobacco and drug use. Limiting alcohol use. Practicing safe sex. Taking low-dose aspirin every day. Taking vitamin and mineral supplements as recommended by your health care provider. What happens during an annual well check? The services and screenings done by your health care provider during  your annual well check will depend on your age, overall health, lifestyle risk factors, and family history of disease. Counseling  Your health care provider may ask you questions about your: Alcohol use. Tobacco use. Drug use. Emotional well-being. Home and relationship well-being. Sexual activity. Eating habits. History of falls. Memory and ability to understand (cognition). Work and work Statistician. Reproductive health. Screening  You may have the following tests or measurements: Height, weight, and BMI. Blood pressure. Lipid and cholesterol levels. These may be checked every 5 years, or more frequently if you are over 61 years old. Skin check. Lung cancer screening. You may have this screening every year starting at age 31 if you have a 30-pack-year history of smoking and currently smoke or have quit within the past 15 years. Fecal occult blood test (FOBT) of the stool. You may have this test every year starting at age 56. Flexible sigmoidoscopy or colonoscopy. You may have a sigmoidoscopy every 5 years or a colonoscopy every 10 years starting at age 71. Hepatitis C blood test. Hepatitis B blood test. Sexually transmitted disease (STD) testing. Diabetes screening. This is done by checking your blood sugar (glucose) after you have not eaten for a while (fasting). You may have this done every 1-3 years. Bone density scan. This is done to screen for osteoporosis. You may have this done starting at age 14. Mammogram. This may be done every 1-2 years. Talk to your health care provider about how often you should have regular mammograms. Talk with your health care provider about your test results, treatment options, and if necessary, the need for more tests. Vaccines  Your health care provider may recommend certain vaccines, such as: Influenza vaccine. This is recommended every year. Tetanus, diphtheria, and acellular pertussis (Tdap, Td) vaccine. You  may need a Td booster every 10  years. Zoster vaccine. You may need this after age 35. Pneumococcal 13-valent conjugate (PCV13) vaccine. One dose is recommended after age 69. Pneumococcal polysaccharide (PPSV23) vaccine. One dose is recommended after age 4. Talk to your health care provider about which screenings and vaccines you need and how often you need them. This information is not intended to replace advice given to you by your health care provider. Make sure you discuss any questions you have with your health care provider. Document Released: 05/17/2015 Document Revised: 01/08/2016 Document Reviewed: 02/19/2015 Elsevier Interactive Patient Education  2017 [employer] Prevention in the Home Falls can cause injuries. They can happen to people of all ages. There are many things you can do to make your home safe and to help prevent falls. What can I do on the outside of my home? Regularly fix the edges of walkways and driveways and fix any cracks. Remove anything that might make you trip as you walk through a door, such as a raised step or threshold. Trim any bushes or trees on the path to your home. Use bright outdoor lighting. Clear any walking paths of anything that might make someone trip, such as rocks or tools. Regularly check to see if handrails are loose or broken. Make sure that both sides of any steps have handrails. Any raised decks and porches should have guardrails on the edges. Have any leaves, snow, or ice cleared regularly. Use sand or salt on walking paths during winter. Clean up any spills in your garage right away. This includes oil or grease spills. What can I do in the bathroom? Use night lights. Install grab bars by the toilet and in the tub and shower. Do not use towel bars as grab bars. Use non-skid mats or decals in the tub or shower. If you need to sit down in the shower, use a plastic, non-slip stool. Keep the floor dry. Clean up any water that spills on the floor as soon as it  happens. Remove soap buildup in the tub or shower regularly. Attach bath mats securely with double-sided non-slip rug tape. Do not have throw rugs and other things on the floor that can make you trip. What can I do in the bedroom? Use night lights. Make sure that you have a light by your bed that is easy to reach. Do not use any sheets or blankets that are too big for your bed. They should not hang down onto the floor. Have a firm chair that has side arms. You can use this for support while you get dressed. Do not have throw rugs and other things on the floor that can make you trip. What can I do in the kitchen? Clean up any spills right away. Avoid walking on wet floors. Keep items that you use a lot in easy-to-reach places. If you need to reach something above you, use a strong step stool that has a grab bar. Keep electrical cords out of the way. Do not use floor polish or wax that makes floors slippery. If you must use wax, use non-skid floor wax. Do not have throw rugs and other things on the floor that can make you trip. What can I do with my stairs? Do not leave any items on the stairs. Make sure that there are handrails on both sides of the stairs and use them. Fix handrails that are broken or loose. Make sure that handrails are as long as the  stairways. Check any carpeting to make sure that it is firmly attached to the stairs. Fix any carpet that is loose or worn. Avoid having throw rugs at the top or bottom of the stairs. If you do have throw rugs, attach them to the floor with carpet tape. Make sure that you have a light switch at the top of the stairs and the bottom of the stairs. If you do not have them, ask someone to add them for you. What else can I do to help prevent falls? Wear shoes that: Do not have high heels. Have rubber bottoms. Are comfortable and fit you well. Are closed at the toe. Do not wear sandals. If you use a stepladder: Make sure that it is fully opened.  Do not climb a closed stepladder. Make sure that both sides of the stepladder are locked into place. Ask someone to hold it for you, if possible. Clearly mark and make sure that you can see: Any grab bars or handrails. First and last steps. Where the edge of each step is. Use tools that help you move around (mobility aids) if they are needed. These include: Canes. Walkers. Scooters. Crutches. Turn on the lights when you go into a dark area. Replace any light bulbs as soon as they burn out. Set up your furniture so you have a clear path. Avoid moving your furniture around. If any of your floors are uneven, fix them. If there are any pets around you, be aware of where they are. Review your medicines with your doctor. Some medicines can make you feel dizzy. This can increase your chance of falling. Ask your doctor what other things that you can do to help prevent falls. This information is not intended to replace advice given to you by your health care provider. Make sure you discuss any questions you have with your health care provider. Document Released: 02/14/2009 Document Revised: 09/26/2015 Document Reviewed: 05/25/2014 Elsevier Interactive Patient Education  2017 [employer].

## 2020-10-24 NOTE — Progress Notes (Signed)
PCP notes:  Health Maintenance: No gaps noted    Abnormal Screenings: none   Patient concerns: none   Nurse concerns: none   Next PCP appt: none 

## 2020-10-26 ENCOUNTER — Other Ambulatory Visit: Payer: Self-pay | Admitting: Primary Care

## 2020-10-26 DIAGNOSIS — I10 Essential (primary) hypertension: Secondary | ICD-10-CM

## 2020-11-14 DIAGNOSIS — F3342 Major depressive disorder, recurrent, in full remission: Secondary | ICD-10-CM | POA: Diagnosis not present

## 2020-11-14 DIAGNOSIS — F411 Generalized anxiety disorder: Secondary | ICD-10-CM | POA: Diagnosis not present

## 2020-11-27 ENCOUNTER — Encounter: Payer: Self-pay | Admitting: Neurology

## 2020-11-27 ENCOUNTER — Ambulatory Visit: Payer: PPO | Admitting: Neurology

## 2020-11-27 VITALS — BP 121/85 | HR 106 | Ht 61.0 in | Wt 124.0 lb

## 2020-11-27 DIAGNOSIS — G43709 Chronic migraine without aura, not intractable, without status migrainosus: Secondary | ICD-10-CM | POA: Diagnosis not present

## 2020-11-27 NOTE — Progress Notes (Signed)
Botox- 200 units x 1 vial Lot: [id number] Expiration: 05/2023 NDC: [id number]  Bacteriostatic 0.9% Sodium Chloride- 38m total Lot: [id number]: 05/04/2022 NDC: [id number] Dx: [id number]

## 2020-11-27 NOTE — Progress Notes (Signed)
Consent Form Botulism Toxin Injection For Chronic Migraine  Stable: botox has "changed my life". +5 each masseter, +5 each orb oculi. Interval history 08/27/2020:  +a. Needs dry needling 90% decrease in migraine frequency and severity since starting botox she may get one migraine in the last 1-2 weeks as it is wearinf off. She clenches. She feels the botox wears off near the end..Ask her about Ajovy* in August 2022 if she would like to reconsider but she is doing fantastic on botox.   Needs PT and DRY NEEDLING for cervical myofascial pain, migraines and Masseter tightness(bruxism) Nurtec as needed emergency(Ubrelvy did not work)  No orders of the defined types were placed in this encounter.  No orders of the defined types were placed in this encounter.    Interval history 05/18/2020:  100% decrease in migraine frequency and severity since starting botox. She clenches. She feels the botox wears off near the end. But in the last 2 months she hasn't had even one headache or migraine.Ask her about Ajovy* in August if she would liek to reconsider but she is doing fantastic on botox.    Prior: Can try Reglan prn and also give samples of nurtec. Imitrex did not work. She says she has gained weight, discussed diet and if she likes we can refer to [facility]. She clenches.   Consent Form Botulism Toxin Injection For Chronic Migraine    Reviewed orally with patient, additionally signature is on file:  Botulism toxin has been approved by the Federal drug administration for treatment of chronic migraine. Botulism toxin does not cure chronic migraine and it may not be effective in some patients.  The administration of botulism toxin is accomplished by injecting a small amount of toxin into the muscles of the neck and head. Dosage must be titrated for each individual. Any benefits resulting from botulism toxin tend to wear off after 3 months with a repeat injection required if benefit is  to be maintained. Injections are usually done every 3-4 months with maximum effect peak achieved by about 2 or 3 weeks. Botulism toxin is expensive and you should be sure of what costs you will incur resulting from the injection.  The side effects of botulism toxin use for chronic migraine may include:   -Transient, and usually mild, facial weakness with facial injections  -Transient, and usually mild, head or neck weakness with head/neck injections  -Reduction or loss of forehead facial animation due to forehead muscle weakness  -Eyelid drooping  -Dry eye  -Pain at the site of injection or bruising at the site of injection  -Double vision  -Potential unknown long term risks  Contraindications: You should not have Botox if you are pregnant, nursing, allergic to albumin, have an infection, skin condition, or muscle weakness at the site of the injection, or have myasthenia gravis, Lambert-Eaton syndrome, or ALS.  It is also possible that as with any injection, there may be an allergic reaction or no effect from the medication. Reduced effectiveness after repeated injections is sometimes seen and rarely infection at the injection site may occur. All care will be taken to prevent these side effects. If therapy is given over a long time, atrophy and wasting in the muscle injected may occur. Occasionally the patient's become refractory to treatment because they develop antibodies to the toxin. In this event, therapy needs to be modified.  I have read the above information and consent to the administration of botulism toxin.    BOTOX PROCEDURE  NOTE FOR MIGRAINE HEADACHE    Contraindications and precautions discussed with patient(above). Aseptic procedure was observed and patient tolerated procedure. Procedure performed by Dr. Georgia Dom  The condition has existed for more than 6 months, and pt does not have a diagnosis of ALS, Myasthenia Gravis or Lambert-Eaton Syndrome.  Risks and benefits of  injections discussed and pt agrees to proceed with the procedure.  Written consent obtained  These injections are medically necessary. Pt  receives good benefits from these injections. These injections do not cause sedations or hallucinations which the oral therapies may cause.  Description of procedure:  The patient was placed in a sitting position. The standard protocol was used for Botox as follows, with 5 units of Botox injected at each site:   -Procerus muscle, midline injection  -Corrugator muscle, bilateral injection  -Frontalis muscle, bilateral injection, with 2 sites each side, medial injection was performed in the upper one third of the frontalis muscle, in the region vertical from the medial inferior edge of the superior orbital rim. The lateral injection was again in the upper one third of the forehead vertically above the lateral limbus of the cornea, 1.5 cm lateral to the medial injection site.  -Temporalis muscle injection, 4 sites, bilaterally. The first injection was 3 cm above the tragus of the ear, second injection site was 1.5 cm to 3 cm up from the first injection site in line with the tragus of the ear. The third injection site was 1.5-3 cm forward between the first 2 injection sites. The fourth injection site was 1.5 cm posterior to the second injection site.   -Occipitalis muscle injection, 3 sites, bilaterally. The first injection was done one half way between the occipital protuberance and the tip of the mastoid process behind the ear. The second injection site was done lateral and superior to the first, 1 fingerbreadth from the first injection. The third injection site was 1 fingerbreadth superiorly and medially from the first injection site.  -Cervical paraspinal muscle injection, 2 sites, bilateral knee first injection site was 1 cm from the midline of the cervical spine, 3 cm inferior to the lower border of the occipital protuberance. The second injection site was  1.5 cm superiorly and laterally to the first injection site.  -Trapezius muscle injection was performed at 3 sites, bilaterally. The first injection site was in the upper trapezius muscle halfway between the inflection point of the neck, and the acromion. The second injection site was one half way between the acromion and the first injection site. The third injection was done between the first injection site and the inflection point of the neck.   Will return for repeat injection in 3 months.   200 units of Botox was used, any Botox not injected was wasted. The patient tolerated the procedure well, there were no complications of the above procedure.

## 2020-12-12 DIAGNOSIS — C44719 Basal cell carcinoma of skin of left lower limb, including hip: Secondary | ICD-10-CM | POA: Diagnosis not present

## 2020-12-12 DIAGNOSIS — Z85828 Personal history of other malignant neoplasm of skin: Secondary | ICD-10-CM | POA: Diagnosis not present

## 2020-12-12 DIAGNOSIS — L814 Other melanin hyperpigmentation: Secondary | ICD-10-CM | POA: Diagnosis not present

## 2020-12-12 DIAGNOSIS — C44612 Basal cell carcinoma of skin of right upper limb, including shoulder: Secondary | ICD-10-CM | POA: Diagnosis not present

## 2020-12-12 DIAGNOSIS — D2272 Melanocytic nevi of left lower limb, including hip: Secondary | ICD-10-CM | POA: Diagnosis not present

## 2020-12-12 DIAGNOSIS — D225 Melanocytic nevi of trunk: Secondary | ICD-10-CM | POA: Diagnosis not present

## 2020-12-12 DIAGNOSIS — D2271 Melanocytic nevi of right lower limb, including hip: Secondary | ICD-10-CM | POA: Diagnosis not present

## 2020-12-12 DIAGNOSIS — L821 Other seborrheic keratosis: Secondary | ICD-10-CM | POA: Diagnosis not present

## 2020-12-26 DIAGNOSIS — F3342 Major depressive disorder, recurrent, in full remission: Secondary | ICD-10-CM | POA: Diagnosis not present

## 2020-12-26 DIAGNOSIS — F411 Generalized anxiety disorder: Secondary | ICD-10-CM | POA: Diagnosis not present

## 2021-01-13 DIAGNOSIS — Z85828 Personal history of other malignant neoplasm of skin: Secondary | ICD-10-CM | POA: Diagnosis not present

## 2021-01-13 DIAGNOSIS — C44719 Basal cell carcinoma of skin of left lower limb, including hip: Secondary | ICD-10-CM | POA: Diagnosis not present

## 2021-01-13 DIAGNOSIS — C44612 Basal cell carcinoma of skin of right upper limb, including shoulder: Secondary | ICD-10-CM | POA: Diagnosis not present

## 2021-01-27 DIAGNOSIS — F411 Generalized anxiety disorder: Secondary | ICD-10-CM | POA: Diagnosis not present

## 2021-01-27 DIAGNOSIS — F410 Panic disorder [episodic paroxysmal anxiety] without agoraphobia: Secondary | ICD-10-CM | POA: Diagnosis not present

## 2021-01-27 DIAGNOSIS — F3342 Major depressive disorder, recurrent, in full remission: Secondary | ICD-10-CM | POA: Diagnosis not present

## 2021-01-28 DIAGNOSIS — Z79899 Other long term (current) drug therapy: Secondary | ICD-10-CM | POA: Diagnosis not present

## 2021-01-28 DIAGNOSIS — F411 Generalized anxiety disorder: Secondary | ICD-10-CM | POA: Diagnosis not present

## 2021-01-28 DIAGNOSIS — F410 Panic disorder [episodic paroxysmal anxiety] without agoraphobia: Secondary | ICD-10-CM | POA: Diagnosis not present

## 2021-02-05 DIAGNOSIS — F32A Depression, unspecified: Secondary | ICD-10-CM

## 2021-02-05 DIAGNOSIS — F419 Anxiety disorder, unspecified: Secondary | ICD-10-CM

## 2021-02-21 ENCOUNTER — Other Ambulatory Visit: Payer: Self-pay

## 2021-02-21 ENCOUNTER — Ambulatory Visit (AMBULATORY_SURGERY_CENTER): Payer: PPO | Admitting: *Deleted

## 2021-02-21 ENCOUNTER — Encounter: Payer: Self-pay | Admitting: Gastroenterology

## 2021-02-21 VITALS — Ht 60.5 in | Wt 122.0 lb

## 2021-02-21 DIAGNOSIS — Z86010 Personal history of colonic polyps: Secondary | ICD-10-CM

## 2021-02-21 MED ORDER — PLENVU 140 G PO SOLR: 1.0000 | Freq: Once | ORAL | 0 refills | Status: AC

## 2021-02-21 NOTE — Progress Notes (Signed)
Patient's pre-visit was done today over the phone with the patient due to COVID-19 pandemic. Name,DOB and address verified. Patient denies any allergies to Eggs and Soy. Patient denies any problems with anesthesia/sedation. Patient is not taking any diet pills or blood thinners. No home Oxygen. Packet of Prep instructions mailed to patient including a copy of a consent form-pt is aware. Patient understands to call us back with any questions or concerns. Patient is aware of our care-partner policy and [id number] safety protocol. Patient states she did not use the Miralax with her prep last time. She states she does not need this-denies constipation. She said 2018 she ate that is why she was not cleaned out. Pt wants to use Plenvu this time-coupon mailed-pt aware of cost.  [facility] education assigned to the patient for the procedure, sent to [place].   The patient is COVID-19 vaccinated.

## 2021-02-24 ENCOUNTER — Ambulatory Visit: Payer: PPO | Admitting: Neurology

## 2021-02-26 NOTE — Progress Notes (Deleted)
02/26/21 ALL: She returns for Botox. Rizatriptan helps with abortive needs. Masseter injections helpful for clenching.   11/27/2020 AA: Stable: botox has "changed my life". +5 each masseter, +5 each orb oculi.  08/27/2020 AA:  +a. Needs dry needling 90% decrease in migraine frequency and severity since starting botox she may get one migraine in the last 1-2 weeks as it is wearinf off. She clenches. She feels the botox wears off near the end..Ask her about Ajovy* in August 2022 if she would like to reconsider but she is doing fantastic on botox.   Consent Form Botulism Toxin Injection For Chronic Migraine   Reviewed orally with patient, additionally signature is on file:  Botulism toxin has been approved by the Federal drug administration for treatment of chronic migraine. Botulism toxin does not cure chronic migraine and it may not be effective in some patients.  The administration of botulism toxin is accomplished by injecting a small amount of toxin into the muscles of the neck and head. Dosage must be titrated for each individual. Any benefits resulting from botulism toxin tend to wear off after 3 months with a repeat injection required if benefit is to be maintained. Injections are usually done every 3-4 months with maximum effect peak achieved by about 2 or 3 weeks. Botulism toxin is expensive and you should be sure of what costs you will incur resulting from the injection.  The side effects of botulism toxin use for chronic migraine may include:   -Transient, and usually mild, facial weakness with facial injections  -Transient, and usually mild, head or neck weakness with head/neck injections  -Reduction or loss of forehead facial animation due to forehead muscle weakness  -Eyelid drooping  -Dry eye  -Pain at the site of injection or bruising at the site of injection  -Double vision  -Potential unknown long term risks   Contraindications: You should not have Botox if you are  pregnant, nursing, allergic to albumin, have an infection, skin condition, or muscle weakness at the site of the injection, or have myasthenia gravis, Lambert-Eaton syndrome, or ALS.  It is also possible that as with any injection, there may be an allergic reaction or no effect from the medication. Reduced effectiveness after repeated injections is sometimes seen and rarely infection at the injection site may occur. All care will be taken to prevent these side effects. If therapy is given over a long time, atrophy and wasting in the muscle injected may occur. Occasionally the patient's become refractory to treatment because they develop antibodies to the toxin. In this event, therapy needs to be modified.  I have read the above information and consent to the administration of botulism toxin.    BOTOX PROCEDURE NOTE FOR MIGRAINE HEADACHE  Contraindications and precautions discussed with patient(above). Aseptic procedure was observed and patient tolerated procedure. Procedure performed by Debbora Presto, FNP-C.   The condition has existed for more than 6 months, and pt does not have a diagnosis of ALS, Myasthenia Gravis or Lambert-Eaton Syndrome.  Risks and benefits of injections discussed and pt agrees to proceed with the procedure.  Written consent obtained  These injections are medically necessary. Pt  receives good benefits from these injections. These injections do not cause sedations or hallucinations which the oral therapies may cause.   Description of procedure:  The patient was placed in a sitting position. The standard protocol was used for Botox as follows, with 5 units of Botox injected at each site:  -Procerus muscle, midline  injection  -Corrugator muscle, bilateral injection  -Frontalis muscle, bilateral injection, with 2 sites each side, medial injection was performed in the upper one third of the frontalis muscle, in the region vertical from the medial inferior edge of the superior  orbital rim. The lateral injection was again in the upper one third of the forehead vertically above the lateral limbus of the cornea, 1.5 cm lateral to the medial injection site.  -Temporalis muscle injection, 4 sites, bilaterally. The first injection was 3 cm above the tragus of the ear, second injection site was 1.5 cm to 3 cm up from the first injection site in line with the tragus of the ear. The third injection site was 1.5-3 cm forward between the first 2 injection sites. The fourth injection site was 1.5 cm posterior to the second injection site. 5th site laterally in the temporalis  muscleat the level of the outer canthus.  -Occipitalis muscle injection, 3 sites, bilaterally. The first injection was done one half way between the occipital protuberance and the tip of the mastoid process behind the ear. The second injection site was done lateral and superior to the first, 1 fingerbreadth from the first injection. The third injection site was 1 fingerbreadth superiorly and medially from the first injection site.  -Cervical paraspinal muscle injection, 2 sites, bilaterally. The first injection site was 1 cm from the midline of the cervical spine, 3 cm inferior to the lower border of the occipital protuberance. The second injection site was 1.5 cm superiorly and laterally to the first injection site.  -Trapezius muscle injection was performed at 3 sites, bilaterally. The first injection site was in the upper trapezius muscle halfway between the inflection point of the neck, and the acromion. The second injection site was one half way between the acromion and the first injection site. The third injection was done between the first injection site and the inflection point of the neck.   Will return for repeat injection in 3 months.   A total of 200 units of Botox was prepared, 155 units of Botox was injected as documented above, any Botox not injected was wasted. The patient tolerated the procedure  well, there were no complications of the above procedure.

## 2021-02-27 ENCOUNTER — Ambulatory Visit: Payer: PPO | Admitting: Neurology

## 2021-02-27 ENCOUNTER — Ambulatory Visit: Payer: Self-pay | Admitting: Adult Health

## 2021-03-03 ENCOUNTER — Ambulatory Visit: Payer: PPO | Admitting: Family Medicine

## 2021-03-03 DIAGNOSIS — F419 Anxiety disorder, unspecified: Secondary | ICD-10-CM | POA: Diagnosis not present

## 2021-03-05 ENCOUNTER — Ambulatory Visit: Payer: PPO | Admitting: Family Medicine

## 2021-03-05 DIAGNOSIS — G43709 Chronic migraine without aura, not intractable, without status migrainosus: Secondary | ICD-10-CM

## 2021-03-05 NOTE — Progress Notes (Signed)
03/05/21 ALL: She returns for Botox. Rizatriptan helps with abortive needs. She has not had a headache since last procedure. She requests masseter and orb oculi injections as they help with clenching and retro orbital eye pain.   11/27/2020 AA: Stable: botox has "changed my life". +5 each masseter, +5 each orb oculi.  08/27/2020 AA:  +a. Needs dry needling 90% decrease in migraine frequency and severity since starting botox she may get one migraine in the last 1-2 weeks as it is wearinf off. She clenches. She feels the botox wears off near the end..Ask her about Ajovy* in August 2022 if she would like to reconsider but she is doing fantastic on botox.   Consent Form Botulism Toxin Injection For Chronic Migraine   Reviewed orally with patient, additionally signature is on file:  Botulism toxin has been approved by the Federal drug administration for treatment of chronic migraine. Botulism toxin does not cure chronic migraine and it may not be effective in some patients.  The administration of botulism toxin is accomplished by injecting a small amount of toxin into the muscles of the neck and head. Dosage must be titrated for each individual. Any benefits resulting from botulism toxin tend to wear off after 3 months with a repeat injection required if benefit is to be maintained. Injections are usually done every 3-4 months with maximum effect peak achieved by about 2 or 3 weeks. Botulism toxin is expensive and you should be sure of what costs you will incur resulting from the injection.  The side effects of botulism toxin use for chronic migraine may include:   -Transient, and usually mild, facial weakness with facial injections  -Transient, and usually mild, head or neck weakness with head/neck injections  -Reduction or loss of forehead facial animation due to forehead muscle weakness  -Eyelid drooping  -Dry eye  -Pain at the site of injection or bruising at the site of  injection  -Double vision  -Potential unknown long term risks   Contraindications: You should not have Botox if you are pregnant, nursing, allergic to albumin, have an infection, skin condition, or muscle weakness at the site of the injection, or have myasthenia gravis, Lambert-Eaton syndrome, or ALS.  It is also possible that as with any injection, there may be an allergic reaction or no effect from the medication. Reduced effectiveness after repeated injections is sometimes seen and rarely infection at the injection site may occur. All care will be taken to prevent these side effects. If therapy is given over a long time, atrophy and wasting in the muscle injected may occur. Occasionally the patient's become refractory to treatment because they develop antibodies to the toxin. In this event, therapy needs to be modified.  I have read the above information and consent to the administration of botulism toxin.    BOTOX PROCEDURE NOTE FOR MIGRAINE HEADACHE  Contraindications and precautions discussed with patient(above). Aseptic procedure was observed and patient tolerated procedure. Procedure performed by Debbora Presto, FNP-C.   The condition has existed for more than 6 months, and pt does not have a diagnosis of ALS, Myasthenia Gravis or Lambert-Eaton Syndrome.  Risks and benefits of injections discussed and pt agrees to proceed with the procedure.  Written consent obtained  These injections are medically necessary. Pt  receives good benefits from these injections. These injections do not cause sedations or hallucinations which the oral therapies may cause.   Description of procedure:  The patient was placed in a sitting position. The  standard protocol was used for Botox as follows, with 5 units of Botox injected at each site:  -Procerus muscle, midline injection  -Corrugator muscle, bilateral injection  -Frontalis muscle, bilateral injection, with 2 sites each side, medial injection was  performed in the upper one third of the frontalis muscle, in the region vertical from the medial inferior edge of the superior orbital rim. The lateral injection was again in the upper one third of the forehead vertically above the lateral limbus of the cornea, 1.5 cm lateral to the medial injection site.  -Temporalis muscle injection, 4 sites, bilaterally. The first injection was 3 cm above the tragus of the ear, second injection site was 1.5 cm to 3 cm up from the first injection site in line with the tragus of the ear. The third injection site was 1.5-3 cm forward between the first 2 injection sites. The fourth injection site was 1.5 cm posterior to the second injection site. 5th site laterally in the temporalis  muscleat the level of the outer canthus.  -Occipitalis muscle injection, 3 sites, bilaterally. The first injection was done one half way between the occipital protuberance and the tip of the mastoid process behind the ear. The second injection site was done lateral and superior to the first, 1 fingerbreadth from the first injection. The third injection site was 1 fingerbreadth superiorly and medially from the first injection site.  -Cervical paraspinal muscle injection, 2 sites, bilaterally. The first injection site was 1 cm from the midline of the cervical spine, 3 cm inferior to the lower border of the occipital protuberance. The second injection site was 1.5 cm superiorly and laterally to the first injection site.  -Trapezius muscle injection was performed at 3 sites, bilaterally. The first injection site was in the upper trapezius muscle halfway between the inflection point of the neck, and the acromion. The second injection site was one half way between the acromion and the first injection site. The third injection was done between the first injection site and the inflection point of the neck.  - masseter injections 5 units bilaterally injected at base of masseter muscle.   -orb oculi  injections 5 units bilaterally at outer edge or orbital rim    Will return for repeat injection in 3 months.   A total of 200 units of Botox was prepared, 175 units of Botox was injected as documented above, any Botox not injected was wasted. The patient tolerated the procedure well, there were no complications of the above procedure.

## 2021-03-05 NOTE — Progress Notes (Signed)
Botox- 200 units x 1 vial Lot: [id number] Expiration: 04/25 NDC: 7622-6333-54  0.9% Sodium Chloride- 45mL total Lot: [id number] Expiration: 12/23 NDC: 93734-287-68  Dx: T15.726 B/B

## 2021-03-06 ENCOUNTER — Other Ambulatory Visit: Payer: Self-pay | Admitting: Primary Care

## 2021-03-06 DIAGNOSIS — M810 Age-related osteoporosis without current pathological fracture: Secondary | ICD-10-CM

## 2021-03-07 ENCOUNTER — Ambulatory Visit (AMBULATORY_SURGERY_CENTER): Payer: PPO | Admitting: Gastroenterology

## 2021-03-07 ENCOUNTER — Encounter: Payer: Self-pay | Admitting: Gastroenterology

## 2021-03-07 ENCOUNTER — Other Ambulatory Visit: Payer: Self-pay

## 2021-03-07 VITALS — BP 123/80 | HR 80 | Temp 96.6°F | Resp 18 | Ht 60.5 in | Wt 122.0 lb

## 2021-03-07 DIAGNOSIS — D123 Benign neoplasm of transverse colon: Secondary | ICD-10-CM | POA: Diagnosis not present

## 2021-03-07 DIAGNOSIS — Z86010 Personal history of colonic polyps: Secondary | ICD-10-CM

## 2021-03-07 DIAGNOSIS — J45909 Unspecified asthma, uncomplicated: Secondary | ICD-10-CM | POA: Diagnosis not present

## 2021-03-07 DIAGNOSIS — D122 Benign neoplasm of ascending colon: Secondary | ICD-10-CM | POA: Diagnosis not present

## 2021-03-07 DIAGNOSIS — I10 Essential (primary) hypertension: Secondary | ICD-10-CM | POA: Diagnosis not present

## 2021-03-07 DIAGNOSIS — F329 Major depressive disorder, single episode, unspecified: Secondary | ICD-10-CM | POA: Diagnosis not present

## 2021-03-07 MED ORDER — SODIUM CHLORIDE 0.9 % IV SOLN: 500.0000 mL | Freq: Once | INTRAVENOUS | Status: DC

## 2021-03-07 NOTE — Op Note (Signed)
[place] Patient Name: [name] Procedure Date: 03/07/2021 9:51 AM MRN: [id number] Endoscopist: Milus Banister , MD Age: 70 Referring MD:  Date of Birth: 14-May-1950 Gender: Female Account #: [id number] Procedure:                Colonoscopy Indications:              High risk colon cancer surveillance: Personal                            history of colonic polyps; Colonoscopy 2018 Dr.                            Marcello Moores found two TAs. Sigmoid resection and                            rectopexy 2018. Colonoscopy Dr. Ardis Hughs 2021                            fourteen subCM TAs removed. Medicines:                Monitored Anesthesia Care Procedure:                Pre-Anesthesia Assessment:                           - Prior to the procedure, a History and Physical                            was performed, and patient medications and                            allergies were reviewed. The patient's tolerance of                            previous anesthesia was also reviewed. The risks                            and benefits of the procedure and the sedation                            options and risks were discussed with the patient.                            All questions were answered, and informed consent                            was obtained. Prior Anticoagulants: The patient has                            taken no previous anticoagulant or antiplatelet                            agents. ASA Grade Assessment: II - A patient with  mild systemic disease. After reviewing the risks                            and benefits, the patient was deemed in                            satisfactory condition to undergo the procedure.                           After obtaining informed consent, the colonoscope                            was passed under direct vision. Throughout the                            procedure, the patient's blood pressure, pulse, and                             oxygen saturations were monitored continuously. The                            CF [id number] #[id number] was introduced through the anus                            and advanced to the the cecum, identified by                            appendiceal orifice and ileocecal valve. The                            colonoscopy was performed without difficulty. The                            patient tolerated the procedure well. The quality                            of the bowel preparation was good. The ileocecal                            valve, appendiceal orifice, and rectum were                            photographed. Scope In: 10:11:47 AM Scope Out: 10:23:16 AM Scope Withdrawal Time: 0 hours 8 minutes 29 seconds  Total Procedure Duration: 0 hours 11 minutes 29 seconds  Findings:                 Two sessile polyps were found in the transverse                            colon and ascending colon. The polyps were 4 to 8                            mm in size. These polyps were removed with a  cold                            snare. Resection and retrieval were complete.                           The exam was otherwise without abnormality on                            direct and retroflexion views. Complications:            No immediate complications. Estimated blood loss:                            None. Estimated Blood Loss:     Estimated blood loss: none. Impression:               - Two 4 to 8 mm polyps in the transverse colon and                            in the ascending colon, removed with a cold snare.                            Resected and retrieved.                           - The examination was otherwise normal on direct                            and retroflexion views. Recommendation:           - Patient has a contact number available for                            emergencies. The signs and symptoms of potential                            delayed complications  were discussed with the                            patient. Return to normal activities tomorrow.                            Written discharge instructions were provided to the                            patient.                           - Resume previous diet.                           - Continue present medications.                           - Await pathology results. Likely referral to  genetic councilor for multiple precancerous polyps                            (will wait until after these results return) Milus Banister, MD 03/07/2021 10:26:31 AM This report has been signed electronically.

## 2021-03-07 NOTE — Progress Notes (Signed)
Report to PACU, RN, vss, BBS= Clear.  

## 2021-03-07 NOTE — Progress Notes (Signed)
HPI: This is a woman with Colonoscopy 2018 Dr. Marcello Moores found two TAs. Sigmoid resection and rectopexy 2018. Colonoscopy Dr. Ardis Hughs 2021 fourteen subCM TAs removed.   ROS: complete GI ROS as described in HPI, all other review negative.  Constitutional:  No unintentional weight loss   Past Medical History:  Diagnosis Date   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   Anxiety    Asthma    CAP (community acquired pneumonia)    Cataract 2018   removed both eyes   COLONIC POLYPS, HX OF 11/15/2009   DEPRESSION 11/14/2009   GASTROJEJ ULCR UNS ACUT/CHRN W/O HEMOR PERF/OBST 11/06/2009   GERD (gastroesophageal reflux disease)    not on meds   Headache(784.0) 11/15/2009   Heart murmur    HEART MURMUR, HX OF 11/15/2009   HIATAL HERNIA 10/08/2009   HIP FRACTURE, LEFT 11/14/2009   HYPERLIPIDEMIA 11/14/2009   HYPERTENSION 11/14/2009   OSTEOPOROSIS 11/14/2009   WEIGHT GAIN 01/10/2010    Past Surgical History:  Procedure Laterality Date   APPENDECTOMY     AUGMENTATION MAMMAPLASTY Bilateral    BACK SURGERY  2003   Bleeding ulcers  10/2009   Broken Hip Left 06/2001   CATARACT EXTRACTION Bilateral 01/2017   COLONOSCOPY N/A 12/30/2016   Procedure: COLONOSCOPY WITH PROPOFOL;  Surgeon: Leighton Ruff, MD;  Location: [facility];  Service: Endoscopy;  Laterality: N/A;   COLONOSCOPY  2011   inadequate prep, polyps   NECK SURGERY  2004 & 2006   x's 2   POLYPECTOMY     RECTAL PROLAPSE REPAIR  01/2017    Current Outpatient Medications  Medication Sig Dispense Refill   albuterol (VENTOLIN HFA) 108 (90 Base) MCG/ACT inhaler Inhale into the lungs as needed for wheezing or shortness of breath.     CALCIUM PO Take 1 tablet by mouth daily.      clonazePAM (KLONOPIN) 0.5 MG tablet Take 0.25 mg by mouth 2 (two) times daily as needed.     ibandronate (BONIVA) 150 MG tablet TAKE 1 TABLET BY MOUTH EVERY 30 DAYS FOR BONE DENSITY. 3 tablet 0   lisinopril (ZESTRIL) 20 MG tablet TAKE 1 TABLET BY MOUTH EVERY DAY FOR BLOOD  PRESSURE 90 tablet 1   Multiple Vitamins-Minerals (MULTIVITAMIN PO) Take 1 tablet by mouth daily.      QUEtiapine (SEROQUEL) 50 MG tablet Take 50 mg by mouth at bedtime.     rizatriptan (MAXALT-MLT) 10 MG disintegrating tablet Take 1 tablet (10 mg total) by mouth as needed for migraine. May repeat in 2 hours if needed. Max 2 tablets in 24 hours. (Patient not taking: Reported on 02/21/2021) 9 tablet 3   rosuvastatin (CRESTOR) 5 MG tablet TAKE 1 TABLET BY MOUTH EVERY DAY FOR CHOLESTEROL 90 tablet 3   traZODone (DESYREL) 50 MG tablet Take 1 tablet (50 mg total) by mouth at bedtime as needed for sleep. 90 tablet 3   VIIBRYD 20 MG TABS Take 40 mg by mouth daily.     Current Facility-Administered Medications  Medication Dose Route Frequency Provider Last Rate Last Admin   0.9 %  sodium chloride infusion  500 mL Intravenous Once Milus Banister, MD        Allergies as of 03/07/2021   (No Known Allergies)    Family History  Problem Relation Age of Onset   Bone cancer Mother    Hypertension Other    Allergic rhinitis Neg Hx    Asthma Neg Hx    Urticaria Neg Hx    Colon cancer Neg  Hx    Colon polyps Neg Hx    Esophageal cancer Neg Hx    Rectal cancer Neg Hx    Stomach cancer Neg Hx     Social History   Socioeconomic History   Marital status: Married    Spouse name: [name]   Number of children: 3   Years of education: BA   Highest education level: Not on file  Occupational History   Occupation: IT consultant and Spa  Tobacco Use   Smoking status: Former    Types: Cigarettes    Quit date: 05/04/1982    Years since quitting: 38.8   Smokeless tobacco: Never   Tobacco comments:    Married, 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[place], 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 (stress)  Vaping Use   Vaping Use: Never used  Substance and Sexual Activity   Alcohol use: Yes    Comment: nightly   Drug use: No   Sexual activity: Not on file  Other Topics  Concern   Not on file  Social History Narrative   Lives with husband   Caffeine use: 1 cup or less    Right handed   Social Determinants of Health   Financial Resource Strain: Low Risk    Difficulty of Paying Living Expenses: Not hard at all  Food Insecurity: No Food Insecurity   Worried About Charity fundraiser in the Last Year: Never true   [place] in the Last Year: Never true  Transportation Needs: No Transportation Needs   Lack of Transportation (Medical): No   Lack of Transportation (Non-Medical): No  Physical Activity: Sufficiently Active   Days of Exercise per Week: 6 days   Minutes of Exercise per Session: 60 min  Stress: No Stress Concern Present   Feeling of Stress : Not at all  Social Connections: Not on file  Intimate Partner Violence: Not At Risk   Fear of Current or Ex-Partner: No   Emotionally Abused: No   Physically Abused: No   Sexually Abused: No     Physical Exam: BP (!) 154/74   Pulse 90   Temp (!) 96.6 F (35.9 C) (Skin)   Ht 5' 0.5" (1.537 m)   Wt 122 lb (55.3 kg)   SpO2 98%   BMI 23.43 kg/m  Constitutional: generally well-appearing Psychiatric: alert and oriented x3 Lungs: CTA bilaterally Heart: no MCR  Assessment and plan: 70 y.o. female with h/o polyps   Colonoscoy today  Care is appropriate for the ambulatory setting.  Owens Loffler, MD [facility] 03/07/2021, 9:56 AM

## 2021-03-07 NOTE — Progress Notes (Signed)
No problems noted in the recovery room. maw 

## 2021-03-07 NOTE — Progress Notes (Signed)
VS by JK  Pt's states no medical or surgical changes since previsit or office visit.  

## 2021-03-07 NOTE — Patient Instructions (Signed)
Handout was given to your care partner on polyps. You may resume your current medications today. Await biopsy results.  May take 1-3 weeks to receive pathology results. Please call if any questions or concerns.    YOU HAD AN ENDOSCOPIC PROCEDURE TODAY AT THE [facility]:   Refer to the procedure report that was given to you for any specific questions about what was found during the examination.  If the procedure report does not answer your questions, please call your gastroenterologist to clarify.  If you requested that your care partner not be given the details of your procedure findings, then the procedure report has been included in a sealed envelope for you to review at your convenience later.  YOU SHOULD EXPECT: Some feelings of bloating in the abdomen. Passage of more gas than usual.  Walking can help get rid of the air that was put into your GI tract during the procedure and reduce the bloating. If you had a lower endoscopy (such as a colonoscopy or flexible sigmoidoscopy) you may notice spotting of blood in your stool or on the toilet paper. If you underwent a bowel prep for your procedure, you may not have a normal bowel movement for a few days.  Please Note:  You might notice some irritation and congestion in your nose or some drainage.  This is from the oxygen used during your procedure.  There is no need for concern and it should clear up in a day or so.  SYMPTOMS TO REPORT IMMEDIATELY:  Following lower endoscopy (colonoscopy or flexible sigmoidoscopy):  Excessive amounts of blood in the stool  Significant tenderness or worsening of abdominal pains  Swelling of the abdomen that is new, acute  Fever of 100F or higher    For urgent or emergent issues, a gastroenterologist can be reached at any hour by calling [phone]. Do not use MyChart messaging for urgent concerns.    DIET:  We do recommend a small meal at first, but then you may proceed to your regular  diet.  Drink plenty of fluids but you should avoid alcoholic beverages for 24 hours.  ACTIVITY:  You should plan to take it easy for the rest of today and you should NOT DRIVE or use heavy machinery until tomorrow (because of the sedation medicines used during the test).    FOLLOW UP: Our staff will call the number listed on your records 48-72 hours following your procedure to check on you and address any questions or concerns that you may have regarding the information given to you following your procedure. If we do not reach you, we will leave a message.  We will attempt to reach you two times.  During this call, we will ask if you have developed any symptoms of COVID 19. If you develop any symptoms (ie: fever, flu-like symptoms, shortness of breath, cough etc.) before then, please call [phone].  If you test positive for Covid 19 in the 2 weeks post procedure, please call and report this information to us.    If any biopsies were taken you will be contacted by phone or by letter within the next 1-3 weeks.  Please call us at [phone] if you have not heard about the biopsies in 3 weeks.    SIGNATURES/CONFIDENTIALITY: You and/or your care partner have signed paperwork which will be entered into your electronic medical record.  These signatures attest to the fact that that the information above on your After Visit Summary has been   reviewed and is understood.  Full responsibility of the confidentiality of this discharge information lies with you and/or your care-partner.    

## 2021-03-07 NOTE — Progress Notes (Signed)
Called to room to assist during endoscopic procedure.  Patient ID and intended procedure confirmed with present staff. Received instructions for my participation in the procedure from the performing physician.  

## 2021-03-11 ENCOUNTER — Telehealth: Payer: Self-pay

## 2021-03-11 NOTE — Telephone Encounter (Signed)
  Follow up Call-  Call back number 03/07/2021 12/29/2019  Post procedure Call Back phone  # [phone]  Permission to leave phone message Yes Yes  Some recent data might be hidden     Patient questions:  Do you have a fever, pain , or abdominal swelling? No. Pain Score  0 *  Have you tolerated food without any problems? Yes.    Have you been able to return to your normal activities? Yes.    Do you have any questions about your discharge instructions: Diet   No. Medications  No. Follow up visit  No.  Do you have questions or concerns about your Care? No.  Actions: * If pain score is 4 or above: No action needed, pain <4.  Have you developed a fever since your procedure? no  2.   Have you had an respiratory symptoms (SOB or cough) since your procedure? no  3.   Have you tested positive for COVID 19 since your procedure no  4.   Have you had any family members/close contacts diagnosed with the COVID 19 since your procedure?  no   If yes to any of these questions please route to Joylene John, RN and Joella Prince, RN

## 2021-03-11 NOTE — Telephone Encounter (Signed)
Called [phone] and left a message we tried to reach pt for a follow up call. maw

## 2021-03-13 ENCOUNTER — Encounter: Payer: Self-pay | Admitting: Gastroenterology

## 2021-03-17 DIAGNOSIS — F419 Anxiety disorder, unspecified: Secondary | ICD-10-CM | POA: Diagnosis not present

## 2021-03-20 ENCOUNTER — Other Ambulatory Visit: Payer: Self-pay | Admitting: Primary Care

## 2021-03-20 DIAGNOSIS — F419 Anxiety disorder, unspecified: Secondary | ICD-10-CM

## 2021-03-28 ENCOUNTER — Other Ambulatory Visit: Payer: Self-pay | Admitting: Primary Care

## 2021-03-28 DIAGNOSIS — E785 Hyperlipidemia, unspecified: Secondary | ICD-10-CM

## 2021-04-02 ENCOUNTER — Other Ambulatory Visit: Payer: Self-pay | Admitting: Primary Care

## 2021-04-02 DIAGNOSIS — F419 Anxiety disorder, unspecified: Secondary | ICD-10-CM | POA: Diagnosis not present

## 2021-04-03 ENCOUNTER — Encounter: Payer: Self-pay | Admitting: Primary Care

## 2021-04-03 ENCOUNTER — Other Ambulatory Visit: Payer: Self-pay

## 2021-04-03 ENCOUNTER — Ambulatory Visit (INDEPENDENT_AMBULATORY_CARE_PROVIDER_SITE_OTHER): Payer: PPO | Admitting: Primary Care

## 2021-04-03 VITALS — BP 118/80 | HR 86 | Temp 98.0°F | Ht 60.5 in | Wt 128.4 lb

## 2021-04-03 DIAGNOSIS — E559 Vitamin D deficiency, unspecified: Secondary | ICD-10-CM

## 2021-04-03 DIAGNOSIS — G8921 Chronic pain due to trauma: Secondary | ICD-10-CM

## 2021-04-03 DIAGNOSIS — F419 Anxiety disorder, unspecified: Secondary | ICD-10-CM

## 2021-04-03 DIAGNOSIS — F32A Depression, unspecified: Secondary | ICD-10-CM | POA: Diagnosis not present

## 2021-04-03 DIAGNOSIS — M159 Polyosteoarthritis, unspecified: Secondary | ICD-10-CM | POA: Diagnosis not present

## 2021-04-03 DIAGNOSIS — G43709 Chronic migraine without aura, not intractable, without status migrainosus: Secondary | ICD-10-CM

## 2021-04-03 DIAGNOSIS — R7303 Prediabetes: Secondary | ICD-10-CM | POA: Diagnosis not present

## 2021-04-03 DIAGNOSIS — Z0000 Encounter for general adult medical examination without abnormal findings: Secondary | ICD-10-CM

## 2021-04-03 DIAGNOSIS — I10 Essential (primary) hypertension: Secondary | ICD-10-CM

## 2021-04-03 DIAGNOSIS — M810 Age-related osteoporosis without current pathological fracture: Secondary | ICD-10-CM | POA: Diagnosis not present

## 2021-04-03 DIAGNOSIS — J4520 Mild intermittent asthma, uncomplicated: Secondary | ICD-10-CM

## 2021-04-03 DIAGNOSIS — E785 Hyperlipidemia, unspecified: Secondary | ICD-10-CM

## 2021-04-03 LAB — COMPREHENSIVE METABOLIC PANEL
ALT: 16 U/L (ref 0–35)
AST: 24 U/L (ref 0–37)
Albumin: 4.5 g/dL (ref 3.5–5.2)
Alkaline Phosphatase: 52 U/L (ref 39–117)
BUN: 13 mg/dL (ref 6–23)
CO2: 30 mEq/L (ref 19–32)
Calcium: 10.4 mg/dL (ref 8.4–10.5)
Chloride: 102 mEq/L (ref 96–112)
Creatinine, Ser: 0.98 mg/dL (ref 0.40–1.20)
GFR: 58.07 mL/min — ABNORMAL LOW (ref >60.00)
Glucose, Bld: 87 mg/dL (ref 70–99)
Potassium: 4.1 mEq/L (ref 3.5–5.1)
Sodium: 140 mEq/L (ref 135–145)
Total Bilirubin: 0.8 mg/dL (ref 0.2–1.2)
Total Protein: 7.2 g/dL (ref 6.0–8.3)

## 2021-04-03 LAB — HEMOGLOBIN A1C: Hgb A1c MFr Bld: 5.7 % (ref 4.6–6.5)

## 2021-04-03 LAB — CBC
HCT: 39.9 % (ref 36.0–46.0)
Hemoglobin: 13.4 g/dL (ref 12.0–15.0)
MCHC: 33.5 g/dL (ref 30.0–36.0)
MCV: 94.6 fl (ref 78.0–100.0)
Platelets: 189 10*3/uL (ref 150.0–400.0)
RBC: 4.22 Mil/uL (ref 3.87–5.11)
RDW: 13.7 % (ref 11.5–15.5)
WBC: 6.6 10*3/uL (ref 4.0–10.5)

## 2021-04-03 LAB — LIPID PANEL
Cholesterol: 183 mg/dL (ref 0–200)
HDL: 84.6 mg/dL (ref >39.00)
LDL Cholesterol: 85 mg/dL (ref 0–99)
NonHDL: 98.47
Total CHOL/HDL Ratio: 2
Triglycerides: 69 mg/dL (ref 0.0–149.0)
VLDL: 13.8 mg/dL (ref 0.0–40.0)

## 2021-04-03 LAB — VITAMIN D 25 HYDROXY (VIT D DEFICIENCY, FRACTURES): VITD: 50.97 ng/mL (ref 30.00–100.00)

## 2021-04-03 MED ORDER — ALBUTEROL SULFATE HFA 108 (90 BASE) MCG/ACT IN AERS: 1.0000 | INHALATION_SPRAY | Freq: Four times a day (QID) | RESPIRATORY_TRACT | 0 refills | Status: DC | PRN

## 2021-04-03 NOTE — Assessment & Plan Note (Signed)
Bone density scan from 2021 reviewed with patient.  She is compliant to calcium and vitamin D, weightbearing exercise, Boniva 150 mg monthly.  Continue same.  Repeat bone density scan in early 2023.

## 2021-04-03 NOTE — Assessment & Plan Note (Signed)
Well controlled in the office today, continue lisinopril 20 mg. CMP pending.

## 2021-04-03 NOTE — Assessment & Plan Note (Signed)
Overall stable over the years. Repeat A1c pending.

## 2021-04-03 NOTE — Patient Instructions (Addendum)
Stop by the lab prior to leaving today. I will notify you of your results once received.   You are due for a bone density and mammogram in 2023.  It was a pleasure to see you today!  Preventive Care 22 Years and Older, Female Preventive care refers to lifestyle choices and visits with your health care provider that can promote health and wellness. Preventive care visits are also called wellness exams. What can I expect for my preventive care visit? Counseling Your health care provider may ask you questions about your: Medical history, including: Past medical problems. Family medical history. Pregnancy and menstrual history. History of falls. Current health, including: Memory and ability to understand (cognition). Emotional well-being. Home life and relationship well-being. Sexual activity and sexual health. Lifestyle, including: Alcohol, nicotine or tobacco, and drug use. Access to firearms. Diet, exercise, and sleep habits. Work and work Statistician. Sunscreen use. Safety issues such as seatbelt and bike helmet use. Physical exam Your health care provider will check your: Height and weight. These may be used to calculate your BMI (body mass index). BMI is a measurement that tells if you are at a healthy weight. Waist circumference. This measures the distance around your waistline. This measurement also tells if you are at a healthy weight and may help predict your risk of certain diseases, such as type 2 diabetes and high blood pressure. Heart rate and blood pressure. Body temperature. Skin for abnormal spots. What immunizations do I need? Vaccines are usually given at various ages, according to a schedule. Your health care provider will recommend vaccines for you based on your age, medical history, and lifestyle or other factors, such as travel or where you work. What tests do I need? Screening Your health care provider may recommend screening tests for certain conditions.  This may include: Lipid and cholesterol levels. Hepatitis C test. Hepatitis B test. HIV (human immunodeficiency virus) test. STI (sexually transmitted infection) testing, if you are at risk. Lung cancer screening. Colorectal cancer screening. Diabetes screening. This is done by checking your blood sugar (glucose) after you have not eaten for a while (fasting). Mammogram. Talk with your health care provider about how often you should have regular mammograms. BRCA-related cancer screening. This may be done if you have a family history of breast, ovarian, tubal, or peritoneal cancers. Bone density scan. This is done to screen for osteoporosis. Talk with your health care provider about your test results, treatment options, and if necessary, the need for more tests. Follow these instructions at home: Eating and drinking  Eat a diet that includes fresh fruits and vegetables, whole grains, lean protein, and low-fat dairy products. Limit your intake of foods with high amounts of sugar, saturated fats, and salt. Take vitamin and mineral supplements as recommended by your health care provider. Do not drink alcohol if your health care provider tells you not to drink. If you drink alcohol: Limit how much you have to 0-1 drink a day. Know how much alcohol is in your drink. In the [place] one drink equals one 12 oz bottle of beer (355 mL), one 5 oz glass of wine (148 mL), or one 1 oz glass of hard liquor (44 mL). Lifestyle Brush your teeth every morning and night with fluoride toothpaste. Floss one time each day. Exercise for at least 30 minutes 5 or more days each week. Do not use any products that contain nicotine or tobacco. These products include cigarettes, chewing tobacco, and vaping devices, such as e-cigarettes. If you  need help quitting, ask your health care provider. Do not use drugs. If you are sexually active, practice safe sex. Use a condom or other form of protection in order to prevent  STIs. Take aspirin only as told by your health care provider. Make sure that you understand how much to take and what form to take. Work with your health care provider to find out whether it is safe and beneficial for you to take aspirin daily. Ask your health care provider if you need to take a cholesterol-lowering medicine (statin). Find healthy ways to manage stress, such as: Meditation, yoga, or listening to music. Journaling. Talking to a trusted person. Spending time with friends and family. Minimize exposure to UV radiation to reduce your risk of skin cancer. Safety Always wear your seat belt while driving or riding in a vehicle. Do not drive: If you have been drinking alcohol. Do not ride with someone who has been drinking. When you are tired or distracted. While texting. If you have been using any mind-altering substances or drugs. Wear a helmet and other protective equipment during sports activities. If you have firearms in your house, make sure you follow all gun safety procedures. What's next? Visit your health care provider once a year for an annual wellness visit. Ask your health care provider how often you should have your eyes and teeth checked. Stay up to date on all vaccines. This information is not intended to replace advice given to you by your health care provider. Make sure you discuss any questions you have with your health care provider. Document Revised: 10/16/2020 Document Reviewed: 10/16/2020 Elsevier Patient Education  [place].

## 2021-04-03 NOTE — Progress Notes (Signed)
Subjective:    Patient ID: [name], female    DOB: 1951-03-22, 70 y.o.   MRN: [id number]  HPI  [name] is a very pleasant 69 y.o. female who presents today for complete physical and follow up of chronic conditions.  Immunizations: -Tetanus: 2014 -Influenza: Completed this season  -Covid-19: Completed 4 vaccines -Shingles: Completed Shingrix -Pneumonia: Prevnar 13 in 2017, Pneumovax in 2018  Diet: [place].  Exercise: 6 days weekly for one hour  Eye exam: Completes annually  Dental exam: Completes semi-annually   Mammogram: Completed in May 2022 Dexa: Completed in 2021, osteopenia Colonoscopy: Completed in 2022, due 2025  BP Readings from Last 3 Encounters:  04/03/21 118/80  03/07/21 123/80  11/27/20 121/85       Review of Systems  Constitutional:  Negative for unexpected weight change.  HENT:  Negative for rhinorrhea.   Eyes:  Negative for visual disturbance.  Respiratory:  Negative for cough and shortness of breath.   Cardiovascular:  Negative for chest pain.  Gastrointestinal:  Negative for constipation and diarrhea.  Genitourinary:  Negative for difficulty urinating.  Musculoskeletal:  Positive for arthralgias, back pain and neck pain.  Skin:  Negative for rash.  Allergic/Immunologic: Negative for environmental allergies.  Neurological:  Negative for dizziness and headaches.  Psychiatric/Behavioral:  The patient is nervous/anxious.         Past Medical History:  Diagnosis Date   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   Anxiety    Asthma    CAP (community acquired pneumonia)    Cataract 2018   removed both eyes   COLONIC POLYPS, HX OF 11/15/2009   DEPRESSION 11/14/2009   GASTROJEJ ULCR UNS ACUT/CHRN W/O HEMOR PERF/OBST 11/06/2009   GERD (gastroesophageal reflux disease)    not on meds   Headache(784.0) 11/15/2009   Heart murmur    HEART MURMUR, HX OF 11/15/2009   HIATAL HERNIA 10/08/2009   HIP FRACTURE, LEFT 11/14/2009    HYPERLIPIDEMIA 11/14/2009   HYPERTENSION 11/14/2009   OSTEOPOROSIS 11/14/2009   WEIGHT GAIN 01/10/2010    Social History   Socioeconomic History   Marital status: Married    Spouse name: [name]   Number of children: 3   Years of education: BA   Highest education level: Not on file  Occupational History   Occupation: IT consultant and Spa  Tobacco Use   Smoking status: Former    Types: Cigarettes    Quit date: 05/04/1982    Years since quitting: 38.9   Smokeless tobacco: Never   Tobacco comments:    Married, 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[place], 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 (stress)  Vaping Use   Vaping Use: Never used  Substance and Sexual Activity   Alcohol use: Yes    Comment: nightly   Drug use: No   Sexual activity: Not on file  Other Topics Concern   Not on file  Social History Narrative   Lives with husband   Caffeine use: 1 cup or less    Right handed   Social Determinants of Health   Financial Resource Strain: Low Risk    Difficulty of Paying Living Expenses: Not hard at all  Food Insecurity: No Food Insecurity   Worried About Charity fundraiser in the Last Year: Never true   Ran Out of Food in the Last Year: Never true  Transportation Needs: No Transportation Needs   Lack of Transportation (Medical): No   Lack of Transportation (  Non-Medical): No  Physical Activity: Sufficiently Active   Days of Exercise per Week: 6 days   Minutes of Exercise per Session: 60 min  Stress: No Stress Concern Present   Feeling of Stress : Not at all  Social Connections: Not on file  Intimate Partner Violence: Not At Risk   Fear of Current or Ex-Partner: No   Emotionally Abused: No   Physically Abused: No   Sexually Abused: No    Past Surgical History:  Procedure Laterality Date   APPENDECTOMY     AUGMENTATION MAMMAPLASTY Bilateral    BACK SURGERY  2003   Bleeding ulcers  10/2009   Broken Hip Left 06/2001   CATARACT  EXTRACTION Bilateral 01/2017   COLONOSCOPY N/A 12/30/2016   Procedure: COLONOSCOPY WITH PROPOFOL;  Surgeon: Leighton Ruff, MD;  Location: [facility];  Service: Endoscopy;  Laterality: N/A;   COLONOSCOPY  2011   inadequate prep, polyps   NECK SURGERY  2004 & 2006   x's 2   POLYPECTOMY     RECTAL PROLAPSE REPAIR  01/2017    Family History  Problem Relation Age of Onset   Bone cancer Mother    Hypertension Other    Allergic rhinitis Neg Hx    Asthma Neg Hx    Urticaria Neg Hx    Colon cancer Neg Hx    Colon polyps Neg Hx    Esophageal cancer Neg Hx    Rectal cancer Neg Hx    Stomach cancer Neg Hx     No Known Allergies  Current Outpatient Medications on File Prior to Visit  Medication Sig Dispense Refill   CALCIUM PO Take 1 tablet by mouth daily.      ibandronate (BONIVA) 150 MG tablet TAKE 1 TABLET BY MOUTH EVERY 30 DAYS FOR BONE DENSITY. 3 tablet 0   lisinopril (ZESTRIL) 20 MG tablet TAKE 1 TABLET BY MOUTH EVERY DAY FOR BLOOD PRESSURE 90 tablet 1   Multiple Vitamins-Minerals (MULTIVITAMIN PO) Take 1 tablet by mouth daily.      rosuvastatin (CRESTOR) 5 MG tablet TAKE 1 TABLET BY MOUTH EVERY DAY FOR CHOLESTEROL. Office visit required for further refills. 90 tablet 0   traZODone (DESYREL) 50 MG tablet Take 1 tablet (50 mg total) by mouth at bedtime as needed for sleep. for sleep. Office visit required for further refills. 90 tablet 0   VIIBRYD 20 MG TABS Take 40 mg by mouth daily. Taking 1/2 tablet + 40 mg tab (50 mg total)     VIIBRYD 40 MG TABS Take 40 mg by mouth daily.     No current facility-administered medications on file prior to visit.    BP 118/80   Pulse 86   Temp 98 F (36.7 C) (Temporal)   Ht 5' 0.5" (1.537 m)   Wt 128 lb 6 oz (58.2 kg)   SpO2 98%   BMI 24.66 kg/m  Objective:   Physical Exam HENT:     Right Ear: Tympanic membrane and ear canal normal.     Left Ear: Tympanic membrane and ear canal normal.     Nose: Nose normal.  Eyes:      Conjunctiva/sclera: Conjunctivae normal.     Pupils: Pupils are equal, round, and reactive to light.  Neck:     Thyroid: No thyromegaly.  Cardiovascular:     Rate and Rhythm: Normal rate and regular rhythm.     Heart sounds: No murmur heard. Pulmonary:     Effort: Pulmonary effort is normal.  Breath sounds: Normal breath sounds. No rales.  Abdominal:     General: Bowel sounds are normal.     Palpations: Abdomen is soft.     Tenderness: There is no abdominal tenderness.  Musculoskeletal:        General: Normal range of motion.     Cervical back: Neck supple.  Lymphadenopathy:     Cervical: No cervical adenopathy.  Skin:    General: Skin is warm and dry.     Findings: No rash.  Neurological:     Mental Status: She is alert and oriented to person, place, and time.     Cranial Nerves: No cranial nerve deficit.     Deep Tendon Reflexes: Reflexes are normal and symmetric.  Psychiatric:        Mood and Affect: Mood normal.          Assessment & Plan:      This visit occurred during the SARS-CoV-2 public health emergency.  Safety protocols were in place, including screening questions prior to the visit, additional usage of staff PPE, and extensive cleaning of exam room while observing appropriate contact time as indicated for disinfecting solutions.

## 2021-04-03 NOTE — Assessment & Plan Note (Signed)
Compliant to rosuvastatin 5 mg, continue same. Repeat lipid panel pending.

## 2021-04-03 NOTE — Assessment & Plan Note (Signed)
Following with Dr. Jaynee Eagles, doing very well on Ajovy monthly and Botox.    Continue same.

## 2021-04-03 NOTE — Assessment & Plan Note (Signed)
Stable. No concerns today.

## 2021-04-03 NOTE — Assessment & Plan Note (Signed)
Well-controlled on as needed albuterol inhaler.  Infrequent use of albuterol.  Refills provided.

## 2021-04-03 NOTE — Assessment & Plan Note (Signed)
Chronic, stable. No concerns today.  

## 2021-04-03 NOTE — Assessment & Plan Note (Signed)
Immunizations up-to-date. Mammogram and bone density scan up-to-date.  Bone density scan due in 2023. Colonoscopy up-to-date, due 2025.  Commended her on regular exercise and a healthy diet.  Exam today stable. Labs pending.

## 2021-04-03 NOTE — Assessment & Plan Note (Signed)
Chronic, uncontrolled today per patient. Following with new psychiatrist who has recently upped her Viibryd dose to 50 mg and discontinued her clonazepam  Continue Viibryd 50 mg, trazodone 50 mg.

## 2021-04-14 DIAGNOSIS — F419 Anxiety disorder, unspecified: Secondary | ICD-10-CM | POA: Diagnosis not present

## 2021-04-23 ENCOUNTER — Other Ambulatory Visit: Payer: Self-pay | Admitting: Primary Care

## 2021-04-23 DIAGNOSIS — I10 Essential (primary) hypertension: Secondary | ICD-10-CM

## 2021-04-30 DIAGNOSIS — F419 Anxiety disorder, unspecified: Secondary | ICD-10-CM | POA: Diagnosis not present

## 2021-05-30 DIAGNOSIS — F419 Anxiety disorder, unspecified: Secondary | ICD-10-CM | POA: Diagnosis not present

## 2021-06-04 ENCOUNTER — Ambulatory Visit: Payer: PPO | Admitting: Family Medicine

## 2021-06-05 ENCOUNTER — Other Ambulatory Visit: Payer: Self-pay | Admitting: Primary Care

## 2021-06-05 DIAGNOSIS — E2839 Other primary ovarian failure: Secondary | ICD-10-CM

## 2021-06-05 DIAGNOSIS — M810 Age-related osteoporosis without current pathological fracture: Secondary | ICD-10-CM

## 2021-06-05 NOTE — Telephone Encounter (Signed)
Received refill request for her bone density medication.  Patient needs updated bone density scan, have her schedule. Orders placed for [facility].

## 2021-06-09 ENCOUNTER — Ambulatory Visit: Payer: PPO | Admitting: Family Medicine

## 2021-06-09 DIAGNOSIS — G43709 Chronic migraine without aura, not intractable, without status migrainosus: Secondary | ICD-10-CM

## 2021-06-09 NOTE — Progress Notes (Signed)
06/09/21 ALL: [name] returns for Botox. She continues to do well. She continues Ajovy samples provided by Dr Jaynee Eagles. She reports that she has not had any migrainous headaches since last being seen. Discussed Botox protocol.   03/05/2021 ALL: She returns for Botox. Rizatriptan helps with abortive needs. She has not had a headache since last procedure. She requests masseter and orb oculi injections as they help with clenching and retro orbital eye pain.   11/27/2020 AA: Stable: botox has "changed my life". +5 each masseter, +5 each orb oculi.  08/27/2020 AA:  +a. Needs dry needling 90% decrease in migraine frequency and severity since starting botox she may get one migraine in the last 1-2 weeks as it is wearinf off. She clenches. She feels the botox wears off near the end..Ask her about Ajovy* in August 2022 if she would like to reconsider but she is doing fantastic on botox.   Consent Form Botulism Toxin Injection For Chronic Migraine   Reviewed orally with patient, additionally signature is on file:  Botulism toxin has been approved by the Federal drug administration for treatment of chronic migraine. Botulism toxin does not cure chronic migraine and it may not be effective in some patients.  The administration of botulism toxin is accomplished by injecting a small amount of toxin into the muscles of the neck and head. Dosage must be titrated for each individual. Any benefits resulting from botulism toxin tend to wear off after 3 months with a repeat injection required if benefit is to be maintained. Injections are usually done every 3-4 months with maximum effect peak achieved by about 2 or 3 weeks. Botulism toxin is expensive and you should be sure of what costs you will incur resulting from the injection.  The side effects of botulism toxin use for chronic migraine may include:   -Transient, and usually mild, facial weakness with facial injections  -Transient, and usually mild, head or neck  weakness with head/neck injections  -Reduction or loss of forehead facial animation due to forehead muscle weakness  -Eyelid drooping  -Dry eye  -Pain at the site of injection or bruising at the site of injection  -Double vision  -Potential unknown long term risks   Contraindications: You should not have Botox if you are pregnant, nursing, allergic to albumin, have an infection, skin condition, or muscle weakness at the site of the injection, or have myasthenia gravis, Lambert-Eaton syndrome, or ALS.  It is also possible that as with any injection, there may be an allergic reaction or no effect from the medication. Reduced effectiveness after repeated injections is sometimes seen and rarely infection at the injection site may occur. All care will be taken to prevent these side effects. If therapy is given over a long time, atrophy and wasting in the muscle injected may occur. Occasionally the patient's become refractory to treatment because they develop antibodies to the toxin. In this event, therapy needs to be modified.  I have read the above information and consent to the administration of botulism toxin.    BOTOX PROCEDURE NOTE FOR MIGRAINE HEADACHE  Contraindications and precautions discussed with patient(above). Aseptic procedure was observed and patient tolerated procedure. Procedure performed by Debbora Presto, FNP-C.   The condition has existed for more than 6 months, and pt does not have a diagnosis of ALS, Myasthenia Gravis or Lambert-Eaton Syndrome.  Risks and benefits of injections discussed and pt agrees to proceed with the procedure.  Written consent obtained  These injections are medically necessary.  Pt  receives good benefits from these injections. These injections do not cause sedations or hallucinations which the oral therapies may cause.   Description of procedure:  The patient was placed in a sitting position. The standard protocol was used for Botox as follows, with 5  units of Botox injected at each site:  -Procerus muscle, midline injection  -Corrugator muscle, bilateral injection  -Frontalis muscle, bilateral injection, with 2 sites each side, medial injection was performed in the upper one third of the frontalis muscle, in the region vertical from the medial inferior edge of the superior orbital rim. The lateral injection was again in the upper one third of the forehead vertically above the lateral limbus of the cornea, 1.5 cm lateral to the medial injection site.  -Temporalis muscle injection, 4 sites, bilaterally. The first injection was 3 cm above the tragus of the ear, second injection site was 1.5 cm to 3 cm up from the first injection site in line with the tragus of the ear. The third injection site was 1.5-3 cm forward between the first 2 injection sites. The fourth injection site was 1.5 cm posterior to the second injection site. 5th site laterally in the temporalis  muscleat the level of the outer canthus.  -Occipitalis muscle injection, 3 sites, bilaterally. The first injection was done one half way between the occipital protuberance and the tip of the mastoid process behind the ear. The second injection site was done lateral and superior to the first, 1 fingerbreadth from the first injection. The third injection site was 1 fingerbreadth superiorly and medially from the first injection site.  -Cervical paraspinal muscle injection, 2 sites, bilaterally. The first injection site was 1 cm from the midline of the cervical spine, 3 cm inferior to the lower border of the occipital protuberance. The second injection site was 1.5 cm superiorly and laterally to the first injection site.  -Trapezius muscle injection was performed at 3 sites, bilaterally. The first injection site was in the upper trapezius muscle halfway between the inflection point of the neck, and the acromion. The second injection site was one half way between the acromion and the first  injection site. The third injection was done between the first injection site and the inflection point of the neck.    Will return for repeat injection in 3 months.   A total of 200 units of Botox was prepared, 155 units of Botox was injected as documented above, any Botox not injected was wasted. The patient tolerated the procedure well, there were no complications of the above procedure.

## 2021-06-09 NOTE — Progress Notes (Signed)
Botox- 200 units x 1 vial Lot: [id number] Expiration: 01/2024 NDC: 3234-6887-37  Bacteriostatic 0.9% Sodium Chloride- 16mL total Lot: [id number] Expiration: 12/03/2022 NDC: 3870-6582-60  Dx: Y88.358 B/B

## 2021-06-13 DIAGNOSIS — F419 Anxiety disorder, unspecified: Secondary | ICD-10-CM | POA: Diagnosis not present

## 2021-06-16 DIAGNOSIS — H43813 Vitreous degeneration, bilateral: Secondary | ICD-10-CM | POA: Diagnosis not present

## 2021-06-23 ENCOUNTER — Other Ambulatory Visit: Payer: Self-pay | Admitting: Primary Care

## 2021-06-23 DIAGNOSIS — E785 Hyperlipidemia, unspecified: Secondary | ICD-10-CM

## 2021-07-11 DIAGNOSIS — F419 Anxiety disorder, unspecified: Secondary | ICD-10-CM | POA: Diagnosis not present

## 2021-07-28 DIAGNOSIS — F419 Anxiety disorder, unspecified: Secondary | ICD-10-CM | POA: Diagnosis not present

## 2021-08-19 DIAGNOSIS — F419 Anxiety disorder, unspecified: Secondary | ICD-10-CM | POA: Diagnosis not present

## 2021-09-01 ENCOUNTER — Emergency Department (HOSPITAL_COMMUNITY)
Admission: EM | Admit: 2021-09-01 | Discharge: 2021-09-01 | Disposition: A | Discharge status: Home / Self Care | Payer: PPO | Attending: Emergency Medicine | Admitting: Emergency Medicine

## 2021-09-01 ENCOUNTER — Telehealth: Payer: Self-pay

## 2021-09-01 ENCOUNTER — Emergency Department (HOSPITAL_COMMUNITY): Payer: PPO

## 2021-09-01 DIAGNOSIS — R630 Anorexia: Secondary | ICD-10-CM | POA: Diagnosis present

## 2021-09-01 DIAGNOSIS — K449 Diaphragmatic hernia without obstruction or gangrene: Secondary | ICD-10-CM | POA: Insufficient documentation

## 2021-09-01 DIAGNOSIS — I700 Atherosclerosis of aorta: Secondary | ICD-10-CM | POA: Diagnosis not present

## 2021-09-01 DIAGNOSIS — I10 Essential (primary) hypertension: Secondary | ICD-10-CM | POA: Diagnosis not present

## 2021-09-01 DIAGNOSIS — F419 Anxiety disorder, unspecified: Secondary | ICD-10-CM | POA: Insufficient documentation

## 2021-09-01 DIAGNOSIS — Z79899 Other long term (current) drug therapy: Secondary | ICD-10-CM | POA: Insufficient documentation

## 2021-09-01 DIAGNOSIS — R0602 Shortness of breath: Secondary | ICD-10-CM | POA: Diagnosis not present

## 2021-09-01 DIAGNOSIS — R112 Nausea with vomiting, unspecified: Secondary | ICD-10-CM | POA: Diagnosis not present

## 2021-09-01 DIAGNOSIS — R5383 Other fatigue: Secondary | ICD-10-CM | POA: Insufficient documentation

## 2021-09-01 LAB — CBC WITH DIFFERENTIAL/PLATELET
Abs Immature Granulocytes: 0.02 10*3/uL (ref 0.00–0.07)
Basophils Absolute: 0 10*3/uL (ref 0.0–0.1)
Basophils Relative: 0 %
Eosinophils Absolute: 0.1 10*3/uL (ref 0.0–0.5)
Eosinophils Relative: 1 %
HCT: 40.4 % (ref 36.0–46.0)
Hemoglobin: 13.5 g/dL (ref 12.0–15.0)
Immature Granulocytes: 0 %
Lymphocytes Relative: 19 %
Lymphs Abs: 1.6 10*3/uL (ref 0.7–4.0)
MCH: 32.3 pg (ref 26.0–34.0)
MCHC: 33.4 g/dL (ref 30.0–36.0)
MCV: 96.7 fL (ref 80.0–100.0)
Monocytes Absolute: 0.6 10*3/uL (ref 0.1–1.0)
Monocytes Relative: 7 %
Neutro Abs: 6.2 10*3/uL (ref 1.7–7.7)
Neutrophils Relative %: 73 %
Platelets: 212 10*3/uL (ref 150–400)
RBC: 4.18 MIL/uL (ref 3.87–5.11)
RDW: 12.2 % (ref 11.5–15.5)
WBC: 8.5 10*3/uL (ref 4.0–10.5)
nRBC: 0 % (ref 0.0–0.2)

## 2021-09-01 LAB — COMPREHENSIVE METABOLIC PANEL WITH GFR
ALT: 24 U/L (ref 0–44)
AST: 25 U/L (ref 15–41)
Albumin: 3.8 g/dL (ref 3.5–5.0)
Alkaline Phosphatase: 63 U/L (ref 38–126)
Anion gap: 8 (ref 5–15)
BUN: 8 mg/dL (ref 8–23)
CO2: 25 mmol/L (ref 22–32)
Calcium: 9.6 mg/dL (ref 8.9–10.3)
Chloride: 101 mmol/L (ref 98–111)
Creatinine, Ser: 0.92 mg/dL (ref 0.44–1.00)
GFR, Estimated: >60 mL/min
Glucose, Bld: 119 mg/dL — ABNORMAL HIGH (ref 70–99)
Potassium: 3.4 mmol/L — ABNORMAL LOW (ref 3.5–5.1)
Sodium: 134 mmol/L — ABNORMAL LOW (ref 135–145)
Total Bilirubin: 0.7 mg/dL (ref 0.3–1.2)
Total Protein: 6.4 g/dL — ABNORMAL LOW (ref 6.5–8.1)

## 2021-09-01 LAB — D-DIMER, QUANTITATIVE: D-Dimer, Quant: 0.3 ug/mL-FEU (ref 0.00–0.50)

## 2021-09-01 LAB — TSH: TSH: 1.838 u[IU]/mL (ref 0.350–4.500)

## 2021-09-01 LAB — BRAIN NATRIURETIC PEPTIDE: B Natriuretic Peptide: 49.7 pg/mL (ref 0.0–100.0)

## 2021-09-01 LAB — VITAMIN B12: Vitamin B-12: 809 pg/mL (ref 180–914)

## 2021-09-01 LAB — TROPONIN I (HIGH SENSITIVITY): Troponin I (High Sensitivity): 7 ng/L (ref <18)

## 2021-09-01 LAB — FOLATE: Folate: >40 ng/mL (ref >5.9)

## 2021-09-01 MED ORDER — ALUM & MAG HYDROXIDE-SIMETH 200-200-20 MG/5ML PO SUSP
30.0000 mL | Freq: Once | ORAL | Status: AC
  Administered 2021-09-01: 30 mL via ORAL
  Filled 2021-09-01: qty 30

## 2021-09-01 MED ORDER — FAMOTIDINE 20 MG PO TABS
20.0000 mg | ORAL_TABLET | Freq: Once | ORAL | Status: AC
  Administered 2021-09-01: 20 mg via ORAL
  Filled 2021-09-01: qty 1

## 2021-09-01 MED ORDER — IOHEXOL 350 MG/ML SOLN
100.0000 mL | Freq: Once | INTRAVENOUS | Status: AC | PRN
  Administered 2021-09-01: 100 mL via INTRAVENOUS

## 2021-09-01 MED ORDER — ACETAMINOPHEN 500 MG PO TABS
1000.0000 mg | ORAL_TABLET | Freq: Once | ORAL | Status: AC
  Administered 2021-09-01: 1000 mg via ORAL
  Filled 2021-09-01: qty 2

## 2021-09-01 MED ORDER — LIDOCAINE VISCOUS HCL 2 % MT SOLN
15.0000 mL | Freq: Once | OROMUCOSAL | Status: AC
  Administered 2021-09-01: 15 mL via ORAL
  Filled 2021-09-01: qty 15

## 2021-09-01 NOTE — ED Provider Triage Note (Signed)
Emergency Medicine Provider Triage Evaluation Note ? ?Ascencion Khloie Hamada , a 71 y.o. female  was evaluated in triage.  Pt complains of SHOB (x 3 weeks, progressively worsening this week), lower lip swelling (onset last night). ?Stopped taking all anxiety meds at the same time 2 weeks ago due to feeling worse. ?Review of Systems  ?Positive: SHOB, lethargic, loss of appetite  ?Negative: fever ? ?Physical Exam  ?BP 106/81 (BP Location: Right Arm)   Pulse 87   Temp 98.7 ?F (37.1 ?C) (Oral)   Resp 17   Ht 5' (1.524 m)   Wt 59.9 kg   SpO2 100%   BMI 25.78 kg/m?  ?Gen:   Awake, no distress   ?Resp:  Normal effort  ?MSK:   Moves extremities without difficulty  ?Other:   ? ?Medical Decision Making  ?Medically screening exam initiated at 11:47 AM.  Appropriate orders placed.  Fontella Adah Stoneberg was informed that the remainder of the evaluation will be completed by another provider, this initial triage assessment does not replace that evaluation, and the importance of remaining in the ED until their evaluation is complete. ? ? ?  ?Tacy Learn, PA-C ?09/01/21 1147 ? ?

## 2021-09-01 NOTE — Telephone Encounter (Signed)
Chart review tab pt is at [facility] ED. Sending note to Romilda Garret NP and Anastasiya CMA. ?

## 2021-09-01 NOTE — ED Provider Notes (Signed)
71 yo F with a cc of shob.  Going on for past three weeks.  Received the patient in signout from Dr. Armandina Gemma.  Plan for CT abd pelvis, ddimer.  Ddimer negative, trop negative no significant anemia no significant electrolyte abnormality.  I discussed the results with the patient.  We will have her follow-up with her family doctor.  Will return for worsening. ?  Deno Etienne, DO ?09/01/21 1630 ? ?

## 2021-09-01 NOTE — Telephone Encounter (Signed)
Noted. I see that patient is still being worked up in the emergency department ?

## 2021-09-01 NOTE — Discharge Instructions (Addendum)
Your symptoms are likely due to an enlargement of your hiatal hernia.  Your CT imaging was generally unremarkable with the exception of this.  Your laboratory work-up was reassuring.  We have provided referral for outpatient follow-up with general surgery.  Usual management of the hiatal hernia is conservative management however in the setting of severe symptoms sometimes surgical management can be indicated. ? ?Try pepcid or tagamet up to twice a day.  Try to avoid things that may make this worse, most commonly these are spicy foods tomato based products fatty foods chocolate and peppermint.  Alcohol and tobacco can also make this worse.  Return to the emergency department for sudden worsening pain fever or inability to eat or drink. ? ?

## 2021-09-01 NOTE — Telephone Encounter (Signed)
[facility] - Client ?TELEPHONE ADVICE RECORD ?[facility]? ?Patient ?Name: ?Jo CHE ?Chang ?Gender: Female ?DOB: 1950/12/07 ?Age: 71 Y 35 M 5 D ?Return ?Phone ?Number: ?[id number] ?(Primary), ?[id number] ?(Secondary) ?Address: ?City/ ?State/ ?Zip: ?[place] ? [address] ?Client [facility] - Client ?Client Site [facility] ?Provider Alma Friendly - NP ?Contact Type Call ?Who Is Calling Patient / Member / Family / Caregiver ?Call Type Triage / Clinical ?Relationship To Patient Self ?Return Phone Number [phone] (Primary) ?Chief Complaint BREATHING - shortness of breath or sounds ?breathless ?Reason for Call Symptomatic / Request for Health Information ?Initial Comment Caller states she is very lethargic, thirsty, loss ?of appetite. She has been on medication by her ?psychiatrist and she states that 3 weeks ago she ?stopped all her medication. Medication was for ?anxiety. Since then her body has been very tense, ?difficulty breathing, heart is racing, and her lower ?lip started swelling. ?Translation No ?Nurse Assessment ?Nurse: D'Heur Lucia Gaskins, RN, Adrienne Date/Time (Eastern Time): 09/01/2021 9:45:17 AM ?Confirm and document reason for call. If ?symptomatic, describe symptoms. ?---Caller states she is very lethargic, thirsty, loss ?of appetite. She has been on medication by her ?psychiatrist and she states that 3 weeks ago she ?stopped all her medication. Medication (prozac and ?hydroxyxine) was for anxiety. Since then her body has ?been very tense, difficulty breathing, heart is racing, ?and her lower lip and tongue started swelling (since ?last night). This has gone down this morning. ?Does the patient have any new or worsening ?symptoms? ---Yes ?Will a triage be completed? ---Yes ?Related visit to physician within the last 2 weeks? ---Yes ?Does the PT have any chronic conditions? (i.e. ?diabetes, asthma, this includes High  risk factors for ?pregnancy, etc.) ?---Yes ?List chronic conditions. ---HTN, anxiety/depression, high cholesterol ?Is this a behavioral health or substance abuse call? ---No ?PLEASE NOTE: All timestamps contained within this report are represented as [place] Standard Time. ?CONFIDENTIALTY NOTICE: This fax transmission is intended only for the addressee. It contains information that is legally privileged, confidential or ?otherwise protected from use or disclosure. If you are not the intended recipient, you are strictly prohibited from reviewing, disclosing, copying using ?or disseminating any of this information or taking any action in reliance on or regarding this information. If you have received this fax in error, please ?notify us immediately by telephone so that we can arrange for its return to [place]. Phone: [phone], Toll-Free: [phone], Fax: [phone] ?Page: 2 of 2 ?Call Id: [id number] ?Guidelines ?Guideline Title Affirmed Question Affirmed Notes Nurse Date/Time (Eastern ?Time) ?Tongue Swelling All other adults ?with swollen tongue ?(Exception: tongue ?swelling is a recurrent ?problem AND NO ?swelling at present) ?D'Heur Lucia Gaskins, ?RN, Vincente Liberty ?09/01/2021 9:52:48 AM ?Disp. Time (Eastern ?Time) Disposition Final User ?09/01/2021 9:41:23 AM Send to Urgent Leilani Merl ?09/01/2021 9:56:05 AM Go to ED Now Yes D'Heur Lucia Gaskins, RN, Adrienne ?Caller Disagree/Comply Comply ?Caller Understands Yes ?PreDisposition Call Doctor ?Care Advice Given Per Guideline ?GO TO ED NOW: * Leave now. Drive carefully. BRING MEDICINES: * Bring a list of your current medicines when you go to the ?Emergency Department (ER). CALL EMS 911 IF: * Difficulty breathing occurs * Can't swallow normal secretions (e.g., drooling or ?spitting) * You become worse CARE ADVICE given per Tongue Swelling (Adult) guideline. ?Comments ?User: Vincente Liberty, D'Heur Lucia Gaskins, RN Date/Time Eilene Ghazi Time): 09/01/2021 9:52:07 AM ?Caller states she started taking  Cymbalta for the past two weeks. ?Referrals ?[place]

## 2021-09-01 NOTE — ED Notes (Signed)
Pt transported to CT ?

## 2021-09-01 NOTE — ED Triage Notes (Signed)
Pt. Stated, [name] been SOB, lethargic, loss of appetite, HTN, anxiety for the last 3 weeks. I called my Dr they said to come to ED. Husband stated, she has done nothing but lay around and usually active. Not eating good.  ?

## 2021-09-01 NOTE — ED Provider Notes (Signed)
?[place] ?Provider Note ? ? ?CSN: [id number] ?Arrival date & time: 09/01/21  1057 ? ?  ? ?History ? ?Chief Complaint  ?Patient presents with  ? Shortness of Breath  ? Fatigue  ? Anxiety  ? Hypertension  ? Anorexia  ? ? ?[name] is a 71 y.o. female. ? ? ?Shortness of Breath ?Associated symptoms: abdominal pain   ?Anxiety ?Associated symptoms include abdominal pain and shortness of breath.  ?Hypertension ?Associated symptoms include abdominal pain and shortness of breath.  ? ?71 year old female with a history of HLD, iron deficiency anemia, anxiety, depression, HTN, gastrojejunal ulcers, hiatal hernia, osteoporosis, chronic migraine headaches, chronic generalized abdominal pain who presents to the emergency department with multiple complaints.  She states that she has been feeling anxious, fatigued, had lip swelling last night, since resolved, decreased oral intake. Chest feels as if she can't take a deep breath.  She has had significantly decreased oral intake and fatigue over the past 3 weeks.  Has had loss of appetite.  She endorses some generalized epigastric discomfort with associated shortness of breath. ? ?Home Medications ?Prior to Admission medications   ?Medication Sig Start Date End Date Taking? Authorizing Provider  ?albuterol (VENTOLIN HFA) 108 (90 Base) MCG/ACT inhaler Inhale 1-2 puffs into the lungs every 6 (six) hours as needed for wheezing or shortness of breath. 04/03/21   Pleas Koch, NP  ?CALCIUM PO Take 1 tablet by mouth daily.     [provider]  ?ibandronate (BONIVA) 150 MG tablet TAKE 1 TABLET BY MOUTH EVERY 30 DAYS FOR BONE DENSITY. 06/05/21   Pleas Koch, NP  ?lisinopril (ZESTRIL) 20 MG tablet TAKE 1 TABLET BY MOUTH EVERY DAY FOR BLOOD PRESSURE 04/23/21   Pleas Koch, NP  ?Multiple Vitamins-Minerals (MULTIVITAMIN PO) Take 1 tablet by mouth daily.     [provider]  ?rosuvastatin (CRESTOR) 5 MG tablet  TAKE 1 TABLET BY MOUTH EVERY DAY FOR CHOLESTEROL. 06/24/21   Pleas Koch, NP  ?traZODone (DESYREL) 50 MG tablet Take 1 tablet (50 mg total) by mouth at bedtime as needed for sleep. for sleep. Office visit required for further refills. 03/20/21   Pleas Koch, NP  ?VIIBRYD 20 MG TABS Take 40 mg by mouth daily. Taking 1/2 tablet + 40 mg tab (50 mg total) 12/22/18   [provider]  ?VIIBRYD 40 MG TABS Take 40 mg by mouth daily. 02/17/21   [provider]  ?   ? ?Allergies    ?Patient has no known allergies.   ? ?Review of Systems   ?Review of Systems  ?Constitutional:  Positive for appetite change and fatigue.  ?Respiratory:  Positive for shortness of breath.   ?Gastrointestinal:  Positive for abdominal pain.  ?All other systems reviewed and are negative. ? ?Physical Exam ?Updated Vital Signs ?BP (!) 143/96   Pulse 81   Temp 98.1 ?F (36.7 ?C) (Oral)   Resp 13   Ht 5' (1.524 m)   Wt 59.9 kg   SpO2 99%   BMI 25.78 kg/m?  ?Physical Exam ?Vitals and nursing note reviewed.  ?Constitutional:   ?   General: She is not in acute distress. ?   Appearance: She is well-developed.  ?HENT:  ?   Head: Normocephalic and atraumatic.  ?Eyes:  ?   Conjunctiva/sclera: Conjunctivae normal.  ?   Pupils: Pupils are equal, round, and reactive to light.  ?Cardiovascular:  ?   Rate and Rhythm: Normal rate  and regular rhythm.  ?   Heart sounds: No murmur heard. ?Pulmonary:  ?   Effort: Pulmonary effort is normal. No respiratory distress.  ?   Breath sounds: Normal breath sounds.  ?Abdominal:  ?   General: There is no distension.  ?   Palpations: Abdomen is soft.  ?   Tenderness: There is abdominal tenderness in the epigastric area. There is no guarding or rebound.  ?Musculoskeletal:     ?   General: No swelling, deformity or signs of injury.  ?   Cervical back: Neck supple.  ?   Right lower leg: No edema.  ?   Left lower leg: No edema.  ?Skin: ?   General: Skin is warm and dry.  ?   Capillary Refill:  Capillary refill takes less than 2 seconds.  ?   Findings: No lesion or rash.  ?Neurological:  ?   General: No focal deficit present.  ?   Mental Status: She is alert and oriented to person, place, and time. Mental status is at baseline.  ?   Cranial Nerves: No cranial nerve deficit.  ?   Motor: No weakness.  ?Psychiatric:     ?   Mood and Affect: Mood normal.  ? ? ?ED Results / Procedures / Treatments   ?Labs ?(all labs ordered are listed, but only abnormal results are displayed) ?Labs Reviewed  ?COMPREHENSIVE METABOLIC PANEL - Abnormal; Notable for the following components:  ?    Result Value  ? Sodium 134 (*)   ? Potassium 3.4 (*)   ? Glucose, Bld 119 (*)   ? Total Protein 6.4 (*)   ? All other components within normal limits  ?CBC WITH DIFFERENTIAL/PLATELET  ?BRAIN NATRIURETIC PEPTIDE  ?D-DIMER, QUANTITATIVE  ?TSH  ?VITAMIN B12  ?FOLATE  ?T4  ?LIPASE, BLOOD  ?TROPONIN I (HIGH SENSITIVITY)  ?TROPONIN I (HIGH SENSITIVITY)  ? ? ?EKG ?EKG Interpretation ? ?Date/Time:  Monday Sep 01 2021 11:35:56 EDT ?Ventricular Rate:  87 ?PR Interval:  144 ?QRS Duration: 82 ?QT Interval:  414 ?QTC Calculation: 498 ?R Axis:   82 ?Text Interpretation: Normal sinus rhythm Right atrial enlargement Prolonged QT Abnormal ECG When compared with ECG of 23-Dec-2016 11:03, PREVIOUS ECG IS PRESENT Confirmed by Regan Lemming ([id number]) on 09/01/2021 1:07:00 PM ? ?Radiology ?DG Chest 2 View ? ?Result Date: 09/01/2021 ?CLINICAL DATA:  Short of breath.  Lethargy. EXAM: CHEST - 2 VIEW COMPARISON:  10/04/2009 FINDINGS: Cervical spine fixation. Patient rotated right on the frontal. Mild cardiomegaly. Significant enlargement of a large hiatal hernia. No pleural effusion or pneumothorax. No congestive failure. EKG lead artifacts over the upper lungs bilaterally. Mild scarring involving the medial left lung base. IMPRESSION: Cardiomegaly without congestive failure. Significant enlargement of a large hiatal hernia. Electronically Signed   By: Abigail Miyamoto M.D.    On: 09/01/2021 12:18  ? ?CT ABDOMEN PELVIS W CONTRAST ? ?Result Date: 09/01/2021 ?CLINICAL DATA:  Nausea and vomiting EXAM: CT ABDOMEN AND PELVIS WITH CONTRAST TECHNIQUE: Multidetector CT imaging of the abdomen and pelvis was performed using the standard protocol following bolus administration of intravenous contrast. RADIATION DOSE REDUCTION: This exam was performed according to the departmental dose-optimization program which includes automated exposure control, adjustment of the mA and/or kV according to patient size and/or use of iterative reconstruction technique. CONTRAST:  153m OMNIPAQUE IOHEXOL 350 MG/ML SOLN COMPARISON:  None. FINDINGS: Lower chest: Large hiatal hernia containing stomach and a portion of the transverse colon. Hepatobiliary: No focal liver abnormality is seen.  No gallstones, gallbladder wall thickening, or biliary dilatation. Pancreas: Unremarkable. No pancreatic ductal dilatation or surrounding inflammatory changes. Spleen: Normal in size without focal abnormality. Adrenals/Urinary Tract: Bilateral adrenal glands are unremarkable. No hydronephrosis or nephrolithiasis. Bladder is unremarkable. Stomach/Bowel: Stomach is within normal limits. Prior partial resection of the sigmoid colon. Appendix is not visualized, although there are no secondary findings of acute appendicitis. No evidence of bowel wall thickening, distention, or inflammatory changes. Vascular/Lymphatic: Aortic atherosclerosis. No enlarged abdominal or pelvic lymph nodes. Reproductive: Uterus and bilateral adnexa are unremarkable. Other: No abdominal wall hernia or abnormality. No abdominopelvic ascites. Musculoskeletal: Intramedullary nail seen in the proximal left femur. Prior posterior fusion of L5-S1. IMPRESSION: 1. No acute findings in the abdomen or pelvis. 2. Large hiatal hernia containing stomach and a portion of the transverse colon. No evidence of obstruction. 3.  Aortic Atherosclerosis (ICD10-I70.0). Electronically  Signed   By: Yetta Glassman M.D.   On: 09/01/2021 15:32   ? ?Procedures ?Procedures  ? ? ?Medications Ordered in ED ?Medications  ?famotidine (PEPCID) tablet 20 mg (20 mg Oral Given 09/01/21 1433)  ?alum & mag hydro

## 2021-09-02 ENCOUNTER — Other Ambulatory Visit: Payer: Self-pay | Admitting: Primary Care

## 2021-09-02 ENCOUNTER — Ambulatory Visit: Payer: PPO | Admitting: Family Medicine

## 2021-09-02 DIAGNOSIS — R9431 Abnormal electrocardiogram [ECG] [EKG]: Secondary | ICD-10-CM

## 2021-09-02 DIAGNOSIS — G43709 Chronic migraine without aura, not intractable, without status migrainosus: Secondary | ICD-10-CM

## 2021-09-02 DIAGNOSIS — M810 Age-related osteoporosis without current pathological fracture: Secondary | ICD-10-CM

## 2021-09-02 HISTORY — DX: Abnormal electrocardiogram (ECG) (EKG): R94.31

## 2021-09-02 LAB — T4: T4, Total: 10.3 ug/dL (ref 4.5–12.0)

## 2021-09-02 NOTE — Progress Notes (Signed)
? ?09/02/21 ALL: [name] returns for Botox. She continues Ajovy every 30 days. She reports headaches have been really bad over the past three weeks. She was seen in the ER yesterday for shob, abdominal pain, and decreased appetitive for the past 3 weeks. CT showed large hiatal hernia. Vitals and EKG normal. She is scheduled to see GI tomorrow. She request to continue with Botox procedure today to help settle down worsening headaches. No acute distress.  ? ?Medications tried and failed: Amovig (ineffective and weight gain) 08/2018 switched to [place] 07/2019 (works well), Cymbalta, Topamax (side effects), Depakote. Abortive: Rizatriptan (works well), Nurtec (works well), Imitrex (ineffective), Ubrelvy (ineffective), Zofran, Zyprexa.  ? ?06/09/2021 ALL: [name] returns for Botox. She continues to do well. She continues Ajovy samples provided by Dr Jaynee Eagles. She reports that she has not had any migrainous headaches since last being seen. Discussed Botox protocol.  ? ?03/05/2021 ALL: She returns for Botox. Rizatriptan helps with abortive needs. She has not had a headache since last procedure. She requests masseter and orb oculi injections as they help with clenching and retro orbital eye pain.  ? ?11/27/2020 AA: Stable: botox has "changed my life". +5 each masseter, +5 each orb oculi. ? ?08/27/2020 AA:  +a. Needs dry needling 90% decrease in migraine frequency and severity since starting botox she may get one migraine in the last 1-2 weeks as it is wearinf off. She clenches. She feels the botox wears off near the end..Ask her about Ajovy* in August 2022 if she would like to reconsider but she is doing fantastic on botox. ? ? ?Consent Form ?Botulism Toxin Injection For Chronic Migraine ? ? ?Reviewed orally with patient, additionally signature is on file: ? ?Botulism toxin has been approved by the Federal drug administration for treatment of chronic migraine. Botulism toxin does not cure chronic migraine and it may not be effective in  some patients. ? ?The administration of botulism toxin is accomplished by injecting a small amount of toxin into the muscles of the neck and head. Dosage must be titrated for each individual. Any benefits resulting from botulism toxin tend to wear off after 3 months with a repeat injection required if benefit is to be maintained. Injections are usually done every 3-4 months with maximum effect peak achieved by about 2 or 3 weeks. Botulism toxin is expensive and you should be sure of what costs you will incur resulting from the injection. ? ?The side effects of botulism toxin use for chronic migraine may include: ? ? -Transient, and usually mild, facial weakness with facial injections ? -Transient, and usually mild, head or neck weakness with head/neck injections ? -Reduction or loss of forehead facial animation due to forehead muscle weakness ? -Eyelid drooping ? -Dry eye ? -Pain at the site of injection or bruising at the site of injection ? -Double vision ? -Potential unknown long term risks ? ? ?Contraindications: You should not have Botox if you are pregnant, nursing, allergic to albumin, have an infection, skin condition, or muscle weakness at the site of the injection, or have myasthenia gravis, Lambert-Eaton syndrome, or ALS. ? ?It is also possible that as with any injection, there may be an allergic reaction or no effect from the medication. Reduced effectiveness after repeated injections is sometimes seen and rarely infection at the injection site may occur. All care will be taken to prevent these side effects. If therapy is given over a long time, atrophy and wasting in the muscle injected may occur. Occasionally the patient's  become refractory to treatment because they develop antibodies to the toxin. In this event, therapy needs to be modified. ? ?I have read the above information and consent to the administration of botulism toxin. ? ? ? ?BOTOX PROCEDURE NOTE FOR MIGRAINE HEADACHE ? ?Contraindications  and precautions discussed with patient(above). Aseptic procedure was observed and patient tolerated procedure. Procedure performed by Debbora Presto, FNP-C.  ? ?The condition has existed for more than 6 months, and pt does not have a diagnosis of ALS, Myasthenia Gravis or Lambert-Eaton Syndrome.  Risks and benefits of injections discussed and pt agrees to proceed with the procedure.  Written consent obtained ? ?These injections are medically necessary. Pt  receives good benefits from these injections. These injections do not cause sedations or hallucinations which the oral therapies may cause. ? ? ?Description of procedure: ? ?The patient was placed in a sitting position. The standard protocol was used for Botox as follows, with 5 units of Botox injected at each site: ? ?-Procerus muscle, midline injection ? ?-Corrugator muscle, bilateral injection ? ?-Frontalis muscle, bilateral injection, with 2 sites each side, medial injection was performed in the upper one third of the frontalis muscle, in the region vertical from the medial inferior edge of the superior orbital rim. The lateral injection was again in the upper one third of the forehead vertically above the lateral limbus of the cornea, 1.5 cm lateral to the medial injection site. ? ?-Temporalis muscle injection, 4 sites, bilaterally. The first injection was 3 cm above the tragus of the ear, second injection site was 1.5 cm to 3 cm up from the first injection site in line with the tragus of the ear. The third injection site was 1.5-3 cm forward between the first 2 injection sites. The fourth injection site was 1.5 cm posterior to the second injection site. 5th site laterally in the temporalis  muscleat the level of the outer canthus. ? ?-Occipitalis muscle injection, 3 sites, bilaterally. The first injection was done one half way between the occipital protuberance and the tip of the mastoid process behind the ear. The second injection site was done lateral and  superior to the first, 1 fingerbreadth from the first injection. The third injection site was 1 fingerbreadth superiorly and medially from the first injection site. ? ?-Cervical paraspinal muscle injection, 2 sites, bilaterally. The first injection site was 1 cm from the midline of the cervical spine, 3 cm inferior to the lower border of the occipital protuberance. The second injection site was 1.5 cm superiorly and laterally to the first injection site. ? ?-Trapezius muscle injection was performed at 3 sites, bilaterally. The first injection site was in the upper trapezius muscle halfway between the inflection point of the neck, and the acromion. The second injection site was one half way between the acromion and the first injection site. The third injection was done between the first injection site and the inflection point of the neck. ? ? ? ?Will return for repeat injection in 3 months. ? ? ?A total of 200 units of Botox was prepared, 155 units of Botox was injected as documented above, any Botox not injected was wasted. The patient tolerated the procedure well, there were no complications of the above procedure. ? ? ? ?

## 2021-09-02 NOTE — Progress Notes (Signed)
Botox- 200 units x 1 vial ?Lot: [id number] ?Expiration: 04/2024 ?[place]: [phone] ? ?Bacteriostatic 0.9% Sodium Chloride- 19m total ?Lot: [id number]?Expiration: 12/03/2022 ?[place] [id number]? ?[place]: GK93.818?B/B ? ?

## 2021-09-03 ENCOUNTER — Ambulatory Visit: Payer: Self-pay | Admitting: Surgery

## 2021-09-03 DIAGNOSIS — K449 Diaphragmatic hernia without obstruction or gangrene: Secondary | ICD-10-CM | POA: Diagnosis not present

## 2021-09-03 NOTE — H&P (View-Only) (Signed)
?[name] ?[id number]  ? ?Referring Provider:  Patria Mane I* ? ? ?Subjective  ? ?Chief Complaint: Hernia ?  ? ? ?History of Present Illness: ?   ?71 year old woman with history of multiple medical problems including osteoporosis, hypertension, hyperlipidemia, hiatal hernia (which per epic has been known since 2011, but the patient did not know about it until she went to the ER on Monday), history of heart murmur, headache, GERD, history of bleeding ulcer, depression, asthma, anxiety, chronic abdominal pain and migraines and rectal prolapse status postrepair by Dr. Marcello Moores robotically.  ?She has been having worsening fatigue and shortness of breath as well as anorexia for the last 3 to 4 weeks associated with anxiety.  She was being treated by another provider who thought that her symptoms were mostly due to anxiety, but she has continued to have them despite medical treatment.  She did stop all of her anxiety medications very recently and noted that she had some issues when she did that.  Reports that she is not sleeping very well, but when she does sleep she is not having any issues breathing while asleep.  Notes difficulty taking a deep breath.  Her husband states that she has not had anything more than soup for the last 3 days.  Prior to a month ago, she denies any heartburn/reflux symptoms or any issues with chest pain/shortness of breath/anorexia. ?He went to the emergency room on May 1 and was worked up with EKG, lab work, chest x-ray, and CT of the chest abdomen pelvis.  All of this was reassuring and the only abnormal finding was a very large hiatal hernia containing stomach and part of her transverse colon. ? ? ?Review of Systems: ?A complete review of systems was obtained from the patient.  I have reviewed this information and discussed as appropriate with the patient.  See HPI as well for other ROS. ? ? ?Medical History: ?Past Medical History:  ?Diagnosis Date  ? Anxiety   ? ? ?There is no  problem list on file for this patient. ? ? ?Past Surgical History:  ?Procedure Laterality Date  ? REPAIR RECTAL PROLAPSE - DELORME  2018  ? APPENDECTOMY    ?  ? ?No Known Allergies ? ?Current Outpatient Medications on File Prior to Visit  ?Medication Sig Dispense Refill  ? ibandronate (BONIVA) 150 mg tablet TAKE 1 TABLET BY MOUTH EVERY 30 DAYS FOR BONE DENSITY.    ? rosuvastatin (CRESTOR) 5 MG tablet Take 1 tablet by mouth once daily    ? lisinopriL (ZESTRIL) 10 MG tablet lisinopril 10 mg tablet    ? ?No current facility-administered medications on file prior to visit.  ? ? ?No family history on file.  ? ?Social History  ? ?Tobacco Use  ?Smoking Status Former  ? Types: Cigarettes  ?Smokeless Tobacco Never  ?  ? ?Social History  ? ?Socioeconomic History  ? Marital status: Married  ?Tobacco Use  ? Smoking status: Former  ?  Types: Cigarettes  ? Smokeless tobacco: Never  ?Vaping Use  ? Vaping Use: Never used  ?Substance and Sexual Activity  ? Alcohol use: Yes  ? Drug use: Never  ? ? ?Objective:  ? ? ?Vitals:  ? 09/03/21 1001  ?BP: 120/82  ?Pulse: 99  ?Temp: 36.4 ?C (97.6 ?F)  ?SpO2: 99%  ?Weight: 59.6 kg (131 lb 8 oz)  ?Height: 152.4 cm (5')  ?  ?Body mass index is 25.68 kg/m?. ? ?Alert, calm and cooperative but appears fatigued and  uncomfortable ?Unlabored respirations with symmetrical air entry ?Diminished soft, nondistended, mildly tender in the upper fields. ? ?Assessment and Plan:  ?Diagnoses and all orders for this visit: ? ?Paraesophageal hernia ? ?I recommend proceeding with robotic paraesophageal hernia repair with fundoplication and possible gastrostomy tube.  I discussed the surgery with her in detail and we went over risks of bleeding, infection, pain, scarring, injury to intra-abdominal or mediastinal structures, dysphagia which may be chronic, poor gastric emptying/gastroparesis symptoms with chronic nausea and ongoing issues with appetite, hernia recurrence, as well as  cardiovascular/pulmonary/thromboembolic risks.  Questions welcomed and answered.  We will get this scheduled as soon as possible as she is quite symptomatic. ? ?Boomer Winders Raquel James, MD  ? ?

## 2021-09-03 NOTE — H&P (Signed)
?Cally Barrientez ?[id number]  ? ?Referring Provider:  Patria Mane I* ? ? ?Subjective  ? ?Chief Complaint: Hernia ?  ? ? ?History of Present Illness: ?   ?71 year old woman with history of multiple medical problems including osteoporosis, hypertension, hyperlipidemia, hiatal hernia (which per epic has been known since 2011, but the patient did not know about it until she went to the ER on Monday), history of heart murmur, headache, GERD, history of bleeding ulcer, depression, asthma, anxiety, chronic abdominal pain and migraines and rectal prolapse status postrepair by Dr. Marcello Moores robotically.  ?She has been having worsening fatigue and shortness of breath as well as anorexia for the last 3 to 4 weeks associated with anxiety.  She was being treated by another provider who thought that her symptoms were mostly due to anxiety, but she has continued to have them despite medical treatment.  She did stop all of her anxiety medications very recently and noted that she had some issues when she did that.  Reports that she is not sleeping very well, but when she does sleep she is not having any issues breathing while asleep.  Notes difficulty taking a deep breath.  Her husband states that she has not had anything more than soup for the last 3 days.  Prior to a month ago, she denies any heartburn/reflux symptoms or any issues with chest pain/shortness of breath/anorexia. ?He went to the emergency room on May 1 and was worked up with EKG, lab work, chest x-ray, and CT of the chest abdomen pelvis.  All of this was reassuring and the only abnormal finding was a very large hiatal hernia containing stomach and part of her transverse colon. ? ? ?Review of Systems: ?A complete review of systems was obtained from the patient.  I have reviewed this information and discussed as appropriate with the patient.  See HPI as well for other ROS. ? ? ?Medical History: ?Past Medical History:  ?Diagnosis Date  ? Anxiety   ? ? ?There is no  problem list on file for this patient. ? ? ?Past Surgical History:  ?Procedure Laterality Date  ? REPAIR RECTAL PROLAPSE - DELORME  2018  ? APPENDECTOMY    ?  ? ?No Known Allergies ? ?Current Outpatient Medications on File Prior to Visit  ?Medication Sig Dispense Refill  ? ibandronate (BONIVA) 150 mg tablet TAKE 1 TABLET BY MOUTH EVERY 30 DAYS FOR BONE DENSITY.    ? rosuvastatin (CRESTOR) 5 MG tablet Take 1 tablet by mouth once daily    ? lisinopriL (ZESTRIL) 10 MG tablet lisinopril 10 mg tablet    ? ?No current facility-administered medications on file prior to visit.  ? ? ?No family history on file.  ? ?Social History  ? ?Tobacco Use  ?Smoking Status Former  ? Types: Cigarettes  ?Smokeless Tobacco Never  ?  ? ?Social History  ? ?Socioeconomic History  ? Marital status: Married  ?Tobacco Use  ? Smoking status: Former  ?  Types: Cigarettes  ? Smokeless tobacco: Never  ?Vaping Use  ? Vaping Use: Never used  ?Substance and Sexual Activity  ? Alcohol use: Yes  ? Drug use: Never  ? ? ?Objective:  ? ? ?Vitals:  ? 09/03/21 1001  ?BP: 120/82  ?Pulse: 99  ?Temp: 36.4 ?C (97.6 ?F)  ?SpO2: 99%  ?Weight: 59.6 kg (131 lb 8 oz)  ?Height: 152.4 cm (5')  ?  ?Body mass index is 25.68 kg/m?. ? ?Alert, calm and cooperative but appears fatigued and  uncomfortable ?Unlabored respirations with symmetrical air entry ?Diminished soft, nondistended, mildly tender in the upper fields. ? ?Assessment and Plan:  ?Diagnoses and all orders for this visit: ? ?Paraesophageal hernia ? ?I recommend proceeding with robotic paraesophageal hernia repair with fundoplication and possible gastrostomy tube.  I discussed the surgery with her in detail and we went over risks of bleeding, infection, pain, scarring, injury to intra-abdominal or mediastinal structures, dysphagia which may be chronic, poor gastric emptying/gastroparesis symptoms with chronic nausea and ongoing issues with appetite, hernia recurrence, as well as  cardiovascular/pulmonary/thromboembolic risks.  Questions welcomed and answered.  We will get this scheduled as soon as possible as she is quite symptomatic. ? ?Nicholai Willette Raquel James, MD  ? ?

## 2021-09-04 ENCOUNTER — Other Ambulatory Visit: Payer: Self-pay | Admitting: Surgery

## 2021-09-04 ENCOUNTER — Encounter (HOSPITAL_COMMUNITY): Payer: Self-pay | Admitting: Surgery

## 2021-09-04 ENCOUNTER — Ambulatory Visit (HOSPITAL_BASED_OUTPATIENT_CLINIC_OR_DEPARTMENT_OTHER)
Admission: RE | Admit: 2021-09-04 | Discharge: 2021-09-04 | Disposition: A | Discharge status: Home / Self Care | Payer: PPO | Source: Ambulatory Visit | Attending: Surgery | Admitting: Surgery

## 2021-09-04 ENCOUNTER — Other Ambulatory Visit (HOSPITAL_COMMUNITY): Payer: Self-pay | Admitting: Surgery

## 2021-09-04 ENCOUNTER — Encounter (HOSPITAL_BASED_OUTPATIENT_CLINIC_OR_DEPARTMENT_OTHER): Payer: Self-pay

## 2021-09-04 DIAGNOSIS — R7989 Other specified abnormal findings of blood chemistry: Secondary | ICD-10-CM | POA: Diagnosis not present

## 2021-09-04 DIAGNOSIS — R0602 Shortness of breath: Secondary | ICD-10-CM

## 2021-09-04 MED ORDER — IOHEXOL 350 MG/ML SOLN
100.0000 mL | Freq: Once | INTRAVENOUS | Status: AC | PRN
Start: 2021-09-04 — End: 2021-09-04
  Administered 2021-09-04: 60 mL via INTRAVENOUS

## 2021-09-04 NOTE — Patient Instructions (Addendum)
DUE TO COVID-19 ONLY TWO VISITORS  (aged 72 and older)  ARE ALLOWED TO COME WITH YOU AND STAY IN THE WAITING ROOM ONLY DURING PRE OP AND PROCEDURE.   ?**NO VISITORS ARE ALLOWED IN THE SHORT STAY AREA OR RECOVERY ROOM!!** ? ?IF YOU WILL BE ADMITTED INTO THE HOSPITAL YOU ARE ALLOWED ONLY FOUR SUPPORT PEOPLE DURING VISITATION HOURS ONLY (7 AM -8PM)   ?The support person(s) must pass our screening, gel in and out, and wear a mask at all times, including in the patient?s room. ?Patients must also wear a mask when staff or their support person are in the room. ?Visitors GUEST BADGE MUST BE WORN VISIBLY  ?One adult visitor may remain with you overnight and MUST be in the room by 8 P.M. ?  ? ? Your procedure is scheduled on: Monday, Sep 15, 2021 ? ? Report to [facility] Main Entrance ? ?  Report to admitting at 5:15 AM ? ? Call this number if you have problems the morning of surgery [phone] ? ? Do not eat food :After Midnight. ? ? After Midnight you may have the following liquids until  4:30 AM DAY OF SURGERY ? ?Water ?Black Coffee (sugar ok, NO MILK/CREAM OR CREAMERS)  ?Tea (sugar ok, NO MILK/CREAM OR CREAMERS) regular and decaf                             ?Plain Jell-O (NO RED)                                           ?Fruit ices (not with fruit pulp, NO RED)                                     ?Popsicles (NO RED)                                                                  ?Juice: apple, WHITE grape, WHITE cranberry ?Sports drinks like Gatorade (NO RED) ?Clear broth(vegetable,chicken,beef) ? ? ?FOLLOW  ANY ADDITIONAL PRE OP INSTRUCTIONS YOU RECEIVED FROM YOUR SURGEON'S OFFICE!!! ?  ?  ?Oral Hygiene is also important to reduce your risk of infection.                                    ?Remember - BRUSH YOUR TEETH THE MORNING OF SURGERY WITH YOUR REGULAR TOOTHPASTE ? ? Do NOT smoke after Midnight ? ? Take these medicines the morning of surgery with A SIP OF WATER:  Rosuvastatin, Viibryd ? ? Bring Asthma  Inhaler Day of surgery ? ?DO NOT TAKE ANY ORAL DIABETIC MEDICATIONS DAY OF YOUR SURGERY ? ?Bring CPAP mask and tubing day of surgery. ?                  ?           You may not have any metal on your body including hair pins, jewelry, and body  piercing ? ?           Do not wear lotions, powders, perfumes/cologne, or deodorant ? ?Do not wear nail polish including gel and S&S, artificial/acrylic nails, or any other type of covering on natural nails including finger and toenails. If you have artificial nails, gel coating, etc. that needs to be removed by a nail salon please have this removed prior to surgery or surgery may need to be canceled/ delayed if the surgeon/ anesthesia feels like they are unable to be safely monitored.  ? ?Do not shave  48 hours prior to surgery.  ? ? Do not bring valuables to the hospital. [place] [facility] NOT ?            RESPONSIBLE   FOR VALUABLES. ? ? Contacts, dentures or bridgework may not be worn into surgery. ? ? Bring small overnight bag day of surgery. ?  ? Patients discharged on the day of surgery will not be allowed to drive home.  Someone NEEDS to stay with you for the first 24 hours after anesthesia. ? ?  ?            Please read over the following fact sheets you were given: IF [place] [phone] ? ?    - Preparing for Surgery ?Before surgery, you can play an important role.  Because skin is not sterile, your skin needs to be as free of germs as possible.  You can reduce the number of germs on your skin by washing with CHG (chlorahexidine gluconate) soap before surgery.  CHG is an antiseptic cleaner which kills germs and bonds with the skin to continue killing germs even after washing. ?Please DO NOT use if you have an allergy to CHG or antibacterial soaps.  If your skin becomes reddened/irritated stop using the CHG and inform your nurse when you arrive at Short Stay. ?Do not shave (including legs and underarms)  for at least 48 hours prior to the first CHG shower.  You may shave your face/neck. ? ?Please follow these instructions carefully: ? 1.  Shower with CHG Soap the night before surgery and the  morning of surgery. ? 2.  If you choose to wash your hair, wash your hair first as usual with your normal  shampoo. ? 3.  After you shampoo, rinse your hair and body thoroughly to remove the shampoo.                            ? 4.  Use CHG as you would any other liquid soap.  You can apply chg directly to the skin and wash.  Gently with a scrungie or clean washcloth. ? 5.  Apply the CHG Soap to your body ONLY FROM THE NECK DOWN.   Do   not use on face/ open      ?                     Wound or open sores. Avoid contact with eyes, ears mouth and   genitals (private parts).  ?                     Production manager,  Genitals (private parts) with your normal soap. ?            6.  Wash thoroughly, paying special attention to the area where your  surgery  will be performed. ? 7.  Thoroughly rinse your body with warm water from the neck down. ? 8.  DO NOT shower/wash with your normal soap after using and rinsing off the CHG Soap. ?               9.  Pat yourself dry with a clean towel. ?           10.  Wear clean pajamas. ?           11.  Place clean sheets on your bed the night of your first shower and do not  sleep with pets. ?Day of Surgery : ?Do not apply any lotions/deodorants the morning of surgery.  Please wear clean clothes to the hospital/surgery center. ? ?FAILURE TO FOLLOW THESE INSTRUCTIONS MAY RESULT IN THE CANCELLATION OF YOUR SURGERY ? ?PATIENT SIGNATURE_________________________________ ? ?NURSE SIGNATURE__________________________________ ? ?________________________________________________________________________  ?

## 2021-09-04 NOTE — Progress Notes (Signed)
For Short Stay: ?[place] appointment date: ?Date of COVID positive in last 90 days: ? ?Bowel Prep reminder: ? ? ?For Anesthesia: ?PCP - Pleas Koch, NP last office visit 04/03/21 in epic ?Cardiologist -  ? ? ?Chest x-ray - 09/01/21 in epic ?EKG - 09/02/21 in epic ?Stress Test -  ?ECHO -  ?Cardiac Cath -  ?Pacemaker/ICD device last checked: ?Pacemaker orders received: ?Device Rep notified: ? ?Spinal Cord Stimulator: ? ?Sleep Study -  ?CPAP -  ? ?Fasting Blood Sugar -  ?Checks Blood Sugar _____ times a day ?Date and result of last Hgb A1c- ? ?Blood Thinner Instructions: ?Aspirin Instructions: ?Last Dose: ? ?Activity level: Can go up a flight of stairs and activities of daily living without stopping and without chest pain and/or shortness of breath ?  Able to exercise without chest pain and/or shortness of breath ?  Unable to go up a flight of stairs without chest pain and/or shortness of breath ?   ? ?Anesthesia review: prolonged QT on EKG 09/02/21 ? ?Patient denies shortness of breath, fever, cough and chest pain at PAT appointment ? ? ?Patient verbalized understanding of instructions that were given to them at the PAT appointment. Patient was also instructed that they will need to review over the PAT instructions again at home before surgery.  ?

## 2021-09-10 ENCOUNTER — Encounter (HOSPITAL_COMMUNITY)
Admission: RE | Admit: 2021-09-10 | Discharge: 2021-09-10 | Disposition: A | Discharge status: Home / Self Care | Payer: PPO | Source: Ambulatory Visit | Attending: Surgery | Admitting: Surgery

## 2021-09-10 ENCOUNTER — Other Ambulatory Visit: Payer: Self-pay

## 2021-09-10 ENCOUNTER — Encounter (HOSPITAL_COMMUNITY): Payer: Self-pay

## 2021-09-10 DIAGNOSIS — K219 Gastro-esophageal reflux disease without esophagitis: Secondary | ICD-10-CM | POA: Diagnosis not present

## 2021-09-10 DIAGNOSIS — K449 Diaphragmatic hernia without obstruction or gangrene: Secondary | ICD-10-CM | POA: Insufficient documentation

## 2021-09-10 DIAGNOSIS — Z01812 Encounter for preprocedural laboratory examination: Secondary | ICD-10-CM | POA: Insufficient documentation

## 2021-09-10 DIAGNOSIS — Z87891 Personal history of nicotine dependence: Secondary | ICD-10-CM | POA: Diagnosis not present

## 2021-09-10 NOTE — Progress Notes (Addendum)
? ?  For Anesthesia: ?PCP - Pleas Koch, NP last office visit 04/03/21 in epic ?Cardiologist - no ? ?CBC/diff, cmp 09-01-21 epic ?Chest x-ray - 09/01/21 in epic ?EKG - 09/02/21 in epic ?Stress Test -  ?ECHO -  ?Cardiac Cath -  ?Pacemaker/ICD device last checked: ?Pacemaker orders received: ?Device Rep notified: ? ?Spinal Cord Stimulator: ? ?Sleep Study -  ?CPAP -  ? ?Fasting Blood Sugar -  ?Checks Blood Sugar _____ times a day ?Date and result of last Hgb A1c- ? ?Blood Thinner Instructions: ?Aspirin Instructions: ?Last Dose: ? ?Activity level: SOB ED admission 09-01-21  for SOB,  No issues with neck mobility  after cervical fusions. Murmur     ? ?Anesthesia review: prolonged QT on EKG 09/02/21 ? ?Patient denies shortness of breath, fever, cough and chest pain at PAT appointment ? ? ?Patient verbalized understanding of instructions that were given to them at the PAT appointment. Patient was also instructed that they will need to review over the PAT instructions again at home before surgery.  ?

## 2021-09-11 NOTE — Anesthesia Preprocedure Evaluation (Addendum)
Anesthesia Evaluation  ?Patient identified by MRN, date of birth, ID band ?Patient awake ? ? ? ?Reviewed: ?Allergy & Precautions, H&P , NPO status , Patient's Chart, lab work & pertinent test results ? ?Airway ?Mallampati: II ? ?TM Distance: >3 FB ?Neck ROM: Full ? ? ? Dental ?no notable dental hx. ?(+) Teeth Intact, Dental Advisory Given, Poor Dentition ?  ?Pulmonary ?neg pulmonary ROS, former smoker,  ?  ?Pulmonary exam normal ?breath sounds clear to auscultation ? ? ? ? ? ? Cardiovascular ?Exercise Tolerance: Good ?hypertension, Pt. on medications ?Normal cardiovascular exam ?Rhythm:Regular Rate:Normal ? ? ?  ?Neuro/Psych ? Headaches, PSYCHIATRIC DISORDERS Anxiety Depression  Neuromuscular disease negative neurological ROS ? negative psych ROS  ? GI/Hepatic ?Neg liver ROS, hiatal hernia, PUD, GERD  Controlled,  ?Endo/Other  ?negative endocrine ROS ? Renal/GU ?negative Renal ROS  ?negative genitourinary ?  ?Musculoskeletal ?negative musculoskeletal ROS ?(+) Arthritis , Osteoarthritis,   ? Abdominal ?  ?Peds ?negative pediatric ROS ?(+)  Hematology ? ?(+) Blood dyscrasia, anemia ,   ?Anesthesia Other Findings ? ? Reproductive/Obstetrics ?negative OB ROS ? ?  ? ? ? ? ? ? ? ? ? ? ? ? ? ?  ?  ? ? ? ? ? ? ?Anesthesia Physical ?Anesthesia Plan ? ?ASA: 3 ? ?Anesthesia Plan: General  ? ?Post-op Pain Management: Minimal or no pain anticipated and Ofirmev IV (intra-op)*  ? ?Induction: Intravenous ? ?PONV Risk Score and Plan: 3 and Dexamethasone and Treatment may vary due to age or medical condition ? ?Airway Management Planned: Oral ETT ? ?Additional Equipment: None ? ?Intra-op Plan:  ? ?Post-operative Plan: Extubation in OR ? ?Informed Consent: I have reviewed the patients History and Physical, chart, labs and discussed the procedure including the risks, benefits and alternatives for the proposed anesthesia with the patient or authorized representative who has indicated his/her  understanding and acceptance.  ? ? ? ? ? ?Plan Discussed with: Anesthesiologist and CRNA ? ?Anesthesia Plan Comments: (See PAT note 09/10/2021 ?DISCUSSION:71 y.o. former smoker with h/o GERD, paraesophageal hernia scheduled for above procedure 09/15/2021 with Dr. Romana Juniper.  ??Pt in ED due to shortness of breath 09/01/21. Per note workup reassuring, lungs clear, chest xray clear.  CT with large hiatal hernia, mild compressive to medial aspect of right LL with some volume loss, no PE.  Sx attributed to large hiatal hernia.   ??History of cervical fusion with hardware in place, C3-C7.  ?CT Angio Chest 09/04/2021 ?IMPRESSION: ?No pulmonary emboli. ?? ?Large hiatal hernia projecting to the right of midline containing ?the majority of the stomach and fat. This exerts mild compressive ?effect upon the medial aspect of the right lower lobe with some ?volume loss in that region. The lungs are otherwise clear and ?normal. ?? ?CT Abdomen 09/01/21 ?IMPRESSION: ?1. No acute findings in the abdomen or pelvis. ?2. Large hiatal hernia containing stomach and a portion of the ?transverse colon. No evidence of obstruction. ?3. ?Aortic Atherosclerosis (ICD10-I70.0). ?? ?EKG: ?09/02/21 ?Rate 87 bpm  ?NSR ?Right atrial enlargement ?Prolonged QT  ? ?2LBIV + art line vs clear sight)  ? ? ? ? ?Anesthesia Quick Evaluation ? ?

## 2021-09-11 NOTE — Progress Notes (Signed)
Anesthesia Chart Review ? ? Case: [id number] Date/Time: 09/15/21 0715  ? Procedures:  ?    XI ROBOTIC ASSISTED PARAESOPHAGEAL HERNIA REPAIR WITH FUNDOPLICATION ?    POSSIBLE INSERTION OF GASTROSTOMY TUBE  ? Anesthesia type: General  ? Pre-op diagnosis: PARAESOPHAGEAL HERNIA  ? Location: [facility] ROOM 05 / [facility]  ? Surgeons: Clovis Riley, MD  ? ?  ? ? ?DISCUSSION:71 y.o. former smoker with h/o GERD, paraesophageal hernia scheduled for above procedure 09/15/2021 with Dr. Romana Juniper.  ? ?Pt in ED due to shortness of breath 09/01/21. Per note workup reassuring, lungs clear, chest xray clear.  CT with large hiatal hernia, mild compressive to medial aspect of right LL with some volume loss, no PE.  Sx attributed to large hiatal hernia.   ? ?History of cervical fusion with hardware in place, C3-C7.  ? ?Anticipate pt can proceed with planned procedure barring acute status change.   ?VS: BP (!) 145/73   Pulse 81   Temp 36.9 ?C (Oral)   Resp 16   Ht 5' (1.524 m)   Wt 58.5 kg   BMI 25.19 kg/m?  ? ?PROVIDERS: ?Pleas Koch, NP is PCP  ? ? ?LABS: Labs reviewed: Acceptable for surgery. ?(all labs ordered are listed, but only abnormal results are displayed) ? ?Labs Reviewed - No data to display ? ? ?IMAGES: ?CT Angio Chest 09/04/2021 ?IMPRESSION: ?No pulmonary emboli. ?  ?Large hiatal hernia projecting to the right of midline containing ?the majority of the stomach and fat. This exerts mild compressive ?effect upon the medial aspect of the right lower lobe with some ?volume loss in that region. The lungs are otherwise clear and ?normal. ? ?CT Abdomen 09/01/21 ?IMPRESSION: ?1. No acute findings in the abdomen or pelvis. ?2. Large hiatal hernia containing stomach and a portion of the ?transverse colon. No evidence of obstruction. ?3.  Aortic Atherosclerosis (ICD10-I70.0). ? ?EKG: ?09/02/21 ?Rate 87 bpm  ?NSR ?Right atrial enlargement ?Prolonged QT  ? ?CV: ? ?Past Medical History:  ?Diagnosis Date  ? ANEMIA-IRON DEFICIENCY  11/14/2009  ? Anxiety   ? Asthma   ? as a child  ? CAP (community acquired pneumonia)   ? Cataract 2018  ? removed both eyes  ? COLONIC POLYPS, HX OF 11/15/2009  ? DEPRESSION 11/14/2009  ? GASTROJEJ ULCR UNS ACUT/CHRN W/O HEMOR PERF/OBST 11/06/2009  ? GERD (gastroesophageal reflux disease)   ? not on meds  ? Headache(784.0) 11/15/2009  ? Heart murmur   ? never bothered pt. always been told this  ? HEART MURMUR, HX OF 11/15/2009  ? HIATAL HERNIA 10/08/2009  ? Large  ? HIP FRACTURE, LEFT 11/14/2009  ? History of hiatal hernia   ? HYPERLIPIDEMIA 11/14/2009  ? HYPERTENSION 11/14/2009  ? OSTEOPOROSIS 11/14/2009  ? Prolonged Q-T interval on ECG 09/02/2021  ? WEIGHT GAIN 01/10/2010  ? ? ?Past Surgical History:  ?Procedure Laterality Date  ? APPENDECTOMY    ? AUGMENTATION MAMMAPLASTY Bilateral   ? BACK SURGERY  2003  ? Bleeding ulcers  10/2009  ? Broken Hip Left 06/2001  ? CATARACT EXTRACTION Bilateral 01/2017  ? COLONOSCOPY N/A 12/30/2016  ? Procedure: COLONOSCOPY WITH PROPOFOL;  Surgeon: Leighton Ruff, MD;  Location: [facility];  Service: Endoscopy;  Laterality: N/A;  ? COLONOSCOPY  2011  ? inadequate prep, polyps  ? NECK SURGERY  2004 & 2006  ? x's 2  ? POLYPECTOMY    ? RECTAL PROLAPSE REPAIR  01/2017  ? ? ?MEDICATIONS: ? albuterol (VENTOLIN HFA)  108 (90 Base) MCG/ACT inhaler  ? CALCIUM PO  ? Fremanezumab-vfrm (AJOVY) 225 MG/1.5ML SOAJ  ? ibandronate (BONIVA) 150 MG tablet  ? lisinopril (ZESTRIL) 20 MG tablet  ? Multiple Vitamins-Minerals (MULTIVITAMIN PO)  ? rosuvastatin (CRESTOR) 5 MG tablet  ? traZODone (DESYREL) 100 MG tablet  ? traZODone (DESYREL) 50 MG tablet  ? ?No current facility-administered medications for this encounter.  ? ? ?Konrad Felix Ward, PA-C ?[facility] ?[phone] ? ? ? ? ? ? ?

## 2021-09-15 ENCOUNTER — Ambulatory Visit (HOSPITAL_BASED_OUTPATIENT_CLINIC_OR_DEPARTMENT_OTHER): Payer: PPO | Admitting: Certified Registered"

## 2021-09-15 ENCOUNTER — Ambulatory Visit (HOSPITAL_COMMUNITY): Payer: PPO | Admitting: Physician Assistant

## 2021-09-15 ENCOUNTER — Other Ambulatory Visit: Payer: Self-pay

## 2021-09-15 ENCOUNTER — Encounter (HOSPITAL_COMMUNITY)
Admission: RE | Disposition: A | Discharge status: Home / Self Care | Payer: Self-pay | Source: Ambulatory Visit | Attending: Surgery

## 2021-09-15 ENCOUNTER — Encounter (HOSPITAL_COMMUNITY): Payer: Self-pay | Admitting: Surgery

## 2021-09-15 ENCOUNTER — Observation Stay (HOSPITAL_COMMUNITY)
Admission: RE | Admit: 2021-09-15 | Discharge: 2021-09-16 | Disposition: A | Discharge status: Home / Self Care | Payer: PPO | Source: Ambulatory Visit | Attending: Surgery | Admitting: Surgery

## 2021-09-15 DIAGNOSIS — F418 Other specified anxiety disorders: Secondary | ICD-10-CM

## 2021-09-15 DIAGNOSIS — M1990 Unspecified osteoarthritis, unspecified site: Secondary | ICD-10-CM | POA: Diagnosis not present

## 2021-09-15 DIAGNOSIS — Z87891 Personal history of nicotine dependence: Secondary | ICD-10-CM | POA: Insufficient documentation

## 2021-09-15 DIAGNOSIS — Z8719 Personal history of other diseases of the digestive system: Secondary | ICD-10-CM

## 2021-09-15 DIAGNOSIS — D649 Anemia, unspecified: Secondary | ICD-10-CM | POA: Diagnosis not present

## 2021-09-15 DIAGNOSIS — Z79899 Other long term (current) drug therapy: Secondary | ICD-10-CM | POA: Insufficient documentation

## 2021-09-15 DIAGNOSIS — K449 Diaphragmatic hernia without obstruction or gangrene: Secondary | ICD-10-CM | POA: Diagnosis not present

## 2021-09-15 DIAGNOSIS — K440 Diaphragmatic hernia with obstruction, without gangrene: Secondary | ICD-10-CM

## 2021-09-15 DIAGNOSIS — I10 Essential (primary) hypertension: Secondary | ICD-10-CM

## 2021-09-15 DIAGNOSIS — J45909 Unspecified asthma, uncomplicated: Secondary | ICD-10-CM | POA: Diagnosis not present

## 2021-09-15 HISTORY — PX: GASTROSTOMY: SHX5249

## 2021-09-15 HISTORY — DX: Personal history of other diseases of the digestive system: Z87.19

## 2021-09-15 HISTORY — PX: XI ROBOTIC ASSISTED PARAESOPHAGEAL HERNIA REPAIR: SHX6871

## 2021-09-15 SURGERY — REPAIR, HERNIA, PARAESOPHAGEAL, ROBOT-ASSISTED
Anesthesia: General | Site: Abdomen

## 2021-09-15 MED ORDER — OXYCODONE HCL 5 MG PO TABS
5.0000 mg | ORAL_TABLET | Freq: Once | ORAL | Status: AC | PRN
  Administered 2021-09-15: 5 mg via ORAL

## 2021-09-15 MED ORDER — METHOCARBAMOL 1000 MG/10ML IJ SOLN: 500.0000 mg | Freq: Four times a day (QID) | INTRAVENOUS | Status: DC | PRN

## 2021-09-15 MED ORDER — OXYCODONE HCL 5 MG PO TABS
ORAL_TABLET | ORAL | Status: AC
  Filled 2021-09-15: qty 1

## 2021-09-15 MED ORDER — ROCURONIUM BROMIDE 10 MG/ML (PF) SYRINGE
PREFILLED_SYRINGE | INTRAVENOUS | Status: AC
  Filled 2021-09-15: qty 10

## 2021-09-15 MED ORDER — ACETAMINOPHEN 500 MG PO TABS
1000.0000 mg | ORAL_TABLET | Freq: Four times a day (QID) | ORAL | Status: DC
  Administered 2021-09-15 – 2021-09-16 (×4): 1000 mg via ORAL
  Filled 2021-09-15 (×4): qty 2

## 2021-09-15 MED ORDER — FENTANYL CITRATE (PF) 100 MCG/2ML IJ SOLN
INTRAMUSCULAR | Status: AC
  Filled 2021-09-15: qty 2

## 2021-09-15 MED ORDER — LACTATED RINGERS IV SOLN: INTRAVENOUS | Status: DC

## 2021-09-15 MED ORDER — CEFAZOLIN SODIUM-DEXTROSE 2-4 GM/100ML-% IV SOLN
2.0000 g | INTRAVENOUS | Status: AC
  Administered 2021-09-15: 2 g via INTRAVENOUS
  Filled 2021-09-15: qty 100

## 2021-09-15 MED ORDER — PROPOFOL 10 MG/ML IV BOLUS
INTRAVENOUS | Status: AC
Start: 2021-09-15 — End: ?
  Filled 2021-09-15: qty 20

## 2021-09-15 MED ORDER — ORAL CARE MOUTH RINSE: 15.0000 mL | Freq: Once | OROMUCOSAL | Status: AC

## 2021-09-15 MED ORDER — TRAMADOL HCL 50 MG PO TABS: 50.0000 mg | ORAL_TABLET | Freq: Four times a day (QID) | ORAL | Status: DC | PRN

## 2021-09-15 MED ORDER — CHLORHEXIDINE GLUCONATE 0.12 % MT SOLN
15.0000 mL | Freq: Once | OROMUCOSAL | Status: AC
  Administered 2021-09-15: 15 mL via OROMUCOSAL

## 2021-09-15 MED ORDER — TRAZODONE HCL 100 MG PO TABS
100.0000 mg | ORAL_TABLET | Freq: Every day | ORAL | Status: DC
  Filled 2021-09-15: qty 1

## 2021-09-15 MED ORDER — OXYCODONE HCL 5 MG/5ML PO SOLN: 5.0000 mg | Freq: Once | ORAL | Status: AC | PRN

## 2021-09-15 MED ORDER — DOCUSATE SODIUM 100 MG PO CAPS
100.0000 mg | ORAL_CAPSULE | Freq: Two times a day (BID) | ORAL | Status: DC
  Administered 2021-09-15 – 2021-09-16 (×2): 100 mg via ORAL
  Filled 2021-09-15 (×2): qty 1

## 2021-09-15 MED ORDER — DEXAMETHASONE SODIUM PHOSPHATE 10 MG/ML IJ SOLN
INTRAMUSCULAR | Status: AC
  Filled 2021-09-15: qty 1

## 2021-09-15 MED ORDER — DEXAMETHASONE SODIUM PHOSPHATE 10 MG/ML IJ SOLN
INTRAMUSCULAR | Status: DC | PRN
  Administered 2021-09-15: 8 mg via INTRAVENOUS

## 2021-09-15 MED ORDER — ONDANSETRON HCL 4 MG/2ML IJ SOLN
INTRAMUSCULAR | Status: AC
  Filled 2021-09-15: qty 2

## 2021-09-15 MED ORDER — GABAPENTIN 300 MG PO CAPS
300.0000 mg | ORAL_CAPSULE | Freq: Two times a day (BID) | ORAL | Status: DC
  Filled 2021-09-15 (×2): qty 1

## 2021-09-15 MED ORDER — PROPOFOL 10 MG/ML IV BOLUS
INTRAVENOUS | Status: DC | PRN
  Administered 2021-09-15: 150 mg via INTRAVENOUS

## 2021-09-15 MED ORDER — ACETAMINOPHEN 160 MG/5ML PO SOLN: 325.0000 mg | ORAL | Status: DC | PRN

## 2021-09-15 MED ORDER — PHENYLEPHRINE HCL-NACL 20-0.9 MG/250ML-% IV SOLN
INTRAVENOUS | Status: DC | PRN
  Administered 2021-09-15: 60 ug/min via INTRAVENOUS
  Administered 2021-09-15: 40 ug/min via INTRAVENOUS

## 2021-09-15 MED ORDER — BUPIVACAINE LIPOSOME 1.3 % IJ SUSP
20.0000 mL | Freq: Once | INTRAMUSCULAR | Status: DC
Start: 2021-09-15 — End: 2021-09-15

## 2021-09-15 MED ORDER — POLYETHYLENE GLYCOL 3350 17 G PO PACK: 17.0000 g | PACK | Freq: Every day | ORAL | Status: DC | PRN

## 2021-09-15 MED ORDER — HYDRALAZINE HCL 20 MG/ML IJ SOLN: 10.0000 mg | INTRAMUSCULAR | Status: DC | PRN

## 2021-09-15 MED ORDER — CHLORHEXIDINE GLUCONATE 4 % EX LIQD: 60.0000 mL | Freq: Once | CUTANEOUS | Status: DC

## 2021-09-15 MED ORDER — BUPIVACAINE LIPOSOME 1.3 % IJ SUSP
INTRAMUSCULAR | Status: AC
  Filled 2021-09-15: qty 20

## 2021-09-15 MED ORDER — FENTANYL CITRATE PF 50 MCG/ML IJ SOSY
25.0000 ug | PREFILLED_SYRINGE | INTRAMUSCULAR | Status: DC | PRN
  Administered 2021-09-15 (×3): 50 ug via INTRAVENOUS

## 2021-09-15 MED ORDER — LACTATED RINGERS IV SOLN
INTRAVENOUS | Status: AC | PRN
  Administered 2021-09-15: 1000 mL

## 2021-09-15 MED ORDER — ROCURONIUM BROMIDE 10 MG/ML (PF) SYRINGE
PREFILLED_SYRINGE | INTRAVENOUS | Status: DC | PRN
  Administered 2021-09-15: 10 mg via INTRAVENOUS
  Administered 2021-09-15 (×2): 20 mg via INTRAVENOUS
  Administered 2021-09-15: 60 mg via INTRAVENOUS

## 2021-09-15 MED ORDER — ENOXAPARIN SODIUM 40 MG/0.4ML IJ SOSY
40.0000 mg | PREFILLED_SYRINGE | INTRAMUSCULAR | Status: DC
  Administered 2021-09-16: 40 mg via SUBCUTANEOUS
  Filled 2021-09-15: qty 0.4

## 2021-09-15 MED ORDER — ALBUTEROL SULFATE (2.5 MG/3ML) 0.083% IN NEBU: 2.5000 mg | INHALATION_SOLUTION | Freq: Four times a day (QID) | RESPIRATORY_TRACT | Status: DC | PRN

## 2021-09-15 MED ORDER — ACETAMINOPHEN 325 MG PO TABS: 325.0000 mg | ORAL_TABLET | ORAL | Status: DC | PRN

## 2021-09-15 MED ORDER — DIPHENHYDRAMINE HCL 50 MG/ML IJ SOLN: 12.5000 mg | Freq: Four times a day (QID) | INTRAMUSCULAR | Status: DC | PRN

## 2021-09-15 MED ORDER — BUPIVACAINE-EPINEPHRINE 0.25% -1:200000 IJ SOLN
INTRAMUSCULAR | Status: DC | PRN
  Administered 2021-09-15: 30 mL

## 2021-09-15 MED ORDER — MEPERIDINE HCL 50 MG/ML IJ SOLN: 6.2500 mg | INTRAMUSCULAR | Status: DC | PRN

## 2021-09-15 MED ORDER — GABAPENTIN 300 MG PO CAPS
300.0000 mg | ORAL_CAPSULE | ORAL | Status: AC
  Administered 2021-09-15: 300 mg via ORAL
  Filled 2021-09-15: qty 1

## 2021-09-15 MED ORDER — ACETAMINOPHEN 500 MG PO TABS
1000.0000 mg | ORAL_TABLET | ORAL | Status: AC
  Administered 2021-09-15: 1000 mg via ORAL
  Filled 2021-09-15: qty 2

## 2021-09-15 MED ORDER — PHENYLEPHRINE 80 MCG/ML (10ML) SYRINGE FOR IV PUSH (FOR BLOOD PRESSURE SUPPORT)
PREFILLED_SYRINGE | INTRAVENOUS | Status: DC | PRN
  Administered 2021-09-15 (×3): 80 ug via INTRAVENOUS

## 2021-09-15 MED ORDER — HYDROMORPHONE HCL 1 MG/ML IJ SOLN: 0.5000 mg | INTRAMUSCULAR | Status: DC | PRN

## 2021-09-15 MED ORDER — LIDOCAINE 2% (20 MG/ML) 5 ML SYRINGE
INTRAMUSCULAR | Status: DC | PRN
  Administered 2021-09-15: 40 mg via INTRAVENOUS

## 2021-09-15 MED ORDER — FENTANYL CITRATE (PF) 250 MCG/5ML IJ SOLN
INTRAMUSCULAR | Status: DC | PRN
  Administered 2021-09-15 (×2): 50 ug via INTRAVENOUS
  Administered 2021-09-15: 100 ug via INTRAVENOUS

## 2021-09-15 MED ORDER — KETOROLAC TROMETHAMINE 15 MG/ML IJ SOLN
15.0000 mg | Freq: Four times a day (QID) | INTRAMUSCULAR | Status: DC | PRN
  Administered 2021-09-16: 15 mg via INTRAVENOUS
  Filled 2021-09-15: qty 1

## 2021-09-15 MED ORDER — METOPROLOL TARTRATE 5 MG/5ML IV SOLN: 5.0000 mg | Freq: Four times a day (QID) | INTRAVENOUS | Status: DC | PRN

## 2021-09-15 MED ORDER — METOCLOPRAMIDE HCL 5 MG/ML IJ SOLN
10.0000 mg | Freq: Four times a day (QID) | INTRAMUSCULAR | Status: DC
  Administered 2021-09-15 – 2021-09-16 (×5): 10 mg via INTRAVENOUS
  Filled 2021-09-15 (×5): qty 2

## 2021-09-15 MED ORDER — 0.9 % SODIUM CHLORIDE (POUR BTL) OPTIME
TOPICAL | Status: DC | PRN
  Administered 2021-09-15: 1000 mL

## 2021-09-15 MED ORDER — BUPIVACAINE-EPINEPHRINE (PF) 0.25% -1:200000 IJ SOLN
INTRAMUSCULAR | Status: AC
  Filled 2021-09-15: qty 30

## 2021-09-15 MED ORDER — ONDANSETRON HCL 4 MG/2ML IJ SOLN: 4.0000 mg | Freq: Four times a day (QID) | INTRAMUSCULAR | Status: DC | PRN

## 2021-09-15 MED ORDER — LACTATED RINGERS IV SOLN: INTRAVENOUS | Status: DC | PRN

## 2021-09-15 MED ORDER — ONDANSETRON HCL 4 MG/2ML IJ SOLN: 4.0000 mg | Freq: Once | INTRAMUSCULAR | Status: DC | PRN

## 2021-09-15 MED ORDER — SUGAMMADEX SODIUM 200 MG/2ML IV SOLN
INTRAVENOUS | Status: DC | PRN
  Administered 2021-09-15: 120 mg via INTRAVENOUS

## 2021-09-15 MED ORDER — DIPHENHYDRAMINE HCL 12.5 MG/5ML PO ELIX: 12.5000 mg | ORAL_SOLUTION | Freq: Four times a day (QID) | ORAL | Status: DC | PRN

## 2021-09-15 MED ORDER — SODIUM CHLORIDE 0.9 % IV SOLN: INTRAVENOUS | Status: DC

## 2021-09-15 MED ORDER — BOOST / RESOURCE BREEZE PO LIQD CUSTOM
1.0000 | Freq: Three times a day (TID) | ORAL | Status: DC
Start: 2021-09-15 — End: 2021-09-16
  Administered 2021-09-15 – 2021-09-16 (×3): 1 via ORAL

## 2021-09-15 MED ORDER — PROPOFOL 10 MG/ML IV BOLUS
INTRAVENOUS | Status: AC
  Filled 2021-09-15: qty 20

## 2021-09-15 MED ORDER — PANTOPRAZOLE SODIUM 40 MG IV SOLR
40.0000 mg | Freq: Every day | INTRAVENOUS | Status: DC
  Administered 2021-09-15: 40 mg via INTRAVENOUS
  Filled 2021-09-15: qty 10

## 2021-09-15 MED ORDER — ONDANSETRON HCL 4 MG/2ML IJ SOLN
INTRAMUSCULAR | Status: DC | PRN
  Administered 2021-09-15: 4 mg via INTRAVENOUS

## 2021-09-15 MED ORDER — ONDANSETRON 4 MG PO TBDP: 4.0000 mg | ORAL_TABLET | Freq: Four times a day (QID) | ORAL | Status: DC | PRN

## 2021-09-15 MED ORDER — FENTANYL CITRATE PF 50 MCG/ML IJ SOSY
PREFILLED_SYRINGE | INTRAMUSCULAR | Status: AC
  Filled 2021-09-15: qty 3

## 2021-09-15 SURGICAL SUPPLY — 82 items
ADH SKN CLS APL DERMABOND .7 (GAUZE/BANDAGES/DRESSINGS) ×1
APL PRP STRL LF DISP 70% ISPRP (MISCELLANEOUS) ×1
APL SKNCLS STERI-STRIP NONHPOA (GAUZE/BANDAGES/DRESSINGS) ×1
APPLIER CLIP 5 13 M/L LIGAMAX5 (MISCELLANEOUS)
APPLIER CLIP ROT 10 11.4 M/L (STAPLE)
APR CLP MED LRG 11.4X10 (STAPLE)
APR CLP MED LRG 5 ANG JAW (MISCELLANEOUS)
BAG COUNTER SPONGE SURGICOUNT (BAG) ×1 IMPLANT
BAG SPEC RTRVL 10 TROC 200 (ENDOMECHANICALS) ×1
BAG SPNG CNTER NS LX DISP (BAG)
BENZOIN TINCTURE PRP APPL 2/3 (GAUZE/BANDAGES/DRESSINGS) ×1 IMPLANT
BLADE SURG SZ11 CARB STEEL (BLADE) ×2 IMPLANT
BNDG ADH 1X3 SHEER STRL LF (GAUZE/BANDAGES/DRESSINGS) ×1 IMPLANT
BNDG ADH THN 3X1 STRL LF (GAUZE/BANDAGES/DRESSINGS) ×1
CHLORAPREP W/TINT 26 (MISCELLANEOUS) ×2 IMPLANT
CLIP APPLIE 5 13 M/L LIGAMAX5 (MISCELLANEOUS) IMPLANT
CLIP APPLIE ROT 10 11.4 M/L (STAPLE) IMPLANT
COVER SURGICAL LIGHT HANDLE (MISCELLANEOUS) ×2 IMPLANT
COVER TIP SHEARS 8 DVNC (MISCELLANEOUS) IMPLANT
COVER TIP SHEARS 8MM DA VINCI (MISCELLANEOUS) ×2
DERMABOND ADVANCED (GAUZE/BANDAGES/DRESSINGS) ×1
DERMABOND ADVANCED .7 DNX12 (GAUZE/BANDAGES/DRESSINGS) ×1 IMPLANT
DRAIN PENROSE 0.5X18 (DRAIN) ×1 IMPLANT
DRAPE ARM DVNC X/XI (DISPOSABLE) ×4 IMPLANT
DRAPE COLUMN DVNC XI (DISPOSABLE) ×1 IMPLANT
DRAPE DA VINCI XI ARM (DISPOSABLE) ×8
DRAPE DA VINCI XI COLUMN (DISPOSABLE) ×2
DRSG TEGADERM 8X12 (GAUZE/BANDAGES/DRESSINGS) ×1 IMPLANT
ELECT REM PT RETURN 15FT ADLT (MISCELLANEOUS) ×2 IMPLANT
ENDOLOOP SUT PDS II  0 18 (SUTURE)
ENDOLOOP SUT PDS II 0 18 (SUTURE) IMPLANT
GAUZE 4X4 16PLY ~~LOC~~+RFID DBL (SPONGE) ×2 IMPLANT
GLOVE BIO SURGEON STRL SZ 6 (GLOVE) ×6 IMPLANT
GLOVE INDICATOR 6.5 STRL GRN (GLOVE) ×6 IMPLANT
GLOVE SS BIOGEL STRL SZ 6 (GLOVE) ×1 IMPLANT
GLOVE SUPERSENSE BIOGEL SZ 6 (GLOVE) ×1
GOWN STRL REUS W/ TWL LRG LVL3 (GOWN DISPOSABLE) ×3 IMPLANT
GOWN STRL REUS W/ TWL XL LVL3 (GOWN DISPOSABLE) IMPLANT
GOWN STRL REUS W/TWL LRG LVL3 (GOWN DISPOSABLE) ×6
GOWN STRL REUS W/TWL XL LVL3 (GOWN DISPOSABLE) ×2
GRASPER SUT TROCAR 14GX15 (MISCELLANEOUS) ×1 IMPLANT
IRRIG SUCT STRYKERFLOW 2 WTIP (MISCELLANEOUS) ×2
IRRIGATION SUCT STRKRFLW 2 WTP (MISCELLANEOUS) ×1 IMPLANT
KIT BASIN OR (CUSTOM PROCEDURE TRAY) ×2 IMPLANT
KIT TURNOVER KIT A (KITS) ×1 IMPLANT
LUBRICANT JELLY K Y 4OZ (MISCELLANEOUS) IMPLANT
MARKER SKIN DUAL TIP RULER LAB (MISCELLANEOUS) ×2 IMPLANT
NDL INSUFFLATION 14GA 120MM (NEEDLE) ×1 IMPLANT
NEEDLE HYPO 22GX1.5 SAFETY (NEEDLE) ×2 IMPLANT
NEEDLE INSUFFLATION 14GA 120MM (NEEDLE) ×2 IMPLANT
PACK CARDIOVASCULAR III (CUSTOM PROCEDURE TRAY) ×2 IMPLANT
PAD POSITIONING PINK XL (MISCELLANEOUS) ×2 IMPLANT
POUCH RETRIEVAL ECOSAC 10 (ENDOMECHANICALS) IMPLANT
POUCH RETRIEVAL ECOSAC 10MM (ENDOMECHANICALS) ×2
SCISSORS LAP 5X35 DISP (ENDOMECHANICALS) IMPLANT
SEAL CANN UNIV 5-8 DVNC XI (MISCELLANEOUS) ×4 IMPLANT
SEAL XI 5MM-8MM UNIVERSAL (MISCELLANEOUS) ×8
SEALER VESSEL DA VINCI XI (MISCELLANEOUS) ×2
SEALER VESSEL EXT DVNC XI (MISCELLANEOUS) ×1 IMPLANT
SOL ANTI FOG 6CC (MISCELLANEOUS) ×1 IMPLANT
SOLUTION ANTI FOG 6CC (MISCELLANEOUS) ×1
SOLUTION ELECTROLUBE (MISCELLANEOUS) ×2 IMPLANT
SPIKE FLUID TRANSFER (MISCELLANEOUS) ×2 IMPLANT
SPONGE DRAIN TRACH 4X4 STRL 2S (GAUZE/BANDAGES/DRESSINGS) ×1 IMPLANT
STRIP CLOSURE SKIN 1/2X4 (GAUZE/BANDAGES/DRESSINGS) ×1 IMPLANT
SUT ETHIBOND 0 36 GRN (SUTURE) ×5 IMPLANT
SUT ETHILON 2 0 PS N (SUTURE) ×2 IMPLANT
SUT MNCRL AB 4-0 PS2 18 (SUTURE) ×2 IMPLANT
SUT PDS AB 2-0 CT2 27 (SUTURE) ×3 IMPLANT
SUT SILK 0 SH 30 (SUTURE) IMPLANT
SUT SILK 2 0 SH (SUTURE) ×1 IMPLANT
SYR 20ML LL LF (SYRINGE) ×2 IMPLANT
TIP INNERVISION DETACH 40FR (MISCELLANEOUS) IMPLANT
TIP INNERVISION DETACH 50FR (MISCELLANEOUS) IMPLANT
TIP INNERVISION DETACH 56FR (MISCELLANEOUS) IMPLANT
TIPS INNERVISION DETACH 40FR (MISCELLANEOUS)
TOWEL OR 17X26 10 PK STRL BLUE (TOWEL DISPOSABLE) ×2 IMPLANT
TOWEL OR NON WOVEN STRL DISP B (DISPOSABLE) ×1 IMPLANT
TRAY FOLEY MTR SLVR 16FR STAT (SET/KITS/TRAYS/PACK) IMPLANT
TROCAR ADV FIXATION 5X100MM (TROCAR) ×2 IMPLANT
TUBE BOLUS G TUBE 26FR (CATHETERS) ×1 IMPLANT
TUBING INSUFFLATION 10FT LAP (TUBING) ×2 IMPLANT

## 2021-09-15 NOTE — Anesthesia Postprocedure Evaluation (Signed)
Anesthesia Post Note ? ?Patient: [name] ? ?Procedure(s) Performed: XI ROBOTIC ASSISTED PARAESOPHAGEAL HERNIA REPAIR WITH GASTROPEXY (Abdomen) ?INSERTION OF GASTROSTOMY TUBE (Abdomen) ? ?  ? ?Patient location during evaluation: PACU ?Anesthesia Type: General ?Level of consciousness: awake and alert ?Pain management: pain level controlled ?Vital Signs Assessment: post-procedure vital signs reviewed and stable ?Respiratory status: spontaneous breathing, nonlabored ventilation, respiratory function stable and patient connected to nasal cannula oxygen ?Cardiovascular status: blood pressure returned to baseline and stable ?Postop Assessment: no apparent nausea or vomiting ?Anesthetic complications: no ? ? ?No notable events documented. ? ?Last Vitals:  ?Vitals:  ? 09/15/21 1130 09/15/21 1145  ?BP: 119/90 132/83  ?Pulse: 97 96  ?Resp: 16 14  ?Temp:  36.8 ?C  ?SpO2: 100% 100%  ?  ?Last Pain:  ?Vitals:  ? 09/15/21 1200  ?TempSrc:   ?PainSc: 5   ? ? ?  ?  ?  ?  ?  ?  ? ?Laurie Lovejoy ? ? ? ? ?

## 2021-09-15 NOTE — Op Note (Signed)
Operative Note ? [name]  ?[id number]  ?[id number]  ?09/15/2021  ? ?Surgeon: Romana Juniper MD FACS ?  ?Assistant: Greer Pickerel MD FACS ?  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  ?Preop diagnosis: paraesophageal hernia ?Post-op diagnosis/intraop findings: same ?  ?Specimens: none ?Retained items: 58f round blake drain in the mediastinum exits the right lateral abdomen; 26 french gastrostomy tube in the left upper quadrant ?EBL: 30cc ?Complications: none ?  ?Description of procedure: After confirming informed consent the patient was taken to the operating room and placed supine on operating room table where general endotracheal anesthesia was initiated, preoperative antibiotics were administered, SCDs applied, and a formal timeout was performed.  A Foley catheter was inserted which is removed at the end of the case.  The abdomen was prepped and draped in the usual sterile fashion. The peritoneal cavity was entered with a left subcostal veress needle insufflated to 177mg. An 86m34mrocar and camera were then placed to the left of the umbilicus.  Gross inspection revealed no evidence of injury from entry. Bilateral TAPS blocks were performed under laparoscopic visualization using quarter percent Marcaine with epinephrine. Under direct visualization, 3 additional 8 mm robotic trocars and a right lateral 5 mm trocar were inserted. A subxiphoid incision was made and the liver retractor introduced for fixed retraction of the left lobe. The patient was then placed in steep reverse Trendelenburg and the robot was docked, all instruments inserted under direct visualization  ?Using the vessel sealer and blunt dissection, the hernia sac was carefully dissected away from the crura circumferentially beginning at the anterior midline.  The sac was chronically thickened and quite adherent  within the mediastinum, extending well into the right chest concordant with findings noted on previous CT scan.  This was a fairly tedious dissection but ultimately we were able to reduce the entirety of the sac into the abdomen and the bowel and omentum was then easily reduced and rested within the abdomen without tension.  There was no apparent violation of the pleura.  The sac was excised.  The esophagus was mobilized dividing flimsy mediastinal attachments with the vessel sealer and blunt dissection.  Despite maximal mobilization, we were only able to achieve enough esophageal length for the gastroesophageal junction to just sit within the abdominal cavity. ?The crura were reapproximated with simple interrupted 0 Ethibonds posteriorly; a total of 5 sutures was used and this narrowed the hiatus nicely to a diameter of about 2.5 cm.  A relaxing incision along the right crus was made to minimize ension on the repair.  ?A gastropexy was then performed with 3 simple interrupted 0 Ethibonds suturing the most proximal fundus to the diaphragm caudally.  A site was chosen for gastrostomy tube along the greater curvature and a pursestring of 2-0 silk was placed following which a gastrotomy was made and a 26 French MIC gastrostomy tube was inserted through the left paramedian trocar site into the stomach, the balloon was inflated and confirmed to be intact.  The pursestring was tied down.  Transfascial stay sutures of 2-0 PDS were then placed cephalad and caudad to the gastrostomy tube and tied down, tacking the stomach to the anterior abdominal wall with minimal tension.  The gastrostomy tube flange was snugged down to the abdominal wall, the tube sits at approximately 3.5 cm at the skin.  The flange is secured to the skin with 3 interrupted 2-0 nylon's. ?  The abdomen was then inspected and hemostasis ensured.  No evidence of any other injury or abnormality.  A 19 French round Blake drain was introduced through the right  lateral trocar site and directed up into the mediastinum towards the patient's right side where the hernia had been compressing the lung.  She was given some Valsalva breaths by the anesthesiologist to hopefully reinflate the compressed lung tissue.  The drain was secured to the skin with a 2-0 nylon.  The liver retractor was removed under direct visualization and then the abdomen was desufflated and all remaining trocars removed.  The skin incisions were closed with subcuticular Monocryl and Dermabond.  Dry dressings were applied to the G-tube and the drain site.  The patient was then awakened, extubated and taken to recovery in stable condition. ?  ?All counts were correct at the completion of the case.  ? ?

## 2021-09-15 NOTE — Transfer of Care (Signed)
Immediate Anesthesia Transfer of Care Note ? ?Patient: [name] ? ?Procedure(s) Performed: XI ROBOTIC ASSISTED PARAESOPHAGEAL HERNIA REPAIR WITH GASTROPEXY (Abdomen) ?INSERTION OF GASTROSTOMY TUBE (Abdomen) ? ?Patient Location: PACU ? ?Anesthesia Type:General ? ?Level of Consciousness: awake, alert  and patient cooperative ? ?Airway & Oxygen Therapy: Patient Spontanous Breathing and Patient connected to face mask oxygen ? ?Post-op Assessment: Report given to RN and Post -op Vital signs reviewed and stable ? ?Post vital signs: Reviewed and stable ? ?Last Vitals:  ?Vitals Value Taken Time  ?BP 133/71 09/15/21 1053  ?Temp    ?Pulse 84 09/15/21 1055  ?Resp 21 09/15/21 1055  ?SpO2 100 % 09/15/21 1055  ?Vitals shown include unvalidated device data. ? ?Last Pain:  ?Vitals:  ? 09/15/21 0556  ?TempSrc:   ?PainSc: 0-No pain  ?   ? ?Patients Stated Pain Goal: 4 (09/15/21 0556) ? ?Complications: No notable events documented. ?

## 2021-09-15 NOTE — Anesthesia Procedure Notes (Signed)
Procedure Name: Intubation ?Date/Time: 09/15/2021 7:34 AM ?Performed by: Eben Burow, CRNA ?Pre-anesthesia Checklist: Patient identified, Emergency Drugs available, Suction available, Patient being monitored and Timeout performed ?Patient Re-evaluated:Patient Re-evaluated prior to induction ?Oxygen Delivery Method: Circle system utilized ?Preoxygenation: Pre-oxygenation with 100% oxygen ?Induction Type: IV induction ?Ventilation: Mask ventilation without difficulty ?Laryngoscope Size: Mac and 4 ?Grade View: Grade I ?Tube type: Oral ?Tube size: 7.0 mm ?Number of attempts: 1 ?Airway Equipment and Method: Stylet ?Placement Confirmation: ETT inserted through vocal cords under direct vision, positive ETCO2 and breath sounds checked- equal and bilateral ?Secured at: 21 cm ?Tube secured with: Tape ?Dental Injury: Teeth and Oropharynx as per pre-operative assessment  ? ? ? ? ?

## 2021-09-15 NOTE — Interval H&P Note (Signed)
History and Physical Interval Note: ? ?09/15/2021 ?7:07 AM ? ?[name]  has presented today for surgery, with the diagnosis of PARAESOPHAGEAL HERNIA.  The various methods of treatment have been discussed with the patient and family. After consideration of risks, benefits and other options for treatment, the patient has consented to  Procedure(s): ?XI ROBOTIC ASSISTED PARAESOPHAGEAL HERNIA REPAIR WITH FUNDOPLICATION (N/A) ?POSSIBLE INSERTION OF GASTROSTOMY TUBE (N/A) as a surgical intervention.  The patient's history has been reviewed, patient examined, no change in status, stable for surgery.  I have reviewed the patient's chart and labs.  Questions were answered to the patient's satisfaction.   ? ? ?[name] ? ? ?

## 2021-09-16 ENCOUNTER — Encounter (HOSPITAL_COMMUNITY): Payer: Self-pay | Admitting: Surgery

## 2021-09-16 ENCOUNTER — Observation Stay (HOSPITAL_COMMUNITY): Payer: PPO

## 2021-09-16 DIAGNOSIS — K449 Diaphragmatic hernia without obstruction or gangrene: Secondary | ICD-10-CM | POA: Diagnosis not present

## 2021-09-16 LAB — CBC
HCT: 34 % — ABNORMAL LOW (ref 36.0–46.0)
Hemoglobin: 11.1 g/dL — ABNORMAL LOW (ref 12.0–15.0)
MCH: 32.3 pg (ref 26.0–34.0)
MCHC: 32.6 g/dL (ref 30.0–36.0)
MCV: 98.8 fL (ref 80.0–100.0)
Platelets: 165 10*3/uL (ref 150–400)
RBC: 3.44 MIL/uL — ABNORMAL LOW (ref 3.87–5.11)
RDW: 12.7 % (ref 11.5–15.5)
WBC: 9.5 10*3/uL (ref 4.0–10.5)
nRBC: 0 % (ref 0.0–0.2)

## 2021-09-16 LAB — BASIC METABOLIC PANEL
Anion gap: 6 (ref 5–15)
BUN: 11 mg/dL (ref 8–23)
CO2: 29 mmol/L (ref 22–32)
Calcium: 8.4 mg/dL — ABNORMAL LOW (ref 8.9–10.3)
Chloride: 104 mmol/L (ref 98–111)
Creatinine, Ser: 0.77 mg/dL (ref 0.44–1.00)
GFR, Estimated: >60 mL/min (ref >60)
Glucose, Bld: 100 mg/dL — ABNORMAL HIGH (ref 70–99)
Potassium: 4 mmol/L (ref 3.5–5.1)
Sodium: 139 mmol/L (ref 135–145)

## 2021-09-16 LAB — MAGNESIUM: Magnesium: 1.8 mg/dL (ref 1.7–2.4)

## 2021-09-16 MED ORDER — TRAMADOL HCL 50 MG PO TABS: 50.0000 mg | ORAL_TABLET | Freq: Four times a day (QID) | ORAL | 0 refills | Status: AC | PRN

## 2021-09-16 MED ORDER — IOHEXOL 300 MG/ML  SOLN
50.0000 mL | Freq: Once | INTRAMUSCULAR | Status: AC | PRN
  Administered 2021-09-16: 60 mL via ORAL

## 2021-09-16 MED ORDER — MAGNESIUM SULFATE 2 GM/50ML IV SOLN
2.0000 g | Freq: Once | INTRAVENOUS | Status: AC
  Administered 2021-09-16: 2 g via INTRAVENOUS
  Filled 2021-09-16: qty 50

## 2021-09-16 MED ORDER — ONDANSETRON 4 MG PO TBDP: 4.0000 mg | ORAL_TABLET | Freq: Three times a day (TID) | ORAL | 0 refills | Status: DC | PRN

## 2021-09-16 NOTE — Progress Notes (Signed)
S: Uneventful night. Pain well controlled. No nausea or dysphagia. Reports breathing is much better.  ? ?O: ?Vitals, labs, intake/output, and orders reviewed at this time. Afebrile. No tachycardia, normotensive, saturating well on room air. PO 1170. UOP 1275 + 2x. Drain output 40; g tube output 120. BM x 1.  ? ? ?Gen: A&Ox3, no distress  ? ?Chest: unlabored respirations, RRR ?Abd: soft, nontender, nondistended, incision(s) c/d/i with dermabond. JP output thin SS; G tube output minimal  ?Ext: warm, no edema ?Neuro: grossly normal ? ?Lines/tubes/drains:  ?-PIV ?-RLQ JP ?-LUQ G tube ? 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  ?-Advance to puree diet ?-Cap g tube ?-upper GI this morning.  ? ?Plan for discharge later today  ? ? ?Romana Juniper, MD FACS ?[facility] ? ?  ?

## 2021-09-16 NOTE — Discharge Instructions (Signed)
LAPAROSCOPIC SURGERY: POST OP INSTRUCTIONS ? ? ?EAT ?Stick with the pur?e/soft diet for the first 2 weeks and then slowly advance-see below ? ?WALK ?Walk an hour a day (cumulative, not all at once).  Control your pain to do that.   ? ?CONTROL PAIN ?Control pain so that you can walk, sleep, tolerate sneezing/coughing, go up/down stairs. ? ?HAVE A BOWEL MOVEMENT DAILY ?Keep your bowels regular to avoid problems.  OK to try a laxative to override constipation.  OK to use an antidairrheal to slow down diarrhea.  Call if not better after 2 tries ? ?CALL IF YOU HAVE PROBLEMS/CONCERNS ?Call if you are still struggling despite following these instructions. ?Call if you have concerns not answered by these instructions ? ? ? ?PAIN CONTROL: ?Pain is best controlled by a usual combination of three different methods TOGETHER: ?Ice/Heat ?Over the counter pain medication ?Prescription pain medication ?Most patients will experience some swelling and bruising around the incisions.  Ice packs or heating pads (30-60 minutes up to 6 times a day) will help. Use ice for the first few days to help decrease swelling and bruising, then switch to heat to help relax tight/sore spots and speed recovery.  Some people prefer to use ice alone, heat alone, alternating between ice & heat.  Experiment to what works for you.  Swelling and bruising can take several weeks to resolve.   ?It is helpful to take an over-the-counter pain medication regularly for the first few days: ?Naproxen (Aleve, etc)  Two '220mg'$  tabs twice a day OR Ibuprofen (Advil, etc) Three '200mg'$  tabs four times a day (every meal & bedtime) AND ?Acetaminophen (Tylenol, etc) 500-'650mg'$  four times a day (every meal & bedtime) ?A  prescription for pain medication (such as oxycodone, hydrocodone, tramadol, gabapentin, methocarbamol, etc) should be given to you upon discharge.  Take your pain medication as prescribed, IF NEEDED.  ?If you are having problems/concerns with the prescription  medicine (does not control pain, nausea, vomiting, rash, itching, etc), please call us [phone] to see if we need to switch you to a different pain medicine that will work better for you and/or control your side effect better. ?If you need a refill on your pain medication, please give [place] 48 hour notice.  contact your pharmacy.  They will contact our office to request authorization. Prescriptions will not be filled after 5 pm or on week-ends ? ?Avoid getting constipated.   ?Between the surgery and the pain medications, it is common to experience some constipation.   ?Increasing fluid intake and taking a fiber supplement (such as Metamucil, Citrucel, FiberCon, MiraLax, etc) 1-2 times a day regularly will usually help prevent this problem from occurring.   ?A mild laxative (prune juice, Milk of Magnesia, MiraLax, etc) should be taken according to package directions if there are no bowel movements after 48 hours.   ?Watch out for diarrhea.   ?If you have many loose bowel movements, simplify your diet to bland foods & liquids for a few days.   ?Stop any stool softeners and decrease your fiber supplement.   ?Switching to mild anti-diarrheal medications (Kayopectate, Pepto Bismol) can help.   ?If this worsens or does not improve, please call us. ? ?Wash / shower every day.  You may shower over the skin glue which is waterproof.  Try to keep the drain and G-tube sites dry.  No rubbing, scrubbing, lotions or ointments to incisions.  Do not soak or submerge until completely healed ? ?Glue will flake  off after about 2 weeks.  You may leave the incision open to air.  You may replace a dressing/Band-Aid to cover the incision for comfort if you wish.  Keep a dry gauze dressing around the G-tube and the drain. ? ?ACTIVITIES as tolerated:   ?You may resume regular (light) daily activities beginning the next day--such as daily self-care, walking, climbing stairs--gradually increasing activities as tolerated.  If you can walk  30 minutes without difficulty, it is safe to try more intense activity such as jogging, treadmill, bicycling, low-impact aerobics, etc. ?Refrain from the most intensive and strenuous activity such as sit-ups, heavy lifting, contact sports, etc  Refrain from any heavy lifting or straining until 2 months after surgery.   ?DO NOT PUSH THROUGH PAIN.  Let pain be your guide: If it hurts to do something, don't do it.  Pain is your body warning you to avoid that activity for another week until the pain goes down. ?You may drive when you are no longer taking prescription pain medication, you can comfortably wear a seatbelt, and you can safely maneuver your car and apply brakes. ?You may have sexual intercourse when it is comfortable. ? ?FOLLOW UP in our office ?Please call [facility] at [phone] to set up an appointment to see your surgeon in the office for a follow-up appointment approximately 2-3 weeks after your surgery. ?Make sure that you call for this appointment the day you arrive home to insure a convenient appointment time. ? ?10. IF YOU HAVE DISABILITY OR FAMILY LEAVE FORMS, BRING THEM TO THE OFFICE FOR PROCESSING.  DO NOT GIVE THEM TO YOUR DOCTOR. ? ? ?WHEN TO CALL us [phone]: ?Poor pain control ?Reactions / problems with new medications (rash/itching, nausea, etc)  ?Fever over 101.5 F (38.5 C) ?Inability to urinate ?Nausea and/or vomiting ?Worsening swelling or bruising ?Continued bleeding from incision. ?Increased pain, redness, or drainage from the incision ? ? The clinic staff is available to answer your questions during regular business hours (8:30am-5pm).  Please don?t hesitate to call and ask to speak to one of our nurses for clinical concerns.  ? If you have a medical emergency, go to the nearest emergency room or call 911. ? A surgeon from [facility] is always on call at the hospitals ? ? ?[facility] [place] ?[address], [place], [place], [place]  [address]  ? ?MAIN: [phone] ? TOLL FREE: [phone] ?  ?FAX ([phone] ?[email or web address] ? ?EATING AFTER YOUR PARAESOPHAGEAL HERNIA REPAIR ? ?After your esophageal surgery, expect some sticking with swallowing over the next 1-2 months.   ? ?If food sticks when you eat, it is called "dysphagia".  This is due to swelling around your esophagus at the wrap & hiatal diaphragm repair.  It will gradually ease off over the next few months.  To help you through this temporary phase, we start you out on a pureed (blenderized) diet. ? ?Your first meal in the hospital was thin liquids.  You should have been given a pureed diet by the time you left the hospital.  We ask patients to stay on a pureed diet for the first 2-3 weeks to avoid anything getting "stuck" near your recent surgery.  Don't be alarmed if your ability to swallow doesn't progress according to this plan.  Everyone is different and some diets can advance more or less quickly.   ? ?It is often helpful to crush your medications or split them as they can sometimes stick,  especially the first week or so. ? ? ?Some BASIC RULES to follow are: ?Maintain an upright position whenever eating or drinking. ?Take small bites - just a teaspoon size bite at a time. ?Eat slowly.  It may also help to eat only one food at a time. ?Consider nibbling through smaller, more frequent meals & avoid the urge to eat BIG meals ?Do not push through feelings of fullness, nausea, or bloatedness ?Do not mix solid foods and liquids in the same mouthful ?Try not to "wash foods down" with large gulps of liquids. ?Avoid carbonated (bubbly/fizzy) drinks.   ?Avoid foods that make you feel gassy or bloated.  Start with bland foods first.  Wait on trying greasy, fried, or spicy meals until you are tolerating more bland solids well. ?Understand that it will be hard to burp and belch at first.  This gradually improves with time.  Expect to be more gassy/flatulent/bloated initially.   Walking will help your body manage it better. ?Consider using medications for bloating that contain simethicone such as  Maalox or Gas-X  ?Consider crushing her medications, especially smaller pills.  The ability

## 2021-09-16 NOTE — Plan of Care (Signed)
?  Problem: Education: Goal: Knowledge of General Education information will improve Description: Including pain rating scale, medication(s)/side effects and non-pharmacologic comfort measures Outcome: Completed/Met   Problem: Health Behavior/Discharge Planning: Goal: Ability to manage health-related needs will improve Outcome: Completed/Met   Problem: Clinical Measurements: Goal: Ability to maintain clinical measurements within normal limits will improve Outcome: Completed/Met Goal: Will remain free from infection Outcome: Completed/Met Goal: Diagnostic test results will improve Outcome: Completed/Met Goal: Respiratory complications will improve Outcome: Completed/Met Goal: Cardiovascular complication will be avoided Outcome: Completed/Met   Problem: Activity: Goal: Risk for activity intolerance will decrease Outcome: Completed/Met   Problem: Nutrition: Goal: Adequate nutrition will be maintained Outcome: Completed/Met   Problem: Coping: Goal: Level of anxiety will decrease Outcome: Completed/Met   Problem: Elimination: Goal: Will not experience complications related to bowel motility Outcome: Completed/Met Goal: Will not experience complications related to urinary retention Outcome: Completed/Met   Problem: Pain Managment: Goal: General experience of comfort will improve Outcome: Completed/Met   Problem: Safety: Goal: Ability to remain free from injury will improve Outcome: Completed/Met   Problem: Skin Integrity: Goal: Risk for impaired skin integrity will decrease Outcome: Completed/Met   Problem: Education: Goal: Required Educational Video(s) Outcome: Completed/Met   Problem: Clinical Measurements: Goal: Ability to maintain clinical measurements within normal limits will improve Outcome: Completed/Met Goal: Postoperative complications will be avoided or minimized Outcome: Completed/Met   Problem: Skin Integrity: Goal: Demonstration of wound healing  without infection will improve Outcome: Completed/Met   

## 2021-09-16 NOTE — Discharge Summary (Signed)
Physician Discharge Summary  ?Patient ID: ?[name] ?MRN: [id number] ?DOB/AGE: [birth year] 71 y.o. ? ?Admit date: 09/15/2021 ?Discharge date: 09/16/2021 ? ?Admission Diagnoses: Incarcerated giant/type IV paraesophageal hernia ? ?Discharge Diagnoses:  ?Principal Problem: ?  S/P repair of paraesophageal hernia ? ? ?Discharged Condition: good ? 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  Her diet was advanced and she recovered uneventfully.  Postop day 1 upper GI confirmed completely intra-abdominal stomach without any evidence of leak or complication.  She was stable for discharge. ?Discharge Exam: ?See rounding note ? ?Disposition: Discharge disposition: 01-Home or Self Care ? ? ? ? ? ? ? ?Allergies as of 09/16/2021   ?No Known Allergies ?  ? ?  ?Medication List  ?  ? ?TAKE these medications   ? ?Ajovy 225 MG/1.5ML Soaj ?Generic drug: Fremanezumab-vfrm ?Inject 225 mg into the skin every 30 (thirty) days. ?  ?albuterol 108 (90 Base) MCG/ACT inhaler ?Commonly known as: VENTOLIN HFA ?Inhale 1-2 puffs into the lungs every 6 (six) hours as needed for wheezing or shortness of breath. ?  ?CALCIUM PO ?Take 1 tablet by mouth daily. ?  ?ibandronate 150 MG tablet ?Commonly known as: BONIVA ?TAKE 1 TABLET BY MOUTH EVERY 30 DAYS FOR BONE DENSITY. ?  ?lisinopril 20 MG tablet ?Commonly known as: ZESTRIL ?TAKE 1 TABLET BY MOUTH EVERY DAY FOR BLOOD PRESSURE ?  ?MULTIVITAMIN PO ?Take 1 tablet by mouth daily. ?  ?ondansetron 4 MG disintegrating tablet ?Commonly known as: ZOFRAN-ODT ?Take 1 tablet (4 mg total) by mouth every 8 (eight) hours as needed for nausea or vomiting. ?  ?rosuvastatin 5 MG tablet ?Commonly known as: CRESTOR ?TAKE 1 TABLET BY MOUTH EVERY DAY FOR CHOLESTEROL. ?  ?traMADol 50 MG tablet ?Commonly known as: Ultram ?Take 1 tablet (50 mg total) by mouth every 6 (six) hours as needed  for up to 5 days (Pain not relieved by Tylenol, ibuprofen, rest or ice). ?  ?traZODone 100 MG tablet ?Commonly known as: DESYREL ?Take 100 mg by mouth at bedtime. ?What changed: Another medication with the same name was removed. Continue taking this medication, and follow the directions you see here. ?  ? ?  ? ? Follow-up Information   ? ? [facility], [place] Follow up in 1 week(s).   ?Specialty: General Surgery ?Why: Nurse visit in one week to remove JP (right side) drain if output is less than 54m per day, and to remove sutures from G-tube (G tube will stay in for at least 8 weeks). ?Contact information: ?[address] ?[phone]? ? ?  ?  ? ? CClovis Riley MD Follow up in 2 week(s).   ?Specialty: General Surgery ?Why: Follow up with Dr. CKae Hellerin 2-3 weeks ?Contact information: ?[address] ?[id number]?[phone]? ? ?  ?  ? ?  ?  ? ?  ? ? ?Signed: ?Teresita Fanton ARich Brave?09/16/2021, 1:04 PM ? ? ?

## 2021-09-16 NOTE — Progress Notes (Signed)
Patient was given instructions on how to care for her JP drain and G -tube. Patient demonstrated correct knowledge of how to care for both drains. Discharge instructions were given to patient and husband. Details of follow up visit and diet were explained to patient with adequate teach back from patient. Patient was taken to main entrance in wheelchair by NT. ?

## 2021-10-19 DIAGNOSIS — F419 Anxiety disorder, unspecified: Secondary | ICD-10-CM

## 2021-10-20 MED ORDER — TRAZODONE HCL 100 MG PO TABS: 100.0000 mg | ORAL_TABLET | Freq: Every day | ORAL | 0 refills | Status: DC

## 2021-10-27 ENCOUNTER — Other Ambulatory Visit: Payer: Self-pay | Admitting: Primary Care

## 2021-10-27 DIAGNOSIS — Z1231 Encounter for screening mammogram for malignant neoplasm of breast: Secondary | ICD-10-CM

## 2021-10-28 ENCOUNTER — Ambulatory Visit (INDEPENDENT_AMBULATORY_CARE_PROVIDER_SITE_OTHER): Payer: PPO

## 2021-10-28 VITALS — Ht 60.5 in | Wt 122.0 lb

## 2021-10-28 DIAGNOSIS — Z0000 Encounter for general adult medical examination without abnormal findings: Secondary | ICD-10-CM

## 2021-10-30 DIAGNOSIS — H04123 Dry eye syndrome of bilateral lacrimal glands: Secondary | ICD-10-CM | POA: Diagnosis not present

## 2021-11-10 DIAGNOSIS — H04123 Dry eye syndrome of bilateral lacrimal glands: Secondary | ICD-10-CM | POA: Diagnosis not present

## 2021-11-17 ENCOUNTER — Ambulatory Visit
Admission: RE | Admit: 2021-11-17 | Discharge: 2021-11-17 | Disposition: A | Discharge status: Home / Self Care | Payer: PPO | Source: Ambulatory Visit | Attending: Primary Care | Admitting: Primary Care

## 2021-11-17 DIAGNOSIS — Z1231 Encounter for screening mammogram for malignant neoplasm of breast: Secondary | ICD-10-CM

## 2021-11-17 DIAGNOSIS — M85851 Other specified disorders of bone density and structure, right thigh: Secondary | ICD-10-CM | POA: Diagnosis not present

## 2021-11-17 DIAGNOSIS — E2839 Other primary ovarian failure: Secondary | ICD-10-CM

## 2021-11-17 DIAGNOSIS — M810 Age-related osteoporosis without current pathological fracture: Secondary | ICD-10-CM

## 2021-11-17 DIAGNOSIS — Z780 Asymptomatic menopausal state: Secondary | ICD-10-CM | POA: Diagnosis not present

## 2021-11-22 IMAGING — MG DIGITAL SCREENING BREAST BILAT IMPLANT W/ TOMO W/ CAD
8 of 13 series · 8 of 29 positions shown · non-contrast
Comparison: Previous exam(s).

CLINICAL DATA: Screening.

EXAM:
DIGITAL SCREENING BILATERAL MAMMOGRAM WITH IMPLANTS, CAD AND
TOMOSYNTHESIS
TECHNIQUE: Bilateral screening digital craniocaudal and mediolateral oblique
mammograms were obtained. Bilateral screening digital breast
tomosynthesis was performed. The images were evaluated with
computer-aided detection. Standard and/or implant displaced views
were performed.

[L CC]
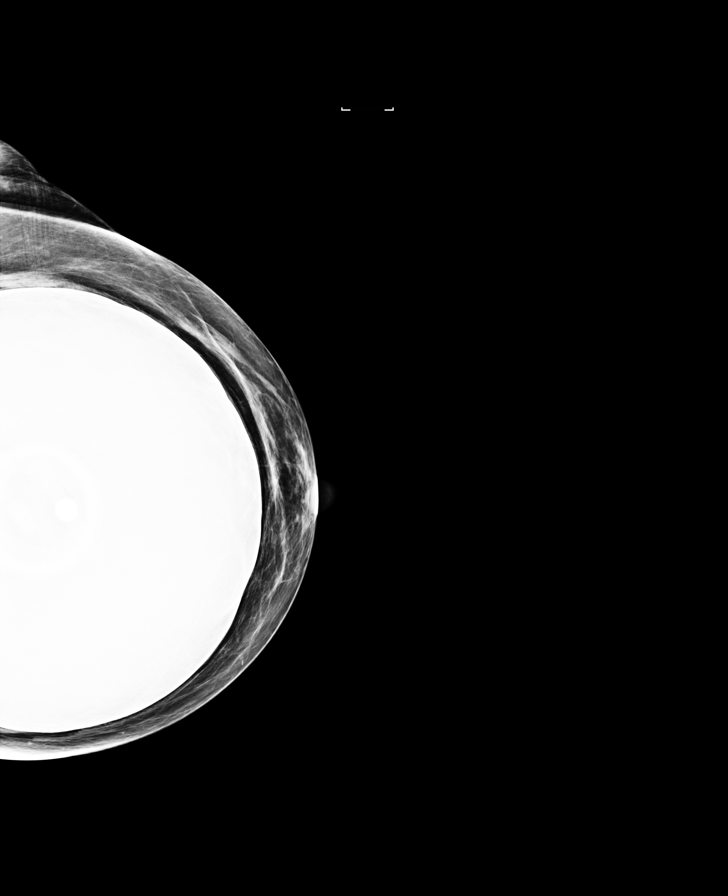

[R MLO (1 of 2)]
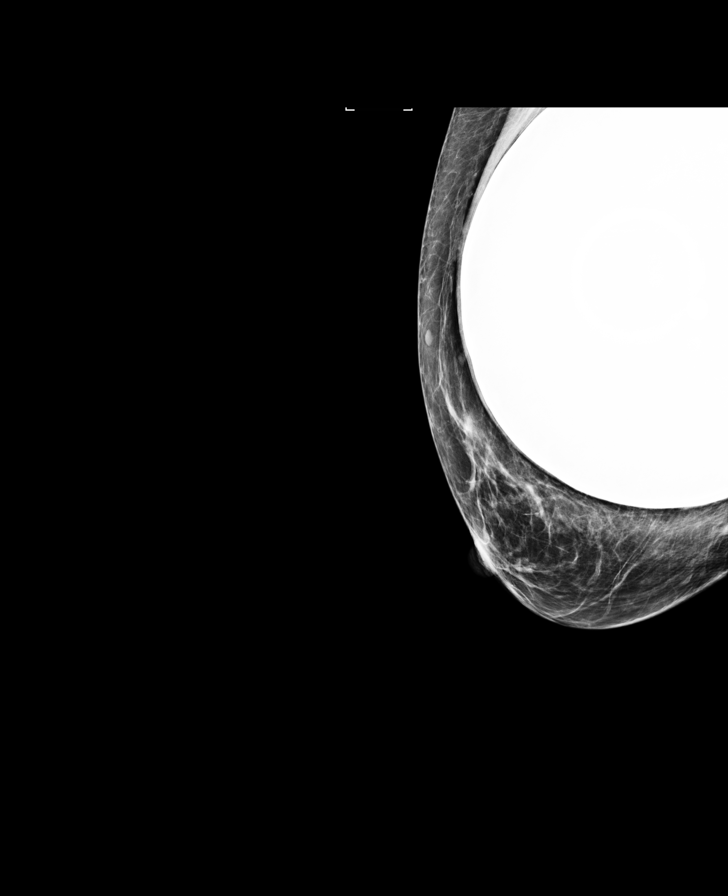

[R MLO (2 of 2)]
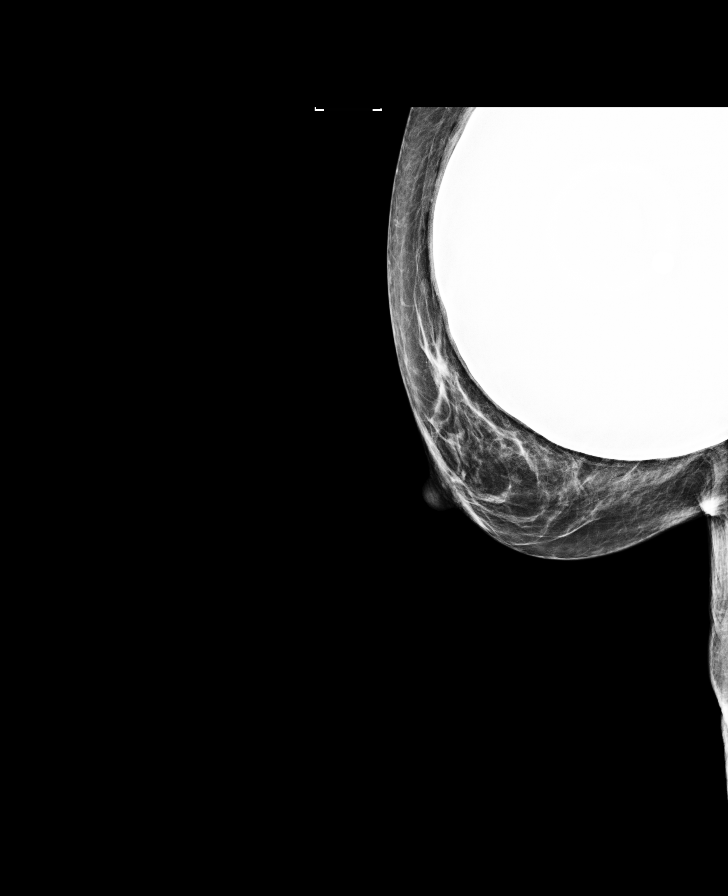

[L MLO]
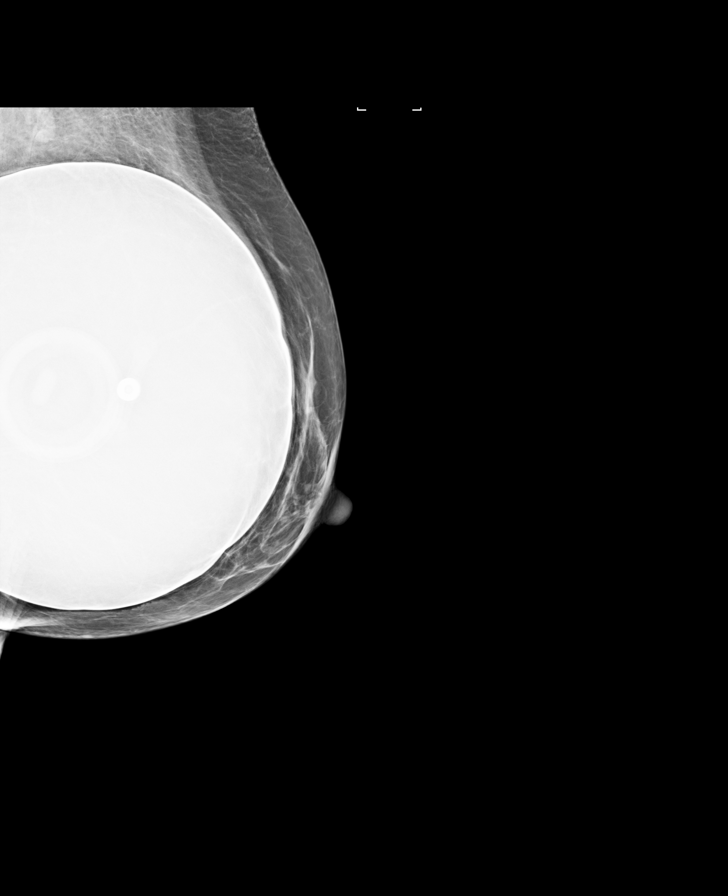

[R CC]
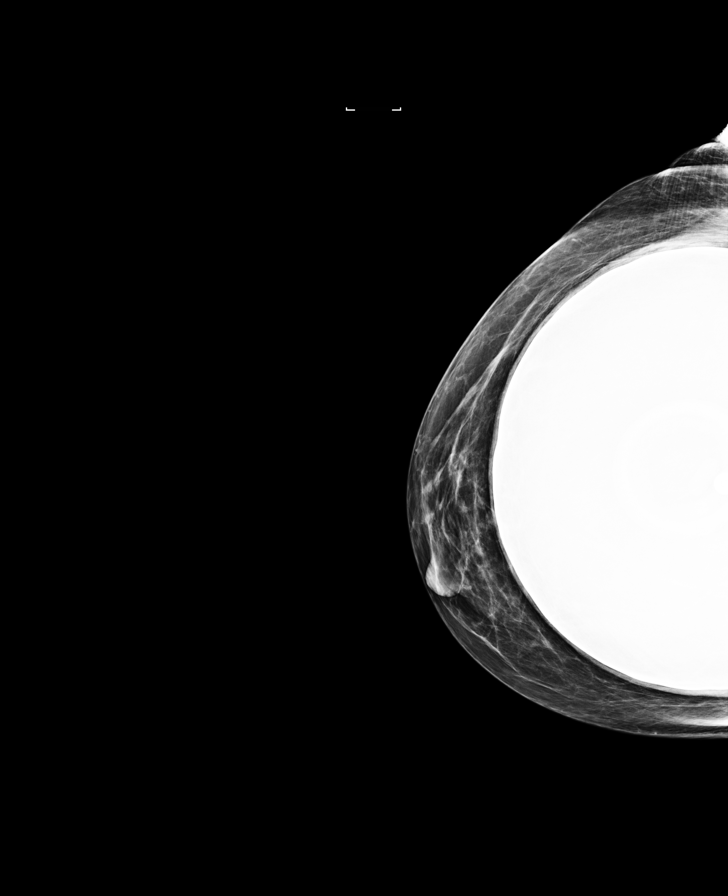

[R CC synth-2D]
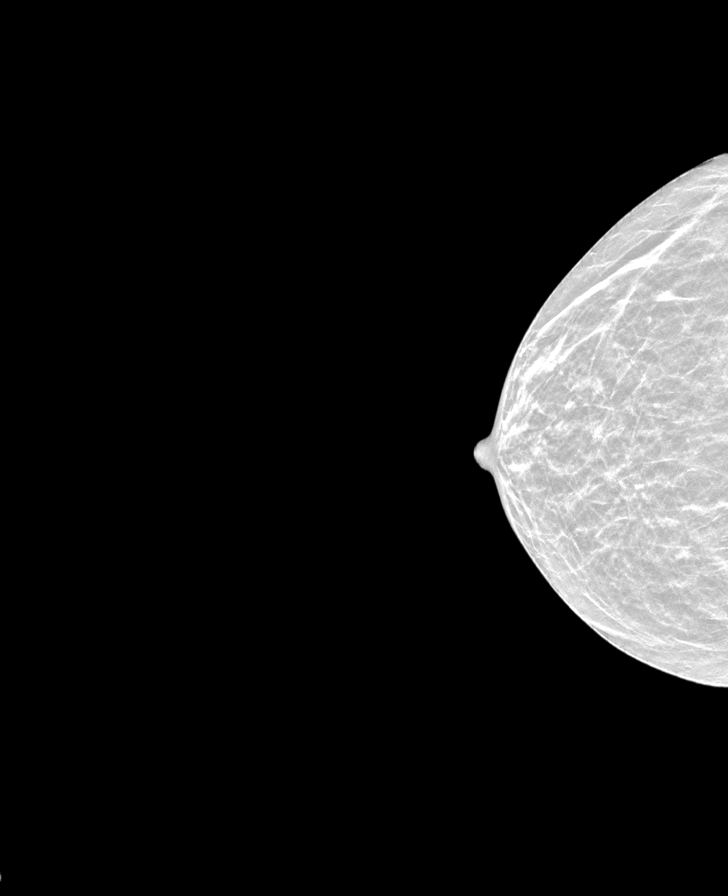

[R MLO synth-2D]
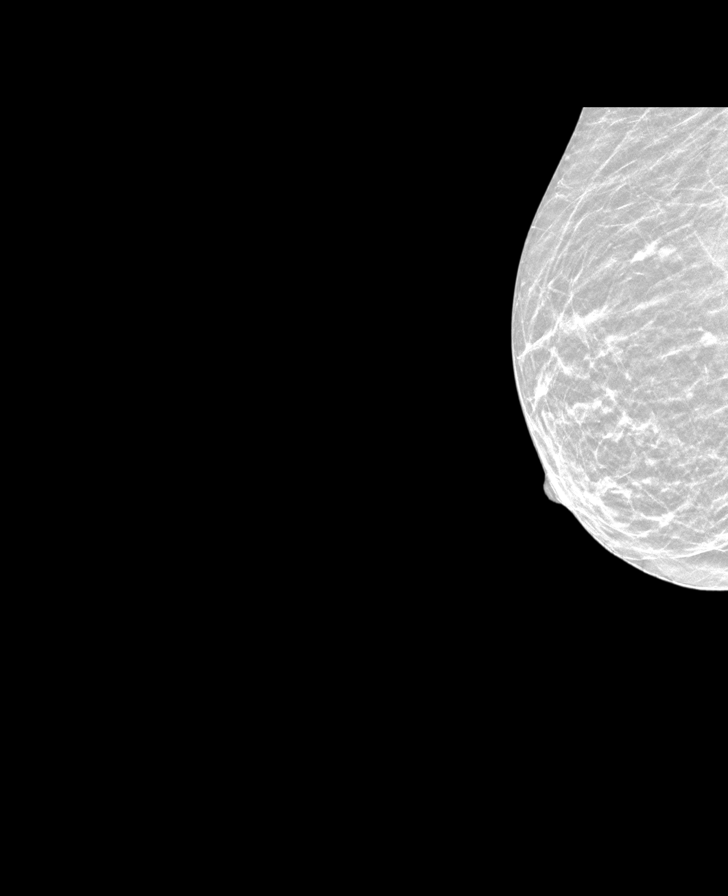

[L CC synth-2D]
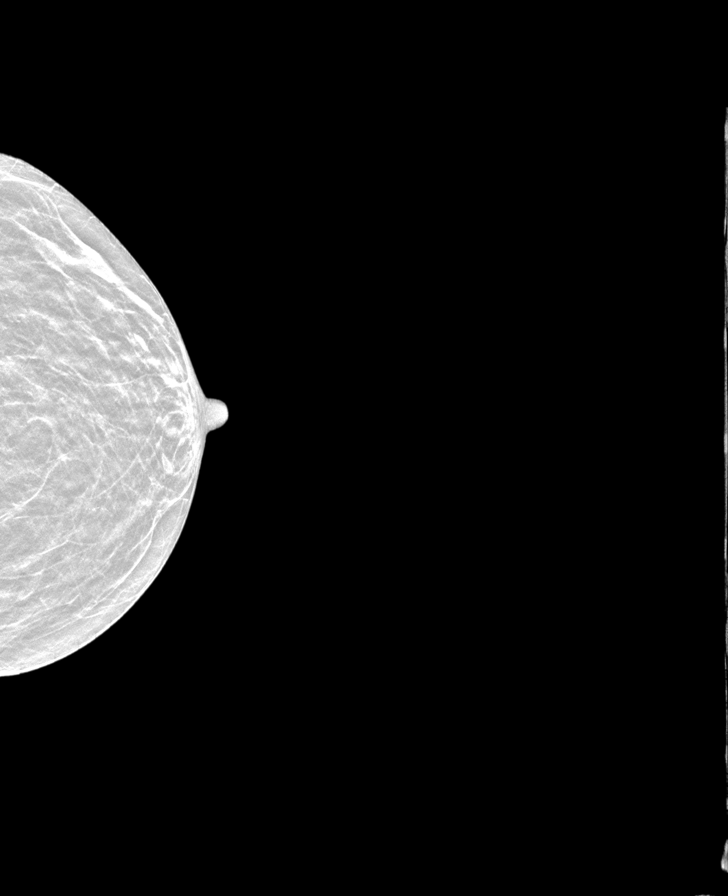

[8 of 29 positions shown; findings below may reference images not displayed]

ACR Breast Density Category b: There are scattered areas of
fibroglandular density.
FINDINGS: The patient has retropectoral implants. There are no findings
suspicious for malignancy.
IMPRESSION: No mammographic evidence of malignancy. A result letter of this
screening mammogram will be mailed directly to the patient.

RECOMMENDATION:
Screening mammogram in one year. (Code:SE-S-JMG)

BI-RADS CATEGORY  1:  Negative.

## 2021-11-25 ENCOUNTER — Ambulatory Visit: Payer: PPO | Admitting: Family Medicine

## 2021-11-25 ENCOUNTER — Other Ambulatory Visit: Payer: Self-pay | Admitting: Family Medicine

## 2021-11-25 DIAGNOSIS — G43709 Chronic migraine without aura, not intractable, without status migrainosus: Secondary | ICD-10-CM | POA: Diagnosis not present

## 2021-11-25 MED ORDER — ONABOTULINUMTOXINA 200 UNITS IJ SOLR
200.0000 [IU] | Freq: Once | INTRAMUSCULAR | Status: AC
  Administered 2021-11-25: 200 [IU] via INTRAMUSCULAR

## 2021-11-25 MED ORDER — AJOVY 225 MG/1.5ML ~~LOC~~ SOAJ: 225.0000 mg | SUBCUTANEOUS | 3 refills | Status: DC

## 2021-11-25 NOTE — Progress Notes (Signed)
11/25/21 ALL: [name] returns for Botox. She is doing well. Migraines seem to return about 2 weeks before Botox is due but she is able to abort with aspirin. She continues Ajovy every month.   09/02/2021 ALL: [name] returns for Botox. She continues Ajovy every 30 days. She reports headaches have been really bad over the past three weeks. She was seen in the ER yesterday for shob, abdominal pain, and decreased appetitive for the past 3 weeks. CT showed large hiatal hernia. Vitals and EKG normal. She is scheduled to see GI tomorrow. She request to continue with Botox procedure today to help settle down worsening headaches. No acute distress.   Medications tried and failed: Amovig (ineffective and weight gain) 08/2018 switched to [name] 07/2019 (works well), Cymbalta, Topamax (side effects), Depakote. Abortive: Rizatriptan (works well), Nurtec (works well), Imitrex (ineffective), Ubrelvy (ineffective), Zofran, Zyprexa.   06/09/2021 ALL: [name] returns for Botox. She continues to do well. She continues Ajovy samples provided by Dr [name]. She reports that she has not had any migrainous headaches since last being seen. Discussed Botox protocol.   03/05/2021 ALL: She returns for Botox. Rizatriptan helps with abortive needs. She has not had a headache since last procedure. She requests masseter and orb oculi injections as they help with clenching and retro orbital eye pain.   11/27/2020 AA: Stable: botox has "changed my life". +5 each masseter, +5 each orb oculi.  08/27/2020 AA:  +a. Needs dry needling 90% decrease in migraine frequency and severity since starting botox she may get one migraine in the last 1-2 weeks as it is wearinf off. She clenches. She feels the botox wears off near the end..Ask her about Ajovy* in August 2022 if she would like to reconsider but she is doing fantastic on botox.   Consent Form Botulism Toxin Injection For Chronic Migraine   Reviewed orally with patient, additionally signature is  on file:  Botulism toxin has been approved by the Federal drug administration for treatment of chronic migraine. Botulism toxin does not cure chronic migraine and it may not be effective in some patients.  The administration of botulism toxin is accomplished by injecting a small amount of toxin into the muscles of the neck and head. Dosage must be titrated for each individual. Any benefits resulting from botulism toxin tend to wear off after 3 months with a repeat injection required if benefit is to be maintained. Injections are usually done every 3-4 months with maximum effect peak achieved by about 2 or 3 weeks. Botulism toxin is expensive and you should be sure of what costs you will incur resulting from the injection.  The side effects of botulism toxin use for chronic migraine may include:   -Transient, and usually mild, facial weakness with facial injections  -Transient, and usually mild, head or neck weakness with head/neck injections  -Reduction or loss of forehead facial animation due to forehead muscle weakness  -Eyelid drooping  -Dry eye  -Pain at the site of injection or bruising at the site of injection  -Double vision  -Potential unknown long term risks   Contraindications: You should not have Botox if you are pregnant, nursing, allergic to albumin, have an infection, skin condition, or muscle weakness at the site of the injection, or have myasthenia gravis, Lambert-Eaton syndrome, or ALS.  It is also possible that as with any injection, there may be an allergic reaction or no effect from the medication. Reduced effectiveness after repeated injections is sometimes seen and rarely infection  at the injection site may occur. All care will be taken to prevent these side effects. If therapy is given over a long time, atrophy and wasting in the muscle injected may occur. Occasionally the patient's become refractory to treatment because they develop antibodies to the toxin. In this event,  therapy needs to be modified.  I have read the above information and consent to the administration of botulism toxin.    BOTOX PROCEDURE NOTE FOR MIGRAINE HEADACHE  Contraindications and precautions discussed with patient(above). Aseptic procedure was observed and patient tolerated procedure. Procedure performed by Debbora Presto, FNP-C.   The condition has existed for more than 6 months, and pt does not have a diagnosis of ALS, Myasthenia Gravis or Lambert-Eaton Syndrome.  Risks and benefits of injections discussed and pt agrees to proceed with the procedure.  Written consent obtained  These injections are medically necessary. Pt  receives good benefits from these injections. These injections do not cause sedations or hallucinations which the oral therapies may cause.   Description of procedure:  The patient was placed in a sitting position. The standard protocol was used for Botox as follows, with 5 units of Botox injected at each site:  -Procerus muscle, midline injection  -Corrugator muscle, bilateral injection  -Frontalis muscle, bilateral injection, with 2 sites each side, medial injection was performed in the upper one third of the frontalis muscle, in the region vertical from the medial inferior edge of the superior orbital rim. The lateral injection was again in the upper one third of the forehead vertically above the lateral limbus of the cornea, 1.5 cm lateral to the medial injection site.  -Temporalis muscle injection, 4 sites, bilaterally. The first injection was 3 cm above the tragus of the ear, second injection site was 1.5 cm to 3 cm up from the first injection site in line with the tragus of the ear. The third injection site was 1.5-3 cm forward between the first 2 injection sites. The fourth injection site was 1.5 cm posterior to the second injection site. 5th site laterally in the temporalis  muscleat the level of the outer canthus.  -Occipitalis muscle injection, 3 sites,  bilaterally. The first injection was done one half way between the occipital protuberance and the tip of the mastoid process behind the ear. The second injection site was done lateral and superior to the first, 1 fingerbreadth from the first injection. The third injection site was 1 fingerbreadth superiorly and medially from the first injection site.  -Cervical paraspinal muscle injection, 2 sites, bilaterally. The first injection site was 1 cm from the midline of the cervical spine, 3 cm inferior to the lower border of the occipital protuberance. The second injection site was 1.5 cm superiorly and laterally to the first injection site.  -Trapezius muscle injection was performed at 3 sites, bilaterally. The first injection site was in the upper trapezius muscle halfway between the inflection point of the neck, and the acromion. The second injection site was one half way between the acromion and the first injection site. The third injection was done between the first injection site and the inflection point of the neck.    Will return for repeat injection in 3 months.   A total of 200 units of Botox was prepared, 155 units of Botox was injected as documented above, any Botox not injected was wasted. The patient tolerated the procedure well, there were no complications of the above procedure.

## 2021-11-25 NOTE — Progress Notes (Signed)
Botox- 200 units x 1 vial Lot: [id number] Expiration: 06/2024 NDC: 4235-3614-43  Bacteriostatic 0.9% Sodium Chloride- 43m total Lot: [id number]: 01/03/2023 NDC: [address]-32 Dx: GI71.245B/B

## 2021-12-02 ENCOUNTER — Other Ambulatory Visit: Payer: Self-pay | Admitting: Student

## 2021-12-02 ENCOUNTER — Other Ambulatory Visit (HOSPITAL_COMMUNITY): Payer: Self-pay | Admitting: Student

## 2021-12-02 DIAGNOSIS — R1111 Vomiting without nausea: Secondary | ICD-10-CM

## 2021-12-04 ENCOUNTER — Other Ambulatory Visit: Payer: Self-pay | Admitting: Primary Care

## 2021-12-04 DIAGNOSIS — M810 Age-related osteoporosis without current pathological fracture: Secondary | ICD-10-CM

## 2021-12-15 ENCOUNTER — Ambulatory Visit (HOSPITAL_COMMUNITY): Payer: PPO

## 2021-12-16 ENCOUNTER — Telehealth: Payer: Self-pay | Admitting: Neurology

## 2021-12-16 NOTE — Telephone Encounter (Signed)
Spoke to patent went to pick up Ajovy was told insurance is not covering Ajovy. Pt has been getting samples from [facility] and this was the first time she filled the prescription. Pt states cost is $2600 out of pocket . Informed patient was not sure since she was getting Botox insurance may not cover both medications. Pt wants to see if Debbora Presto, NP is able to prescribe another preventive medication Pt is going to call insurance in am to see if her insurance will cover another injectable . Pt will call back and let us know  Will forward to Amy to make her aware.

## 2021-12-16 NOTE — Telephone Encounter (Signed)
Pt is calling and wants to talk to nurse about Fremanezumab-vfrm (AJOVY) 225 MG/1.5ML SOAJ.

## 2021-12-18 NOTE — Telephone Encounter (Signed)
Returned call to pt.  She said that after speaking to her insurance that preferred is [place] or [place].  I relayed that Dr Jaynee Eagles will usually pick emagality as the other option.  This will be every 30 days as ajovy.  She takes her last ajovy sample tomorrow.  This will give [place] 30 days for PA.  Pt verbalized understanding of plan.

## 2021-12-18 NOTE — Addendum Note (Signed)
Addended by: Brandon Melnick on: 12/18/2021 03:31 PM   Modules accepted: Orders

## 2021-12-18 NOTE — Telephone Encounter (Signed)
Pt has called stating she was responding to a call from RN re: her medication.

## 2021-12-24 DIAGNOSIS — L821 Other seborrheic keratosis: Secondary | ICD-10-CM | POA: Diagnosis not present

## 2021-12-24 DIAGNOSIS — B372 Candidiasis of skin and nail: Secondary | ICD-10-CM | POA: Diagnosis not present

## 2021-12-24 DIAGNOSIS — L570 Actinic keratosis: Secondary | ICD-10-CM | POA: Diagnosis not present

## 2021-12-24 DIAGNOSIS — Z85828 Personal history of other malignant neoplasm of skin: Secondary | ICD-10-CM | POA: Diagnosis not present

## 2021-12-24 DIAGNOSIS — L603 Nail dystrophy: Secondary | ICD-10-CM | POA: Diagnosis not present

## 2021-12-24 DIAGNOSIS — L814 Other melanin hyperpigmentation: Secondary | ICD-10-CM | POA: Diagnosis not present

## 2021-12-25 ENCOUNTER — Encounter (HOSPITAL_COMMUNITY): Payer: Self-pay

## 2021-12-25 ENCOUNTER — Other Ambulatory Visit (HOSPITAL_COMMUNITY): Payer: PPO

## 2022-01-21 ENCOUNTER — Telehealth: Payer: Self-pay | Admitting: Surgery

## 2022-01-21 ENCOUNTER — Other Ambulatory Visit (HOSPITAL_COMMUNITY): Payer: Self-pay | Admitting: Student

## 2022-01-21 ENCOUNTER — Ambulatory Visit (HOSPITAL_COMMUNITY)
Admission: RE | Admit: 2022-01-21 | Discharge: 2022-01-21 | Disposition: A | Discharge status: Home / Self Care | Payer: PPO | Source: Ambulatory Visit | Attending: Student | Admitting: Student

## 2022-01-21 DIAGNOSIS — K219 Gastro-esophageal reflux disease without esophagitis: Secondary | ICD-10-CM | POA: Diagnosis not present

## 2022-01-21 DIAGNOSIS — R1111 Vomiting without nausea: Secondary | ICD-10-CM | POA: Insufficient documentation

## 2022-01-21 NOTE — Telephone Encounter (Signed)
Called patient to review upper GI, we went over the report in detail and recommendations including antireflux lifestyle measures, continue PPI, trial of Reglan, will place GI referral for consultation regarding management of reflux, poor gastric emptying, and for upper endoscopy.

## 2022-01-22 ENCOUNTER — Telehealth: Payer: Self-pay | Admitting: *Deleted

## 2022-01-22 ENCOUNTER — Telehealth: Payer: Self-pay | Admitting: Neurology

## 2022-01-22 DIAGNOSIS — G43709 Chronic migraine without aura, not intractable, without status migrainosus: Secondary | ICD-10-CM

## 2022-01-22 MED ORDER — EMGALITY 120 MG/ML ~~LOC~~ SOAJ: 1.0000 mL | SUBCUTANEOUS | 3 refills | Status: DC

## 2022-01-22 NOTE — Telephone Encounter (Signed)
Called [facility] [place] at [phone]. Spoke w/ tech. States Ajovy not covered. There are no prescriptions on file for Aimovig or Emgality.  Per phone note from 12/18/21/SY,RN: pt was to switch to [employer]. [facility] and d/c'd Ajovy on file.  Submitted urgent PA Emgality on CMM. Key: [id number]. Waiting on determination from [place] [employer].

## 2022-01-22 NOTE — Telephone Encounter (Signed)
Called pt back. Reminded her about call last month w/ SY,RN. She is agreeable to try Emgality. Aware rx called in. She will come by today to pick up sample while insurance auth pending.  1 box Emgality '120mg'$  Lot: [id number] Expiration: 06/01/23 Pt instruction print out provided to pt.

## 2022-01-22 NOTE — Telephone Encounter (Signed)
Pt called wanting to know if a Rx can be called in for Amovig until the Ajovy is approved. Please advise.

## 2022-01-22 NOTE — Telephone Encounter (Signed)
Received fax from insurance that PA approved 01/22/22-07/23/22. I called pt and let her know. She verbalized understanding and appreciation.

## 2022-01-22 NOTE — Patient Outreach (Signed)
  Care Coordination   Initial Visit Note   01/22/2022 Name: [name] MRN: [id number] DOB: [birth year]  [name] is a 71 y.o. year old female who sees Pleas Koch, NP for primary care. I spoke with  [name] by phone today.  What matters to the patients health and wellness today?  No concerns expressed.  RN discussed [place], RN, SW, and Pharmacist. Patient declined services.     Goals Addressed             This Visit's Progress    Patient advised to follow up with PCP on AWV and Vaccines          SDOH assessments and interventions completed:  Yes     Care Coordination Interventions Activated:  Yes  Care Coordination Interventions:  Yes, provided   Follow up plan: No further intervention required.   Encounter Outcome:  Pt. [place] [phone]

## 2022-01-26 ENCOUNTER — Encounter (HOSPITAL_COMMUNITY)
Admission: RE | Admit: 2022-01-26 | Discharge: 2022-01-26 | Disposition: A | Discharge status: Home / Self Care | Payer: PPO | Source: Ambulatory Visit | Attending: Student | Admitting: Student

## 2022-01-26 DIAGNOSIS — R1111 Vomiting without nausea: Secondary | ICD-10-CM | POA: Diagnosis not present

## 2022-01-26 MED ORDER — TECHNETIUM TC 99M SULFUR COLLOID
2.1000 | Freq: Once | INTRAVENOUS | Status: AC | PRN
  Administered 2022-01-28: 2.1 via ORAL

## 2022-01-28 DIAGNOSIS — Z0389 Encounter for observation for other suspected diseases and conditions ruled out: Secondary | ICD-10-CM | POA: Diagnosis not present

## 2022-02-11 ENCOUNTER — Telehealth: Payer: Self-pay

## 2022-02-11 NOTE — Telephone Encounter (Signed)
Patient is due for insurance reverification for botox.  Toxin: Botox 200 units  [id number] and [id number]   DX: G43.709 Chronic migraine without aura without status migrainosus, not intractable  Provider: Debbora Presto, NP

## 2022-02-13 ENCOUNTER — Other Ambulatory Visit (HOSPITAL_COMMUNITY): Payer: Self-pay

## 2022-02-13 NOTE — Telephone Encounter (Signed)
Botox[employer] Benefit Verification [id number] Submitted!

## 2022-02-18 ENCOUNTER — Encounter: Payer: Self-pay | Admitting: Primary Care

## 2022-02-18 ENCOUNTER — Ambulatory Visit (INDEPENDENT_AMBULATORY_CARE_PROVIDER_SITE_OTHER): Payer: PPO | Admitting: Primary Care

## 2022-02-18 VITALS — BP 100/62 | HR 70 | Temp 98.1°F | Ht 60.5 in | Wt 123.0 lb

## 2022-02-18 DIAGNOSIS — F32A Depression, unspecified: Secondary | ICD-10-CM | POA: Diagnosis not present

## 2022-02-18 DIAGNOSIS — G4700 Insomnia, unspecified: Secondary | ICD-10-CM | POA: Insufficient documentation

## 2022-02-18 DIAGNOSIS — F419 Anxiety disorder, unspecified: Secondary | ICD-10-CM | POA: Diagnosis not present

## 2022-02-18 MED ORDER — TRAZODONE HCL 100 MG PO TABS: 200.0000 mg | ORAL_TABLET | Freq: Every day | ORAL | 0 refills | Status: DC

## 2022-02-18 NOTE — Telephone Encounter (Signed)
PA not Needed.  Buy and [employer]

## 2022-02-18 NOTE — Assessment & Plan Note (Signed)
Uncontrolled, likely secondary to anxiety, unsure why her surgery would provoke symptoms. Reviewed labs from May 2023 including TSH and CBC. Unremarkable.  Start with a dose increase of Trazodone to 200 mg HS. Consider addition of Melatonin 5-10 mg HS.  She will update.

## 2022-02-18 NOTE — Assessment & Plan Note (Signed)
Uncontrolled but does not wish to pursue treatment. Continue to monitor.

## 2022-02-18 NOTE — Telephone Encounter (Signed)
   Botox[employer] verification benefits scanned to chart.

## 2022-02-18 NOTE — Patient Instructions (Signed)
We increased the dose of your Trazodone to 200 mg. I sent a new prescription to your pharmacy.  Please update me as discussed.  It was a pleasure to see you today!

## 2022-02-18 NOTE — Progress Notes (Signed)
Subjective:    Patient ID: [name], female    DOB: 08-24-1950, 71 y.o.   MRN: [id number]  HPI  [name] is a very pleasant 71 y.o. female with a history of generalized anxiety disorder, depression, chronic pain, hypertension, migraines who presents today to discuss sleep disturbance.  Previously followed with psychiatry and managed on Viibryd and trazodone.  Also managed on duloxetine, hydroxyzine at different times.  She is no longer following with psychiatry, hasn't seen them since last winter as she wasn't feeling any better. Stopped all of her medications cold [place]. Historically has done well on Trazodone 100 mg until May 2023 after her hiatal hernia surgery. Prior to her surgery she slept very well with Trazodone 100 mg. She's recently increased her Trazodone to 150 mg nightly without improvement.   She goes to bed around 10 pm, will lay awake for hours tossing and turning, will eventually fall asleep for a few hours, will awake again and will lay awake. She believes she's getting inturrupted sleep for a total of five hours per night.   She has mind racing thoughts while laying in bed. She continues to struggle with chronic anxiety but doesn't feel worse off treatment.. She's tried listening to calming noises, meditation music without improvement. She will also get out of bed if she cannot sleep. She's not taken anything OTC or Rx for sleep.   She's tried Ambien in the past which caused sleep walking.   Review of Systems  Respiratory:  Negative for shortness of breath.   Cardiovascular:  Negative for chest pain.  Psychiatric/Behavioral:  Positive for sleep disturbance. The patient is nervous/anxious.          Past Medical History:  Diagnosis Date   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   Anxiety    Asthma    as a child   CAP (community acquired pneumonia)    Cataract 2018   removed both eyes   COLONIC POLYPS, HX OF 11/15/2009   DEPRESSION 11/14/2009    GASTROJEJ ULCR UNS ACUT/CHRN W/O HEMOR PERF/OBST 11/06/2009   GERD (gastroesophageal reflux disease)    not on meds   Headache(784.0) 11/15/2009   Heart murmur    never bothered pt. always been told this   HEART MURMUR, HX OF 11/15/2009   HIATAL HERNIA 10/08/2009   Large   HIP FRACTURE, LEFT 11/14/2009   History of hiatal hernia    HYPERLIPIDEMIA 11/14/2009   HYPERTENSION 11/14/2009   OSTEOPOROSIS 11/14/2009   Prolonged Q-T interval on ECG 09/02/2021   WEIGHT GAIN 01/10/2010    Social History   Socioeconomic History   Marital status: Married    Spouse name: [name]   Number of children: 3   Years of education: BA   Highest education level: Not on file  Occupational History   Occupation: IT consultant and Spa  Tobacco Use   Smoking status: Former    Years: 15.00    Types: Cigarettes    Quit date: 05/04/1982    Years since quitting: 39.8   Smokeless tobacco: Never   Tobacco comments:    Married, 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[place], 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 (stress)  Vaping Use   Vaping Use: Never used  Substance and Sexual Activity   Alcohol use: Yes    Alcohol/week: 1.0 standard drink of alcohol    Types: 1 Glasses of wine per week    Comment: nightly   Drug use: No  Sexual activity: Not Currently    Birth control/protection: None  Other Topics Concern   Not on file  Social History Narrative   Lives with husband   Caffeine use: 1 cup or less    Right handed   Social Determinants of Health   Financial Resource Strain: Low Risk  (10/28/2021)   Overall Financial Resource Strain (CARDIA)    Difficulty of Paying Living Expenses: Not hard at all  Food Insecurity: No Food Insecurity (10/28/2021)   Hunger Vital Sign    Worried About Running Out of Food in the Last Year: Never true    Ran Out of Food in the Last Year: Never true  Transportation Needs: No Transportation Needs (10/28/2021)   PRAPARE - Armed forces logistics/support/administrative officer (Medical): No    Lack of Transportation (Non-Medical): No  Physical Activity: Inactive (10/28/2021)   Exercise Vital Sign    Days of Exercise per Week: 0 days    Minutes of Exercise per Session: 0 min  Stress: No Stress Concern Present (10/28/2021)   [place]    Feeling of Stress : Not at all  Social Connections: Moderately Isolated (10/28/2021)   Social Connection and Isolation Panel [NHANES]    Frequency of Communication with Friends and Family: More than three times a week    Frequency of Social Gatherings with Friends and Family: More than three times a week    Attends Religious Services: Never    Marine scientist or Organizations: No    Attends Archivist Meetings: Never    Marital Status: Married  Human resources officer Violence: Not At Risk (10/28/2021)   Humiliation, Afraid, Rape, and Kick questionnaire    Fear of Current or Ex-Partner: No    Emotionally Abused: No    Physically Abused: No    Sexually Abused: No    Past Surgical History:  Procedure Laterality Date   APPENDECTOMY     AUGMENTATION MAMMAPLASTY Bilateral    BACK SURGERY  2003   Bleeding ulcers  10/2009   Broken Hip Left 06/2001   CATARACT EXTRACTION Bilateral 01/2017   COLONOSCOPY N/A 12/30/2016   Procedure: COLONOSCOPY WITH PROPOFOL;  Surgeon: Leighton Ruff, MD;  Location: [facility];  Service: Endoscopy;  Laterality: N/A;   COLONOSCOPY  2011   inadequate prep, polyps   GASTROSTOMY N/A 09/15/2021   Procedure: INSERTION OF GASTROSTOMY TUBE;  Surgeon: Clovis Riley, MD;  Location: [facility];  Service: General;  Laterality: N/A;   NECK SURGERY  2004 & 2006   x's 2   POLYPECTOMY     RECTAL PROLAPSE REPAIR  01/2017   XI ROBOTIC ASSISTED PARAESOPHAGEAL HERNIA REPAIR N/A 09/15/2021   Procedure: XI ROBOTIC ASSISTED PARAESOPHAGEAL HERNIA REPAIR WITH GASTROPEXY;  Surgeon: Clovis Riley, MD;  Location: [facility]  [facility];  Service: General;  Laterality: N/A;    Family History  Problem Relation Age of Onset   Bone cancer Mother    Hypertension Other    Allergic rhinitis Neg Hx    Asthma Neg Hx    Urticaria Neg Hx    Colon cancer Neg Hx    Colon polyps Neg Hx    Esophageal cancer Neg Hx    Rectal cancer Neg Hx    Stomach cancer Neg Hx    Breast cancer Neg Hx     No Known Allergies  Current Outpatient Medications on File Prior to Visit  Medication Sig Dispense Refill  albuterol (VENTOLIN HFA) 108 (90 Base) MCG/ACT inhaler Inhale 1-2 puffs into the lungs every 6 (six) hours as needed for wheezing or shortness of breath. 6.7 g 0   CALCIUM PO Take 1 tablet by mouth daily.      ibandronate (BONIVA) 150 MG tablet TAKE 1 TABLET BY MOUTH EVERY 30 DAYS FOR BONE DENSITY. 3 tablet 0   lisinopril (ZESTRIL) 20 MG tablet TAKE 1 TABLET BY MOUTH EVERY DAY FOR BLOOD PRESSURE 90 tablet 3   metoCLOPramide (REGLAN) 10 MG tablet Take 10 mg by mouth 4 (four) times daily.     Multiple Vitamins-Minerals (MULTIVITAMIN PO) Take 1 tablet by mouth daily.      rosuvastatin (CRESTOR) 5 MG tablet TAKE 1 TABLET BY MOUTH EVERY DAY FOR CHOLESTEROL. 90 tablet 2   Galcanezumab-gnlm (EMGALITY) 120 MG/ML SOAJ Inject 1 mL into the skin every 30 (thirty) days. (Patient not taking: Reported on 02/18/2022) 3 mL 3   ondansetron (ZOFRAN-ODT) 4 MG disintegrating tablet Take 1 tablet (4 mg total) by mouth every 8 (eight) hours as needed for nausea or vomiting. (Patient not taking: Reported on 10/28/2021) 20 tablet 0   No current facility-administered medications on file prior to visit.    BP 100/62   Pulse 70   Temp 98.1 F (36.7 C) (Temporal)   Ht 5' 0.5" (1.537 m)   Wt 123 lb (55.8 kg)   SpO2 97%   BMI 23.63 kg/m  Objective:   Physical Exam Cardiovascular:     Rate and Rhythm: Normal rate and regular rhythm.  Pulmonary:     Effort: Pulmonary effort is normal.     Breath sounds: Normal breath sounds.  Musculoskeletal:      Cervical back: Neck supple.  Skin:    General: Skin is warm and dry.           Assessment & Plan:   Problem List Items Addressed This Visit       Other   Anxiety and depression    Uncontrolled but does not wish to pursue treatment. Continue to monitor.       Relevant Medications   traZODone (DESYREL) 100 MG tablet   Insomnia - Primary    Uncontrolled, likely secondary to anxiety, unsure why her surgery would provoke symptoms. Reviewed labs from May 2023 including TSH and CBC. Unremarkable.  Start with a dose increase of Trazodone to 200 mg HS. Consider addition of Melatonin 5-10 mg HS.  She will update.       Relevant Medications   traZODone (DESYREL) 100 MG tablet       Pleas Koch, NP

## 2022-02-19 NOTE — Progress Notes (Signed)
02/23/22 ALL: [name] returns for Botox. She called to report cost for Ajovy was too expensive. We switched her to [name] but cost remained too expensive. She reports migraines are stable. We did not have samples, today. I advised she call insurance agents to discuss possible supplement plans that may help with coverage versus applying for patient assistance.   11/25/2021 ALL: [name] returns for Botox. She is doing well. Migraines seem to return about 2 weeks before Botox is due but she is able to abort with aspirin. She continues Ajovy every month.   09/02/2021 ALL: [name] returns for Botox. She continues Ajovy every 30 days. She reports headaches have been really bad over the past three weeks. She was seen in the ER yesterday for shob, abdominal pain, and decreased appetitive for the past 3 weeks. CT showed large hiatal hernia. Vitals and EKG normal. She is scheduled to see GI tomorrow. She request to continue with Botox procedure today to help settle down worsening headaches. No acute distress.   Medications tried and failed: Amovig (ineffective and weight gain) 08/2018 switched to Num[place] 07/2019 (works well), Cymbalta, Topamax (side effects), Depakote. Abortive: Rizatriptan (works well), Nurtec (works well), Imitrex (ineffective), Ubrelvy (ineffective), Zofran, Zyprexa.   06/09/2021 ALL: [name] returns for Botox. She continues to do well. She continues Ajovy samples provided by Dr Jaynee Eagles. She reports that she has not had any migrainous headaches since last being seen. Discussed Botox protocol.   03/05/2021 ALL: She returns for Botox. Rizatriptan helps with abortive needs. She has not had a headache since last procedure. She requests masseter and orb oculi injections as they help with clenching and retro orbital eye pain.   11/27/2020 AA: Stable: botox has "changed my life". +5 each masseter, +5 each orb oculi.  08/27/2020 AA:  +a. Needs dry needling 90% decrease in migraine frequency and severity since  starting botox she may get one migraine in the last 1-2 weeks as it is wearinf off. She clenches. She feels the botox wears off near the end..Ask her about Ajovy* in August 2022 if she would like to reconsider but she is doing fantastic on botox.   Consent Form Botulism Toxin Injection For Chronic Migraine   Reviewed orally with patient, additionally signature is on file:  Botulism toxin has been approved by the Federal drug administration for treatment of chronic migraine. Botulism toxin does not cure chronic migraine and it may not be effective in some patients.  The administration of botulism toxin is accomplished by injecting a small amount of toxin into the muscles of the neck and head. Dosage must be titrated for each individual. Any benefits resulting from botulism toxin tend to wear off after 3 months with a repeat injection required if benefit is to be maintained. Injections are usually done every 3-4 months with maximum effect peak achieved by about 2 or 3 weeks. Botulism toxin is expensive and you should be sure of what costs you will incur resulting from the injection.  The side effects of botulism toxin use for chronic migraine may include:   -Transient, and usually mild, facial weakness with facial injections  -Transient, and usually mild, head or neck weakness with head/neck injections  -Reduction or loss of forehead facial animation due to forehead muscle weakness  -Eyelid drooping  -Dry eye  -Pain at the site of injection or bruising at the site of injection  -Double vision  -Potential unknown long term risks   Contraindications: You should not have Botox if you are  pregnant, nursing, allergic to albumin, have an infection, skin condition, or muscle weakness at the site of the injection, or have myasthenia gravis, Lambert-Eaton syndrome, or ALS.  It is also possible that as with any injection, there may be an allergic reaction or no effect from the medication. Reduced  effectiveness after repeated injections is sometimes seen and rarely infection at the injection site may occur. All care will be taken to prevent these side effects. If therapy is given over a long time, atrophy and wasting in the muscle injected may occur. Occasionally the patient's become refractory to treatment because they develop antibodies to the toxin. In this event, therapy needs to be modified.  I have read the above information and consent to the administration of botulism toxin.    BOTOX PROCEDURE NOTE FOR MIGRAINE HEADACHE  Contraindications and precautions discussed with patient(above). Aseptic procedure was observed and patient tolerated procedure. Procedure performed by Jo Presto, FNP-C.   The condition has existed for more than 6 months, and pt does not have a diagnosis of ALS, Myasthenia Gravis or Lambert-Eaton Syndrome.  Risks and benefits of injections discussed and pt agrees to proceed with the procedure.  Written consent obtained  These injections are medically necessary. Pt  receives good benefits from these injections. These injections do not cause sedations or hallucinations which the oral therapies may cause.   Description of procedure:  The patient was placed in a sitting position. The standard protocol was used for Botox as follows, with 5 units of Botox injected at each site:  -Procerus muscle, midline injection  -Corrugator muscle, bilateral injection  -Frontalis muscle, bilateral injection, with 2 sites each side, medial injection was performed in the upper one third of the frontalis muscle, in the region vertical from the medial inferior edge of the superior orbital rim. The lateral injection was again in the upper one third of the forehead vertically above the lateral limbus of the cornea, 1.5 cm lateral to the medial injection site.  -Temporalis muscle injection, 4 sites, bilaterally. The first injection was 3 cm above the tragus of the ear, second injection  site was 1.5 cm to 3 cm up from the first injection site in line with the tragus of the ear. The third injection site was 1.5-3 cm forward between the first 2 injection sites. The fourth injection site was 1.5 cm posterior to the second injection site. 5th site laterally in the temporalis  muscleat the level of the outer canthus.  -Occipitalis muscle injection, 3 sites, bilaterally. The first injection was done one half way between the occipital protuberance and the tip of the mastoid process behind the ear. The second injection site was done lateral and superior to the first, 1 fingerbreadth from the first injection. The third injection site was 1 fingerbreadth superiorly and medially from the first injection site.  -Cervical paraspinal muscle injection, 2 sites, bilaterally. The first injection site was 1 cm from the midline of the cervical spine, 3 cm inferior to the lower border of the occipital protuberance. The second injection site was 1.5 cm superiorly and laterally to the first injection site.  -Trapezius muscle injection was performed at 3 sites, bilaterally. The first injection site was in the upper trapezius muscle halfway between the inflection point of the neck, and the acromion. The second injection site was one half way between the acromion and the first injection site. The third injection was done between the first injection site and the inflection point of the neck.  Will return for repeat injection in 3 months.   A total of 200 units of Botox was prepared, 155 units of Botox was injected as documented above, any Botox not injected was wasted. The patient tolerated the procedure well, there were no complications of the above procedure.

## 2022-02-23 ENCOUNTER — Ambulatory Visit: Payer: PPO | Admitting: Family Medicine

## 2022-02-23 VITALS — BP 138/85 | HR 71 | Ht 60.0 in | Wt 123.4 lb

## 2022-02-23 DIAGNOSIS — G43709 Chronic migraine without aura, not intractable, without status migrainosus: Secondary | ICD-10-CM | POA: Diagnosis not present

## 2022-02-23 MED ORDER — ONABOTULINUMTOXINA 200 UNITS IJ SOLR
155.0000 [IU] | Freq: Once | INTRAMUSCULAR | Status: AC
  Administered 2022-02-23: 155 [IU] via INTRAMUSCULAR

## 2022-02-26 DIAGNOSIS — G4700 Insomnia, unspecified: Secondary | ICD-10-CM

## 2022-02-27 MED ORDER — MIRTAZAPINE 7.5 MG PO TABS: 7.5000 mg | ORAL_TABLET | Freq: Every day | ORAL | 0 refills | Status: DC

## 2022-02-28 ENCOUNTER — Other Ambulatory Visit: Payer: Self-pay | Admitting: Primary Care

## 2022-02-28 DIAGNOSIS — M810 Age-related osteoporosis without current pathological fracture: Secondary | ICD-10-CM

## 2022-03-12 ENCOUNTER — Other Ambulatory Visit: Payer: Self-pay | Admitting: Primary Care

## 2022-03-12 DIAGNOSIS — E785 Hyperlipidemia, unspecified: Secondary | ICD-10-CM

## 2022-03-17 DIAGNOSIS — G4700 Insomnia, unspecified: Secondary | ICD-10-CM

## 2022-03-17 DIAGNOSIS — F32A Depression, unspecified: Secondary | ICD-10-CM

## 2022-03-18 MED ORDER — AMITRIPTYLINE HCL 10 MG PO TABS: 10.0000 mg | ORAL_TABLET | Freq: Every day | ORAL | 0 refills | Status: DC

## 2022-03-23 ENCOUNTER — Other Ambulatory Visit: Payer: Self-pay | Admitting: Primary Care

## 2022-03-23 DIAGNOSIS — G4700 Insomnia, unspecified: Secondary | ICD-10-CM

## 2022-03-31 DIAGNOSIS — G4700 Insomnia, unspecified: Secondary | ICD-10-CM

## 2022-04-01 MED ORDER — AMITRIPTYLINE HCL 10 MG PO TABS: 20.0000 mg | ORAL_TABLET | Freq: Every day | ORAL | 0 refills | Status: DC

## 2022-04-07 ENCOUNTER — Encounter: Payer: Self-pay | Admitting: Primary Care

## 2022-04-07 ENCOUNTER — Ambulatory Visit (INDEPENDENT_AMBULATORY_CARE_PROVIDER_SITE_OTHER): Payer: PPO | Admitting: Primary Care

## 2022-04-07 VITALS — BP 102/74 | HR 75 | Temp 97.9°F | Ht 60.0 in | Wt 123.0 lb

## 2022-04-07 DIAGNOSIS — R7303 Prediabetes: Secondary | ICD-10-CM

## 2022-04-07 DIAGNOSIS — M810 Age-related osteoporosis without current pathological fracture: Secondary | ICD-10-CM | POA: Diagnosis not present

## 2022-04-07 DIAGNOSIS — J4520 Mild intermittent asthma, uncomplicated: Secondary | ICD-10-CM

## 2022-04-07 DIAGNOSIS — I10 Essential (primary) hypertension: Secondary | ICD-10-CM | POA: Diagnosis not present

## 2022-04-07 DIAGNOSIS — E785 Hyperlipidemia, unspecified: Secondary | ICD-10-CM | POA: Diagnosis not present

## 2022-04-07 DIAGNOSIS — F419 Anxiety disorder, unspecified: Secondary | ICD-10-CM | POA: Diagnosis not present

## 2022-04-07 DIAGNOSIS — F32A Depression, unspecified: Secondary | ICD-10-CM | POA: Diagnosis not present

## 2022-04-07 DIAGNOSIS — Z0000 Encounter for general adult medical examination without abnormal findings: Secondary | ICD-10-CM | POA: Diagnosis not present

## 2022-04-07 DIAGNOSIS — G43709 Chronic migraine without aura, not intractable, without status migrainosus: Secondary | ICD-10-CM

## 2022-04-07 DIAGNOSIS — G4700 Insomnia, unspecified: Secondary | ICD-10-CM | POA: Diagnosis not present

## 2022-04-07 LAB — LIPID PANEL
Cholesterol: 167 mg/dL (ref 0–200)
HDL: 66.3 mg/dL (ref >39.00)
LDL Cholesterol: 78 mg/dL (ref 0–99)
NonHDL: 100.36
Total CHOL/HDL Ratio: 3
Triglycerides: 110 mg/dL (ref 0.0–149.0)
VLDL: 22 mg/dL (ref 0.0–40.0)

## 2022-04-07 LAB — HEMOGLOBIN A1C: Hgb A1c MFr Bld: 5.8 % (ref 4.6–6.5)

## 2022-04-07 MED ORDER — AMITRIPTYLINE HCL 25 MG PO TABS: 25.0000 mg | ORAL_TABLET | Freq: Every day | ORAL | 0 refills | Status: DC

## 2022-04-07 NOTE — Assessment & Plan Note (Signed)
Controlled.  Continue lisinopril 20 mg daily.

## 2022-04-07 NOTE — Assessment & Plan Note (Signed)
Uncontrolled.  She will be setting up an appointment with psychiatry soon. Do suspect that her insomnia stems from uncontrolled anxiety, discussed this today.  Increase amitriptyline to 25 mg at bedtime.

## 2022-04-07 NOTE — Assessment & Plan Note (Signed)
Slight improvement with amitriptyline. Increase amitriptyline dose to 25 mg at bedtime.  She will be seeing psychiatry soon for management of anxiety.

## 2022-04-07 NOTE — Assessment & Plan Note (Signed)
Continue rosuvastatin 5 mg daily. Repeat lipid panel pending 

## 2022-04-07 NOTE — Assessment & Plan Note (Signed)
Controlled. °Infrequent use of albuterol inhaler. ° °Continue albuterol inhaler as needed. °

## 2022-04-07 NOTE — Assessment & Plan Note (Signed)
Repeat A1c pending. 

## 2022-04-07 NOTE — Assessment & Plan Note (Signed)
Reviewed recent bone density scan, improved to right femur total.  Continue ibandronate 150 mg monthly.

## 2022-04-07 NOTE — Assessment & Plan Note (Signed)
Immunizations UTD. Mammogram and bone density scan UTD Colonoscopy up-to-date, due 2025.  Discussed the importance of a healthy diet and regular exercise in order for weight loss, and to reduce the risk of further co-morbidity.  Exam stable. Labs pending.  Follow up in 1 year for repeat physical.

## 2022-04-07 NOTE — Assessment & Plan Note (Signed)
Controlled.  Continue Botox injections per neurology.

## 2022-04-07 NOTE — Progress Notes (Signed)
Subjective:    Patient ID: [name], female    DOB: October 28, 1950, 71 y.o.   MRN: [id number]  HPI  [name] is a very pleasant 71 y.o. female who presents today for complete physical and follow up of chronic conditions.  Immunizations: -Tetanus: 2014 -Influenza: Completed this season  -Shingles: Completed Shingrix series -Pneumonia: Prevnar 20 in 2022, Prevnar 13 in 2017, Pneumovax in 2018  Diet: [place].  Exercise: Treadmill aerobics 6 days weekly.  Eye exam: Completes annually  Dental exam: Completes semi-annually   Mammogram: Completed in July 2023  Colonoscopy: Completed in 2022, due 2025 Dexa: Completed in July 2023  BP Readings from Last 3 Encounters:  04/07/22 102/74  02/23/22 138/85  02/18/22 100/62         Review of Systems  Constitutional:  Positive for fatigue.  Eyes:  Negative for visual disturbance.  Respiratory:  Negative for shortness of breath.   Cardiovascular:  Negative for chest pain.  Gastrointestinal:  Negative for constipation and diarrhea.  Genitourinary:  Negative for difficulty urinating.  Neurological:  Positive for headaches. Negative for dizziness.  Psychiatric/Behavioral:  Positive for sleep disturbance. The patient is nervous/anxious.          Past Medical History:  Diagnosis Date   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   Anxiety    Asthma    as a child   CAP (community acquired pneumonia)    Cataract 2018   removed both eyes   COLONIC POLYPS, HX OF 11/15/2009   DEPRESSION 11/14/2009   GASTROJEJ ULCR UNS ACUT/CHRN W/O HEMOR PERF/OBST 11/06/2009   GERD (gastroesophageal reflux disease)    not on meds   Headache(784.0) 11/15/2009   Heart murmur    never bothered pt. always been told this   HEART MURMUR, HX OF 11/15/2009   HIATAL HERNIA 10/08/2009   Large   HIP FRACTURE, LEFT 11/14/2009   History of hiatal hernia    HYPERLIPIDEMIA 11/14/2009   HYPERTENSION 11/14/2009   OSTEOPOROSIS 11/14/2009    Prolonged Q-T interval on ECG 09/02/2021   WEIGHT GAIN 01/10/2010    Social History   Socioeconomic History   Marital status: Married    Spouse name: [name]   Number of children: 3   Years of education: BA   Highest education level: Not on file  Occupational History   Occupation: IT consultant and [employer]  Tobacco Use   Smoking status: Former    Years: 15.00    Types: Cigarettes    Quit date: 05/04/1982    Years since quitting: 39.9   Smokeless tobacco: Never   Tobacco comments:    Married, 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[place], 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 (stress)  Vaping Use   Vaping Use: Never used  Substance and Sexual Activity   Alcohol use: Yes    Alcohol/week: 1.0 standard drink of alcohol    Types: 1 Glasses of wine per week    Comment: nightly   Drug use: No   Sexual activity: Not Currently    Birth control/protection: None  Other Topics Concern   Not on file  Social History Narrative   Lives with husband   Caffeine use: 1 cup or less    Right handed   Social Determinants of Health   Financial Resource Strain: Low Risk  (10/28/2021)   Overall Financial Resource Strain (CARDIA)    Difficulty of Paying Living Expenses: Not hard at all  Food Insecurity: No Food  Insecurity (10/28/2021)   Hunger Vital Sign    Worried About Running Out of Food in the Last Year: Never true    [place] in the Last Year: Never true  Transportation Needs: No Transportation Needs (10/28/2021)   PRAPARE - Hydrologist (Medical): No    Lack of Transportation (Non-Medical): No  Physical Activity: Inactive (10/28/2021)   Exercise Vital Sign    Days of Exercise per Week: 0 days    Minutes of Exercise per Session: 0 min  Stress: No Stress Concern Present (10/28/2021)   [place]    Feeling of Stress : Not at all  Social Connections: Moderately  Isolated (10/28/2021)   Social Connection and Isolation Panel [NHANES]    Frequency of Communication with Friends and Family: More than three times a week    Frequency of Social Gatherings with Friends and Family: More than three times a week    Attends Religious Services: Never    Marine scientist or Organizations: No    Attends Archivist Meetings: Never    Marital Status: Married  Human resources officer Violence: Not At Risk (10/28/2021)   Humiliation, Afraid, Rape, and Kick questionnaire    Fear of Current or Ex-Partner: No    Emotionally Abused: No    Physically Abused: No    Sexually Abused: No    Past Surgical History:  Procedure Laterality Date   APPENDECTOMY     AUGMENTATION MAMMAPLASTY Bilateral    BACK SURGERY  2003   Bleeding ulcers  10/2009   Broken Hip Left 06/2001   CATARACT EXTRACTION Bilateral 01/2017   COLONOSCOPY N/A 12/30/2016   Procedure: COLONOSCOPY WITH PROPOFOL;  Surgeon: Leighton Ruff, MD;  Location: [facility];  Service: Endoscopy;  Laterality: N/A;   COLONOSCOPY  2011   inadequate prep, polyps   GASTROSTOMY N/A 09/15/2021   Procedure: INSERTION OF GASTROSTOMY TUBE;  Surgeon: Clovis Riley, MD;  Location: [facility];  Service: General;  Laterality: N/A;   NECK SURGERY  2004 & 2006   x's 2   POLYPECTOMY     RECTAL PROLAPSE REPAIR  01/2017   XI ROBOTIC ASSISTED PARAESOPHAGEAL HERNIA REPAIR N/A 09/15/2021   Procedure: XI ROBOTIC ASSISTED PARAESOPHAGEAL HERNIA REPAIR WITH GASTROPEXY;  Surgeon: Clovis Riley, MD;  Location: [facility];  Service: General;  Laterality: N/A;    Family History  Problem Relation Age of Onset   Bone cancer Mother    Hypertension Other    Allergic rhinitis Neg Hx    Asthma Neg Hx    Urticaria Neg Hx    Colon cancer Neg Hx    Colon polyps Neg Hx    Esophageal cancer Neg Hx    Rectal cancer Neg Hx    Stomach cancer Neg Hx    Breast cancer Neg Hx     No Known Allergies  Current Outpatient Medications on File  Prior to Visit  Medication Sig Dispense Refill   albuterol (VENTOLIN HFA) 108 (90 Base) MCG/ACT inhaler Inhale 1-2 puffs into the lungs every 6 (six) hours as needed for wheezing or shortness of breath. 6.7 g 0   CALCIUM PO Take 1 tablet by mouth daily.      ibandronate (BONIVA) 150 MG tablet TAKE 1 TABLET BY MOUTH EVERY 30 DAYS FOR BONE DENSITY. 3 tablet 0   lisinopril (ZESTRIL) 20 MG tablet TAKE 1 TABLET BY MOUTH EVERY DAY FOR BLOOD PRESSURE  90 tablet 3   Multiple Vitamins-Minerals (MULTIVITAMIN PO) Take 1 tablet by mouth daily.      rosuvastatin (CRESTOR) 5 MG tablet TAKE 1 TABLET BY MOUTH EVERY DAY FOR CHOLESTEROL 90 tablet 0   No current facility-administered medications on file prior to visit.    BP 102/74   Pulse 75   Temp 97.9 F (36.6 C) (Temporal)   Ht 5' (1.524 m)   Wt 123 lb (55.8 kg)   SpO2 99%   BMI 24.02 kg/m  Objective:   Physical Exam HENT:     Right Ear: Tympanic membrane and ear canal normal.     Left Ear: Tympanic membrane and ear canal normal.     Nose: Nose normal.  Eyes:     Conjunctiva/sclera: Conjunctivae normal.     Pupils: Pupils are equal, round, and reactive to light.  Neck:     Thyroid: No thyromegaly.  Cardiovascular:     Rate and Rhythm: Normal rate and regular rhythm.     Heart sounds: No murmur heard. Pulmonary:     Effort: Pulmonary effort is normal.     Breath sounds: Normal breath sounds. No rales.  Abdominal:     General: Bowel sounds are normal.     Palpations: Abdomen is soft.     Tenderness: There is no abdominal tenderness.  Musculoskeletal:        General: Normal range of motion.     Cervical back: Neck supple.  Lymphadenopathy:     Cervical: No cervical adenopathy.  Skin:    General: Skin is warm and dry.     Findings: No rash.  Neurological:     Mental Status: She is alert and oriented to person, place, and time.     Cranial Nerves: No cranial nerve deficit.     Deep Tendon Reflexes: Reflexes are normal and symmetric.   Psychiatric:        Mood and Affect: Mood normal.           Assessment & Plan:   Problem List Items Addressed This Visit       Cardiovascular and Mediastinum   Essential hypertension    Controlled.  Continue lisinopril 20 mg daily.      Chronic migraine without aura without status migrainosus, not intractable    Controlled.  Continue Botox injections per neurology.      Relevant Medications   amitriptyline (ELAVIL) 25 MG tablet     Respiratory   Asthma    Controlled.  Infrequent use of albuterol inhaler. Continue albuterol inhaler as needed.        Musculoskeletal and Integument   Osteoporosis    Reviewed recent bone density scan, improved to right femur total.  Continue ibandronate 150 mg monthly.        Other   Hyperlipidemia    Continue rosuvastatin 5 mg daily. Repeat lipid panel pending.      Relevant Orders   Lipid panel   Anxiety and depression    Uncontrolled.  She will be setting up an appointment with psychiatry soon. Do suspect that her insomnia stems from uncontrolled anxiety, discussed this today.  Increase amitriptyline to 25 mg at bedtime.      Relevant Medications   amitriptyline (ELAVIL) 25 MG tablet   Preventative health care - Primary    Immunizations UTD. Mammogram and bone density scan UTD Colonoscopy up-to-date, due 2025.  Discussed the importance of a healthy diet and regular exercise in order for weight loss, and to reduce  the risk of further co-morbidity.  Exam stable. Labs pending.  Follow up in 1 year for repeat physical.       Prediabetes    Repeat A1c pending.      Relevant Orders   Hemoglobin A1c   Insomnia    Slight improvement with amitriptyline. Increase amitriptyline dose to 25 mg at bedtime.  She will be seeing psychiatry soon for management of anxiety.      Relevant Medications   amitriptyline (ELAVIL) 25 MG tablet       Pleas Koch, NP

## 2022-04-17 ENCOUNTER — Other Ambulatory Visit: Payer: Self-pay | Admitting: Primary Care

## 2022-04-17 DIAGNOSIS — I10 Essential (primary) hypertension: Secondary | ICD-10-CM

## 2022-04-20 ENCOUNTER — Ambulatory Visit: Payer: PPO | Admitting: Nurse Practitioner

## 2022-04-29 ENCOUNTER — Other Ambulatory Visit: Payer: Self-pay | Admitting: Primary Care

## 2022-04-29 DIAGNOSIS — G4700 Insomnia, unspecified: Secondary | ICD-10-CM

## 2022-05-11 ENCOUNTER — Ambulatory Visit (INDEPENDENT_AMBULATORY_CARE_PROVIDER_SITE_OTHER): Payer: PPO | Admitting: Adult Health

## 2022-05-11 ENCOUNTER — Encounter: Payer: Self-pay | Admitting: Adult Health

## 2022-05-11 VITALS — BP 131/79 | HR 92 | Ht 60.0 in | Wt 123.0 lb

## 2022-05-11 DIAGNOSIS — F411 Generalized anxiety disorder: Secondary | ICD-10-CM | POA: Diagnosis not present

## 2022-05-11 MED ORDER — SERTRALINE HCL 100 MG PO TABS: ORAL_TABLET | ORAL | 2 refills | Status: DC

## 2022-05-11 NOTE — Progress Notes (Signed)
[facility] MD/PA/NP Initial Note  05/11/2022 4:03 PM [name]  MRN:  [id number]  Chief Complaint:   HPI:   Patient seen today for initial psychiatric evaluation.   Referred by PCP for anxiety and depression. Feeling more anxious overall.   Reports working with previous psychiatric professionals with limited benefit from medications.  Describes mood today as "ok". Pleasant. Denies tearfulness. Mood symptoms - reports increased anxiety - "Ive always been anxious". Stating "I feel anxious all the time". Feels tense - always. Denies depression and irritability. Denies panic attacks. Reports being a worrier - "I've always been a worrier". Reports over thinking - "things from the past". Denies obsessive thoughts or acts. Mood is consistent. Has struggled over the years to find a medication to help with the anxiety, but has not been unsuccessful. Stating "I'm willing to consider other options. Stable interest and motivation. Taking medications as prescribed.  Energy levels stable. Active, has a regular exercise routine 6 days a week. Enjoys some usual interests and activities. Married. Lives with husband. Has 3 children - grandchildren. Spending time with family. Appetite decreased - never gets hungry. Weight stable - maintaining - 123 pounds - 60". Sleeps well most night - had struggled over the past 8 months until she started on Amitriptyline '20mg'$ . Averages 8 hours over the past week. Focus and concentration stable. Completing tasks. Managing aspects of household. Retired. Denies SI or HI.  Denies AH or VH. Denies self harm. Denies substance use.   Previous medication trials: Trazadone, Amitriptyline, Wellbutrin, Gabapentin '900mg'$  daily, Prozac '80mg'$  daily, Cymbalta '40mg'$  daily, Hydroxyzine '75mg'$  daily, Clonidine 0.'2mg'$  daily and Lamictal '100mg'$  daily, Viibyrd '40mg'$  daily, Clonazepam, Buspar  Visit Diagnosis:    ICD-10-CM   1. Generalized anxiety disorder  F41.1 sertraline (ZOLOFT) 100 MG  tablet      Past Psychiatric History: Family history of mental illness.   Past Medical History:  Past Medical History:  Diagnosis Date   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   Anxiety    Asthma    as a child   CAP (community acquired pneumonia)    Cataract 2018   removed both eyes   COLONIC POLYPS, HX OF 11/15/2009   DEPRESSION 11/14/2009   GASTROJEJ ULCR UNS ACUT/CHRN W/O HEMOR PERF/OBST 11/06/2009   GERD (gastroesophageal reflux disease)    not on meds   Headache(784.0) 11/15/2009   Heart murmur    never bothered pt. always been told this   HEART MURMUR, HX OF 11/15/2009   HIATAL HERNIA 10/08/2009   Large   HIP FRACTURE, LEFT 11/14/2009   History of hiatal hernia    HYPERLIPIDEMIA 11/14/2009   HYPERTENSION 11/14/2009   OSTEOPOROSIS 11/14/2009   Prolonged Q-T interval on ECG 09/02/2021   WEIGHT GAIN 01/10/2010    Past Surgical History:  Procedure Laterality Date   APPENDECTOMY     AUGMENTATION MAMMAPLASTY Bilateral    BACK SURGERY  2003   Bleeding ulcers  10/2009   Broken Hip Left 06/2001   CATARACT EXTRACTION Bilateral 01/2017   COLONOSCOPY N/A 12/30/2016   Procedure: COLONOSCOPY WITH PROPOFOL;  Surgeon: Leighton Ruff, MD;  [facility];  Service: Endoscopy;  Laterality: N/A;   COLONOSCOPY  2011   inadequate prep, polyps   GASTROSTOMY N/A 09/15/2021   Procedure: INSERTION OF GASTROSTOMY TUBE;  Surgeon: Clovis Riley, MD;  Location: [facility];  Service: General;  Laterality: N/A;   NECK SURGERY  2004 & 2006   x's 2   POLYPECTOMY     RECTAL PROLAPSE REPAIR  01/2017   XI ROBOTIC ASSISTED PARAESOPHAGEAL HERNIA REPAIR N/A 09/15/2021   Procedure: XI ROBOTIC ASSISTED PARAESOPHAGEAL HERNIA REPAIR WITH GASTROPEXY;  Surgeon: Clovis Riley, MD;  Location: [facility];  Service: General;  Laterality: N/A;    Family Psychiatric History: Denies any family history of mental illness.   Family History:  Family History  Problem Relation Age of Onset   Bone cancer  Mother    Hypertension Other    Allergic rhinitis Neg Hx    Asthma Neg Hx    Urticaria Neg Hx    Colon cancer Neg Hx    Colon polyps Neg Hx    Esophageal cancer Neg Hx    Rectal cancer Neg Hx    Stomach cancer Neg Hx    Breast cancer Neg Hx     Social History:  Social History   Socioeconomic History   Marital status: Married    Spouse name: [name]   Number of children: 3   Years of education: BA   Highest education level: Not on file  Occupational History   Occupation: IT consultant and Spa  Tobacco Use   Smoking status: Former    Years: 15.00    Types: Cigarettes    Quit date: 05/04/1982    Years since quitting: 40.0   Smokeless tobacco: Never   Tobacco comments:    Married, 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[place], 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 (stress)  Vaping Use   Vaping Use: Never used  Substance and Sexual Activity   Alcohol use: Yes    Alcohol/week: 1.0 standard drink of alcohol    Types: 1 Glasses of wine per week    Comment: nightly   Drug use: No   Sexual activity: Not Currently    Birth control/protection: None  Other Topics Concern   Not on file  Social History Narrative   Lives with husband   Caffeine use: 1 cup or less    Right handed   Social Determinants of Health   Financial Resource Strain: Low Risk  (10/28/2021)   Overall Financial Resource Strain (CARDIA)    Difficulty of Paying Living Expenses: Not hard at all  Food Insecurity: No Food Insecurity (10/28/2021)   Hunger Vital Sign    Worried About Running Out of Food in the Last Year: Never true    Ran Out of Food in the Last Year: Never true  Transportation Needs: No Transportation Needs (10/28/2021)   PRAPARE - Hydrologist (Medical): No    Lack of Transportation (Non-Medical): No  Physical Activity: Inactive (10/28/2021)   Exercise Vital Sign    Days of Exercise per Week: 0 days    Minutes of Exercise per Session: 0  min  Stress: No Stress Concern Present (10/28/2021)   [place]    Feeling of Stress : Not at all  Social Connections: Moderately Isolated (10/28/2021)   Social Connection and Isolation Panel [NHANES]    Frequency of Communication with Friends and Family: More than three times a week    Frequency of Social Gatherings with Friends and Family: More than three times a week    Attends Religious Services: Never    Marine scientist or Organizations: No    Attends Archivist Meetings: Never    Marital Status: Married    Allergies: No Known Allergies  Metabolic Disorder Labs: Lab Results  Component Value  Date   HGBA1C 5.8 04/07/2022   No results found for: "PROLACTIN" Lab Results  Component Value Date   CHOL 167 04/07/2022   TRIG 110.0 04/07/2022   HDL 66.30 04/07/2022   CHOLHDL 3 04/07/2022   VLDL 22.0 04/07/2022   LDLCALC 78 04/07/2022   LDLCALC 85 04/03/2021   Lab Results  Component Value Date   TSH 1.838 09/01/2021   TSH 1.20 12/02/2016    Therapeutic Level Labs: No results found for: "LITHIUM" No results found for: "VALPROATE" No results found for: "CBMZ"  Current Medications: Current Outpatient Medications  Medication Sig Dispense Refill   sertraline (ZOLOFT) 100 MG tablet Take 1/2 tablet daily x 7 days, then increase to '100mg'$  daily. 30 tablet 2   albuterol (VENTOLIN HFA) 108 (90 Base) MCG/ACT inhaler Inhale 1-2 puffs into the lungs every 6 (six) hours as needed for wheezing or shortness of breath. 6.7 g 0   amitriptyline (ELAVIL) 25 MG tablet Take 1 tablet (25 mg total) by mouth at bedtime. For sleep 90 tablet 0   CALCIUM PO Take 1 tablet by mouth daily.      ibandronate (BONIVA) 150 MG tablet TAKE 1 TABLET BY MOUTH EVERY 30 DAYS FOR BONE DENSITY. 3 tablet 0   lisinopril (ZESTRIL) 20 MG tablet TAKE 1 TABLET BY MOUTH EVERY DAY FOR BLOOD PRESSURE 90 tablet 3   Multiple Vitamins-Minerals  (MULTIVITAMIN PO) Take 1 tablet by mouth daily.      rosuvastatin (CRESTOR) 5 MG tablet TAKE 1 TABLET BY MOUTH EVERY DAY FOR CHOLESTEROL 90 tablet 0   No current facility-administered medications for this visit.    Medication Side Effects: none  Orders placed this visit:  No orders of the defined types were placed in this encounter.   Psychiatric Specialty Exam:  Review of Systems  Musculoskeletal:  Negative for gait problem.  Neurological:  Negative for tremors.  Psychiatric/Behavioral:         Please refer to HPI    There were no vitals taken for this visit.There is no height or weight on file to calculate BMI.  General Appearance: Casual and Neat  Eye Contact:  Good  Speech:  Clear and Coherent and Normal Rate  Volume:  Normal  Mood:  Anxious  Affect:  Appropriate and Congruent  Thought Process:  Coherent and Descriptions of Associations: Intact  Orientation:  Full (Time, Place, and Person)  Thought Content: Logical   Suicidal Thoughts:  No  Homicidal Thoughts:  No  Memory:  WNL  Judgement:  Good  Insight:  Good  Psychomotor Activity:  Normal  Concentration:  Concentration: Good and Attention Span: Good  Recall:  Good  Fund of Knowledge: Good  Language: Good  Assets:  Communication Skills Desire for Improvement Financial Resources/Insurance Housing Intimacy Leisure Time Physical Health Resilience Social Support Talents/Skills Transportation Vocational/Educational  ADL's:  Intact  Cognition: WNL  Prognosis:  Good   Screenings:  AUDIT    Flowsheet Row Clinical Support from 10/28/2021 in [place] at [facility] from 10/24/2020 in [place] at [facility]  Alcohol Use Disorder Identification Test Final Score (AUDIT) 4 4      GAD-7    Flowsheet Row Office Visit from 06/24/2017 in [place] at [facility]  Total GAD-7 Score 10      [place] Office Visit from 04/07/2022 in [facility]  at [place] from 10/28/2021 in [place] at [place] from 10/24/2020 in  Therapist, music at [facility] Visit from 03/26/2020 in [place] at [place] from 03/16/2019 in [place] at [facility]  PHQ-2 Total Score 0 0 0 0 0  PHQ-9 Total Score 9 -- 0 0 0      Flowsheet Row Admission (Discharged) from 09/15/2021 in [facility] [place] Testing 60 from 09/10/2021 in [place] ED from 09/01/2021 in [place] No Risk No Risk No Risk       Receiving Psychotherapy: No   Treatment Plan/Recommendations:  Plan:  PDMP reviewed  D/C Viibryd '40mg'$  daily - started a week ago.  Add Zoloft '100mg'$  daily - take 1/2 tablet daily for 7 days, then increase to one tablet daily.  Also taking Elavil '40mg'$  at bedtime.  RTC 4 weeks  Patient advised to contact office with any questions, adverse effects, or acute worsening in signs and symptoms.    Aloha Gell, NP

## 2022-05-14 NOTE — Progress Notes (Signed)
05/19/22 ALL: [name] returns for Botox. She is doing fairly well. She reports migraines are usually ery well managed for first 8 weeks after procedure but then return. She has had 25 migraine days in the past month. She is taking Excedrin daily for abortive therapy. She could not afford to continue Emgality. She has not inquired about patient assistance. Nurtec worked well for her in the past. Pensions consultant. No samples were available so given 10 tabs of Nurtec.   02/23/2022 ALL: [name] returns for Botox. She called to report cost for Ajovy was too expensive. We switched her to [facility] but cost remained too expensive. She reports migraines are stable. We did not have samples, today. I advised she call insurance agents to discuss possible supplement plans that may help with coverage versus applying for patient assistance.   11/25/2021 ALL: [name] returns for Botox. She is doing well. Migraines seem to return about 2 weeks before Botox is due but she is able to abort with aspirin. She continues Ajovy every month.   09/02/2021 ALL: [name] returns for Botox. She continues Ajovy every 30 days. She reports headaches have been really bad over the past three weeks. She was seen in the ER yesterday for shob, abdominal pain, and decreased appetitive for the past 3 weeks. CT showed large hiatal hernia. Vitals and EKG normal. She is scheduled to see GI tomorrow. She request to continue with Botox procedure today to help settle down worsening headaches. No acute distress.   Medications tried and failed: Amovig (ineffective and weight gain) 08/2018 switched to Wild Rose 07/2019 (works well), Cymbalta, Topamax (side effects), Depakote. Abortive: Rizatriptan (works well), Nurtec (works well), Imitrex (ineffective), Ubrelvy (ineffective), Zofran, Zyprexa.   06/09/2021 ALL: [name] returns for Botox. She continues to do well. She continues Ajovy samples provided by Dr Jaynee Eagles. She reports that she has not had any migrainous headaches  since last being seen. Discussed Botox protocol.   03/05/2021 ALL: She returns for Botox. Rizatriptan helps with abortive needs. She has not had a headache since last procedure. She requests masseter and orb oculi injections as they help with clenching and retro orbital eye pain.   11/27/2020 AA: Stable: botox has "changed my life". +5 each masseter, +5 each orb oculi.  08/27/2020 AA:  +a. Needs dry needling 90% decrease in migraine frequency and severity since starting botox she may get one migraine in the last 1-2 weeks as it is wearinf off. She clenches. She feels the botox wears off near the end..Ask her about Ajovy* in August 2022 if she would like to reconsider but she is doing fantastic on botox.   Consent Form Botulism Toxin Injection For Chronic Migraine   Reviewed orally with patient, additionally signature is on file:  Botulism toxin has been approved by the Federal drug administration for treatment of chronic migraine. Botulism toxin does not cure chronic migraine and it may not be effective in some patients.  The administration of botulism toxin is accomplished by injecting a small amount of toxin into the muscles of the neck and head. Dosage must be titrated for each individual. Any benefits resulting from botulism toxin tend to wear off after 3 months with a repeat injection required if benefit is to be maintained. Injections are usually done every 3-4 months with maximum effect peak achieved by about 2 or 3 weeks. Botulism toxin is expensive and you should be sure of what costs you will incur resulting from the injection.  The side effects of botulism toxin  use for chronic migraine may include:   -Transient, and usually mild, facial weakness with facial injections  -Transient, and usually mild, head or neck weakness with head/neck injections  -Reduction or loss of forehead facial animation due to forehead muscle weakness  -Eyelid drooping  -Dry eye  -Pain at the site of  injection or bruising at the site of injection  -Double vision  -Potential unknown long term risks   Contraindications: You should not have Botox if you are pregnant, nursing, allergic to albumin, have an infection, skin condition, or muscle weakness at the site of the injection, or have myasthenia gravis, Lambert-Eaton syndrome, or ALS.  It is also possible that as with any injection, there may be an allergic reaction or no effect from the medication. Reduced effectiveness after repeated injections is sometimes seen and rarely infection at the injection site may occur. All care will be taken to prevent these side effects. If therapy is given over a long time, atrophy and wasting in the muscle injected may occur. Occasionally the patient's become refractory to treatment because they develop antibodies to the toxin. In this event, therapy needs to be modified.  I have read the above information and consent to the administration of botulism toxin.    BOTOX PROCEDURE NOTE FOR MIGRAINE HEADACHE  Contraindications and precautions discussed with patient(above). Aseptic procedure was observed and patient tolerated procedure. Procedure performed by Debbora Presto, FNP-C.   The condition has existed for more than 6 months, and pt does not have a diagnosis of ALS, Myasthenia Gravis or Lambert-Eaton Syndrome.  Risks and benefits of injections discussed and pt agrees to proceed with the procedure.  Written consent obtained  These injections are medically necessary. Pt  receives good benefits from these injections. These injections do not cause sedations or hallucinations which the oral therapies may cause.   Description of procedure:  The patient was placed in a sitting position. The standard protocol was used for Botox as follows, with 5 units of Botox injected at each site:  -Procerus muscle, midline injection  -Corrugator muscle, bilateral injection  -Frontalis muscle, bilateral injection, with 2  sites each side, medial injection was performed in the upper one third of the frontalis muscle, in the region vertical from the medial inferior edge of the superior orbital rim. The lateral injection was again in the upper one third of the forehead vertically above the lateral limbus of the cornea, 1.5 cm lateral to the medial injection site.  -Temporalis muscle injection, 4 sites, bilaterally. The first injection was 3 cm above the tragus of the ear, second injection site was 1.5 cm to 3 cm up from the first injection site in line with the tragus of the ear. The third injection site was 1.5-3 cm forward between the first 2 injection sites. The fourth injection site was 1.5 cm posterior to the second injection site. 5th site laterally in the temporalis  muscleat the level of the outer canthus.  -Occipitalis muscle injection, 3 sites, bilaterally. The first injection was done one half way between the occipital protuberance and the tip of the mastoid process behind the ear. The second injection site was done lateral and superior to the first, 1 fingerbreadth from the first injection. The third injection site was 1 fingerbreadth superiorly and medially from the first injection site.  -Cervical paraspinal muscle injection, 2 sites, bilaterally. The first injection site was 1 cm from the midline of the cervical spine, 3 cm inferior to the lower border of the  occipital protuberance. The second injection site was 1.5 cm superiorly and laterally to the first injection site.  -Trapezius muscle injection was performed at 3 sites, bilaterally. The first injection site was in the upper trapezius muscle halfway between the inflection point of the neck, and the acromion. The second injection site was one half way between the acromion and the first injection site. The third injection was done between the first injection site and the inflection point of the neck.    Will return for repeat injection in 3 months.   A  total of 200 units of Botox was prepared, 155 units of Botox was injected as documented above, any Botox not injected was wasted. The patient tolerated the procedure well, there were no complications of the above procedure.

## 2022-05-15 ENCOUNTER — Telehealth: Payer: Self-pay

## 2022-05-15 NOTE — Telephone Encounter (Signed)
Mansouraty, Telford Nab., MD  Timothy Lasso, RN Theoren Palka, This was in Dan's message box. Please schedule this patient with an APP. Whomever is supervising MD that day will take over this patient's care in DJ's absence. GM

## 2022-05-15 NOTE — Telephone Encounter (Signed)
Per [facility] pt needs appt for gastroparesis follow up  Appt made with Ellouise Newer on 06/02/22 at 11 am  Letter mailed to the pt with appt information

## 2022-05-16 ENCOUNTER — Other Ambulatory Visit: Payer: Self-pay | Admitting: Primary Care

## 2022-05-16 DIAGNOSIS — G4700 Insomnia, unspecified: Secondary | ICD-10-CM

## 2022-05-19 ENCOUNTER — Ambulatory Visit: Payer: PPO | Admitting: Family Medicine

## 2022-05-19 DIAGNOSIS — G43709 Chronic migraine without aura, not intractable, without status migrainosus: Secondary | ICD-10-CM

## 2022-05-19 NOTE — Progress Notes (Signed)
Botox- 200 units x 1 vial Lot: [id number] Expiration: 10/2024 NDC: 8004-4715-80  Bacteriostatic 0.9% Sodium Chloride- 63m total Lot: [id number] Expiration: 11/25 NDC: [address]-59 Dx: GR33.125B/B

## 2022-05-20 MED ORDER — ONABOTULINUMTOXINA 200 UNITS IJ SOLR
155.0000 [IU] | Freq: Once | INTRAMUSCULAR | Status: AC
  Administered 2022-05-20: 155 [IU] via INTRAMUSCULAR

## 2022-05-25 ENCOUNTER — Other Ambulatory Visit: Payer: Self-pay | Admitting: Primary Care

## 2022-05-25 DIAGNOSIS — M810 Age-related osteoporosis without current pathological fracture: Secondary | ICD-10-CM

## 2022-06-02 ENCOUNTER — Other Ambulatory Visit (INDEPENDENT_AMBULATORY_CARE_PROVIDER_SITE_OTHER): Payer: PPO

## 2022-06-02 ENCOUNTER — Other Ambulatory Visit: Payer: Self-pay | Admitting: Adult Health

## 2022-06-02 ENCOUNTER — Encounter: Payer: Self-pay | Admitting: Physician Assistant

## 2022-06-02 ENCOUNTER — Telehealth: Payer: Self-pay

## 2022-06-02 ENCOUNTER — Ambulatory Visit: Payer: PPO | Admitting: Physician Assistant

## 2022-06-02 VITALS — BP 88/62 | HR 107 | Ht 60.0 in | Wt 117.0 lb

## 2022-06-02 DIAGNOSIS — K3184 Gastroparesis: Secondary | ICD-10-CM

## 2022-06-02 DIAGNOSIS — R5383 Other fatigue: Secondary | ICD-10-CM

## 2022-06-02 DIAGNOSIS — R634 Abnormal weight loss: Secondary | ICD-10-CM

## 2022-06-02 DIAGNOSIS — R1013 Epigastric pain: Secondary | ICD-10-CM | POA: Diagnosis not present

## 2022-06-02 DIAGNOSIS — F411 Generalized anxiety disorder: Secondary | ICD-10-CM

## 2022-06-02 LAB — COMPREHENSIVE METABOLIC PANEL
ALT: 11 U/L (ref 0–35)
AST: 19 U/L (ref 0–37)
Albumin: 4.4 g/dL (ref 3.5–5.2)
Alkaline Phosphatase: 57 U/L (ref 39–117)
BUN: 24 mg/dL — ABNORMAL HIGH (ref 6–23)
CO2: 27 mEq/L (ref 19–32)
Calcium: 11.3 mg/dL — ABNORMAL HIGH (ref 8.4–10.5)
Chloride: 96 mEq/L (ref 96–112)
Creatinine, Ser: 0.91 mg/dL (ref 0.40–1.20)
GFR: 62.95 mL/min (ref >60.00)
Glucose, Bld: 98 mg/dL (ref 70–99)
Potassium: 3.7 mEq/L (ref 3.5–5.1)
Sodium: 136 mEq/L (ref 135–145)
Total Bilirubin: 0.4 mg/dL (ref 0.2–1.2)
Total Protein: 7.3 g/dL (ref 6.0–8.3)

## 2022-06-02 LAB — CBC WITH DIFFERENTIAL/PLATELET
Basophils Absolute: 0.1 10*3/uL (ref 0.0–0.1)
Basophils Relative: 0.6 % (ref 0.0–3.0)
Eosinophils Absolute: 0.2 10*3/uL (ref 0.0–0.7)
Eosinophils Relative: 2.1 % (ref 0.0–5.0)
HCT: 37.7 % (ref 36.0–46.0)
Hemoglobin: 13 g/dL (ref 12.0–15.0)
Lymphocytes Relative: 30.4 % (ref 12.0–46.0)
Lymphs Abs: 2.8 10*3/uL (ref 0.7–4.0)
MCHC: 34.3 g/dL (ref 30.0–36.0)
MCV: 90.7 fl (ref 78.0–100.0)
Monocytes Absolute: 0.7 10*3/uL (ref 0.1–1.0)
Monocytes Relative: 7.3 % (ref 3.0–12.0)
Neutro Abs: 5.6 10*3/uL (ref 1.4–7.7)
Neutrophils Relative %: 59.6 % (ref 43.0–77.0)
Platelets: 294 10*3/uL (ref 150.0–400.0)
RBC: 4.16 Mil/uL (ref 3.87–5.11)
RDW: 13.2 % (ref 11.5–15.5)
WBC: 9.4 10*3/uL (ref 4.0–10.5)

## 2022-06-02 LAB — IBC + FERRITIN
Ferritin: 48.3 ng/mL (ref 10.0–291.0)
Iron: 74 ug/dL (ref 42–145)
Saturation Ratios: 19.9 % — ABNORMAL LOW (ref 20.0–50.0)
TIBC: 371 ug/dL (ref 250.0–450.0)
Transferrin: 265 mg/dL (ref 212.0–360.0)

## 2022-06-02 LAB — LIPASE: Lipase: 30 U/L (ref 11.0–59.0)

## 2022-06-02 LAB — FOLATE: Folate: >23.8 ng/mL (ref >5.9)

## 2022-06-02 LAB — VITAMIN D 25 HYDROXY (VIT D DEFICIENCY, FRACTURES): VITD: 48.3 ng/mL (ref 30.00–100.00)

## 2022-06-02 LAB — VITAMIN B12: Vitamin B-12: 1192 pg/mL — ABNORMAL HIGH (ref 211–911)

## 2022-06-02 MED ORDER — PANTOPRAZOLE SODIUM 40 MG PO TBEC: 40.0000 mg | DELAYED_RELEASE_TABLET | Freq: Two times a day (BID) | ORAL | 3 refills | Status: DC

## 2022-06-02 MED ORDER — AMBULATORY NON FORMULARY MEDICATION: 1 refills | Status: DC

## 2022-06-02 NOTE — Telephone Encounter (Signed)
Lm on vm for patient to return call to discuss additional recommendations and sooner appt.

## 2022-06-02 NOTE — Telephone Encounter (Signed)
-----  Message from Yetta Flock, MD sent at 06/02/2022  1:03 PM EST ----- Got it.  Lasundra Hascall can you advise the patient on the following: - she is currently scheduled to have her EGD done Friday. I have an opening Thursday if she wants to do it sooner - she should try being on a liquid diet until her procedure in case she has some partial gastric outlet obstruction or gastroparesis, she may feel better with this. I would like her on a liquid diet at least 24 hours prior to the exam to allow her stomach to clear as much as possible for this exam.  - if she is getting dehydrated or not eating well may need to go to the ED for IVF in the interim  ----- Message ----- From: Levin Erp, PA Sent: 06/02/2022  12:09 PM EST To: Yetta Flock, MD  Previously Dr. Ardis Hughs patient being assigned to you now due to your availability in the [facility].

## 2022-06-02 NOTE — Progress Notes (Signed)
Chief Complaint: Gastroparesis and epigastric pain  HPI:    [name] is a 72 year old female, previously known to Dr. Ardis Hughs, with a past medical history as listed below including GERD, osteoporosis, prolonged QT interval and gastroparesis, who was referred to me by Pleas Koch, NP for a complaint of gastroparesis and epigastric pain.      03/07/2021 colonoscopy for history of colon polyps and previous sigmoid resection and rectopexy in 2018 with two 4-8 mm polyps in the transverse colon and in the ascending colon.  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  Repeat recommended in 3 years.    11/20/2021 office visit with general surgery at [facility] that time started on Protonix and Carafate.  Discussed that she had previously had a semiurgent repair of incarcerated paraesophageal hernia with gastropexy/G-tube and JP drain on 09/15/2021 with Dr. Windle Guard.  10/30/2021 she had removal of her G-tube and was prescribed Reglan to see if it improve gastric emptying to help with nighttime regurgitation.  At that time still having trouble with vomiting.  Also reflux.    01/21/2022 upper GI study with retained food in the stomach, gastric fold thickening and prominent gastro mucosal irregularity with superimposed regions of apparent ulceration and possible polyps, findings likely reflect severe gastritis, recommend EGD, small recurrent hiatal hernia and redemonstrated small focal outpouching at the level of the GE junction likely reflecting a diverticulum and large volume spontaneous GERD to the level of the upper esophagus.    01/28/2022 gastric emptying study with delayed gastric emptying.    04/07/2022 hemoglobin A1c 5.8.    Today, patient presents to clinic accompanied by her husband and discusses that really since time of her paraesophageal hernia repair in May she has not felt well.  She had an evaluation with studies as above and was eventually started on Carafate which seemed to help a little  bit with the discomfort but she continues with symptoms of a decreased appetite and generalized abdominal pain/epigastric pain.  She then ran out of that after about 3 months.  Tells me that she was on also Reglan for a while but did not feel like it ever helped.  She recently started this back 10 mg 4 times daily over the past couple of weeks but still feels the same.  Tells me she never has an appetite and when she eats it is only a tiny amount of food and makes her feel very uncomfortable and makes her which she had not needed at all.  Describes a 7-8/10 pain.  No real nausea or vomiting but her husband describes some dry heaves occasionally.  Apparently in the last time she tried to eat was 2 days ago and had 2 bites of eggs in the morning and has not eaten since.  Also feels like liquids make this worse.  She is 5 pounds down since her last visit in a clinic which was just a couple of weeks ago.  Also describes major dental work being done about a week ago which she feels like made everything worse.  Also chronic constipation better when she uses stool softeners.    Denies fever, chills or blood in her stool.  Past Medical History:  Diagnosis Date   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   Anxiety    Asthma    as a child   CAP (community acquired pneumonia)    Cataract 2018   removed both eyes   COLONIC POLYPS, HX OF 11/15/2009   DEPRESSION 11/14/2009   GASTROJEJ  ULCR UNS ACUT/CHRN W/O HEMOR PERF/OBST 11/06/2009   GERD (gastroesophageal reflux disease)    not on meds   Headache(784.0) 11/15/2009   Heart murmur    never bothered pt. always been told this   HEART MURMUR, HX OF 11/15/2009   HIATAL HERNIA 10/08/2009   Large   HIP FRACTURE, LEFT 11/14/2009   History of hiatal hernia    HYPERLIPIDEMIA 11/14/2009   HYPERTENSION 11/14/2009   OSTEOPOROSIS 11/14/2009   Prolonged Q-T interval on ECG 09/02/2021   WEIGHT GAIN 01/10/2010    Past Surgical History:  Procedure Laterality Date    APPENDECTOMY     AUGMENTATION MAMMAPLASTY Bilateral    BACK SURGERY  2003   Bleeding ulcers  10/2009   Broken Hip Left 06/2001   CATARACT EXTRACTION Bilateral 01/2017   COLONOSCOPY N/A 12/30/2016   Procedure: COLONOSCOPY WITH PROPOFOL;  Surgeon: Leighton Ruff, MD;  Location: [facility];  Service: Endoscopy;  Laterality: N/A;   COLONOSCOPY  2011   inadequate prep, polyps   GASTROSTOMY N/A 09/15/2021   Procedure: INSERTION OF GASTROSTOMY TUBE;  Surgeon: Clovis Riley, MD;  Location: [facility];  Service: General;  Laterality: N/A;   NECK SURGERY  2004 & 2006   x's 2   POLYPECTOMY     RECTAL PROLAPSE REPAIR  01/2017   XI ROBOTIC ASSISTED PARAESOPHAGEAL HERNIA REPAIR N/A 09/15/2021   Procedure: XI ROBOTIC ASSISTED PARAESOPHAGEAL HERNIA REPAIR WITH GASTROPEXY;  Surgeon: Clovis Riley, MD;  Location: [facility];  Service: General;  Laterality: N/A;    Current Outpatient Medications  Medication Sig Dispense Refill   albuterol (VENTOLIN HFA) 108 (90 Base) MCG/ACT inhaler Inhale 1-2 puffs into the lungs every 6 (six) hours as needed for wheezing or shortness of breath. 6.7 g 0   amitriptyline (ELAVIL) 25 MG tablet Take 1 tablet (25 mg total) by mouth at bedtime. For sleep 90 tablet 0   CALCIUM PO Take 1 tablet by mouth daily.      ibandronate (BONIVA) 150 MG tablet TAKE 1 TABLET BY MOUTH EVERY 30 DAYS FOR BONE DENSITY. 3 tablet 2   lisinopril (ZESTRIL) 20 MG tablet TAKE 1 TABLET BY MOUTH EVERY DAY FOR BLOOD PRESSURE 90 tablet 3   metoCLOPramide (REGLAN) 10 MG tablet Take 10 mg by mouth 4 (four) times daily.     Multiple Vitamins-Minerals (MULTIVITAMIN PO) Take 1 tablet by mouth daily.      rosuvastatin (CRESTOR) 5 MG tablet TAKE 1 TABLET BY MOUTH EVERY DAY FOR CHOLESTEROL 90 tablet 0   sertraline (ZOLOFT) 100 MG tablet Take 1/2 tablet daily x 7 days, then increase to '100mg'$  daily. 30 tablet 2   No current facility-administered medications for this visit.    Allergies as of 06/02/2022   (No  Known Allergies)    Family History  Problem Relation Age of Onset   Bone cancer Mother    Hypertension Other    Allergic rhinitis Neg Hx    Asthma Neg Hx    Urticaria Neg Hx    Colon cancer Neg Hx    Colon polyps Neg Hx    Esophageal cancer Neg Hx    Rectal cancer Neg Hx    Stomach cancer Neg Hx    Breast cancer Neg Hx     Social History   Socioeconomic History   Marital status: Married    Spouse name: [name]   Number of children: 3   Years of education: BA   Highest education level: Not on file  Occupational  History   Occupation: IT consultant and Spa  Tobacco Use   Smoking status: Former    Years: 15.00    Types: Cigarettes    Quit date: 05/04/1982    Years since quitting: 40.1   Smokeless tobacco: Never   Tobacco comments:    Married, 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[employer], 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 (stress)  Vaping Use   Vaping Use: Never used  Substance and Sexual Activity   Alcohol use: Yes    Alcohol/week: 1.0 standard drink of alcohol    Types: 1 Glasses of wine per week    Comment: nightly   Drug use: No   Sexual activity: Not Currently    Birth control/protection: None  Other Topics Concern   Not on file  Social History Narrative   Lives with husband   Caffeine use: 1 cup or less    Right handed   Social Determinants of Health   Financial Resource Strain: Low Risk  (10/28/2021)   Overall Financial Resource Strain (CARDIA)    Difficulty of Paying Living Expenses: Not hard at all  Food Insecurity: No Food Insecurity (10/28/2021)   Hunger Vital Sign    Worried About Running Out of Food in the Last Year: Never true    Ran Out of Food in the Last Year: Never true  Transportation Needs: No Transportation Needs (10/28/2021)   PRAPARE - Hydrologist (Medical): No    Lack of Transportation (Non-Medical): No  Physical Activity: Inactive (10/28/2021)   Exercise Vital Sign    Days of  Exercise per Week: 0 days    Minutes of Exercise per Session: 0 min  Stress: No Stress Concern Present (10/28/2021)   [place]    Feeling of Stress : Not at all  Social Connections: Moderately Isolated (10/28/2021)   Social Connection and Isolation Panel [NHANES]    Frequency of Communication with Friends and Family: More than three times a week    Frequency of Social Gatherings with Friends and Family: More than three times a week    Attends Religious Services: Never    Marine scientist or Organizations: No    Attends Archivist Meetings: Never    Marital Status: Married  Human resources officer Violence: Not At Risk (10/28/2021)   Humiliation, Afraid, Rape, and Kick questionnaire    Fear of Current or Ex-Partner: No    Emotionally Abused: No    Physically Abused: No    Sexually Abused: No    Review of Systems:    Constitutional: No fever or chills Skin: No rash  Cardiovascular: No chest pain Respiratory: No SOB  Gastrointestinal: See HPI and otherwise negative Genitourinary: No dysuria Neurological: No headache, dizziness or syncope Musculoskeletal: No new muscle or joint pain Hematologic: No bleeding  Psychiatric: +depression   Physical Exam:  Vital signs: BP (!) 88/62   Pulse (!) 107   Ht 5' (1.524 m)   Wt 117 lb (53.1 kg)   SpO2 98%   BMI 22.85 kg/m    Constitutional:   Pleasant acutely ill appearing Caucasian female appears to be in NAD, Well developed, Well nourished, alert and cooperative Head:  Normocephalic and atraumatic. Eyes:   PEERL, EOMI. No icterus. Conjunctiva pink. Ears:  Normal auditory acuity. Neck:  Supple Throat: Oral cavity and pharynx without inflammation, swelling or lesion.  Respiratory: Respirations even and unlabored. Lungs clear  to auscultation bilaterally.   No wheezes, crackles, or rhonchi.  Cardiovascular: Normal S1, S2. No MRG. Regular rate and rhythm. No  peripheral edema, cyanosis or pallor.  Gastrointestinal:  Soft, nondistended, moderate epigastric ttp, No rebound or guarding. Normal bowel sounds. No appreciable masses or hepatomegaly. Rectal:  Not performed.  Msk:  Symmetrical without gross deformities. Without edema, no deformity or joint abnormality.  Neurologic:  Alert and  oriented x4;  grossly normal neurologically.  Skin:   Dry and intact without significant lesions or rashes. Psychiatric: Demonstrates good judgement and reason without abnormal affect or behaviors.  RELEVANT LABS AND IMAGING: CBC    Component Value Date/Time   WBC 9.5 09/16/2021 0438   RBC 3.44 (L) 09/16/2021 0438   HGB 11.1 (L) 09/16/2021 0438   HCT 34.0 (L) 09/16/2021 0438   PLT 165 09/16/2021 0438   MCV 98.8 09/16/2021 0438   MCH 32.3 09/16/2021 0438   MCHC 32.6 09/16/2021 0438   RDW 12.7 09/16/2021 0438   LYMPHSABS 1.6 09/01/2021 1150   MONOABS 0.6 09/01/2021 1150   EOSABS 0.1 09/01/2021 1150   BASOSABS 0.0 09/01/2021 1150    CMP     Component Value Date/Time   NA 139 09/16/2021 0438   K 4.0 09/16/2021 0438   CL 104 09/16/2021 0438   CO2 29 09/16/2021 0438   GLUCOSE 100 (H) 09/16/2021 0438   BUN 11 09/16/2021 0438   CREATININE 0.77 09/16/2021 0438   CREATININE 1.01 (H) 02/25/2018 1448   CALCIUM 8.4 (L) 09/16/2021 0438   PROT 6.4 (L) 09/01/2021 1150   ALBUMIN 3.8 09/01/2021 1150   AST 25 09/01/2021 1150   ALT 24 09/01/2021 1150   ALKPHOS 63 09/01/2021 1150   BILITOT 0.7 09/01/2021 1150   GFRNONAA >60 09/16/2021 0438   GFRAA >60 01/02/2017 0515    Assessment: 1.  Epigastric pain: Since May of last year when she had an emergent paraesophageal hernia repair, imaging since then showing severe gastritis with question of PUD and gastric emptying study abnormal, tried on Carafate and Reglan with no real improvement, prescribed Pantoprazole but does not recall this and has not taken; concern for ongoing gastritis +/- PUD +/- gastroparesis 2.   Early satiety: With above 3.  Weight loss: 5 pounds in just the past 1 to 2 weeks 4.  Gastroparesis: Gastric emptying study abnormal in September, though patient's A1c has been normal, question if this was med induced or other  Plan: 1.  Scheduled patient for an emergent EGD in the [place] with Dr. Havery Moros due to availability.  Did provide the patient a detailed list of risks for the procedure and she agrees to proceed. Patient is appropriate for endoscopic procedure(s) in the ambulatory ([place]) setting.  2.  Did discuss patient's hypotension today.  She needs to be keeping an eye on this at home, she may not need to be taking her lisinopril if she is not eating or drinking.  Told her to call her PCP and update them so that they can modify as needed.  Did discuss that if she is hypotensive at time of procedure they will not put her to sleep. 3.  Discussed gastroparesis, unsure if this is a chronic diagnosis of her, looks like her hemoglobin A1c has been under control for some time now.  It could have been related to recent surgery versus Zofran?  Unsure if she was on this or not 4.  For now continue Reglan 10 mg 4 times daily, 20-30 minutes before meals  and at bedtime 5.  Started Pantoprazole 40 mg twice daily,  30-60 minutes before breakfast and dinner.  #60 with 5 refills.  Discussed how important this medication is.  Apparently she does not recall being prescribed in the past and is certainly not taking it now. 6.  Reviewed upper GI study with signs of gastritis and possible ulcers. 7.  Also discussed with the patient and her husband that if she continues to dwindle over the next day or so and cannot eat or drink she needs to go to the ER.  We can perform this procedure for her there after she has been resuscitated with fluids and electrolytes. 8. Also added labs today including CBC, CMP, lipase, iron studies, B12/folate and vitamin D 9.  Patient to follow in clinic per recommendations after time of  procedure.  Ellouise Newer, PA-C [facility] 06/02/2022, 11:16 AM  Cc: Pleas Koch, NP

## 2022-06-02 NOTE — Telephone Encounter (Signed)
Returned call to patient. We moved up her EGD to Thursday, 06/04/22 at 8:30 am. Pt knows to arrive by 7:30 am with a care partner. Pt has been advised to try to maintain a clear liquid diet 24 hours prior to her exam. She has been advised to avoid anything red or purple. Pt has been advised to go to ED if she becomes dehydrated or if she is not eating well. Pt verbalized understanding and had no concerns at the end of the call.

## 2022-06-02 NOTE — Telephone Encounter (Signed)
Patient returned call  Please advise 

## 2022-06-02 NOTE — Patient Instructions (Addendum)
Your provider has requested that you go to the basement level for lab work before leaving today. Press "B" on the elevator. The lab is located at the first door on the left as you exit the elevator.   You have been scheduled for an endoscopy. Please follow written instructions given to you at your visit today. If you use inhalers (even only as needed), please bring them with you on the day of your procedure.   We have sent the following medications to your pharmacy for you to pick up at your convenience:   Start GI cocktail 5-10 ml every 4-6 hours as needed for pain Pantoprazole 40 mg  take 1 tablet by mouth 2 times daily  Continue Reglan 10 mg 4 times daily as directed  If your blood pressure at your visit was 140/90 or greater, please contact your primary care physician to follow up on this.  _______________________________________________________  If you are age 73 or older, your body mass index should be between 23-30. Your Body mass index is 22.85 kg/m. If this is out of the aforementioned range listed, please consider follow up with your Primary Care Provider.  If you are age 73 or younger, your body mass index should be between 19-25. Your Body mass index is 22.85 kg/m. If this is out of the aformentioned range listed, please consider follow up with your Primary Care Provider.   ________________________________________________________  The [place] [facility] providers would like to encourage you to use [facility] to communicate with providers for non-urgent requests or questions.  Due to long hold times on the telephone, sending your provider a message by [facility] may be a faster and more efficient way to get a response.  Please allow 48 business hours for a response.  Please remember that this is for non-urgent requests.   Due to recent changes in healthcare laws, you may see the results of your imaging and laboratory studies on MyChart before your provider has had a chance to review them.  We  understand that in some cases there may be results that are confusing or concerning to you. Not all laboratory results come back in the same time frame and the provider may be waiting for multiple results in order to interpret others.  Please give [place] 48 hours in order for your provider to thoroughly review all the results before contacting the office for clarification of your results.    Thank you for entrusting me with your care and choosing [facility].  Ellouise Newer PA-C

## 2022-06-02 NOTE — Progress Notes (Signed)
Agree with assessment and plan as outlined. Needs EGD to rule out gastric outlet obstruction, rule out PUD, etc given prior imaging studies. Agree she should be on PPI and would resume that.  She may feel better on a liquid diet and would recommend she do that the day before the exam to make sure there is no retained food in her stomach the day of her procedure. If she dehydrated from poor PO intake the the interim may need IV fluids in the ED, etc. Agree she should hold her lisinopril and contact PCP about her HTN regimen for now while she is not eating well.   I have an opening to do her case on Thursday if she would prefer to have it done sooner than Friday.

## 2022-06-04 ENCOUNTER — Ambulatory Visit (AMBULATORY_SURGERY_CENTER): Payer: PPO | Admitting: Gastroenterology

## 2022-06-04 ENCOUNTER — Encounter: Payer: PPO | Admitting: Gastroenterology

## 2022-06-04 ENCOUNTER — Encounter: Payer: Self-pay | Admitting: Gastroenterology

## 2022-06-04 VITALS — BP 141/73 | HR 80 | Temp 97.5°F | Resp 12 | Ht 60.0 in | Wt 117.0 lb

## 2022-06-04 DIAGNOSIS — K2210 Ulcer of esophagus without bleeding: Secondary | ICD-10-CM | POA: Diagnosis not present

## 2022-06-04 DIAGNOSIS — R634 Abnormal weight loss: Secondary | ICD-10-CM

## 2022-06-04 DIAGNOSIS — R1013 Epigastric pain: Secondary | ICD-10-CM

## 2022-06-04 DIAGNOSIS — K3184 Gastroparesis: Secondary | ICD-10-CM

## 2022-06-04 MED ORDER — SODIUM CHLORIDE 0.9 % IV SOLN: 500.0000 mL | INTRAVENOUS | Status: DC

## 2022-06-04 MED ORDER — SUCRALFATE 1 GM/10ML PO SUSP: 1.0000 g | Freq: Four times a day (QID) | ORAL | 3 refills | Status: DC

## 2022-06-04 NOTE — Progress Notes (Signed)
Pt's states no medical or surgical changes since previsit or office visit. 

## 2022-06-04 NOTE — Patient Instructions (Signed)
Stay on a liquid diet for a few days (She tolerates this better) Continue current medications Please make sure you take the Protonix 40 mg twice daily  Take liquid Carafate 10 cc every 6 hours Continue Reglan Awaiting pathology results.  YOU HAD AN ENDOSCOPIC PROCEDURE TODAY AT [facility]:   Refer to the procedure report that was given to you for any specific questions about what was found during the examination.  If the procedure report does not answer your questions, please call your gastroenterologist to clarify.  If you requested that your care partner not be given the details of your procedure findings, then the procedure report has been included in a sealed envelope for you to review at your convenience later.  YOU SHOULD EXPECT: Some feelings of bloating in the abdomen. Passage of more gas than usual.  Walking can help get rid of the air that was put into your GI tract during the procedure and reduce the bloating. If you had a lower endoscopy (such as a colonoscopy or flexible sigmoidoscopy) you may notice spotting of blood in your stool or on the toilet paper. If you underwent a bowel prep for your procedure, you may not have a normal bowel movement for a few days.  Please Note:  You might notice some irritation and congestion in your nose or some drainage.  This is from the oxygen used during your procedure.  There is no need for concern and it should clear up in a day or so.  SYMPTOMS TO REPORT IMMEDIATELY:  Following upper endoscopy (EGD)  Vomiting of blood or coffee ground material  New chest pain or pain under the shoulder blades  Painful or persistently difficult swallowing  New shortness of breath  Fever of 100F or higher  Black, tarry-looking stools  For urgent or emergent issues, a gastroenterologist can be reached at any hour by calling [phone]. Do not use MyChart messaging for urgent concerns.    DIET:  We do recommend a small meal at first, but  then you may proceed to your regular diet.  Drink plenty of fluids but you should avoid alcoholic beverages for 24 hours.  ACTIVITY:  You should plan to take it easy for the rest of today and you should NOT DRIVE or use heavy machinery until tomorrow (because of the sedation medicines used during the test).    FOLLOW UP: Our staff will call the number listed on your records the next business day following your procedure.  We will call around 7:15- 8:00 am to check on you and address any questions or concerns that you may have regarding the information given to you following your procedure. If we do not reach you, we will leave a message.     If any biopsies were taken you will be contacted by phone or by letter within the next 1-3 weeks.  Please call us at [phone] if you have not heard about the biopsies in 3 weeks.    SIGNATURES/CONFIDENTIALITY: You and/or your care partner have signed paperwork which will be entered into your electronic medical record.  These signatures attest to the fact that that the information above on your After Visit Summary has been reviewed and is understood.  Full responsibility of the confidentiality of this discharge information lies with you and/or your care-partner.

## 2022-06-04 NOTE — Progress Notes (Signed)
History and Physical Interval Note: PAtient seen 1/30 no interval changes. Abnormal GES and upper GI series showing delayed gastric emptying, history of emergent paraesophageal hernia repair in May. On Reglan , carafate recently, protonix just added a few days ago. EGD to further evaluate She denies any cardiopulmonary symptoms today. 06/04/2022 9:36 AM  [name]  has presented today for endoscopic procedure(s), with the diagnosis of  Encounter Diagnoses  Name Primary?   Epigastric pain Yes   Gastroparesis    Loss of weight   .  The various methods of evaluation and treatment have been discussed with the patient and/or family. After consideration of risks, benefits and other options for treatment, the patient has consented to  the endoscopic procedure(s).   The patient's history has been reviewed, patient examined, no change in status, stable for surgery.  I have reviewed the patient's chart and labs.  Questions were answered to the patient's satisfaction.    Jolly Mango, MD [facility]

## 2022-06-04 NOTE — Op Note (Signed)
[name]: [name] Procedure Date: 06/04/2022 9:37 AM MRN: [id number] Endoscopist: Remo Lipps P. Havery Moros , MD, [id number] Age: 72 Referring MD:  Date of Birth: [birth year] Gender: Female Account #: [id number] Procedure:                Upper GI endoscopy Indications:              Epigastric abdominal pain, Suspected gastroparesis                            - history of paraesophageal hernia repair May 2023,                            has not been eating well since then. On Reglan and                            Carafate (apparently not yet PPI), and symptoms                            persist Medicines:                Monitored Anesthesia Care Procedure:                Pre-Anesthesia Assessment:                           - Prior to the procedure, a History and Physical                            was performed, and patient medications and                            allergies were reviewed. The patient's tolerance of                            previous anesthesia was also reviewed. The risks                            and benefits of the procedure and the sedation                            options and risks were discussed with the patient.                            All questions were answered, and informed consent                            was obtained. Prior Anticoagulants: The patient has                            taken no anticoagulant or antiplatelet agents. ASA                            Grade Assessment: II - A patient with mild systemic  disease. After reviewing the risks and benefits,                            the patient was deemed in satisfactory condition to                            undergo the procedure.                           After obtaining informed consent, the endoscope was                            passed under direct vision. Throughout the                            procedure, the patient's blood  pressure, pulse, and                            oxygen saturations were monitored continuously. The                            GIF [id number] #[id number] was introduced through the                            mouth, and advanced to the second part of duodenum.                            The upper GI endoscopy was accomplished without                            difficulty. The patient tolerated the procedure                            well. Scope In: Scope Out: Findings:                 Esophagogastric landmarks were identified: the                            Z-line was found at 35 cm, the gastroesophageal                            junction was found at 35 cm and the upper extent of                            the gastric folds was found at 38 cm from the                            incisors.                           A 3 cm hiatal hernia was present.                           Severe LA Grade D esophagitis was  found 29 to 35 cm                            from the incisors. Biopsies were taken with a cold                            forceps for histology to ensure no viral etiology.                           The exam of the esophagus was otherwise normal.                           Food (residue) was found in the gastric antrum -                            most suctioned, but could not clear antrum of                            pathology given this finding.                           The exam of the stomach was otherwise normal.                            Pylorus was widely patent - no evidence of gastric                            outlet obstruction                           The examined duodenum was normal. Complications:            No immediate complications. Estimated blood loss:                            Minimal. Estimated Blood Loss:     Estimated blood loss was minimal. Impression:               - Esophagogastric landmarks identified.                           - 3 cm hiatal hernia.                            - Severe LA Grade D reflux esophagitis. Biopsied.                           - Food (residue) in the stomach - could not                            entirely clear distal stomach                           - No evidence of gastric outlet obstruction                           -  Normal examined duodenum.                           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                            Unfortunately not doing well with Reglan Recommendation:           - Patient has a contact number available for                            emergencies. The signs and symptoms of potential                            delayed complications were discussed with the                            patient. Return to normal activities tomorrow.                            Written discharge instructions were provided to the                            patient.                           - Stay on liquid diet for a few days (she tolerates                            this better)                           - Continue present medications.                           - Please make sure you take protonix '40mg'$  twice                            daily (started yesterday)                           - Liquid carafate 10cc every 6 hours                           - Continue Reglan - will discuss with patient, if                            no benefit, consider domperidone or referral to                            tertiary care center. Will need EKG from primary  care to check QTc before considering other options                           - Await pathology results. Remo Lipps P. Havery Moros, MD 06/04/2022 10:00:28 AM This report has been signed electronically.

## 2022-06-04 NOTE — Progress Notes (Signed)
Called to room to assist during endoscopic procedure.  Patient ID and intended procedure confirmed with present staff. Received instructions for my participation in the procedure from the performing physician.  

## 2022-06-04 NOTE — Progress Notes (Signed)
To pacu, VSS. Report to [email or web address] 

## 2022-06-05 ENCOUNTER — Other Ambulatory Visit: Payer: Self-pay | Admitting: Physician Assistant

## 2022-06-05 ENCOUNTER — Telehealth: Payer: Self-pay

## 2022-06-05 ENCOUNTER — Encounter: Payer: PPO | Admitting: Gastroenterology

## 2022-06-05 NOTE — Telephone Encounter (Signed)
Follow up call placed, no answer, no VM

## 2022-06-07 ENCOUNTER — Other Ambulatory Visit: Payer: Self-pay | Admitting: Primary Care

## 2022-06-07 DIAGNOSIS — E785 Hyperlipidemia, unspecified: Secondary | ICD-10-CM

## 2022-06-08 ENCOUNTER — Encounter: Payer: Self-pay | Admitting: Adult Health

## 2022-06-08 ENCOUNTER — Ambulatory Visit (INDEPENDENT_AMBULATORY_CARE_PROVIDER_SITE_OTHER): Payer: PPO | Admitting: Adult Health

## 2022-06-08 DIAGNOSIS — F411 Generalized anxiety disorder: Secondary | ICD-10-CM

## 2022-06-08 MED ORDER — SERTRALINE HCL 100 MG PO TABS: ORAL_TABLET | ORAL | 1 refills | Status: DC

## 2022-06-08 NOTE — Progress Notes (Signed)
[name] [id number] 07-Feb-1951 72 y.o.  Subjective:   Patient ID:  [name] is a 72 y.o. (DOB June 01, 1950) female.  Chief Complaint: No chief complaint on file.   HPI [name] presents to the office today for follow-up of GAD.   Describes mood today as "ok". Pleasant. Denies tearfulness. Mood symptoms - reports decreased anxiety with addition of Zoloft. Denies depression and irritability. Denies panic attacks. Reports worry, rumination, and over thinking. Denies obsessive thoughts or acts. Mood is consistent. Stating "I've been able to tell some difference with the Zoloft" - "I could tell right away, it knocked the edge off". Has tolerated the '100mg'$  dose and would like to increase to '200mg'$  daily.  Improved interest and motivation. Taking medications as prescribed.  Energy levels stable. Active, has a regular exercise routine 6 days a week. Enjoys some usual interests and activities. Married. Lives with husband. Has 3 children - grandchildren. Spending time with family. Appetite decreased - never gets hungry. Weight loss 123 to 117 pounds - 60". Sleeps better some nights than others.  Focus and concentration stable. Completing tasks. Managing aspects of household. Retired. Denies SI or HI.  Denies AH or VH. Denies self harm. Denies substance use.   Previous medication trials: Trazadone, Amitriptyline, Wellbutrin, Gabapentin '900mg'$  daily, Prozac '80mg'$  daily, Cymbalta '40mg'$  daily, Hydroxyzine '75mg'$  daily, Clonidine 0.'2mg'$  daily and Lamictal '100mg'$  daily, Viibyrd '40mg'$  daily, Clonazepam, Buspar   AUDIT    Flowsheet Row Clinical Support from 10/28/2021 in [place] at [facility] from 10/24/2020 in [place] at [facility]  Alcohol Use Disorder Identification Test Final Score (AUDIT) 4 4      GAD-7    Flowsheet Row Office Visit from 06/24/2017 in [place] at [facility]  Total  GAD-7 Score 10      PHQ2-9    [place] Visit from 04/07/2022 in [place] at [facility] from 10/28/2021 in [place] [facility] from 10/24/2020 in [place] at [facility] Visit from 03/26/2020 in [place] at [facility] from 03/16/2019 in [place] at [facility]  PHQ-2 Total Score 0 0 0 0 0  PHQ-9 Total Score 9 -- 0 0 0      Flowsheet Row Admission (Discharged) from 09/15/2021 in [facility] from 09/10/2021 in [facility] from 09/01/2021 in [facility] at [place] No Risk No Risk No Risk        Review of Systems:  Review of Systems  Musculoskeletal:  Negative for gait problem.  Neurological:  Negative for tremors.  Psychiatric/Behavioral:         Please refer to HPI    Medications: I have reviewed the patient's current medications.  Current Outpatient Medications  Medication Sig Dispense Refill   albuterol (VENTOLIN HFA) 108 (90 Base) MCG/ACT inhaler Inhale 1-2 puffs into the lungs every 6 (six) hours as needed for wheezing or shortness of breath. (Patient not taking: Reported on 06/04/2022) 6.7 g 0   AMBULATORY NON FORMULARY MEDICATION Medication Name: GI cocktail -- 90 ml-2% viscous lidocaine; 90 ml 10 mg/68m dicyclomine ; 270 ml Mylanta 400 mg-  Take 5- 10 ml (1-2 teaspoons) by mouth every 4-6 hours as needed for pain (Patient not taking: Reported on  06/04/2022) 450 mL 1   amitriptyline (ELAVIL) 25 MG tablet Take 1 tablet (25 mg total) by mouth at bedtime. For sleep 90 tablet 0   CALCIUM PO Take 1 tablet by mouth daily.      ibandronate (BONIVA) 150 MG tablet TAKE 1 TABLET BY MOUTH EVERY 30 DAYS FOR BONE DENSITY. 3 tablet 2   lisinopril (ZESTRIL) 20 MG tablet TAKE 1  TABLET BY MOUTH EVERY DAY FOR BLOOD PRESSURE 90 tablet 3   metoCLOPramide (REGLAN) 10 MG tablet Take 10 mg by mouth 4 (four) times daily. (Patient not taking: Reported on 06/04/2022)     Multiple Vitamins-Minerals (MULTIVITAMIN PO) Take 1 tablet by mouth daily.      pantoprazole (PROTONIX) 40 MG tablet TAKE 1 TABLET BY MOUTH TWICE A DAY 180 tablet 1   rosuvastatin (CRESTOR) 5 MG tablet TAKE 1 TABLET BY MOUTH EVERY DAY FOR CHOLESTEROL 90 tablet 2   sertraline (ZOLOFT) 100 MG tablet Take 1 tablet (100 mg total) by mouth daily. 90 tablet 0   sucralfate (CARAFATE) 1 GM/10ML suspension Take 10 mLs (1 g total) by mouth 4 (four) times daily. 420 mL 3   No current facility-administered medications for this visit.    Medication Side Effects: None  Allergies: No Known Allergies  Past Medical History:  Diagnosis Date   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   Anxiety    Asthma    as a child   CAP (community acquired pneumonia)    Cataract 2018   removed both eyes   COLONIC POLYPS, HX OF 11/15/2009   DEPRESSION 11/14/2009   GASTROJEJ ULCR UNS ACUT/CHRN W/O HEMOR PERF/OBST 11/06/2009   GERD (gastroesophageal reflux disease)    not on meds   Headache(784.0) 11/15/2009   Heart murmur    never bothered pt. always been told this   HEART MURMUR, HX OF 11/15/2009   HIATAL HERNIA 10/08/2009   Large   HIP FRACTURE, LEFT 11/14/2009   History of hiatal hernia    HYPERLIPIDEMIA 11/14/2009   HYPERTENSION 11/14/2009   OSTEOPOROSIS 11/14/2009   Prolonged Q-T interval on ECG 09/02/2021   WEIGHT GAIN 01/10/2010    Past Medical History, Surgical history, Social history, and Family history were reviewed and updated as appropriate.   Please see review of systems for further details on the patient's review from today.   Objective:   Physical Exam:  There were no vitals taken for this visit.  Physical Exam Constitutional:      General: She is not in acute distress. Musculoskeletal:        General: No  deformity.  Neurological:     Mental Status: She is alert and oriented to person, place, and time.     Coordination: Coordination normal.  Psychiatric:        Attention and Perception: Attention and perception normal. She does not perceive auditory or visual hallucinations.        Mood and Affect: Mood normal. Mood is not anxious or depressed. Affect is not labile, blunt, angry or inappropriate.        Speech: Speech normal.        Behavior: Behavior normal.        Thought Content: Thought content normal. Thought content is not paranoid or delusional. Thought content does not include homicidal or suicidal ideation. Thought content does not include homicidal or suicidal plan.        Cognition and Memory: Cognition and memory normal.        Judgment: Judgment normal.  Comments: Insight intact     Lab Review:     Component Value Date/Time   NA 136 06/02/2022 1210   K 3.7 06/02/2022 1210   CL 96 06/02/2022 1210   CO2 27 06/02/2022 1210   GLUCOSE 98 06/02/2022 1210   BUN 24 (H) 06/02/2022 1210   CREATININE 0.91 06/02/2022 1210   CREATININE 1.01 (H) 02/25/2018 1448   CALCIUM 11.3 (H) 06/02/2022 1210   PROT 7.3 06/02/2022 1210   ALBUMIN 4.4 06/02/2022 1210   AST 19 06/02/2022 1210   ALT 11 06/02/2022 1210   ALKPHOS 57 06/02/2022 1210   BILITOT 0.4 06/02/2022 1210   GFRNONAA >60 09/16/2021 0438   GFRAA >60 01/02/2017 0515       Component Value Date/Time   WBC 9.4 06/02/2022 1210   RBC 4.16 06/02/2022 1210   HGB 13.0 06/02/2022 1210   HCT 37.7 06/02/2022 1210   PLT 294.0 06/02/2022 1210   MCV 90.7 06/02/2022 1210   MCH 32.3 09/16/2021 0438   MCHC 34.3 06/02/2022 1210   RDW 13.2 06/02/2022 1210   LYMPHSABS 2.8 06/02/2022 1210   MONOABS 0.7 06/02/2022 1210   EOSABS 0.2 06/02/2022 1210   BASOSABS 0.1 06/02/2022 1210    No results found for: "POCLITH", "LITHIUM"   No results found for: "PHENYTOIN", "PHENOBARB", "VALPROATE", "CBMZ"   .res Assessment: Plan:     Plan:  PDMP reviewed  Increase Zoloft '100mg'$  to '200mg'$  daily  Also taking Elavil '20mg'$  at bedtime.  RTC 4 weeks  Patient advised to contact office with any questions, adverse effects, or acute worsening in signs and symptoms.   Time spent with patient was 20 minutes. Greater than 50% of face to face time with patient was spent on counseling and coordination of care.     There are no diagnoses linked to this encounter.   Please see After Visit Summary for patient specific instructions.  Future Appointments  Date Time Provider [place]  08/11/2022  3:30 PM Debbora Presto, NP [facility] None  11/02/2022  1:15 PM [facility] NURSE HEALTH ADVISOR [facility]    No orders of the defined types were placed in this encounter.   -------------------------------

## 2022-06-12 ENCOUNTER — Ambulatory Visit (INDEPENDENT_AMBULATORY_CARE_PROVIDER_SITE_OTHER): Payer: PPO | Admitting: Primary Care

## 2022-06-12 ENCOUNTER — Encounter: Payer: Self-pay | Admitting: Primary Care

## 2022-06-12 VITALS — BP 122/70 | HR 95 | Temp 98.1°F | Ht 60.0 in | Wt 121.0 lb

## 2022-06-12 DIAGNOSIS — G4700 Insomnia, unspecified: Secondary | ICD-10-CM

## 2022-06-12 DIAGNOSIS — Z79899 Other long term (current) drug therapy: Secondary | ICD-10-CM | POA: Diagnosis not present

## 2022-06-12 DIAGNOSIS — F419 Anxiety disorder, unspecified: Secondary | ICD-10-CM

## 2022-06-12 DIAGNOSIS — F32A Depression, unspecified: Secondary | ICD-10-CM | POA: Diagnosis not present

## 2022-06-12 NOTE — Assessment & Plan Note (Signed)
Improving, not quite at goal.  Will gradually increase amitriptyline to 50 mg HS. Instructions provided.  She will update in 1 week.

## 2022-06-12 NOTE — Assessment & Plan Note (Signed)
Improving!! Following with psychiatry.   Continue Zoloft 200 mg daily. Increasing amitriptyline gradually to 50 mg HS as this has been effective for sleep.   ECG completed today.  She will update in one week.

## 2022-06-12 NOTE — Progress Notes (Signed)
Subjective:    Patient ID: [name], female    DOB: 1951-04-05, 72 y.o.   MRN: [id number]  Insomnia Primary symptoms: sleep disturbance.      [name] is a very pleasant 72 y.o. female with a history of insomnia, anxiety and depression, chronic migraines, hypertension, hyperlipidemia who presents today for ECG and to discuss insomnia.  Following with psychiatry, last office visit was 06/08/22. Currently managed on Zoloft 200 mg which was increased during her recent visit, Elvail 25 mg HS. During this visit she mentioned improvement in anxiety with Zoloft.   Today she is sleeping somewhat better with amitriptyline. She has some good nights and some bad nights, about 50/50. Her anxiety has improved since starting on Zoloft, feels well managed.   She completed an upper endoscopy on 06/04/22 for persistent GERD. Findings were consistent for severe reflux esophagitis and delayed gastric emptying. She is not doing well on Reglan so it was recommended she try domperidone or referral to a tertiary care center. She needs ECG today for evaluation of QTc prior to initiation of further options.   Today she report feeling much better since the endoscopy. She is managed on Reglan, pantoprazole 40 mg daily, and Carafate.   BP Readings from Last 3 Encounters:  06/12/22 122/70  06/04/22 (!) 141/73  06/02/22 (!) 88/62      Review of Systems  Gastrointestinal:        GERD symptoms reduced.  Psychiatric/Behavioral:  Positive for sleep disturbance. The patient is nervous/anxious and has insomnia.          Past Medical History:  Diagnosis Date   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   Anxiety    Asthma    as a child   CAP (community acquired pneumonia)    Cataract 2018   removed both eyes   COLONIC POLYPS, HX OF 11/15/2009   DEPRESSION 11/14/2009   GASTROJEJ ULCR UNS ACUT/CHRN W/O HEMOR PERF/OBST 11/06/2009   GERD (gastroesophageal reflux disease)    not on meds    Headache(784.0) 11/15/2009   Heart murmur    never bothered pt. always been told this   HEART MURMUR, HX OF 11/15/2009   HIATAL HERNIA 10/08/2009   Large   HIP FRACTURE, LEFT 11/14/2009   History of hiatal hernia    HYPERLIPIDEMIA 11/14/2009   HYPERTENSION 11/14/2009   OSTEOPOROSIS 11/14/2009   Prolonged Q-T interval on ECG 09/02/2021   WEIGHT GAIN 01/10/2010    Social History   Socioeconomic History   Marital status: Married    Spouse name: [name]   Number of children: 3   Years of education: BA   Highest education level: Not on file  Occupational History   Occupation: IT consultant and Spa  Tobacco Use   Smoking status: Former    Years: 15.00    Types: Cigarettes    Quit date: 05/04/1982    Years since quitting: 40.1   Smokeless tobacco: Never   Tobacco comments:    Married, 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[place], 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 (stress)  Vaping Use   Vaping Use: Never used  Substance and Sexual Activity   Alcohol use: Not Currently    Alcohol/week: 1.0 standard drink of alcohol    Types: 1 Glasses of wine per week    Comment: nightly   Drug use: No   Sexual activity: Not Currently    Birth control/protection: None  Other Topics Concern   Not  on file  Social History Narrative   Lives with husband   Caffeine use: 1 cup or less    Right handed   Social Determinants of Health   Financial Resource Strain: Low Risk  (10/28/2021)   Overall Financial Resource Strain (CARDIA)    Difficulty of Paying Living Expenses: Not hard at all  Food Insecurity: No Food Insecurity (10/28/2021)   Hunger Vital Sign    Worried About Running Out of Food in the Last Year: Never true    Ran Out of Food in the Last Year: Never true  Transportation Needs: No Transportation Needs (10/28/2021)   PRAPARE - Hydrologist (Medical): No    Lack of Transportation (Non-Medical): No  Physical Activity: Inactive  (10/28/2021)   Exercise Vital Sign    Days of Exercise per Week: 0 days    Minutes of Exercise per Session: 0 min  Stress: No Stress Concern Present (10/28/2021)   [place]    Feeling of Stress : Not at all  Social Connections: Moderately Isolated (10/28/2021)   Social Connection and Isolation Panel [NHANES]    Frequency of Communication with Friends and Family: More than three times a week    Frequency of Social Gatherings with Friends and Family: More than three times a week    Attends Religious Services: Never    Marine scientist or Organizations: No    Attends Archivist Meetings: Never    Marital Status: Married  Human resources officer Violence: Not At Risk (10/28/2021)   Humiliation, Afraid, Rape, and Kick questionnaire    Fear of Current or Ex-Partner: No    Emotionally Abused: No    Physically Abused: No    Sexually Abused: No    Past Surgical History:  Procedure Laterality Date   APPENDECTOMY     AUGMENTATION MAMMAPLASTY Bilateral    BACK SURGERY  2003   Bleeding ulcers  10/2009   Broken Hip Left 06/2001   CATARACT EXTRACTION Bilateral 01/2017   COLONOSCOPY N/A 12/30/2016   Procedure: COLONOSCOPY WITH PROPOFOL;  Surgeon: Leighton Ruff, MD;  Location: [facility];  Service: Endoscopy;  Laterality: N/A;   COLONOSCOPY  2011   inadequate prep, polyps   GASTROSTOMY N/A 09/15/2021   Procedure: INSERTION OF GASTROSTOMY TUBE;  Surgeon: Clovis Riley, MD;  Location: [facility];  Service: General;  Laterality: N/A;   NECK SURGERY  2004 & 2006   x's 2   POLYPECTOMY     RECTAL PROLAPSE REPAIR  01/2017   XI ROBOTIC ASSISTED PARAESOPHAGEAL HERNIA REPAIR N/A 09/15/2021   Procedure: XI ROBOTIC ASSISTED PARAESOPHAGEAL HERNIA REPAIR WITH GASTROPEXY;  Surgeon: Clovis Riley, MD;  Location: [facility];  Service: General;  Laterality: N/A;    Family History  Problem Relation Age of Onset   Bone cancer Mother     Hypertension Other    Allergic rhinitis Neg Hx    Asthma Neg Hx    Urticaria Neg Hx    Colon cancer Neg Hx    Colon polyps Neg Hx    Esophageal cancer Neg Hx    Rectal cancer Neg Hx    Stomach cancer Neg Hx    Breast cancer Neg Hx     No Known Allergies  Current Outpatient Medications on File Prior to Visit  Medication Sig Dispense Refill   amitriptyline (ELAVIL) 25 MG tablet Take 1 tablet (25 mg total) by mouth at bedtime. For sleep  90 tablet 0   CALCIUM PO Take 1 tablet by mouth daily.      ibandronate (BONIVA) 150 MG tablet TAKE 1 TABLET BY MOUTH EVERY 30 DAYS FOR BONE DENSITY. 3 tablet 2   lisinopril (ZESTRIL) 20 MG tablet TAKE 1 TABLET BY MOUTH EVERY DAY FOR BLOOD PRESSURE 90 tablet 3   metoCLOPramide (REGLAN) 10 MG tablet Take 10 mg by mouth 4 (four) times daily.     Multiple Vitamins-Minerals (MULTIVITAMIN PO) Take 1 tablet by mouth daily.      pantoprazole (PROTONIX) 40 MG tablet TAKE 1 TABLET BY MOUTH TWICE A DAY 180 tablet 1   rosuvastatin (CRESTOR) 5 MG tablet TAKE 1 TABLET BY MOUTH EVERY DAY FOR CHOLESTEROL 90 tablet 2   sertraline (ZOLOFT) 100 MG tablet Take two tablets daily. 180 tablet 1   sucralfate (CARAFATE) 1 GM/10ML suspension Take 10 mLs (1 g total) by mouth 4 (four) times daily. 420 mL 3   albuterol (VENTOLIN HFA) 108 (90 Base) MCG/ACT inhaler Inhale 1-2 puffs into the lungs every 6 (six) hours as needed for wheezing or shortness of breath. (Patient not taking: Reported on 06/04/2022) 6.7 g 0   AMBULATORY NON FORMULARY MEDICATION Medication Name: GI cocktail -- 90 ml-2% viscous lidocaine; 90 ml 10 mg/80m dicyclomine ; 270 ml Mylanta 400 mg-  Take 5- 10 ml (1-2 teaspoons) by mouth every 4-6 hours as needed for pain (Patient not taking: Reported on 06/04/2022) 450 mL 1   No current facility-administered medications on file prior to visit.    BP 122/70   Pulse 95   Temp 98.1 F (36.7 C) (Temporal)   Ht 5' (1.524 m)   Wt 121 lb (54.9 kg)   SpO2 98%   BMI  23.63 kg/m  Objective:   Physical Exam Cardiovascular:     Rate and Rhythm: Normal rate and regular rhythm.  Pulmonary:     Effort: Pulmonary effort is normal.  Musculoskeletal:     Cervical back: Neck supple.  Skin:    General: Skin is warm and dry.           Assessment & Plan:  Insomnia, unspecified type Assessment & Plan: Improving, not quite at goal.  Will gradually increase amitriptyline to 50 mg HS. Instructions provided.  She will update in 1 week.    High risk medication use Assessment & Plan: ECG completed for potentially other options for GERD. Will send to GI.  Orders: -     EKG 12-Lead  Anxiety and depression Assessment & Plan: Improving!! Following with psychiatry.   Continue Zoloft 200 mg daily. Increasing amitriptyline gradually to 50 mg HS as this has been effective for sleep.   ECG completed today.  She will update in one week.         KPleas Koch NP

## 2022-06-12 NOTE — Patient Instructions (Signed)
Increase your amitriptyline up to 50 mg. Take 1 and 1/2 tablet of you 25 mg pills for a few days, then increase to 50 mg thereafter.  Please update me in 1 week!  It was a pleasure to see you today!

## 2022-06-12 NOTE — Assessment & Plan Note (Signed)
ECG completed for potentially other options for GERD. Will send to GI.

## 2022-06-22 DIAGNOSIS — G4700 Insomnia, unspecified: Secondary | ICD-10-CM

## 2022-06-23 MED ORDER — AMITRIPTYLINE HCL 50 MG PO TABS: 50.0000 mg | ORAL_TABLET | Freq: Every day | ORAL | 2 refills | Status: DC

## 2022-06-25 ENCOUNTER — Ambulatory Visit: Payer: PPO | Admitting: Adult Health

## 2022-06-25 ENCOUNTER — Encounter: Payer: Self-pay | Admitting: Adult Health

## 2022-06-25 DIAGNOSIS — F411 Generalized anxiety disorder: Secondary | ICD-10-CM | POA: Diagnosis not present

## 2022-06-25 NOTE — Progress Notes (Signed)
[name] [id number] Jul 18, 1950 72 y.o.  Subjective:   Patient ID:  [name] is a 72 y.o. (DOB 22-Jun-1950) female.  Chief Complaint: No chief complaint on file.   HPI [name] presents to the office today for follow-up of GAD.   Describes mood today as "ok". Pleasant. Denies tearfulness. Mood symptoms - denies depression, anxiety, and irritability. Denies panic attacks. Reports some worry, rumination, and over thinking - "trying to keep things rational". Denies obsessive thoughts or acts. Mood is consistent. Stating "I'm doing good". Feels like the Zoloft is working "great" for her. Improved interest and motivation. Taking medications as prescribed.  Energy levels stable. Active, has a regular exercise routine 6 days a week. Enjoys some usual interests and activities. Married. Lives with husband. Has 3 children - grandchildren. Spending time with family. Appetite adequate. Weight loss - 117 pounds - 60". Sleeps well most nights. Averages 8 hours.  Focus and concentration stable. Completing tasks. Managing aspects of household. Retired. Denies SI or HI.  Denies AH or VH. Denies self harm. Denies substance use.   Previous medication trials: Trazadone, Amitriptyline, Wellbutrin, Gabapentin 970m daily, Prozac 832mdaily, Cymbalta 403maily, Hydroxyzine 4m39mily, Clonidine 0.2mg 4mly and Lamictal 100mg 54my, Viibyrd 40mg d7m, Clonazepam, Buspar   AUDIT    Flowsheet Row [facility] [place] Final Score (AUDIT) 4 4      GAD-7    Flowsheet Row [facility] GAD-7 Score 10      PHQ2-9    FlowsheAniwa Visit from [facility]2022 [facility] [place] from [facility]20[facility] [place]/[facility]20[facility] [place] [place] [facility] Total Score 0 0 0 0 0  PHQ-9 Total Score -- 9 -- 0 0      Flowsheet Row Ad[facility] (Dis[facility]) [facility] [place] [facility] [place] [facility] No [facility] No Risk        Review of Systems:  Review of Systems  Musculoskeletal:  Negative for gait problem.  Neurological:  Negative for tremors.  Psychiatric/Behavioral:         Please refer to HPI    Medications: I have reviewed the patient's current medications.  Current Outpatient Medications  Medication Sig Dispense Refill   albuterol (VENTOLIN HFA) 108 (90 Base) MCG/ACT inhaler Inhale 1-2 puffs into the lungs every 6 (six) hours as needed for wheezing or shortness of breath. (Patient not taking: Reported on 06/04/2022) 6.7 g 0   AMBULATORY NON FORMULARY MEDICATION Medication Name: GI cocktail -- 90 ml-2% viscous lidocaine; 90 ml 10 mg/5ml dic4momine ; 270 ml Mylanta 400 mg-  Take 5- 10 ml (1-2 teaspoons) by mouth every 4-6 hours as needed for pain (Patient not taking: Reported on 06/04/2022) 450 mL 1   amitriptyline (ELAVIL) 50 MG tablet Take 1 tablet (50 mg total) by mouth at bedtime. For sleep 90 tablet 2  CALCIUM PO Take 1 tablet by mouth daily.      ibandronate (BONIVA) 150 MG tablet TAKE 1 TABLET BY MOUTH EVERY 30 DAYS FOR BONE DENSITY. 3 tablet 2   lisinopril (ZESTRIL) 20 MG tablet TAKE 1 TABLET BY MOUTH EVERY DAY FOR BLOOD PRESSURE 90 tablet 3   metoCLOPramide (REGLAN) 10 MG tablet Take 10 mg by mouth 4 (four) times daily.      Multiple Vitamins-Minerals (MULTIVITAMIN PO) Take 1 tablet by mouth daily.      pantoprazole (PROTONIX) 40 MG tablet TAKE 1 TABLET BY MOUTH TWICE A DAY 180 tablet 1   rosuvastatin (CRESTOR) 5 MG tablet TAKE 1 TABLET BY MOUTH EVERY DAY FOR CHOLESTEROL 90 tablet 2   sertraline (ZOLOFT) 100 MG tablet Take two tablets daily. 180 tablet 1   sucralfate (CARAFATE) 1 GM/10ML suspension Take 10 mLs (1 g total) by mouth 4 (four) times daily. 420 mL 3   No current facility-administered medications for this visit.    Medication Side Effects: None  Allergies: No Known Allergies  Past Medical History:  Diagnosis Date   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   Anxiety    Asthma    as a child   CAP (community acquired pneumonia)    Cataract 2018   removed both eyes   COLONIC POLYPS, HX OF 11/15/2009   DEPRESSION 11/14/2009   GASTROJEJ ULCR UNS ACUT/CHRN W/O HEMOR PERF/OBST 11/06/2009   GERD (gastroesophageal reflux disease)    not on meds   Headache(784.0) 11/15/2009   Heart murmur    never bothered pt. always been told this   HEART MURMUR, HX OF 11/15/2009   HIATAL HERNIA 10/08/2009   Large   HIP FRACTURE, LEFT 11/14/2009   History of hiatal hernia    HYPERLIPIDEMIA 11/14/2009   HYPERTENSION 11/14/2009   OSTEOPOROSIS 11/14/2009   Prolonged Q-T interval on ECG 09/02/2021   WEIGHT GAIN 01/10/2010    Past Medical History, Surgical history, Social history, and Family history were reviewed and updated as appropriate.   Please see review of systems for further details on the patient's review from today.   Objective:   Physical Exam:  There were no vitals taken for this visit.  Physical Exam Constitutional:      General: She is not in acute distress. Musculoskeletal:        General: No deformity.  Neurological:     Mental Status: She is alert and oriented to person, place, and time.     Coordination: Coordination normal.  Psychiatric:        Attention and Perception: Attention and  perception normal. She does not perceive auditory or visual hallucinations.        Mood and Affect: Mood normal. Mood is not anxious or depressed. Affect is not labile, blunt, angry or inappropriate.        Speech: Speech normal.        Behavior: Behavior normal.        Thought Content: Thought content normal. Thought content is not paranoid or delusional. Thought content does not include homicidal or suicidal ideation. Thought content does not include homicidal or suicidal plan.        Cognition and Memory: Cognition and memory normal.        Judgment: Judgment normal.     Comments: Insight intact     Lab Review:     Component Value Date/Time   NA 136 06/02/2022 1210   K 3.7 06/02/2022 1210   CL 96 06/02/2022 1210  CO2 27 06/02/2022 1210   GLUCOSE 98 06/02/2022 1210   BUN 24 (H) 06/02/2022 1210   CREATININE 0.91 06/02/2022 1210   CREATININE 1.01 (H) 02/25/2018 1448   CALCIUM 11.3 (H) 06/02/2022 1210   PROT 7.3 06/02/2022 1210   ALBUMIN 4.4 06/02/2022 1210   AST 19 06/02/2022 1210   ALT 11 06/02/2022 1210   ALKPHOS 57 06/02/2022 1210   BILITOT 0.4 06/02/2022 1210   GFRNONAA >60 09/16/2021 0438   GFRAA >60 01/02/2017 0515       Component Value Date/Time   WBC 9.4 06/02/2022 1210   RBC 4.16 06/02/2022 1210   HGB 13.0 06/02/2022 1210   HCT 37.7 06/02/2022 1210   PLT 294.0 06/02/2022 1210   MCV 90.7 06/02/2022 1210   MCH 32.3 09/16/2021 0438   MCHC 34.3 06/02/2022 1210   RDW 13.2 06/02/2022 1210   LYMPHSABS 2.8 06/02/2022 1210   MONOABS 0.7 06/02/2022 1210   EOSABS 0.2 06/02/2022 1210   BASOSABS 0.1 06/02/2022 1210    No results found for: "POCLITH", "LITHIUM"   No results found for: "PHENYTOIN", "PHENOBARB", "VALPROATE", "CBMZ"   .res Assessment: Plan:    Plan:  PDMP reviewed  Continue:  Zoloft 285m daily  Also taking Elavil 576mat bedtime.  RTC 3 months  Patient advised to contact office with any questions, adverse effects, or acute worsening in  signs and symptoms.  Time spent with patient was 20 minutes. Greater than 50% of face to face time with patient was spent on counseling and coordination of care.    There are no diagnoses linked to this encounter.   Please see After Visit Summary for patient specific instructions.  Future Appointments  Date Time Provider [place]3/13/2024  4:00 PM Armbruster, StCarlota RaspberryMD [facility]4/01/2023  3:30 PM LoDebbora PrestoNP [facility] None  11/02/2022  1:15 PM [facility] NURSE HEALTH ADVISOR [facility]  12/24/2022  2:00 PM Roberta Angell, ReBerdie OgrenNP CP-CP None    No orders of the defined types were placed in this encounter.   -------------------------------

## 2022-06-28 ENCOUNTER — Other Ambulatory Visit: Payer: Self-pay | Admitting: Primary Care

## 2022-06-28 DIAGNOSIS — G4700 Insomnia, unspecified: Secondary | ICD-10-CM

## 2022-07-13 ENCOUNTER — Other Ambulatory Visit: Payer: Self-pay | Admitting: Primary Care

## 2022-07-13 DIAGNOSIS — G4700 Insomnia, unspecified: Secondary | ICD-10-CM

## 2022-07-15 ENCOUNTER — Ambulatory Visit (INDEPENDENT_AMBULATORY_CARE_PROVIDER_SITE_OTHER): Payer: PPO | Admitting: Gastroenterology

## 2022-07-15 ENCOUNTER — Encounter: Payer: Self-pay | Admitting: Gastroenterology

## 2022-07-15 VITALS — BP 110/74 | HR 64 | Ht 60.0 in | Wt 117.2 lb

## 2022-07-15 DIAGNOSIS — K219 Gastro-esophageal reflux disease without esophagitis: Secondary | ICD-10-CM

## 2022-07-15 DIAGNOSIS — K2210 Ulcer of esophagus without bleeding: Secondary | ICD-10-CM | POA: Diagnosis not present

## 2022-07-15 DIAGNOSIS — K3184 Gastroparesis: Secondary | ICD-10-CM | POA: Diagnosis not present

## 2022-07-15 NOTE — Progress Notes (Signed)
HPI :  72 year old female here for a follow-up visit for gastroparesis, reflux, epigastric pain. Recall she previously followed with Dr. Ardis Hughs, I met her last month.  Recall she had a semiurgent repair of an incarcerated paraesophageal hernia with gastropexy in May 2023.  After that time she was having problems with nausea and vomiting and reflux.  She was prescribed some Reglan last year in June.  In September she had an upper GI study showing retained food in the stomach, changes concerning for gastritis.  In September 2023 she had delayed gastric emptying on GES.  When she saw [place] in the office she was having a lot of nausea vomiting, epigastric pain, poorly controlled despite using Reglan 10 mg 4 times daily and Carafate.  We started her on Protonix and performed an upper endoscopy on February 1.  She had severe LA grade D esophagitis, small hiatal hernia, retained food in her stomach without any gastric outlet obstruction.  She continued Protonix twice daily, we changed her Carafate to liquid formulation, continued Reglan while we ordered an EKG to check her QTc.  EKG looked okay with normal QTc that was done by her PCP.  Neck step was going to consider domperidone.  Will recontact her to coordinate that she states her symptoms had significantly improved after the EGD and she states essentially since that time she has been asymptomatic.  She has stopped Reglan, stopped Carafate, still taking Protonix 40 mg twice daily.  She is eating normally.  No abdominal pain.  No nausea or vomiting.  Her appetite is good.  No reflux symptoms anymore.  She did have dental work and has been limited with some food she can eat which she thinks is limited her weight gain but generally she is doing really well.  We discussed long-term plan, she has been on Protonix for several months now.  She does have osteoporosis.   03/07/2021 colonoscopy for history of colon polyps and previous sigmoid resection and  rectopexy in 2018 with two 4-8 mm polyps in the transverse colon and in the ascending colon.  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  Repeat recommended in 3 years.  01/28/2022 gastric emptying study with delayed gastric emptying.  EGD 06/04/22: - Esophagogastric landmarks were identified: the Z-line was found at 35 cm, the gastroesophageal junction was found at 35 cm and the upper extent of the gastric folds was found at 38 cm from the incisors. Findings: - A 3 cm hiatal hernia was present. - Severe LA Grade D esophagitis was found 29 to 35 cm from the incisors. Biopsies were taken with a cold forceps for histology to ensure no viral etiology. - The exam of the esophagus was otherwise normal. - Food (residue) was found in the gastric antrum - most suctioned, but could not clear antrum of pathology given this finding. - The exam of the stomach was otherwise normal. Pylorus was widely patent - no evidence of gastric outlet obstruction - The examined duodenum was normal.  Surgical [P], esophagus bx - EROSIVE ESOPHAGITIS. - A PAS STAIN IS NEGATIVE FOR FUNGAL FORMS. - IMMUNOHISTOCHEMICAL STAINS FOR CMV AND HSV 1/2 ARE INTERPRETED AS NEGATIVE (NONSPECIFIC HSV-1 STAINING).  EKG done per PCP - QTc normal   Past Medical History:  Diagnosis Date   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   Anxiety    Asthma    as a child   CAP (community acquired pneumonia)    Cataract 2018   removed both eyes   COLONIC POLYPS,  HX OF 11/15/2009   DEPRESSION 11/14/2009   GASTROJEJ ULCR UNS ACUT/CHRN W/O HEMOR PERF/OBST 11/06/2009   GERD (gastroesophageal reflux disease)    not on meds   Headache(784.0) 11/15/2009   Heart murmur    never bothered pt. always been told this   HEART MURMUR, HX OF 11/15/2009   HIATAL HERNIA 10/08/2009   Large   HIP FRACTURE, LEFT 11/14/2009   History of hiatal hernia    HYPERLIPIDEMIA 11/14/2009   HYPERTENSION 11/14/2009   OSTEOPOROSIS 11/14/2009   Prolonged Q-T  interval on ECG 09/02/2021   WEIGHT GAIN 01/10/2010     Past Surgical History:  Procedure Laterality Date   APPENDECTOMY     AUGMENTATION MAMMAPLASTY Bilateral    BACK SURGERY  2003   Bleeding ulcers  10/2009   Broken Hip Left 06/2001   CATARACT EXTRACTION Bilateral 01/2017   COLONOSCOPY N/A 12/30/2016   Procedure: COLONOSCOPY WITH PROPOFOL;  Surgeon: Leighton Ruff, MD;  Location: [facility];  Service: Endoscopy;  Laterality: N/A;   COLONOSCOPY  2011   inadequate prep, polyps   GASTROSTOMY N/A 09/15/2021   Procedure: INSERTION OF GASTROSTOMY TUBE;  Surgeon: Clovis Riley, MD;  Location: [facility];  Service: General;  Laterality: N/A;   NECK SURGERY  2004 & 2006   x's 2   POLYPECTOMY     RECTAL PROLAPSE REPAIR  01/2017   XI ROBOTIC ASSISTED PARAESOPHAGEAL HERNIA REPAIR N/A 09/15/2021   Procedure: XI ROBOTIC ASSISTED PARAESOPHAGEAL HERNIA REPAIR WITH GASTROPEXY;  Surgeon: Clovis Riley, MD;  Location: [facility];  Service: General;  Laterality: N/A;   Family History  Problem Relation Age of Onset   Bone cancer Mother    Hypertension Other    Allergic rhinitis Neg Hx    Asthma Neg Hx    Urticaria Neg Hx    Colon cancer Neg Hx    Colon polyps Neg Hx    Esophageal cancer Neg Hx    Rectal cancer Neg Hx    Stomach cancer Neg Hx    Breast cancer Neg Hx    Social History   Tobacco Use   Smoking status: Former    Years: 15.00    Types: Cigarettes    Quit date: 05/04/1982    Years since quitting: 40.2   Smokeless tobacco: Never   Tobacco comments:    Married, 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 For sleep 90 tablet 2   CALCIUM PO Take 1 tablet by mouth daily.      ibandronate (BONIVA) 150 MG tablet TAKE 1 TABLET BY MOUTH EVERY 30 DAYS FOR BONE DENSITY. 3 tablet 2   lisinopril (ZESTRIL) 20 MG tablet TAKE 1 TABLET BY MOUTH EVERY DAY FOR BLOOD PRESSURE 90 tablet 3   Multiple Vitamins-Minerals (MULTIVITAMIN PO) Take 1 tablet by mouth daily.      pantoprazole (PROTONIX) 40 MG tablet TAKE 1 TABLET BY MOUTH TWICE A DAY 180 tablet 1   rosuvastatin (CRESTOR) 5 MG tablet TAKE 1 TABLET  BY MOUTH EVERY DAY FOR CHOLESTEROL 90 tablet 2   sertraline (ZOLOFT) 100 MG tablet Take two tablets daily. 180 tablet 1   metoCLOPramide (REGLAN) 10 MG tablet Take 10 mg by mouth 4 (four) times daily. (Patient not taking: Reported on 07/15/2022)     sucralfate (CARAFATE) 1 GM/10ML suspension Take 10 mLs (1 g total) by mouth 4 (four) times daily. (Patient not taking: Reported on 07/15/2022) 420 mL 3   No current facility-administered medications for this visit.   No Known Allergies   Review of Systems: All systems reviewed and negative except where noted in HPI.   Lab Results  Component Value Date   WBC 9.4 06/02/2022   HGB 13.0 06/02/2022   HCT 37.7 06/02/2022   MCV 90.7 06/02/2022   PLT 294.0 06/02/2022    Lab Results  Component Value Date   CREATININE 0.91 06/02/2022   BUN 24 (H) 06/02/2022   NA 136 06/02/2022   K 3.7 06/02/2022   CL 96 06/02/2022   CO2 27 06/02/2022    Lab Results  Component Value Date   ALT 11 06/02/2022   AST 19 06/02/2022   ALKPHOS 57 06/02/2022   BILITOT 0.4 06/02/2022     Physical Exam: BP 110/74   Pulse 64   Ht 5' (1.524 m)   Wt 117 lb 3.2 oz (53.2 kg)   BMI 22.89 kg/m  Constitutional: Pleasant,well-developed, female in no acute distress. Neurological: Alert and oriented to person place and time. Psychiatric: Normal mood and affect. Behavior is normal.   ASSESSMENT: 73 y.o. female  here for assessment of the following  1. Erosive esophagitis   2. Gastroparesis   3. Gastroesophageal reflux disease, unspecified whether esophagitis present    As above, post semiemergent paraesophageal hernia repair, appeared to develop postoperative gastroparesis that persisted for months.  Despite Reglan and PPI had severe erosive changes in her esophagus on EGD with no evidence of gastric outlet obstruction.  I was prepared to switch her to try domperidone however following the EGD and increasing her PPI she is done remarkably well and her symptoms have since resolved.  She has been off Reglan and Carafate for a few weeks now and doing quite well on Protonix twice daily.  Unclear what changed in regards to why she got better so quickly however very happy to hear she is doing well.  She will be mindful of symptoms moving forward, if they recur she will contact me.  Otherwise we did discuss long-term risks of chronic PPI, she has osteoporosis, would like to keep her on a lower dose if possible.  I am recommending a repeat EGD to confirm mucosal healing of esophagitis and rule out any Barrett's esophagus given chronicity of some of the symptoms.  I discussed EGD and anesthesia, she is agreeable to do this in April.  Assuming no significant Barrett's, will likely reduce her dose to once daily and titrate further as she tolerates.  She due to the plan.  PLAN: - EGD at the [facility] in April to confirm healing of esophagitis - continue protonix '40mg'$  BID until then, long term use lowest dose needed, will await EGD before we try to reduce dose - holding off on Reglan / carafate for now  Jo Mango, MD [facility]

## 2022-07-15 NOTE — Patient Instructions (Addendum)
You have been scheduled for an endoscopy. Please follow written instructions given to you at your visit today. If you use inhalers (even only as needed), please bring them with you on the day of your procedure.   Thank you for entrusting me with your care and for choosing [facility], Dr. Pacific Cellar    If your blood pressure at your visit was 140/90 or greater, please contact your primary care physician to follow up on this.  _______________________________________________________  If you are age 61 or older, your body mass index should be between 23-30. Your Body mass index is 22.89 kg/m. If this is out of the aforementioned range listed, please consider follow up with your Primary Care Provider.  If you are age 31 or younger, your body mass index should be between 19-25. Your Body mass index is 22.89 kg/m. If this is out of the aformentioned range listed, please consider follow up with your Primary Care Provider.   ________________________________________________________  The [place] providers would like to encourage you to use [facility] to communicate with providers for non-urgent requests or questions.  Due to long hold times on the telephone, sending your provider a message by [facility] may be a faster and more efficient way to get a response.  Please allow 48 business hours for a response.  Please remember that this is for non-urgent requests.  _______________________________________________________  Due to recent changes in healthcare laws, you may see the results of your imaging and laboratory studies on MyChart before your provider has had a chance to review them.  We understand that in some cases there may be results that are confusing or concerning to you. Not all laboratory results come back in the same time frame and the provider may be waiting for multiple results in order to interpret others.  Please give [place] 48 hours in order for your provider to thoroughly review  all the results before contacting the office for clarification of your results.

## 2022-07-23 ENCOUNTER — Other Ambulatory Visit: Payer: Self-pay | Admitting: Adult Health

## 2022-07-23 DIAGNOSIS — F411 Generalized anxiety disorder: Secondary | ICD-10-CM

## 2022-08-11 ENCOUNTER — Encounter: Payer: Self-pay | Admitting: Family Medicine

## 2022-08-11 ENCOUNTER — Ambulatory Visit: Payer: PPO | Admitting: Family Medicine

## 2022-08-11 DIAGNOSIS — G43709 Chronic migraine without aura, not intractable, without status migrainosus: Secondary | ICD-10-CM

## 2022-08-11 MED ORDER — ONABOTULINUMTOXINA 200 UNITS IJ SOLR
155.0000 [IU] | Freq: Once | INTRAMUSCULAR | Status: AC
  Administered 2022-08-11: 155 [IU] via INTRAMUSCULAR

## 2022-08-11 NOTE — Progress Notes (Signed)
Botox- 200 units x 1 vial Lot: [id number] Expiration: 10/2024 NDC: 8768-1157-26  Bacteriostatic 0.9% Sodium Chloride- 4 mL total Lot: [id number] Expiration: 11/25 NDC: 41638-453-64  Dx:G43.709  B/B Witnessed by Marcelina Morel

## 2022-08-11 NOTE — Progress Notes (Signed)
08/11/22 ALL: [name] returns for Botox. She is doing well. She reports migraines have been much better over the past 12 weeks. She was given Nurtec samples but has not needed them. She is feeling well and without concerns.   05/19/2022 ALL: [name] returns for Botox. She is doing fairly well. She reports migraines are usually very well managed for first 8 weeks after procedure but then return. She has had 25 migraine days in the past month. She is taking Excedrin daily for abortive therapy. She could not afford to continue Emgality. She has not inquired about patient assistance. 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 No samples were available so given 10 tabs of Nurtec.   02/23/2022 ALL: [name] returns for Botox. She called to report cost for Ajovy was too expensive. We switched her to [facility] but cost remained too expensive. She reports migraines are stable. We did not have samples, today. I advised she call insurance agents to discuss possible supplement plans that may help with coverage versus applying for patient assistance.   11/25/2021 ALL: [name] returns for Botox. She is doing well. Migraines seem to return about 2 weeks before Botox is due but she is able to abort with aspirin. She continues Ajovy every month.   09/02/2021 ALL: [name] returns for Botox. She continues Ajovy every 30 days. She reports headaches have been really bad over the past three weeks. She was seen in the ER yesterday for shob, abdominal pain, and decreased appetitive for the past 3 weeks. CT showed large hiatal hernia. Vitals and EKG normal. She is scheduled to see GI tomorrow. She request to continue with Botox procedure today to help settle down worsening headaches. No acute distress.   Medications tried and failed: Amovig (ineffective and weight gain) 08/2018 switched to Ajovy 07/2019 (works well), Cymbalta, Topamax (side effects), Depakote. Abortive: Rizatriptan (works well), Nurtec (works well), Imitrex  (ineffective), Ubrelvy (ineffective), Zofran, Zyprexa.   06/09/2021 ALL: [name] returns for Botox. She continues to do well. She continues Ajovy samples provided by Dr Lucia Gaskins. She reports that she has not had any migrainous headaches since last being seen. Discussed Botox protocol.   03/05/2021 ALL: She returns for Botox. Rizatriptan helps with abortive needs. She has not had a headache since last procedure. She requests masseter and orb oculi injections as they help with clenching and retro orbital eye pain.   11/27/2020 AA: Stable: botox has "changed my life". +5 each masseter, +5 each orb oculi.  08/27/2020 AA:  +a. Needs dry needling 90% decrease in migraine frequency and severity since starting botox she may get one migraine in the last 1-2 weeks as it is wearinf off. She clenches. She feels the botox wears off near the end..Ask her about Ajovy* in August 2022 if she would like to reconsider but she is doing fantastic on botox.   Consent Form Botulism Toxin Injection For Chronic Migraine   Reviewed orally with patient, additionally signature is on file:  Botulism toxin has been approved by the Federal drug administration for treatment of chronic migraine. Botulism toxin does not cure chronic migraine and it may not be effective in some patients.  The administration of botulism toxin is accomplished by injecting a small amount of toxin into the muscles of the neck and head. Dosage must be titrated for each individual. Any benefits resulting from botulism toxin tend to wear off after 3 months with a repeat injection required if benefit is to be maintained. Injections are usually  done every 3-4 months with maximum effect peak achieved by about 2 or 3 weeks. Botulism toxin is expensive and you should be sure of what costs you will incur resulting from the injection.  The side effects of botulism toxin use for chronic migraine may include:   -Transient, and usually mild, facial weakness with facial  injections  -Transient, and usually mild, head or neck weakness with head/neck injections  -Reduction or loss of forehead facial animation due to forehead muscle weakness  -Eyelid drooping  -Dry eye  -Pain at the site of injection or bruising at the site of injection  -Double vision  -Potential unknown long term risks   Contraindications: You should not have Botox if you are pregnant, nursing, allergic to albumin, have an infection, skin condition, or muscle weakness at the site of the injection, or have myasthenia gravis, Lambert-Eaton syndrome, or ALS.  It is also possible that as with any injection, there may be an allergic reaction or no effect from the medication. Reduced effectiveness after repeated injections is sometimes seen and rarely infection at the injection site may occur. All care will be taken to prevent these side effects. If therapy is given over a long time, atrophy and wasting in the muscle injected may occur. Occasionally the patient's become refractory to treatment because they develop antibodies to the toxin. In this event, therapy needs to be modified.  I have read the above information and consent to the administration of botulism toxin.    BOTOX PROCEDURE NOTE FOR MIGRAINE HEADACHE  Contraindications and precautions discussed with patient(above). Aseptic procedure was observed and patient tolerated procedure. Procedure performed by Shawnie Dapper, FNP-C.   The condition has existed for more than 6 months, and pt does not have a diagnosis of ALS, Myasthenia Gravis or Lambert-Eaton Syndrome.  Risks and benefits of injections discussed and pt agrees to proceed with the procedure.  Written consent obtained  These injections are medically necessary. Pt  receives good benefits from these injections. These injections do not cause sedations or hallucinations which the oral therapies may cause.   Description of procedure:  The patient was placed in a sitting position. The  standard protocol was used for Botox as follows, with 5 units of Botox injected at each site:  -Procerus muscle, midline injection  -Corrugator muscle, bilateral injection  -Frontalis muscle, bilateral injection, with 2 sites each side, medial injection was performed in the upper one third of the frontalis muscle, in the region vertical from the medial inferior edge of the superior orbital rim. The lateral injection was again in the upper one third of the forehead vertically above the lateral limbus of the cornea, 1.5 cm lateral to the medial injection site.  -Temporalis muscle injection, 4 sites, bilaterally. The first injection was 3 cm above the tragus of the ear, second injection site was 1.5 cm to 3 cm up from the first injection site in line with the tragus of the ear. The third injection site was 1.5-3 cm forward between the first 2 injection sites. The fourth injection site was 1.5 cm posterior to the second injection site. 5th site laterally in the temporalis  muscleat the level of the outer canthus.  -Occipitalis muscle injection, 3 sites, bilaterally. The first injection was done one half way between the occipital protuberance and the tip of the mastoid process behind the ear. The second injection site was done lateral and superior to the first, 1 fingerbreadth from the first injection. The third injection site was  1 fingerbreadth superiorly and medially from the first injection site.  -Cervical paraspinal muscle injection, 2 sites, bilaterally. The first injection site was 1 cm from the midline of the cervical spine, 3 cm inferior to the lower border of the occipital protuberance. The second injection site was 1.5 cm superiorly and laterally to the first injection site.  -Trapezius muscle injection was performed at 3 sites, bilaterally. The first injection site was in the upper trapezius muscle halfway between the inflection point of the neck, and the acromion. The second injection site was  one half way between the acromion and the first injection site. The third injection was done between the first injection site and the inflection point of the neck.    Will return for repeat injection in 3 months.   A total of 200 units of Botox was prepared, 155 units of Botox was injected as documented above, any Botox not injected was wasted. The patient tolerated the procedure well, there were no complications of the above procedure.

## 2022-08-12 DIAGNOSIS — H43813 Vitreous degeneration, bilateral: Secondary | ICD-10-CM | POA: Diagnosis not present

## 2022-08-15 ENCOUNTER — Other Ambulatory Visit: Payer: Self-pay | Admitting: Adult Health

## 2022-08-15 DIAGNOSIS — F411 Generalized anxiety disorder: Secondary | ICD-10-CM

## 2022-08-16 ENCOUNTER — Encounter: Payer: Self-pay | Admitting: Certified Registered Nurse Anesthetist

## 2022-08-17 ENCOUNTER — Encounter: Payer: Self-pay | Admitting: Gastroenterology

## 2022-08-17 ENCOUNTER — Ambulatory Visit (AMBULATORY_SURGERY_CENTER): Payer: PPO | Admitting: Gastroenterology

## 2022-08-17 VITALS — BP 120/73 | HR 77 | Temp 96.0°F | Resp 12 | Ht 60.0 in | Wt 117.0 lb

## 2022-08-17 DIAGNOSIS — F419 Anxiety disorder, unspecified: Secondary | ICD-10-CM | POA: Diagnosis not present

## 2022-08-17 DIAGNOSIS — K2210 Ulcer of esophagus without bleeding: Secondary | ICD-10-CM | POA: Diagnosis not present

## 2022-08-17 DIAGNOSIS — I10 Essential (primary) hypertension: Secondary | ICD-10-CM | POA: Diagnosis not present

## 2022-08-17 DIAGNOSIS — F32A Depression, unspecified: Secondary | ICD-10-CM | POA: Diagnosis not present

## 2022-08-17 DIAGNOSIS — E7800 Pure hypercholesterolemia, unspecified: Secondary | ICD-10-CM | POA: Diagnosis not present

## 2022-08-17 MED ORDER — SODIUM CHLORIDE 0.9 % IV SOLN
500.0000 mL | Freq: Once | INTRAVENOUS | Status: DC
Start: 2022-08-17 — End: 2023-02-19

## 2022-08-17 MED ORDER — METOCLOPRAMIDE HCL 5 MG PO TABS: 5.0000 mg | ORAL_TABLET | Freq: Three times a day (TID) | ORAL | 1 refills | Status: DC | PRN

## 2022-08-17 NOTE — Op Note (Signed)
[facility] Patient Name: [name] Procedure Date: 08/17/2022 3:52 PM MRN: [id number] Endoscopist: Viviann Spare P. Adela Lank , MD, [id number] Age: 72 Referring MD:  Date of Birth: 12-Apr-1951 Gender: Female Account #: [id number] Procedure:                Upper GI endoscopy Indications:              Follow-up of erosive LA Grade D esophagitis noted                            in February in the setting of suspected                            gastroparesis. Was on Reglan and carafate with                            ongoing symptoms, now on protonix  BID which                            has controlled her symptoms and clinically doing                            much better. She stopped Reglan (was going to                            transition to domperidone but she felt better and                            elected not to start it) Medicines:                Monitored Anesthesia Care Procedure:                Pre-Anesthesia Assessment:                           - Prior to the procedure, a History and Physical                            was performed, and patient medications and                            allergies were reviewed. The patient's tolerance of                            previous anesthesia was also reviewed. The risks                            and benefits of the procedure and the sedation                            options and risks were discussed with the patient.                            All questions were answered, and informed consent  was obtained. Prior Anticoagulants: The patient has                            taken no anticoagulant or antiplatelet agents. ASA                            Grade Assessment: II - A patient with mild systemic                            disease. After reviewing the risks and benefits,                            the patient was deemed in satisfactory condition to                            undergo  the procedure.                           After obtaining informed consent, the endoscope was                            passed under direct vision. Throughout the                            procedure, the patient's blood pressure, pulse, and                            oxygen saturations were monitored continuously. The                            GIF [id number] #[id number] was introduced through the                            mouth, with the intention of advancing to the                            duodenum. The scope was advanced to the gastric                            body before the procedure was aborted. Medications                            were given. The upper GI endoscopy was accomplished                            without difficulty. The patient tolerated the                            procedure well. Scope In: Scope Out: Findings:                 The Z-line was slightly irregular but did not meet  criteria for Barrett's.                           A small hiatal hernia was present (2-3cm).                           The exam of the esophagus was otherwise normal.                            Interval healing of erosive esophagitis.                           A large amount of food was found in the gastric                            fundus, in the gastric body and in the gastric                            antrum. This prohibited views of the stomach and                            given aspiration risk, the procedure was aborted                            and scope withdrawn. The pylorus was evaluated on                            the last exam and patent. Complications:            No immediate complications. Estimated blood loss:                            None. Estimated Blood Loss:     Estimated blood loss: none. Impression:               - Z-line irregular but did not meet criteria for                            Barrett's.                           - Small  hiatal hernia.                           - Interval healing of erosive esophagitis                           - A large amount of food (residue) in the stomach -                            procedure aborted.                           Erosive esophagitis has healed and clinically  patient has significantly improved on PPI. Would                            continue protonix 40mg  BID for now, I suspect she                            will feel much worse off it.                           She has gastroparesis untreated currently. Will                            discuss options with her. At risk for aspiration at                            night with significant food retention. Avoid eating                            prior to bedtime and sleep with head of bed                            elevated. Can use Reglan PRN, although if                            asymptomatic can monitor given potential risks of                            long term treatment. Would not escalate to                            domperidone if symptomatic. Recommendation:           - Patient has a contact number available for                            emergencies. The signs and symptoms of potential                            delayed complications were discussed with the                            patient. Return to normal activities tomorrow.                            Written discharge instructions were provided to the                            patient.                           - Resume previous diet.                           - Continue present medications.                           -  Recommendations as above - Reglan PRN Viviann Spare P. Drayton Tieu, MD 08/17/2022 4:19:53 PM This report has been signed electronically.

## 2022-08-17 NOTE — Progress Notes (Signed)
1600 Robinul 0.1 mg IV given due large amount of secretions upon assessment.  MD made aware, vss

## 2022-08-17 NOTE — Patient Instructions (Addendum)
Impression/Recommendations:  Hiatal hernia and esophagitis handouts given to patient.  Continue Protonix 40 mg 2 times daily for now. Use Reglan as needed  Resume previous diet. Continue present medications.  YOU HAD AN ENDOSCOPIC PROCEDURE TODAY AT THE [facility]:   Refer to the procedure report that was given to you for any specific questions about what was found during the examination.  If the procedure report does not answer your questions, please call your gastroenterologist to clarify.  If you requested that your care partner not be given the details of your procedure findings, then the procedure report has been included in a sealed envelope for you to review at your convenience later.  YOU SHOULD EXPECT: Some feelings of bloating in the abdomen. Passage of more gas than usual.  Walking can help get rid of the air that was put into your GI tract during the procedure and reduce the bloating. If you had a lower endoscopy (such as a colonoscopy or flexible sigmoidoscopy) you may notice spotting of blood in your stool or on the toilet paper. If you underwent a bowel prep for your procedure, you may not have a normal bowel movement for a few days.  Please Note:  You might notice some irritation and congestion in your nose or some drainage.  This is from the oxygen used during your procedure.  There is no need for concern and it should clear up in a day or so.  SYMPTOMS TO REPORT IMMEDIATELY: Following upper endoscopy (EGD)  Vomiting of blood or coffee ground material  New chest pain or pain under the shoulder blades  Painful or persistently difficult swallowing  New shortness of breath  Fever of 100F or higher  Black, tarry-looking stools  For urgent or emergent issues, a gastroenterologist can be reached at any hour by calling [phone]. Do not use MyChart messaging for urgent concerns.    DIET:  We do recommend a small meal at first, but then you may proceed to  your regular diet.  Drink plenty of fluids but you should avoid alcoholic beverages for 24 hours.  ACTIVITY:  You should plan to take it easy for the rest of today and you should NOT DRIVE or use heavy machinery until tomorrow (because of the sedation medicines used during the test).    FOLLOW UP: Our staff will call the number listed on your records the next business day following your procedure.  We will call around 7:15- 8:00 am to check on you and address any questions or concerns that you may have regarding the information given to you following your procedure. If we do not reach you, we will leave a message.     If any biopsies were taken you will be contacted by phone or by letter within the next 1-3 weeks.  Please call us at [phone] if you have not heard about the biopsies in 3 weeks.    SIGNATURES/CONFIDENTIALITY: You and/or your care partner have signed paperwork which will be entered into your electronic medical record.  These signatures attest to the fact that that the information above on your After Visit Summary has been reviewed and is understood.  Full responsibility of the confidentiality of this discharge information lies with you and/or your care-partner.

## 2022-08-17 NOTE — Progress Notes (Signed)
[facility] History and Physical   Primary Care Physician:  Doreene Nest, NP   Reason for Procedure:   Follow up erosive esophagitis - rule out BE  Plan:    EGD     HPI: [name] is a 72 y.o. female  here for EGD to follow up history of severe esophagitis. See office note 07/15/22 for details. Prior EGD in February showed severe esophagitis, now on protonix BID and doing much better. Stopped Reglan and carafate.  EGD to confirm mucosal healing.      Otherwise feels well without any cardiopulmonary symptoms.   I have discussed risks / benefits of anesthesia and endoscopic procedure with [name] and they wish to proceed with the exams as outlined today.    Past Medical History:  Diagnosis Date   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   Anxiety    Asthma    as a child   CAP (community acquired pneumonia)    Cataract 2018   removed both eyes   COLONIC POLYPS, HX OF 11/15/2009   DEPRESSION 11/14/2009   GASTROJEJ ULCR UNS ACUT/CHRN W/O HEMOR PERF/OBST 11/06/2009   GERD (gastroesophageal reflux disease)    not on meds   Headache(784.0) 11/15/2009   Heart murmur    never bothered pt. always been told this   HEART MURMUR, HX OF 11/15/2009   HIATAL HERNIA 10/08/2009   Large   HIP FRACTURE, LEFT 11/14/2009   History of hiatal hernia    HYPERLIPIDEMIA 11/14/2009   HYPERTENSION 11/14/2009   OSTEOPOROSIS 11/14/2009   Prolonged Q-T interval on ECG 09/02/2021   WEIGHT GAIN 01/10/2010    Past Surgical History:  Procedure Laterality Date   APPENDECTOMY     AUGMENTATION MAMMAPLASTY Bilateral    BACK SURGERY  2003   Bleeding ulcers  10/2009   Broken Hip Left 06/2001   CATARACT EXTRACTION Bilateral 01/2017   COLONOSCOPY N/A 12/30/2016   Procedure: COLONOSCOPY WITH PROPOFOL;  Surgeon: Romie Levee, MD;  Location: [facility];  Service: Endoscopy;  Laterality: N/A;   COLONOSCOPY  2011   inadequate prep, polyps   GASTROSTOMY N/A 09/15/2021    Procedure: INSERTION OF GASTROSTOMY TUBE;  Surgeon: Berna Bue, MD;  Location: [facility];  Service: General;  Laterality: N/A;   NECK SURGERY  2004 & 2006   x's 2   POLYPECTOMY     RECTAL PROLAPSE REPAIR  01/2017   XI ROBOTIC ASSISTED PARAESOPHAGEAL HERNIA REPAIR N/A 09/15/2021   Procedure: XI ROBOTIC ASSISTED PARAESOPHAGEAL HERNIA REPAIR WITH GASTROPEXY;  Surgeon: Berna Bue, MD;  Location: [facility];  Service: General;  Laterality: N/A;    Prior to Admission medications   Medication Sig Start Date End Date Taking? Authorizing Provider  amitriptyline (ELAVIL) 50 MG tablet Take 1 tablet (50 mg total) by mouth at bedtime. For sleep 06/23/22  Yes Doreene Nest, NP  CALCIUM PO Take 1 tablet by mouth daily.    Yes [provider]  lisinopril (ZESTRIL) 20 MG tablet TAKE 1 TABLET BY MOUTH EVERY DAY FOR BLOOD PRESSURE 04/17/22  Yes Doreene Nest, NP  Multiple Vitamins-Minerals (MULTIVITAMIN PO) Take 1 tablet by mouth daily.    Yes [provider]  pantoprazole (PROTONIX) 40 MG tablet TAKE 1 TABLET BY MOUTH TWICE A DAY 06/05/22  Yes Unk Lightning, PA  rosuvastatin (CRESTOR) 5 MG tablet TAKE 1 TABLET BY MOUTH EVERY DAY FOR CHOLESTEROL 06/08/22  Yes Doreene Nest, NP  sertraline (ZOLOFT) 100 MG tablet Take two  tablets daily. 06/08/22  Yes Mozingo, Thereasa Solo, NP  ibandronate (BONIVA) 150 MG tablet TAKE 1 TABLET BY MOUTH EVERY 30 DAYS FOR BONE DENSITY. 05/25/22   Doreene Nest, NP    Current Outpatient Medications  Medication Sig Dispense Refill   amitriptyline (ELAVIL) 50 MG tablet Take 1 tablet (50 mg total) by mouth at bedtime. For sleep 90 tablet 2   CALCIUM PO Take 1 tablet by mouth daily.      lisinopril (ZESTRIL) 20 MG tablet TAKE 1 TABLET BY MOUTH EVERY DAY FOR BLOOD PRESSURE 90 tablet 3   Multiple Vitamins-Minerals (MULTIVITAMIN PO) Take 1 tablet by mouth daily.      pantoprazole (PROTONIX) 40 MG tablet TAKE 1 TABLET BY MOUTH TWICE A DAY  180 tablet 1   rosuvastatin (CRESTOR) 5 MG tablet TAKE 1 TABLET BY MOUTH EVERY DAY FOR CHOLESTEROL 90 tablet 2   sertraline (ZOLOFT) 100 MG tablet Take two tablets daily. 180 tablet 1   ibandronate (BONIVA) 150 MG tablet TAKE 1 TABLET BY MOUTH EVERY 30 DAYS FOR BONE DENSITY. 3 tablet 2   Current Facility-Administered Medications  Medication Dose Route Frequency Provider Last Rate Last Admin   0.9 %  sodium chloride infusion  500 mL Intravenous Once Teniya Filter, Willaim Rayas, MD        Allergies as of 08/17/2022   (No Known Allergies)    Family History  Problem Relation Age of Onset   Bone cancer Mother    Hypertension Other    Allergic rhinitis Neg Hx    Asthma Neg Hx    Urticaria Neg Hx    Colon cancer Neg Hx    Colon polyps Neg Hx    Esophageal cancer Neg Hx    Rectal cancer Neg Hx    Stomach cancer Neg Hx    Breast cancer Neg Hx     Social History   Socioeconomic History   Marital status: Married    Spouse name: Onalee Hua   Number of children: 3   Years of education: BA   Highest education level: Not on file  Occupational History   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 Spa  Tobacco Use   Smoking status: Former    Years: 15    Types: Cigarettes    Quit date: 05/04/1982    Years since quitting: 40.3   Smokeless tobacco: Never   Tobacco comments:    Married, 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[place], 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 (stress)  Vaping Use   Vaping Use: Never used  Substance and Sexual Activity   Alcohol use: Not Currently    Alcohol/week: 1.0 standard drink of alcohol    Types: 1 Glasses of wine per week   Drug use: No   Sexual activity: Not Currently    Birth control/protection: None  Other Topics Concern   Not on file  Social History Narrative   Lives with husband   Caffeine use: 1 cup or less    Right handed   Social Determinants of Health   Financial Resource Strain: Low Risk  (10/28/2021)   Overall Financial  Resource Strain (CARDIA)    Difficulty of Paying Living Expenses: Not hard at all  Food Insecurity: No Food Insecurity (10/28/2021)   Hunger Vital Sign    Worried About Running Out of Food in the Last Year: Never true    Ran Out of Food in the Last Year: Never true  Transportation Needs: No Transportation Needs (10/28/2021)  PRAPARE - Administrator, Civil Service (Medical): No    Lack of Transportation (Non-Medical): No  Physical Activity: Inactive (10/28/2021)   Exercise Vital Sign    Days of Exercise per Week: 0 days    Minutes of Exercise per Session: 0 min  Stress: No Stress Concern Present (10/28/2021)   [employer] of Occupational Health - Occupational Stress Questionnaire    Feeling of Stress : Not at all  Social Connections: Moderately Isolated (10/28/2021)   Social Connection and Isolation Panel [NHANES]    Frequency of Communication with Friends and Family: More than three times a week    Frequency of Social Gatherings with Friends and Family: More than three times a week    Attends Religious Services: Never    Database administrator or Organizations: No    Attends Banker Meetings: Never    Marital Status: Married  Catering manager Violence: Not At Risk (10/28/2021)   Humiliation, Afraid, Rape, and Kick questionnaire    Fear of Current or Ex-Partner: No    Emotionally Abused: No    Physically Abused: No    Sexually Abused: No    Review of Systems: All other review of systems negative except as mentioned in the HPI.  Physical Exam: Vital signs BP 120/70   Pulse 76   Temp (!) 96 F (35.6 C)   Ht 5' (1.524 m)   Wt 117 lb (53.1 kg)   SpO2 100%   BMI 22.85 kg/m   General:   Alert,  Well-developed, pleasant and cooperative in NAD Lungs:  Clear throughout to auscultation.   Heart:  Regular rate and rhythm Abdomen:  Soft, nontender and nondistended.   Neuro/Psych:  Alert and cooperative. Normal mood and affect. A and O x 3  Harlin Rain, MD [facility]

## 2022-08-17 NOTE — Progress Notes (Signed)
Report given to PACU, vss 

## 2022-08-18 ENCOUNTER — Telehealth: Payer: Self-pay | Admitting: *Deleted

## 2022-08-18 NOTE — Telephone Encounter (Signed)
Attempted f/u phone call. No answer. Left message. °

## 2022-09-16 ENCOUNTER — Other Ambulatory Visit: Payer: Self-pay | Admitting: Gastroenterology

## 2022-09-16 NOTE — Telephone Encounter (Signed)
MyChart message sent to pt

## 2022-11-03 ENCOUNTER — Telehealth: Payer: Self-pay

## 2022-11-03 NOTE — Telephone Encounter (Signed)
Unsuccessful attempt to reach patient on preferred number listed in notes for scheduled AWV. Left message on voicemail. 

## 2022-11-09 ENCOUNTER — Other Ambulatory Visit: Payer: Self-pay | Admitting: Primary Care

## 2022-11-09 DIAGNOSIS — Z1231 Encounter for screening mammogram for malignant neoplasm of breast: Secondary | ICD-10-CM

## 2022-11-09 NOTE — Progress Notes (Unsigned)
11/10/22 ALL: [name] returns for Botox. She reports headaches have been worsen, again. She has had nearly daily headaches for the past 12 weeks. She has been taking ibuprofen, Advil and Excedrin multiple times a day. She reports using the Nurtec samples and feels they were effective. She reports having dental work recently that could have contributed.   08/11/2022 ALL: [name] returns for Botox. She is doing well. She reports migraines have been much better over the past 12 weeks. She was given Nurtec samples but has not needed them. She is feeling well and without concerns.   05/19/2022 ALL: [name] returns for Botox. She is doing fairly well. She reports migraines are usually very well managed for first 8 weeks after procedure but then return. She has had 25 migraine days in the past month. She is taking Excedrin daily for abortive therapy. She could not afford to continue Emg[employer]. She has not inquired about patient assistance. 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 No samples were available so given 10 tabs of Nurtec.   02/23/2022 ALL: [name] returns for Botox. She called to report cost for Ajovy was too expensive. We switched her to [facility] but cost remained too expensive. She reports migraines are stable. We did not have samples, today. I advised she call insurance agents to discuss possible supplement plans that may help with coverage versus applying for patient assistance.   11/25/2021 ALL: [name] returns for Botox. She is doing well. Migraines seem to return about 2 weeks before Botox is due but she is able to abort with aspirin. She continues Ajovy every month.   09/02/2021 ALL: [name] returns for Botox. She continues Ajovy every 30 days. She reports headaches have been really bad over the past three weeks. She was seen in the ER yesterday for shob, abdominal pain, and decreased appetitive for the past 3 weeks. CT showed large hiatal hernia. Vitals and EKG normal. She is scheduled to  see GI tomorrow. She request to continue with Botox procedure today to help settle down worsening headaches. No acute distress.   Medications tried and failed: Amovig (ineffective and weight gain) 08/2018 switched to Ajovy 07/2019 (works well), Cymbalta, Topamax (side effects), Depakote. Abortive: Rizatriptan (works well), Nurtec (works well), Imitrex (ineffective), Ubrelvy (ineffective), Zofran, Zyprexa.   06/09/2021 ALL: [name] returns for Botox. She continues to do well. She continues Ajovy samples provided by Dr Lucia Gaskins. She reports that she has not had any migrainous headaches since last being seen. Discussed Botox protocol.   03/05/2021 ALL: She returns for Botox. Rizatriptan helps with abortive needs. She has not had a headache since last procedure. She requests masseter and orb oculi injections as they help with clenching and retro orbital eye pain.   11/27/2020 AA: Stable: botox has "changed my life". +5 each masseter, +5 each orb oculi.  08/27/2020 AA:  +a. Needs dry needling 90% decrease in migraine frequency and severity since starting botox she may get one migraine in the last 1-2 weeks as it is wearinf off. She clenches. She feels the botox wears off near the end..Ask her about Ajovy* in August 2022 if she would like to reconsider but she is doing fantastic on botox.   Consent Form Botulism Toxin Injection For Chronic Migraine   Reviewed orally with patient, additionally signature is on file:  Botulism toxin has been approved by the Federal drug administration for treatment of chronic migraine. Botulism toxin does not cure chronic migraine and it may not be effective in  some patients.  The administration of botulism toxin is accomplished by injecting a small amount of toxin into the muscles of the neck and head. Dosage must be titrated for each individual. Any benefits resulting from botulism toxin tend to wear off after 3 months with a repeat injection required if benefit is to be  maintained. Injections are usually done every 3-4 months with maximum effect peak achieved by about 2 or 3 weeks. Botulism toxin is expensive and you should be sure of what costs you will incur resulting from the injection.  The side effects of botulism toxin use for chronic migraine may include:   -Transient, and usually mild, facial weakness with facial injections  -Transient, and usually mild, head or neck weakness with head/neck injections  -Reduction or loss of forehead facial animation due to forehead muscle weakness  -Eyelid drooping  -Dry eye  -Pain at the site of injection or bruising at the site of injection  -Double vision  -Potential unknown long term risks   Contraindications: You should not have Botox if you are pregnant, nursing, allergic to albumin, have an infection, skin condition, or muscle weakness at the site of the injection, or have myasthenia gravis, Lambert-Eaton syndrome, or ALS.  It is also possible that as with any injection, there may be an allergic reaction or no effect from the medication. Reduced effectiveness after repeated injections is sometimes seen and rarely infection at the injection site may occur. All care will be taken to prevent these side effects. If therapy is given over a long time, atrophy and wasting in the muscle injected may occur. Occasionally the patient's become refractory to treatment because they develop antibodies to the toxin. In this event, therapy needs to be modified.  I have read the above information and consent to the administration of botulism toxin.    BOTOX PROCEDURE NOTE FOR MIGRAINE HEADACHE  Contraindications and precautions discussed with patient(above). Aseptic procedure was observed and patient tolerated procedure. Procedure performed by Shawnie Dapper, FNP-C.   The condition has existed for more than 6 months, and pt does not have a diagnosis of ALS, Myasthenia Gravis or Lambert-Eaton Syndrome.  Risks and benefits of  injections discussed and pt agrees to proceed with the procedure.  Written consent obtained  These injections are medically necessary. Pt  receives good benefits from these injections. These injections do not cause sedations or hallucinations which the oral therapies may cause.   Description of procedure:  The patient was placed in a sitting position. The standard protocol was used for Botox as follows, with 5 units of Botox injected at each site:  -Procerus muscle, midline injection  -Corrugator muscle, bilateral injection  -Frontalis muscle, bilateral injection, with 2 sites each side, medial injection was performed in the upper one third of the frontalis muscle, in the region vertical from the medial inferior edge of the superior orbital rim. The lateral injection was again in the upper one third of the forehead vertically above the lateral limbus of the cornea, 1.5 cm lateral to the medial injection site.  -Temporalis muscle injection, 4 sites, bilaterally. The first injection was 3 cm above the tragus of the ear, second injection site was 1.5 cm to 3 cm up from the first injection site in line with the tragus of the ear. The third injection site was 1.5-3 cm forward between the first 2 injection sites. The fourth injection site was 1.5 cm posterior to the second injection site. 5th site laterally in the temporalis  muscleat the level of the outer canthus.  -Occipitalis muscle injection, 3 sites, bilaterally. The first injection was done one half way between the occipital protuberance and the tip of the mastoid process behind the ear. The second injection site was done lateral and superior to the first, 1 fingerbreadth from the first injection. The third injection site was 1 fingerbreadth superiorly and medially from the first injection site.  -Cervical paraspinal muscle injection, 2 sites, bilaterally. The first injection site was 1 cm from the midline of the cervical spine, 3 cm inferior to  the lower border of the occipital protuberance. The second injection site was 1.5 cm superiorly and laterally to the first injection site.  -Trapezius muscle injection was performed at 3 sites, bilaterally. The first injection site was in the upper trapezius muscle halfway between the inflection point of the neck, and the acromion. The second injection site was one half way between the acromion and the first injection site. The third injection was done between the first injection site and the inflection point of the neck.    Will return for repeat injection in 3 months.   A total of 200 units of Botox was prepared, 155 units of Botox was injected as documented above, any Botox not injected was wasted. The patient tolerated the procedure well, there were no complications of the above procedure.

## 2022-11-10 ENCOUNTER — Encounter: Payer: Self-pay | Admitting: Family Medicine

## 2022-11-10 ENCOUNTER — Ambulatory Visit: Payer: PPO | Admitting: Family Medicine

## 2022-11-10 DIAGNOSIS — G43709 Chronic migraine without aura, not intractable, without status migrainosus: Secondary | ICD-10-CM

## 2022-11-10 MED ORDER — ONABOTULINUMTOXINA 100 UNITS IJ SOLR
155.00 [IU] | Freq: Once | INTRAMUSCULAR | Status: AC
Start: 2022-11-10 — End: 2022-11-10
  Administered 2022-11-10: 155 [IU] via INTRAMUSCULAR

## 2022-11-10 NOTE — Progress Notes (Signed)
Botox- 100 units x 2 vials Lot: [id number] Expiration: 12/2024 NDC: 4540-9811-91  Bacteriostatic 0.9% Sodium Chloride- 2 mL  Lot: [id number] Expiration: 03/04/2024 NDC: 62130865-78  Dx: I69.629  B/B Witnessed by Ted Mcalpine, CMA

## 2022-11-23 ENCOUNTER — Telehealth: Payer: Self-pay

## 2022-11-25 ENCOUNTER — Ambulatory Visit: Admission: RE | Admit: 2022-11-25 | Payer: PPO | Source: Ambulatory Visit

## 2022-11-25 DIAGNOSIS — Z1231 Encounter for screening mammogram for malignant neoplasm of breast: Secondary | ICD-10-CM | POA: Diagnosis not present

## 2022-12-02 ENCOUNTER — Encounter (INDEPENDENT_AMBULATORY_CARE_PROVIDER_SITE_OTHER): Payer: Self-pay

## 2022-12-15 ENCOUNTER — Ambulatory Visit (INDEPENDENT_AMBULATORY_CARE_PROVIDER_SITE_OTHER): Payer: PPO

## 2022-12-15 VITALS — Ht 60.0 in | Wt 113.0 lb

## 2022-12-15 DIAGNOSIS — Z0000 Encounter for general adult medical examination without abnormal findings: Secondary | ICD-10-CM

## 2022-12-15 NOTE — Patient Instructions (Signed)
[name] , Thank you for taking time to come for your Medicare Wellness Visit. I appreciate your ongoing commitment to your health goals. Please review the following plan we discussed and let me know if I can assist you in the future.   Referrals/Orders/Follow-Ups/Clinician Recommendations: Aim for 30 minutes of exercise or brisk walking, 6-8 glasses of water, and 5 servings of fruits and vegetables each day.   This is a list of the screening recommended for you and due dates:  Health Maintenance  Topic Date Due   COVID-19 Vaccine (6 - 2023-24 season) 01/02/2022   DTaP/Tdap/Td vaccine (2 - Td or Tdap) 08/30/2022   Medicare Annual Wellness Visit  10/29/2022   Flu Shot  12/03/2022   Colon Cancer Screening  03/07/2024   Mammogram  11/24/2024   Pneumonia Vaccine  Completed   DEXA scan (bone density measurement)  Completed   Hepatitis C Screening  Completed   Zoster (Shingles) Vaccine  Completed   HPV Vaccine  Aged Out    Advanced directives: (Copy Requested) Please bring a copy of your health care power of attorney and living will to the office to be added to your chart at your convenience.  Next Medicare Annual Wellness Visit scheduled for next year: Yes  Preventive Care 73 Years and Older, Female Preventive care refers to lifestyle choices and visits with your health care provider that can promote health and wellness. What does preventive care include? A yearly physical exam. This is also called an annual well check. Dental exams once or twice a year. Routine eye exams. Ask your health care provider how often you should have your eyes checked. Personal lifestyle choices, including: Daily care of your teeth and gums. Regular physical activity. Eating a healthy diet. Avoiding tobacco and drug use. Limiting alcohol use. Practicing safe sex. Taking low-dose aspirin every day. Taking vitamin and mineral supplements as recommended by your health care provider. What happens during  an annual well check? The services and screenings done by your health care provider during your annual well check will depend on your age, overall health, lifestyle risk factors, and family history of disease. Counseling  Your health care provider may ask you questions about your: Alcohol use. Tobacco use. Drug use. Emotional well-being. Home and relationship well-being. Sexual activity. Eating habits. History of falls. Memory and ability to understand (cognition). Work and work Astronomer. Reproductive health. Screening  You may have the following tests or measurements: Height, weight, and BMI. Blood pressure. Lipid and cholesterol levels. These may be checked every 5 years, or more frequently if you are over 85 years old. Skin check. Lung cancer screening. You may have this screening every year starting at age 73 if you have a 30-pack-year history of smoking and currently smoke or have quit within the past 15 years. Fecal occult blood test (FOBT) of the stool. You may have this test every year starting at age 73. Flexible sigmoidoscopy or colonoscopy. You may have a sigmoidoscopy every 5 years or a colonoscopy every 10 years starting at age 73. Hepatitis C blood test. Hepatitis B blood test. Sexually transmitted disease (STD) testing. Diabetes screening. This is done by checking your blood sugar (glucose) after you have not eaten for a while (fasting). You may have this done every 1-3 years. Bone density scan. This is done to screen for osteoporosis. You may have this done starting at age 73. Mammogram. This may be done every 1-2 years. Talk to your health care provider about how often  you should have regular mammograms. Talk with your health care provider about your test results, treatment options, and if necessary, the need for more tests. Vaccines  Your health care provider may recommend certain vaccines, such as: Influenza vaccine. This is recommended every year. Tetanus,  diphtheria, and acellular pertussis (Tdap, Td) vaccine. You may need a Td booster every 10 years. Zoster vaccine. You may need this after age 73. Pneumococcal 13-valent conjugate (PCV13) vaccine. One dose is recommended after age 73. Pneumococcal polysaccharide (PPSV23) vaccine. One dose is recommended after age 73. Talk to your health care provider about which screenings and vaccines you need and how often you need them. This information is not intended to replace advice given to you by your health care provider. Make sure you discuss any questions you have with your health care provider. Document Released: 05/17/2015 Document Revised: 01/08/2016 Document Reviewed: 02/19/2015 Elsevier Interactive Patient Education  2017 [employer].  Fall Prevention in the Home Falls can cause injuries. They can happen to people of all ages. There are many things you can do to make your home safe and to help prevent falls. What can I do on the outside of my home? Regularly fix the edges of walkways and driveways and fix any cracks. Remove anything that might make you trip as you walk through a door, such as a raised step or threshold. Trim any bushes or trees on the path to your home. Use bright outdoor lighting. Clear any walking paths of anything that might make someone trip, such as rocks or tools. Regularly check to see if handrails are loose or broken. Make sure that both sides of any steps have handrails. Any raised decks and porches should have guardrails on the edges. Have any leaves, snow, or ice cleared regularly. Use sand or salt on walking paths during winter. Clean up any spills in your garage right away. This includes oil or grease spills. What can I do in the bathroom? Use night lights. Install grab bars by the toilet and in the tub and shower. Do not use towel bars as grab bars. Use non-skid mats or decals in the tub or shower. If you need to sit down in the shower, use a plastic,  non-slip stool. Keep the floor dry. Clean up any water that spills on the floor as soon as it happens. Remove soap buildup in the tub or shower regularly. Attach bath mats securely with double-sided non-slip rug tape. Do not have throw rugs and other things on the floor that can make you trip. What can I do in the bedroom? Use night lights. Make sure that you have a light by your bed that is easy to reach. Do not use any sheets or blankets that are too big for your bed. They should not hang down onto the floor. Have a firm chair that has side arms. You can use this for support while you get dressed. Do not have throw rugs and other things on the floor that can make you trip. What can I do in the kitchen? Clean up any spills right away. Avoid walking on wet floors. Keep items that you use a lot in easy-to-reach places. If you need to reach something above you, use a strong step stool that has a grab bar. Keep electrical cords out of the way. Do not use floor polish or wax that makes floors slippery. If you must use wax, use non-skid floor wax. Do not have throw rugs and other things on  the floor that can make you trip. What can I do with my stairs? Do not leave any items on the stairs. Make sure that there are handrails on both sides of the stairs and use them. Fix handrails that are broken or loose. Make sure that handrails are as long as the stairways. Check any carpeting to make sure that it is firmly attached to the stairs. Fix any carpet that is loose or worn. Avoid having throw rugs at the top or bottom of the stairs. If you do have throw rugs, attach them to the floor with carpet tape. Make sure that you have a light switch at the top of the stairs and the bottom of the stairs. If you do not have them, ask someone to add them for you. What else can I do to help prevent falls? Wear shoes that: Do not have high heels. Have rubber bottoms. Are comfortable and fit you well. Are closed  at the toe. Do not wear sandals. If you use a stepladder: Make sure that it is fully opened. Do not climb a closed stepladder. Make sure that both sides of the stepladder are locked into place. Ask someone to hold it for you, if possible. Clearly mark and make sure that you can see: Any grab bars or handrails. First and last steps. Where the edge of each step is. Use tools that help you move around (mobility aids) if they are needed. These include: Canes. Walkers. Scooters. Crutches. Turn on the lights when you go into a dark area. Replace any light bulbs as soon as they burn out. Set up your furniture so you have a clear path. Avoid moving your furniture around. If any of your floors are uneven, fix them. If there are any pets around you, be aware of where they are. Review your medicines with your doctor. Some medicines can make you feel dizzy. This can increase your chance of falling. Ask your doctor what other things that you can do to help prevent falls. This information is not intended to replace advice given to you by your health care provider. Make sure you discuss any questions you have with your health care provider. Document Released: 02/14/2009 Document Revised: 09/26/2015 Document Reviewed: 05/25/2014 Elsevier Interactive Patient Education  2017 [employer].

## 2022-12-15 NOTE — Progress Notes (Addendum)
Subjective:   [name] is a 72 y.o. female who presents for Medicare Annual (Subsequent) preventive examination.  Visit Complete: Virtual  I connected with  Lesleigh Noe on 12/15/22 by a audio enabled telemedicine application and verified that I am speaking with the correct person using two identifiers.  Patient Location: Home  Provider Location: Home Office  I discussed the limitations of evaluation and management by telemedicine. The patient expressed understanding and agreed to proceed.  Vital Signs: Unable to obtain new vitals due to this being a telehealth visit. Pt reported WT and HT.  Review of Systems      Cardiac Risk Factors include: advanced age (>38men, >70 women);hypertension;dyslipidemia     Objective:    Today's Vitals   12/15/22 1438  Weight: 113 lb (51.3 kg)  Height: 5' (1.524 m)   Body mass index is 22.07 kg/m.     12/15/2022    2:46 PM 10/28/2021   12:48 PM 09/15/2021   12:20 PM 09/10/2021    1:44 PM 09/01/2021   11:42 AM 10/24/2020    4:02 PM 09/11/2020    3:34 PM  Advanced Directives  Does Patient Have a Medical Advance Directive? Yes Yes Yes Yes Yes Yes Yes  Type of Estate agent of [place];Living will Healthcare Power of [place];Living will Healthcare Power of [employer] of [place];Living will  Healthcare Power of [place];Living will Healthcare Power of [place];Living will  Does patient want to make changes to medical advance directive?  No - Patient declined No - Patient declined No - Patient declined   No - Patient declined  Copy of Healthcare Power of Attorney in Chart? No - copy requested No - copy requested No - copy requested No - copy requested  No - copy requested No - copy requested    Current Medications (verified) Outpatient Encounter Medications as of 12/15/2022  Medication Sig   amitriptyline (ELAVIL) 50 MG tablet Take 1 tablet (50 mg total) by mouth at bedtime. For sleep    CALCIUM PO Take 1 tablet by mouth daily.    ibandronate (BONIVA) 150 MG tablet TAKE 1 TABLET BY MOUTH EVERY 30 DAYS FOR BONE DENSITY.   lisinopril (ZESTRIL) 20 MG tablet TAKE 1 TABLET BY MOUTH EVERY DAY FOR BLOOD PRESSURE   Multiple Vitamins-Minerals (MULTIVITAMIN PO) Take 1 tablet by mouth daily.    pantoprazole (PROTONIX) 40 MG tablet TAKE 1 TABLET BY MOUTH TWICE A DAY   rosuvastatin (CRESTOR) 5 MG tablet TAKE 1 TABLET BY MOUTH EVERY DAY FOR CHOLESTEROL   sertraline (ZOLOFT) 100 MG tablet Take two tablets daily.   Facility-Administered Encounter Medications as of 12/15/2022  Medication   0.9 %  sodium chloride infusion    Allergies (verified) Patient has no known allergies.   History: Past Medical History:  Diagnosis Date   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   Anxiety    Asthma    as a child   CAP (community acquired pneumonia)    Cataract 2018   removed both eyes   COLONIC POLYPS, HX OF 11/15/2009   DEPRESSION 11/14/2009   GASTROJEJ ULCR UNS ACUT/CHRN W/O HEMOR PERF/OBST 11/06/2009   GERD (gastroesophageal reflux disease)    not on meds   Headache(784.0) 11/15/2009   Heart murmur    never bothered pt. always been told this   HEART MURMUR, HX OF 11/15/2009   HIATAL HERNIA 10/08/2009   Large   HIP FRACTURE, LEFT 11/14/2009   History of hiatal hernia  HYPERLIPIDEMIA 11/14/2009   HYPERTENSION 11/14/2009   OSTEOPOROSIS 11/14/2009   Prolonged Q-T interval on ECG 09/02/2021   WEIGHT GAIN 01/10/2010   Past Surgical History:  Procedure Laterality Date   APPENDECTOMY     AUGMENTATION MAMMAPLASTY Bilateral    BACK SURGERY  2003   Bleeding ulcers  10/2009   Broken Hip Left 06/2001   CATARACT EXTRACTION Bilateral 01/2017   COLONOSCOPY N/A 12/30/2016   Procedure: COLONOSCOPY WITH PROPOFOL;  Surgeon: Romie Levee, MD;  Location: [facility];  Service: Endoscopy;  Laterality: N/A;   COLONOSCOPY  2011   inadequate prep, polyps   GASTROSTOMY N/A 09/15/2021   Procedure:  INSERTION OF GASTROSTOMY TUBE;  Surgeon: Berna Bue, MD;  Location: [facility];  Service: General;  Laterality: N/A;   NECK SURGERY  2004 & 2006   x's 2   POLYPECTOMY     RECTAL PROLAPSE REPAIR  01/2017   XI ROBOTIC ASSISTED PARAESOPHAGEAL HERNIA REPAIR N/A 09/15/2021   Procedure: XI ROBOTIC ASSISTED PARAESOPHAGEAL HERNIA REPAIR WITH GASTROPEXY;  Surgeon: Berna Bue, MD;  Location: [facility];  Service: General;  Laterality: N/A;   Family History  Problem Relation Age of Onset   Bone cancer Mother    Hypertension Other    Allergic rhinitis Neg Hx    Asthma Neg Hx    Urticaria Neg Hx    Colon cancer Neg Hx    Colon polyps Neg Hx    Esophageal cancer Neg Hx    Rectal cancer Neg Hx    Stomach cancer Neg Hx    Breast cancer Neg Hx    Social History   Socioeconomic History   Marital status: Married    Spouse name: Onalee Hua   Number of children: 3   Years of education: BA   Highest education level: Not on file  Occupational History   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 Spa  Tobacco Use   Smoking status: Former    Current packs/day: 0.00    Types: Cigarettes    Start date: 05/05/1967    Quit date: 05/04/1982    Years since quitting: 40.6   Smokeless tobacco: Never   Tobacco comments:    Married, 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[place], 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 (stress)  Vaping Use   Vaping status: Never Used  Substance and Sexual Activity   Alcohol use: Not Currently    Alcohol/week: 1.0 standard drink of alcohol    Types: 1 Glasses of wine per week   Drug use: No   Sexual activity: Not Currently    Birth control/protection: None  Other Topics Concern   Not on file  Social History Narrative   Lives with husband   Caffeine use: 1 cup or less    Right handed   Social Determinants of Health   Financial Resource Strain: Low Risk  (12/15/2022)   Overall Financial Resource Strain (CARDIA)    Difficulty of Paying Living Expenses:  Not hard at all  Food Insecurity: No Food Insecurity (12/15/2022)   Hunger Vital Sign    Worried About Running Out of Food in the Last Year: Never true    Ran Out of Food in the Last Year: Never true  Transportation Needs: No Transportation Needs (12/15/2022)   PRAPARE - Administrator, Civil Service (Medical): No    Lack of Transportation (Non-Medical): No  Physical Activity: Sufficiently Active (12/15/2022)   Exercise Vital Sign    Days of  Exercise per Week: 5 days    Minutes of Exercise per Session: 70 min  Stress: No Stress Concern Present (12/15/2022)   [employer] of Occupational Health - Occupational Stress Questionnaire    Feeling of Stress : Not at all  Social Connections: Moderately Isolated (12/15/2022)   Social Connection and Isolation Panel [NHANES]    Frequency of Communication with Friends and Family: More than three times a week    Frequency of Social Gatherings with Friends and Family: More than three times a week    Attends Religious Services: Never    Database administrator or Organizations: No    Attends Engineer, structural: Never    Marital Status: Married    Tobacco Counseling Counseling given: Not Answered Tobacco comments: Married, 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 LPN   Activities of Daily Living    12/15/2022    2:47 PM 04/07/2022   11:38 AM  In your present state of health, do you have any difficulty performing the following  activities:  Hearing? 0 0  Vision? 0 0  Difficulty concentrating or making decisions? 0 0  Walking or climbing stairs? 0 0  Dressing or bathing? 0 0  Doing errands, shopping? 0 0  Preparing Food and eating ? N   Using the Toilet? N   In the past six months, have you accidently leaked urine? N   Do you have problems with loss of bowel control? N   Managing your Medications? N   Managing your Finances? N   Housekeeping or managing your Housekeeping? N     Patient Care Team: Doreene Nest, NP as PCP - General (Internal Medicine) Anson Fret, MD as Consulting Physician (Neurology) Jonathon Bellows, NP as Nurse Practitioner (Psychology) Merryl Hacker, NP as Nurse Practitioner (Nurse Practitioner) [facility] [place]  Indicate any recent Medical Services you may have received from other than [facility] providers in the past year (date may be approximate).     Assessment:   This is a routine wellness examination for [place].  Hearing/Vision screen Hearing Screening - Comments:: Denies hearing difficulties   Vision Screening - Comments:: Had lasix - Dr.Oman - UTD on exams  Dietary issues and exercise activities discussed:     Goals Addressed             This Visit's Progress    Patient Stated       Stay active and continue to make smart food choices.       Depression Screen    12/15/2022    2:41 PM 06/12/2022    2:32 PM 04/07/2022   11:38 AM 10/28/2021   12:43 PM 10/24/2020    4:03 PM 03/26/2020   10:58 AM 03/16/2019    2:16 PM  PHQ 2/9 Scores  PHQ - 2 Score 0 0 0 0 0 0 0  PHQ- 9 Score   9  0 0 0    Fall Risk    12/15/2022  2:47 PM 06/12/2022    2:32 PM 04/07/2022   11:38 AM 10/28/2021   12:47 PM 10/24/2020    4:02 PM  Fall Risk   Falls in the past year? 0 0 0 0 0  Number falls in past yr: 0 0 0 0 0  Injury with Fall? 0 0 0 0 0  Risk for fall due to : No Fall Risks No Fall Risks  No Fall Risks Medication side effect  Follow up Falls  prevention discussed;Falls evaluation completed Falls evaluation completed   Falls evaluation completed;Falls prevention discussed    MEDICARE RISK AT HOME:  Medicare Risk at Home - 12/15/22 1447     Any stairs in or around the home? Yes    If so, are there any without handrails? No    Home free of loose throw rugs in walkways, pet beds, electrical cords, etc? Yes    Adequate lighting in your home to reduce risk of falls? Yes    Life alert? No    Use of a cane, walker or w/c? No    Grab bars in the bathroom? No    Shower chair or bench in shower? Yes    Elevated toilet seat or a handicapped toilet? Yes             TIMED UP AND GO:  Was the test performed?  No    Cognitive Function:    10/24/2020    4:04 PM 03/16/2019    2:17 PM  MMSE - Mini Mental State Exam  Not completed: Refused   Orientation to time  5  Orientation to Place  5  Registration  3  Attention/ Calculation  5  Recall  3  Language- repeat  1        12/15/2022    2:48 PM 10/28/2021   12:48 PM  6CIT Screen  What Year? 0 points 0 points  What month? 0 points 0 points  What time? 0 points 0 points  Count back from 20 0 points 0 points  Months in reverse 0 points 0 points  Repeat phrase 0 points 0 points  Total Score 0 points 0 points    Immunizations Immunization History  Administered Date(s) Administered   Fluad Quad(high Dose 65+) 01/05/2022   Influenza, High Dose Seasonal PF 01/13/2017, 01/28/2021   Influenza,inj,Quad PF,6+ Mos 12/07/2018   Influenza-Unspecified 02/03/2018, 01/01/2019, 02/07/2020   PFIZER(Purple Top)SARS-COV-2 Vaccination 05/29/2019, 06/19/2019, 01/27/2020, 08/20/2020   PNEUMOCOCCAL CONJUGATE-20 01/15/2021   Pfizer Covid-19 Vaccine Bivalent Booster 19yrs & up 01/16/2021   Pneumococcal Conjugate-13 12/20/2015   Pneumococcal Polysaccharide-23 01/02/2017   Tdap 08/29/2012   Zoster Recombinant(Shingrix) 11/19/2016, 04/18/2017   Zoster, Live 02/28/2013    TDAP status: Due,  Education has been provided regarding the importance of this vaccine. Advised may receive this vaccine at local pharmacy or Health Dept. Aware to provide a copy of the vaccination record if obtained from local pharmacy or Health Dept. Verbalized acceptance and understanding.  Flu Vaccine status: Due, Education has been provided regarding the importance of this vaccine. Advised may receive this vaccine at local pharmacy or Health Dept. Aware to provide a copy of the vaccination record if obtained from local pharmacy or Health Dept. Verbalized acceptance and understanding.  Pneumococcal vaccine status: Up to date  Covid-19 vaccine status: Information provided on how to obtain vaccines.   Qualifies for Shingles Vaccine? Yes   Zostavax completed Yes   Shingrix Completed?: Yes  Screening Tests Health Maintenance  Topic Date Due  COVID-19 Vaccine (6 - 2023-24 season) 01/02/2022   DTaP/Tdap/Td (2 - Td or Tdap) 08/30/2022   INFLUENZA VACCINE  12/03/2022   Medicare Annual Wellness (AWV)  12/15/2023   Colonoscopy  03/07/2024   MAMMOGRAM  11/24/2024   Pneumonia Vaccine 63+ Years old  Completed   DEXA SCAN  Completed   Hepatitis C Screening  Completed   Zoster Vaccines- Shingrix  Completed   HPV VACCINES  Aged Out    Health Maintenance  Health Maintenance Due  Topic Date Due   COVID-19 Vaccine (6 - 2023-24 season) 01/02/2022   DTaP/Tdap/Td (2 - Td or Tdap) 08/30/2022   INFLUENZA VACCINE  12/03/2022    Colorectal cancer screening: Type of screening: Colonoscopy. Completed 03/07/21. Repeat every 3 years  Mammogram status: Completed 11/25/22. Repeat every year  Bone Density status: Completed 08/18/21. Results reflect: Bone density results: OSTEOPENIA. Repeat every 2 years.  Lung Cancer Screening: (Low Dose CT Chest recommended if Age 7-80 years, 20 pack-year currently smoking OR have quit w/in 15years.) does not qualify.   Lung Cancer Screening Referral: no  Additional  Screening:  Hepatitis C Screening: does qualify; Completed 03/17/19  Vision Screening: Recommended annual ophthalmology exams for early detection of glaucoma and other disorders of the eye. Is the patient up to date with their annual eye exam?  Yes  Who is the provider or what is the name of the office in which the patient attends annual eye exams? Dr.Oman If pt is not established with a provider, would they like to be referred to a provider to establish care? Yes .   Dental Screening: Recommended annual dental exams for proper oral hygiene   Community Resource Referral / Chronic Care Management: CRR required this visit?  No   CCM required this visit?  No     Plan:     I have personally reviewed and noted the following in the patient's chart:   Medical and social history Use of alcohol, tobacco or illicit drugs  Current medications and supplements including opioid prescriptions. Patient is not currently taking opioid prescriptions. Functional ability and status Nutritional status Physical activity Advanced directives List of other physicians Hospitalizations, surgeries, and ER visits in previous 12 months Vitals Screenings to include cognitive, depression, and falls Referrals and appointments  In addition, I have reviewed and discussed with patient certain preventive protocols, quality metrics, and best practice recommendations. A written personalized care plan for preventive services as well as general preventive health recommendations were provided to patient.     Maryan Puls, LPN   1/61/0960   After Visit Summary: (MyChart) Due to this being a telephonic visit, the after visit summary with patients personalized plan was offered to patient via MyChart   Nurse Notes: none

## 2022-12-24 ENCOUNTER — Ambulatory Visit: Payer: PPO | Admitting: Adult Health

## 2022-12-24 ENCOUNTER — Encounter: Payer: Self-pay | Admitting: Adult Health

## 2022-12-24 DIAGNOSIS — F411 Generalized anxiety disorder: Secondary | ICD-10-CM | POA: Diagnosis not present

## 2022-12-24 MED ORDER — SERTRALINE HCL 100 MG PO TABS
ORAL_TABLET | ORAL | 1 refills | Status: DC
Start: 2022-12-24 — End: 2023-05-12

## 2022-12-24 NOTE — Progress Notes (Signed)
[name] [id number] 1950/07/17 72 y.o.  Subjective:   Patient ID:  [name] is a 72 y.o. (DOB Jul 27, 1950) female.  Chief Complaint: No chief complaint on file.   HPI [name] presents to the office today for follow-up of GAD.   Describes mood today as "ok". Pleasant. Denies tearfulness. Mood symptoms - denies depression, anxiety, and irritability. Denies panic attacks. Reports some worry, rumination, and over thinking. Denies obsessive thoughts or acts. Mood is consistent. Stating "I feel like I'm doing good". Feels like the Zoloft is working well for her. Stableinterest and motivation. Taking medications as prescribed.  Energy levels stable. Active, has a regular exercise routine 6 days a week. Enjoys some usual interests and activities. Married. Lives with husband. Has 3 children - grandchildren. Spending time with family. Appetite adequate. Weight loss - 112 pounds - 60". Sleeps well most nights. Averages 8 hours.  Focus and concentration stable. Completing tasks. Managing aspects of household. Retired. Denies SI or HI.  Denies AH or VH. Denies self harm. Denies substance use.   Previous medication trials: Trazadone, Amitriptyline, Wellbutrin, Gabapentin 900mg  daily, Prozac 80mg  daily, Cymbalta 40mg  daily, Hydroxyzine 75mg  daily, Clonidine 0.2mg  daily and Lamictal 100mg  daily, Viibyrd 40mg  daily, Clonazepam, Buspar   AUDIT    Flowsheet Row Clinical Support from 10/28/2021 in [facility] Clinical Support from 10[facility]24/2020 in [facility]  Alcohol Use Disorder Identification Test Final Score (AUDIT) 4 4      GAD-7    Flowsheet Row Office Visit from 06/24/2017 in [facility]  Total GAD-7 Score 10      PHQ2-9    Flowsheet Row Clinical Support from 12/15/2022 in [facility] Office Visit from 06/12/2022 in [facility] Office Visit from 04/07/2022 in [facility] [place] [facility] Clinical Support from 10/28/2021 in [facility] Clinical Support from 10/24/2020 in [facility]  PHQ-2 Total Score 0 0 0 0 0  PHQ-9 Total Score -- -- 9 -- 0      Flowsheet Row Admission (Discharged) from 09/15/2021 in [facility] Pre-Admission Testing 60 from 09/10/2021 in [facility] ED from 09/01/2021 in [facility] at [facility]  C-SSRS RISK CATEGORY No Risk No Risk No Risk        Review of Systems:  Review of Systems  Musculoskeletal:  Negative for gait problem.  Neurological:  Negative for tremors.  Psychiatric/Behavioral:         Please refer to HPI    Medications: I have reviewed the patient's current medications.  Current Outpatient Medications  Medication Sig Dispense Refill   amitriptyline (ELAVIL) 50 MG tablet Take 1 tablet (50 mg total) by mouth at bedtime. For sleep 90 tablet 2   CALCIUM PO Take 1 tablet by mouth daily.      ibandronate (BONIVA) 150 MG tablet TAKE 1 TABLET BY MOUTH EVERY 30 DAYS FOR BONE DENSITY. 3 tablet 2   lisinopril (ZESTRIL) 20 MG tablet TAKE 1 TABLET BY MOUTH EVERY DAY FOR BLOOD PRESSURE 90 tablet 3   Multiple Vitamins-Minerals (MULTIVITAMIN PO) Take 1 tablet by mouth daily.      pantoprazole (PROTONIX) 40 MG tablet TAKE 1 TABLET BY MOUTH TWICE A DAY 180 tablet 1   rosuvastatin (CRESTOR) 5  MG tablet TAKE 1 TABLET BY MOUTH EVERY DAY FOR CHOLESTEROL 90 tablet 2   sertraline (ZOLOFT) 100 MG tablet Take two tablets daily. 180 tablet 1   Current Facility-Administered Medications  Medication Dose Route Frequency Provider Last Rate Last Admin   0.9 %  sodium chloride infusion  500 mL Intravenous Once Armbruster, Willaim Rayas, MD        Medication Side Effects: None  Allergies: No Known  Allergies  Past Medical History:  Diagnosis Date   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   Anxiety    Asthma    as a child   CAP (community acquired pneumonia)    Cataract 2018   removed both eyes   COLONIC POLYPS, HX OF 11/15/2009   DEPRESSION 11/14/2009   GASTROJEJ ULCR UNS ACUT/CHRN W/O HEMOR PERF/OBST 11/06/2009   GERD (gastroesophageal reflux disease)    not on meds   Headache(784.0) 11/15/2009   Heart murmur    never bothered pt. always been told this   HEART MURMUR, HX OF 11/15/2009   HIATAL HERNIA 10/08/2009   Large   HIP FRACTURE, LEFT 11/14/2009   History of hiatal hernia    HYPERLIPIDEMIA 11/14/2009   HYPERTENSION 11/14/2009   OSTEOPOROSIS 11/14/2009   Prolonged Q-T interval on ECG 09/02/2021   WEIGHT GAIN 01/10/2010    Past Medical History, Surgical history, Social history, and Family history were reviewed and updated as appropriate.   Please see review of systems for further details on the patient's review from today.   Objective:   Physical Exam:  There were no vitals taken for this visit.  Physical Exam Constitutional:      General: She is not in acute distress. Musculoskeletal:        General: No deformity.  Neurological:     Mental Status: She is alert and oriented to person, place, and time.     Coordination: Coordination normal.  Psychiatric:        Attention and Perception: Attention and perception normal. She does not perceive auditory or visual hallucinations.        Mood and Affect: Affect is not labile, blunt, angry or inappropriate.        Speech: Speech normal.        Behavior: Behavior normal.        Thought Content: Thought content normal. Thought content is not paranoid or delusional. Thought content does not include homicidal or suicidal ideation. Thought content does not include homicidal or suicidal plan.        Cognition and Memory: Cognition and memory normal.        Judgment: Judgment normal.     Comments: Insight intact      Lab Review:     Component Value Date/Time   NA 136 06/02/2022 1210   K 3.7 06/02/2022 1210   CL 96 06/02/2022 1210   CO2 27 06/02/2022 1210   GLUCOSE 98 06/02/2022 1210   BUN 24 (H) 06/02/2022 1210   CREATININE 0.91 06/02/2022 1210   CREATININE 1.01 (H) 02/25/2018 1448   CALCIUM 11.3 (H) 06/02/2022 1210   PROT 7.3 06/02/2022 1210   ALBUMIN 4.4 06/02/2022 1210   AST 19 06/02/2022 1210   ALT 11 06/02/2022 1210   ALKPHOS 57 06/02/2022 1210   BILITOT 0.4 06/02/2022 1210   GFRNONAA >60 09/16/2021 0438   GFRAA >60 01/02/2017 0515       Component Value Date/Time   WBC 9.4 06/02/2022 1210   RBC 4.16 06/02/2022 1210   HGB 13.0 06/02/2022  1210   HCT 37.7 06/02/2022 1210   PLT 294.0 06/02/2022 1210   MCV 90.7 06/02/2022 1210   MCH 32.3 09/16/2021 0438   MCHC 34.3 06/02/2022 1210   RDW 13.2 06/02/2022 1210   LYMPHSABS 2.8 06/02/2022 1210   MONOABS 0.7 06/02/2022 1210   EOSABS 0.2 06/02/2022 1210   BASOSABS 0.1 06/02/2022 1210    No results found for: "POCLITH", "LITHIUM"   No results found for: "PHENYTOIN", "PHENOBARB", "VALPROATE", "CBMZ"   .res Assessment: Plan:    Plan:  PDMP reviewed  Continue:  Zoloft 200mg  daily  Also taking Elavil 50mg  at bedtime.  RTC 6 months  Patient advised to contact office with any questions, adverse effects, or acute worsening in signs and symptoms.  Time spent with patient was 20 minutes. Greater than 50% of face to face time with patient was spent on counseling and coordination of care.    There are no diagnoses linked to this encounter.   Please see After Visit Summary for patient specific instructions.  Future Appointments  Date Time Provider Department Center  12/24/2022  2:00 PM Bush Murdoch, Thereasa Solo, NP [facility] None  02/03/2023  3:30 PM Lomax, Amy, NP [facility] None  12/22/2023 11:15 AM [facility] ANNUAL WELLNESS VISIT 1 [facility]    No orders of the defined types were placed in this  encounter.   -------------------------------

## 2022-12-28 DIAGNOSIS — L814 Other melanin hyperpigmentation: Secondary | ICD-10-CM | POA: Diagnosis not present

## 2022-12-28 DIAGNOSIS — Z85828 Personal history of other malignant neoplasm of skin: Secondary | ICD-10-CM | POA: Diagnosis not present

## 2022-12-28 DIAGNOSIS — L570 Actinic keratosis: Secondary | ICD-10-CM | POA: Diagnosis not present

## 2022-12-28 DIAGNOSIS — L821 Other seborrheic keratosis: Secondary | ICD-10-CM | POA: Diagnosis not present

## 2022-12-31 ENCOUNTER — Telehealth: Payer: Self-pay | Admitting: Neurology

## 2022-12-31 NOTE — Telephone Encounter (Signed)
Pt states the nurtec samples were very helpful, she would now like a Rx called into  [id number] [place]

## 2023-01-05 MED ORDER — NURTEC 75 MG PO TBDP: 75.0000 mg | ORAL_TABLET | Freq: Every day | ORAL | 11 refills | Status: DC | PRN

## 2023-01-05 NOTE — Addendum Note (Signed)
Addended by: Shawnie Dapper L on: 01/05/2023 07:16 PM   Modules accepted: Orders

## 2023-01-18 ENCOUNTER — Other Ambulatory Visit (HOSPITAL_COMMUNITY): Payer: Self-pay

## 2023-01-18 ENCOUNTER — Telehealth: Payer: Self-pay

## 2023-01-18 NOTE — Telephone Encounter (Signed)
Pharmacy Patient Advocate Encounter   Received notification from CoverMyMeds that prior authorization for Nurtec 75MG  dispersible tablets is required/requested.   Insurance verification completed.   The patient is insured through [employer]/ [employer] Advance .   Per test claim: PA required; PA submitted to [employer]/ [employer] via CoverMyMeds Key/confirmation #/EOC [id number] Status is pending

## 2023-01-18 NOTE — Telephone Encounter (Signed)
Pharmacy Patient Advocate Encounter  Received notification from [employer]/ Rx Advance that Prior Authorization for Nurtec 75MG  dispersible tablets has been APPROVED from 01/18/2023 to 04/19/2023. Ran test claim, Copay is $316.18 per 30DS. This test claim was processed through [facility]- copay amounts may vary at other pharmacies due to pharmacy/plan contracts, or as the patient moves through the different stages of their insurance plan.   PA #/Case ID/Reference #: PA Case ID #: [id number]

## 2023-01-20 ENCOUNTER — Other Ambulatory Visit: Payer: Self-pay | Admitting: Primary Care

## 2023-01-20 ENCOUNTER — Other Ambulatory Visit: Payer: Self-pay | Admitting: Physician Assistant

## 2023-01-20 DIAGNOSIS — E785 Hyperlipidemia, unspecified: Secondary | ICD-10-CM

## 2023-01-20 NOTE — Telephone Encounter (Signed)
Patient is due for CPE/follow up in early December (after 04/08/23), this will be required prior to any further refills.  Please schedule, thank you!

## 2023-01-20 NOTE — Telephone Encounter (Signed)
LVM for pt to cb and schedule.

## 2023-01-28 NOTE — Progress Notes (Signed)
02/03/23 ALL: [name] returns for Botox. She continues to report minimal improvement in migraine frequency. She feels that last procedure was effective for about 3 weeks but then migraines returned on near daily basis. She can not correlate with any specific triggers. She feels tress levels are well managed. She is sleeping well. She reports Nurtec works very well. It was approved through payer but remains expensive. She was previously getting masseter injections. We will see if this helps.   11/10/2022 ALL: [name] returns for Botox. She reports headaches have been worsen, again. She has had nearly daily headaches for the past 12 weeks. She has been taking ibuprofen, Advil and Excedrin multiple times a day. She reports using the Nurtec samples and feels they were effective. She reports having dental work recently that could have contributed.   08/11/2022 ALL: [name] returns for Botox. She is doing well. She reports migraines have been much better over the past 12 weeks. She was given Nurtec samples but has not needed them. She is feeling well and without concerns.   05/19/2022 ALL: [name] returns for Botox. She is doing fairly well. She reports migraines are usually very well managed for first 8 weeks after procedure but then return. She has had 25 migraine days in the past month. She is taking Excedrin daily for abortive therapy. She could not afford to continue Emgality. She has not inquired about patient assistance. 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 No samples were available so given 10 tabs of Nurtec.   02/23/2022 ALL: [name] returns for Botox. She called to report cost for Ajovy was too expensive. We switched her to [name] but cost remained too expensive. She reports migraines are stable. We did not have samples, today. I advised she call insurance agents to discuss possible supplement plans that may help with coverage versus applying for patient assistance.   11/25/2021 ALL: [name]  returns for Botox. She is doing well. Migraines seem to return about 2 weeks before Botox is due but she is able to abort with aspirin. She continues Ajovy every month.   09/02/2021 ALL: [name] returns for Botox. She continues Ajovy every 30 days. She reports headaches have been really bad over the past three weeks. She was seen in the ER yesterday for shob, abdominal pain, and decreased appetitive for the past 3 weeks. CT showed large hiatal hernia. Vitals and EKG normal. She is scheduled to see GI tomorrow. She request to continue with Botox procedure today to help settle down worsening headaches. No acute distress.   Medications tried and failed: Amovig (ineffective and weight gain) 08/2018 switched to Ajovy 07/2019 (works well), Cymbalta, Topamax (side effects), Depakote. Abortive: Rizatriptan (works well), Nurtec (works well), Imitrex (ineffective), Ubrelvy (ineffective), Zofran, Zyprexa.   06/09/2021 ALL: [name] returns for Botox. She continues to do well. She continues Ajovy samples provided by Dr [name]. She reports that she has not had any migrainous headaches since last being seen. Discussed Botox protocol.   03/05/2021 ALL: She returns for Botox. Rizatriptan helps with abortive needs. She has not had a headache since last procedure. She requests masseter and orb oculi injections as they help with clenching and retro orbital eye pain.   11/27/2020 AA: Stable: botox has "changed my life". +5 each masseter, +5 each orb oculi.  08/27/2020 AA:  +a. Needs dry needling 90% decrease in migraine frequency and severity since starting botox she may get one migraine in the last 1-2 weeks as it is wearinf off.  She clenches. She feels the botox wears off near the end..Ask her about Ajovy* in August 2022 if she would like to reconsider but she is doing fantastic on botox.   Consent Form Botulism Toxin Injection For Chronic Migraine   Reviewed orally with patient, additionally signature is on file:  Botulism  toxin has been approved by the Federal drug administration for treatment of chronic migraine. Botulism toxin does not cure chronic migraine and it may not be effective in some patients.  The administration of botulism toxin is accomplished by injecting a small amount of toxin into the muscles of the neck and head. Dosage must be titrated for each individual. Any benefits resulting from botulism toxin tend to wear off after 3 months with a repeat injection required if benefit is to be maintained. Injections are usually done every 3-4 months with maximum effect peak achieved by about 2 or 3 weeks. Botulism toxin is expensive and you should be sure of what costs you will incur resulting from the injection.  The side effects of botulism toxin use for chronic migraine may include:   -Transient, and usually mild, facial weakness with facial injections  -Transient, and usually mild, head or neck weakness with head/neck injections  -Reduction or loss of forehead facial animation due to forehead muscle weakness  -Eyelid drooping  -Dry eye  -Pain at the site of injection or bruising at the site of injection  -Double vision  -Potential unknown long term risks   Contraindications: You should not have Botox if you are pregnant, nursing, allergic to albumin, have an infection, skin condition, or muscle weakness at the site of the injection, or have myasthenia gravis, Lambert-Eaton syndrome, or ALS.  It is also possible that as with any injection, there may be an allergic reaction or no effect from the medication. Reduced effectiveness after repeated injections is sometimes seen and rarely infection at the injection site may occur. All care will be taken to prevent these side effects. If therapy is given over a long time, atrophy and wasting in the muscle injected may occur. Occasionally the patient's become refractory to treatment because they develop antibodies to the toxin. In this event, therapy needs to be  modified.  I have read the above information and consent to the administration of botulism toxin.    BOTOX PROCEDURE NOTE FOR MIGRAINE HEADACHE  Contraindications and precautions discussed with patient(above). Aseptic procedure was observed and patient tolerated procedure. Procedure performed by Shawnie Dapper, FNP-C.   The condition has existed for more than 6 months, and pt does not have a diagnosis of ALS, Myasthenia Gravis or Lambert-Eaton Syndrome.  Risks and benefits of injections discussed and pt agrees to proceed with the procedure.  Written consent obtained  These injections are medically necessary. Pt  receives good benefits from these injections. These injections do not cause sedations or hallucinations which the oral therapies may cause.   Description of procedure:  The patient was placed in a sitting position. The standard protocol was used for Botox as follows, with 5 units of Botox injected at each site:  -Procerus muscle, midline injection  -Corrugator muscle, bilateral injection  -Frontalis muscle, bilateral injection, with 2 sites each side, medial injection was performed in the upper one third of the frontalis muscle, in the region vertical from the medial inferior edge of the superior orbital rim. The lateral injection was again in the upper one third of the forehead vertically above the lateral limbus of the cornea, 1.5 cm lateral  to the medial injection site.  -Temporalis muscle injection, 4 sites, bilaterally. The first injection was 3 cm above the tragus of the ear, second injection site was 1.5 cm to 3 cm up from the first injection site in line with the tragus of the ear. The third injection site was 1.5-3 cm forward between the first 2 injection sites. The fourth injection site was 1.5 cm posterior to the second injection site. 5th site laterally in the temporalis  muscleat the level of the outer canthus.  -Occipitalis muscle injection, 3 sites, bilaterally. The first  injection was done one half way between the occipital protuberance and the tip of the mastoid process behind the ear. The second injection site was done lateral and superior to the first, 1 fingerbreadth from the first injection. The third injection site was 1 fingerbreadth superiorly and medially from the first injection site.  -Cervical paraspinal muscle injection, 2 sites, bilaterally. The first injection site was 1 cm from the midline of the cervical spine, 3 cm inferior to the lower border of the occipital protuberance. The second injection site was 1.5 cm superiorly and laterally to the first injection site.  -Trapezius muscle injection was performed at 3 sites, bilaterally. The first injection site was in the upper trapezius muscle halfway between the inflection point of the neck, and the acromion. The second injection site was one half way between the acromion and the first injection site. The third injection was done between the first injection site and the inflection point of the neck.  -masseter muscle bilateral injections 5u each   Will return for repeat injection in 3 months.   A total of 200 units of Botox was prepared, 165 units of Botox was injected as documented above, any Botox not injected was wasted. The patient tolerated the procedure well, there were no complications of the above procedure.

## 2023-02-03 ENCOUNTER — Ambulatory Visit: Payer: PPO | Admitting: Family Medicine

## 2023-02-03 DIAGNOSIS — G43709 Chronic migraine without aura, not intractable, without status migrainosus: Secondary | ICD-10-CM | POA: Diagnosis not present

## 2023-02-03 MED ORDER — ONABOTULINUMTOXINA 200 UNITS IJ SOLR
155.0000 [IU] | Freq: Once | INTRAMUSCULAR | Status: AC
Start: 2023-02-03 — End: 2023-02-03
  Administered 2023-02-03: 165 [IU] via INTRAMUSCULAR

## 2023-02-03 NOTE — Progress Notes (Signed)
Botox- 200 units x 1 vial Lot:[id number] Expiration:06/2025 NDC: 0023-3921-02  Bacteriostatic 0.9% Sodium Chloride- * mL  [id number] Expiration: 08/03/2023 NDC: 6578-4696-29  Dx: B28.413 B/B Witnessed by: Otilio Connors

## 2023-02-19 ENCOUNTER — Telehealth: Payer: Self-pay | Admitting: Primary Care

## 2023-02-19 ENCOUNTER — Other Ambulatory Visit: Payer: Self-pay | Admitting: Primary Care

## 2023-02-19 DIAGNOSIS — M810 Age-related osteoporosis without current pathological fracture: Secondary | ICD-10-CM

## 2023-02-19 DIAGNOSIS — E785 Hyperlipidemia, unspecified: Secondary | ICD-10-CM

## 2023-02-19 MED ORDER — ROSUVASTATIN CALCIUM 5 MG PO TABS
ORAL_TABLET | ORAL | 0 refills | Status: DC
Start: 2023-02-19 — End: 2023-05-16

## 2023-02-19 NOTE — Telephone Encounter (Signed)
Refills sent to pharmacy. 

## 2023-02-19 NOTE — Telephone Encounter (Signed)
Patient is due for CPE/follow up in early December, this will be required prior to any further refills.  Please schedule, thank you!

## 2023-02-19 NOTE — Telephone Encounter (Signed)
Prescription Request  02/19/2023  LOV: 06/12/2022  What is the name of the medication or equipment? rosuvastatin (CRESTOR) 5 MG tablet   Have you contacted your pharmacy to request a refill? No   Which pharmacy would you like this sent to?  [id number] [place], [place] - [address] [place] [address] Phone: [phone] Fax: [phone]     Patient notified that their request is being sent to the clinical staff for review and that they should receive a response within 2 business days.   Please advise at Mobile [phone] (mobile)

## 2023-02-19 NOTE — Telephone Encounter (Signed)
Patient has been scheduled

## 2023-02-19 NOTE — Addendum Note (Signed)
Addended by: Doreene Nest on: 02/19/2023 01:35 PM   Modules accepted: Orders

## 2023-03-14 ENCOUNTER — Other Ambulatory Visit: Payer: Self-pay | Admitting: Primary Care

## 2023-03-14 DIAGNOSIS — G4700 Insomnia, unspecified: Secondary | ICD-10-CM

## 2023-04-09 ENCOUNTER — Encounter: Payer: Self-pay | Admitting: Primary Care

## 2023-04-09 ENCOUNTER — Ambulatory Visit (INDEPENDENT_AMBULATORY_CARE_PROVIDER_SITE_OTHER): Payer: PPO | Admitting: Primary Care

## 2023-04-09 VITALS — BP 124/76 | HR 82 | Temp 97.2°F | Ht 60.0 in | Wt 111.0 lb

## 2023-04-09 DIAGNOSIS — G4700 Insomnia, unspecified: Secondary | ICD-10-CM | POA: Diagnosis not present

## 2023-04-09 DIAGNOSIS — Z860100 Personal history of colon polyps, unspecified: Secondary | ICD-10-CM

## 2023-04-09 DIAGNOSIS — J4520 Mild intermittent asthma, uncomplicated: Secondary | ICD-10-CM | POA: Diagnosis not present

## 2023-04-09 DIAGNOSIS — Z0000 Encounter for general adult medical examination without abnormal findings: Secondary | ICD-10-CM | POA: Diagnosis not present

## 2023-04-09 DIAGNOSIS — I10 Essential (primary) hypertension: Secondary | ICD-10-CM

## 2023-04-09 DIAGNOSIS — F419 Anxiety disorder, unspecified: Secondary | ICD-10-CM

## 2023-04-09 DIAGNOSIS — F32A Depression, unspecified: Secondary | ICD-10-CM | POA: Diagnosis not present

## 2023-04-09 DIAGNOSIS — H6122 Impacted cerumen, left ear: Secondary | ICD-10-CM | POA: Diagnosis not present

## 2023-04-09 DIAGNOSIS — K289 Gastrojejunal ulcer, unspecified as acute or chronic, without hemorrhage or perforation: Secondary | ICD-10-CM

## 2023-04-09 DIAGNOSIS — M810 Age-related osteoporosis without current pathological fracture: Secondary | ICD-10-CM | POA: Diagnosis not present

## 2023-04-09 DIAGNOSIS — H6120 Impacted cerumen, unspecified ear: Secondary | ICD-10-CM | POA: Insufficient documentation

## 2023-04-09 DIAGNOSIS — E785 Hyperlipidemia, unspecified: Secondary | ICD-10-CM | POA: Diagnosis not present

## 2023-04-09 DIAGNOSIS — R7303 Prediabetes: Secondary | ICD-10-CM | POA: Diagnosis not present

## 2023-04-09 DIAGNOSIS — G43709 Chronic migraine without aura, not intractable, without status migrainosus: Secondary | ICD-10-CM

## 2023-04-09 LAB — HEMOGLOBIN A1C: Hgb A1c MFr Bld: 5.8 % (ref 4.6–6.5)

## 2023-04-09 LAB — COMPREHENSIVE METABOLIC PANEL
ALT: 12 U/L (ref 0–35)
AST: 24 U/L (ref 0–37)
Albumin: 4.6 g/dL (ref 3.5–5.2)
Alkaline Phosphatase: 56 U/L (ref 39–117)
BUN: 12 mg/dL (ref 6–23)
CO2: 33 meq/L — ABNORMAL HIGH (ref 19–32)
Calcium: 9.9 mg/dL (ref 8.4–10.5)
Chloride: 97 meq/L (ref 96–112)
Creatinine, Ser: 0.74 mg/dL (ref 0.40–1.20)
GFR: 80.2 mL/min (ref >60.00)
Glucose, Bld: 92 mg/dL (ref 70–99)
Potassium: 4.4 meq/L (ref 3.5–5.1)
Sodium: 136 meq/L (ref 135–145)
Total Bilirubin: 0.4 mg/dL (ref 0.2–1.2)
Total Protein: 7 g/dL (ref 6.0–8.3)

## 2023-04-09 LAB — LIPID PANEL
Cholesterol: 180 mg/dL (ref 0–200)
HDL: 72.2 mg/dL (ref >39.00)
LDL Cholesterol: 85 mg/dL (ref 0–99)
NonHDL: 107.85
Total CHOL/HDL Ratio: 2
Triglycerides: 114 mg/dL (ref 0.0–149.0)
VLDL: 22.8 mg/dL (ref 0.0–40.0)

## 2023-04-09 NOTE — Assessment & Plan Note (Signed)
Up-to-date.  Recall due in 2025.

## 2023-04-09 NOTE — Assessment & Plan Note (Signed)
Repeat bone density scan due in 2025.  Will take a bisphosphonate break as she has been taking for > 5 years.  Revisit treatment in 1 year.

## 2023-04-09 NOTE — Assessment & Plan Note (Signed)
Controlled. Following with psychiatry.   Continue Zoloft 100 mg daily.

## 2023-04-09 NOTE — Assessment & Plan Note (Signed)
Controlled.  Continue lisinopril 20 mg daily.  CMP pending.

## 2023-04-09 NOTE — Assessment & Plan Note (Addendum)
Stable per patient.  She is unsure if the amitriptyline is effective.  We discussed that she could wean off. Instructions provided.   Continue daily meditation and regular exercise.  Continue amitriptyline 50 HS for now.

## 2023-04-09 NOTE — Assessment & Plan Note (Signed)
Immunizations UTD.  Mammogram UTD Bone density scan UTD Colonoscopy UTD, due 2025  Discussed the importance of a healthy diet and regular exercise in order for weight loss, and to reduce the risk of further co-morbidity.  Exam stable. Labs pending.  Follow up in 1 year for repeat physical.

## 2023-04-09 NOTE — Assessment & Plan Note (Signed)
Controlled. No concerns today.  Continue albuterol inhaler as needed.

## 2023-04-09 NOTE — Assessment & Plan Note (Signed)
Resolved.  Continue pantoprazole 40 mg daily.

## 2023-04-09 NOTE — Assessment & Plan Note (Signed)
Left cerumen impaction identified on exam. Patient consented to irrigation of canal on the left.   Left canal irrigated. Patient tolerated well. TM's and canals post irrigation unremarkable.   Discussed home care instructions.

## 2023-04-09 NOTE — Assessment & Plan Note (Signed)
Ongoing.  Following with neurology.  Continue Botox injections.

## 2023-04-09 NOTE — Assessment & Plan Note (Signed)
Repeat lipid panel pending. Continue rosuvastatin 5 mg daily.

## 2023-04-09 NOTE — Progress Notes (Signed)
Subjective:    Patient ID: [name], female    DOB: 27-Jun-1950, 72 y.o.   MRN: [id number]  HPI  [name] is a very pleasant 72 y.o. female who presents today for complete physical and follow up of chronic conditions.  Immunizations: -Tetanus: Completed in 2014 -Influenza: Completed the season -Shingles: Completed Shingrix series -Pneumonia: Completed Prevnar 20 in 2022, Prevnar 13 in 2017, Pneumovax 23 in 2018  Diet: Fair diet.  Exercise: Exercising 5 days weekly   Eye exam: Completes annually  Dental exam: Completes semi-annually    Mammogram: July 2024 Bone Density Scan: July 2023  Colonoscopy: Completed in 2022, due 2025  Wt Readings from Last 3 Encounters:  04/09/23 111 lb (50.3 kg)  12/15/22 113 lb (51.3 kg)  08/17/22 117 lb (53.1 kg)   BP Readings from Last 3 Encounters:  04/09/23 124/76  08/17/22 120/73  07/15/22 110/74        Review of Systems  Constitutional:  Negative for unexpected weight change.  HENT:  Positive for postnasal drip. Negative for rhinorrhea.   Respiratory:  Negative for cough and shortness of breath.   Cardiovascular:  Negative for chest pain.  Gastrointestinal:  Negative for constipation and diarrhea.  Genitourinary:  Negative for difficulty urinating.  Musculoskeletal:  Negative for arthralgias and myalgias.  Skin:  Negative for rash.  Allergic/Immunologic: Negative for environmental allergies.  Neurological:  Positive for headaches. Negative for dizziness.  Psychiatric/Behavioral:  Positive for sleep disturbance. The patient is not nervous/anxious.          Past Medical History:  Diagnosis Date   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   Anxiety    Asthma    as a child   CAP (community acquired pneumonia)    Cataract 2018   removed both eyes   COLONIC POLYPS, HX OF 11/15/2009   DEPRESSION 11/14/2009   GASTROJEJ ULCR UNS ACUT/CHRN W/O HEMOR PERF/OBST 11/06/2009   Generalized abdominal pain 12/02/2016    GERD (gastroesophageal reflux disease)    not on meds   Headache(784.0) 11/15/2009   Heart murmur    never bothered pt. always been told this   HEART MURMUR, HX OF 11/15/2009   HIATAL HERNIA 10/08/2009   Large   HIP FRACTURE, LEFT 11/14/2009   History of hiatal hernia    HYPERLIPIDEMIA 11/14/2009   HYPERTENSION 11/14/2009   OSTEOPOROSIS 11/14/2009   Prolonged Q-T interval on ECG 09/02/2021   WEIGHT GAIN 01/10/2010    Social History   Socioeconomic History   Marital status: Married    Spouse name: Onalee Hua   Number of children: 3   Years of education: BA   Highest education level: Not on file  Occupational History   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 Spa  Tobacco Use   Smoking status: Former    Current packs/day: 0.00    Types: Cigarettes    Start date: 05/05/1967    Quit date: 05/04/1982    Years since quitting: 40.9   Smokeless tobacco: Never   Tobacco comments:    Married, 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[place], 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 (stress)  Vaping Use   Vaping status: Never Used  Substance and Sexual Activity   Alcohol use: Not Currently    Alcohol/week: 1.0 standard drink of alcohol    Types: 1 Glasses of wine per week   Drug use: No   Sexual activity: Not Currently    Birth control/protection: None  Other Topics Concern  Not on file  Social History Narrative   Lives with husband   Caffeine use: 1 cup or less    Right handed   Social Determinants of Health   Financial Resource Strain: Low Risk  (12/15/2022)   Overall Financial Resource Strain (CARDIA)    Difficulty of Paying Living Expenses: Not hard at all  Food Insecurity: No Food Insecurity (12/15/2022)   Hunger Vital Sign    Worried About Running Out of Food in the Last Year: Never true    Ran Out of Food in the Last Year: Never true  Transportation Needs: No Transportation Needs (12/15/2022)   PRAPARE - Administrator, Civil Service  (Medical): No    Lack of Transportation (Non-Medical): No  Physical Activity: Sufficiently Active (12/15/2022)   Exercise Vital Sign    Days of Exercise per Week: 5 days    Minutes of Exercise per Session: 70 min  Stress: No Stress Concern Present (12/15/2022)   [employer] of Occupational Health - Occupational Stress Questionnaire    Feeling of Stress : Not at all  Social Connections: Moderately Isolated (12/15/2022)   Social Connection and Isolation Panel [NHANES]    Frequency of Communication with Friends and Family: More than three times a week    Frequency of Social Gatherings with Friends and Family: More than three times a week    Attends Religious Services: Never    Database administrator or Organizations: No    Attends Banker Meetings: Never    Marital Status: Married  Catering manager Violence: Not At Risk (12/15/2022)   Humiliation, Afraid, Rape, and Kick questionnaire    Fear of Current or Ex-Partner: No    Emotionally Abused: No    Physically Abused: No    Sexually Abused: No    Past Surgical History:  Procedure Laterality Date   APPENDECTOMY     AUGMENTATION MAMMAPLASTY Bilateral    BACK SURGERY  2003   Bleeding ulcers  10/2009   Broken Hip Left 06/2001   CATARACT EXTRACTION Bilateral 01/2017   COLONOSCOPY N/A 12/30/2016   Procedure: COLONOSCOPY WITH PROPOFOL;  Surgeon: Romie Levee, MD;  Location: [facility];  Service: Endoscopy;  Laterality: N/A;   COLONOSCOPY  2011   inadequate prep, polyps   GASTROSTOMY N/A 09/15/2021   Procedure: INSERTION OF GASTROSTOMY TUBE;  Surgeon: Berna Bue, MD;  Location: [facility];  Service: General;  Laterality: N/A;   NECK SURGERY  2004 & 2006   x's 2   POLYPECTOMY     RECTAL PROLAPSE REPAIR  01/2017   XI ROBOTIC ASSISTED PARAESOPHAGEAL HERNIA REPAIR N/A 09/15/2021   Procedure: XI ROBOTIC ASSISTED PARAESOPHAGEAL HERNIA REPAIR WITH GASTROPEXY;  Surgeon: Berna Bue, MD;  Location: [facility];  Service:  General;  Laterality: N/A;    Family History  Problem Relation Age of Onset   Bone cancer Mother    Hypertension Other    Allergic rhinitis Neg Hx    Asthma Neg Hx    Urticaria Neg Hx    Colon cancer Neg Hx    Colon polyps Neg Hx    Esophageal cancer Neg Hx    Rectal cancer Neg Hx    Stomach cancer Neg Hx    Breast cancer Neg Hx     No Known Allergies  Current Outpatient Medications on File Prior to Visit  Medication Sig Dispense Refill   amitriptyline (ELAVIL) 50 MG tablet TAKE 1 TABLET (50 MG TOTAL) BY MOUTH AT BEDTIME.  FOR SLEEP 90 tablet 0   CALCIUM PO Take 1 tablet by mouth daily.      ibandronate (BONIVA) 150 MG tablet TAKE 1 TABLET BY MOUTH EVERY 30 DAYS FOR BONE DENSITY. 3 tablet 0   lisinopril (ZESTRIL) 20 MG tablet TAKE 1 TABLET BY MOUTH EVERY DAY FOR BLOOD PRESSURE 90 tablet 3   Multiple Vitamins-Minerals (MULTIVITAMIN PO) Take 1 tablet by mouth daily.      pantoprazole (PROTONIX) 40 MG tablet TAKE 1 TABLET BY MOUTH TWICE A DAY 180 tablet 1   rosuvastatin (CRESTOR) 5 MG tablet TAKE 1 TABLET BY MOUTH EVERY DAY FOR CHOLESTEROL. 90 tablet 0   sertraline (ZOLOFT) 100 MG tablet Take two tablets daily. 180 tablet 1   Rimegepant Sulfate (NURTEC) 75 MG TBDP Take 1 tablet (75 mg total) by mouth daily as needed (take for abortive therapy of migraine, no more than 1 tablet in 24 hours or 10 per month). (Patient not taking: Reported on 04/09/2023) 8 tablet 11   No current facility-administered medications on file prior to visit.    BP 124/76   Pulse 82   Temp (!) 97.2 F (36.2 C) (Temporal)   Ht 5' (1.524 m)   Wt 111 lb (50.3 kg)   SpO2 97%   BMI 21.68 kg/m  Objective:   Physical Exam HENT:     Right Ear: Tympanic membrane and ear canal normal.     Left Ear: Tympanic membrane and ear canal normal. There is impacted cerumen.  Eyes:     Pupils: Pupils are equal, round, and reactive to light.  Cardiovascular:     Rate and Rhythm: Normal rate and regular rhythm.   Pulmonary:     Effort: Pulmonary effort is normal.     Breath sounds: Normal breath sounds.  Abdominal:     General: Bowel sounds are normal.     Palpations: Abdomen is soft.     Tenderness: There is no abdominal tenderness.  Musculoskeletal:        General: Normal range of motion.     Cervical back: Neck supple.  Skin:    General: Skin is warm and dry.  Neurological:     Mental Status: She is alert and oriented to person, place, and time.     Cranial Nerves: No cranial nerve deficit.     Deep Tendon Reflexes:     Reflex Scores:      Patellar reflexes are 2+ on the right side and 2+ on the left side. Psychiatric:        Mood and Affect: Mood normal.           Assessment & Plan:  Preventative health care Assessment & Plan: Immunizations UTD.  Mammogram UTD Bone density scan UTD Colonoscopy UTD, due 2025  Discussed the importance of a healthy diet and regular exercise in order for weight loss, and to reduce the risk of further co-morbidity.  Exam stable. Labs pending.  Follow up in 1 year for repeat physical.    Anxiety and depression Assessment & Plan: Controlled. Following with psychiatry.   Continue Zoloft 100 mg daily.   Insomnia, unspecified type Assessment & Plan: Stable per patient.  She is unsure if the amitriptyline is effective.  We discussed that she could wean off. Instructions provided.   Continue daily meditation and regular exercise.  Continue amitriptyline 50 HS for now.    Essential hypertension Assessment & Plan: Controlled.  Continue lisinopril 20 mg daily. CMP pending.  Orders: -  Comprehensive metabolic panel  Chronic migraine without aura without status migrainosus, not intractable Assessment & Plan: Ongoing.  Following with neurology.  Continue Botox injections.     Gastrojejunal ulcer Assessment & Plan: Resolved.  Continue pantoprazole 40 mg daily.   Osteoporosis without current pathological  fracture, unspecified osteoporosis type Assessment & Plan: Repeat bone density scan due in 2025.  Will take a bisphosphonate break as she has been taking for > 5 years.  Revisit treatment in 1 year.   Mild intermittent asthma without complication Assessment & Plan: Controlled. No concerns today.  Continue albuterol inhaler as needed.   History of colonic polyps Assessment & Plan: Up-to-date.  Recall due in 2025.   Hyperlipidemia, unspecified hyperlipidemia type Assessment & Plan: Repeat lipid panel pending. Continue rosuvastatin 5 mg daily.  Orders: -     Lipid panel  Prediabetes Assessment & Plan: Repeat A1c pending.  Orders: -     Hemoglobin A1c  Impacted cerumen of left ear Assessment & Plan: Left cerumen impaction identified on exam. Patient consented to irrigation of canal on the left.   Left canal irrigated. Patient tolerated well. TM's and canals post irrigation unremarkable.   Discussed home care instructions.           Doreene Nest, NP

## 2023-04-09 NOTE — Patient Instructions (Signed)
Stop by the lab prior to leaving today. I will notify you of your results once received.   It was a pleasure to see you today!  

## 2023-04-09 NOTE — Assessment & Plan Note (Signed)
Repeat A1c pending. 

## 2023-04-22 NOTE — Progress Notes (Signed)
05/03/23 ALL: [name] returns for Botox. She was last seen 02/2023 and reported minimal improvement in migraines. We added masseter injections at that visit as they had been helpful in the past. Since, she reports no significant change. She continues to have daily headaches, multiple headaches in a day. She takes aspirin daily that seems to help. She is aware of rebound but does not feel she can reduce intake. Masseter injections did not seem to be very helpful. She reports significant pain in her neck. No radiation to the head. She is s/p cervical surgery x 2 with Dr Channing Mutters.   02/03/2023 ALL: [name] returns for Botox. She continues to report minimal improvement in migraine frequency. She feels that last procedure was effective for about 3 weeks but then migraines returned on near daily basis. She can not correlate with any specific triggers. She feels stress levels are well managed. She is sleeping well. She reports Nurtec works very well. It was approved through payer but remains expensive. She was previously getting masseter injections. We will see if this helps.   11/10/2022 ALL: [name] returns for Botox. She reports headaches have been worsen, again. She has had nearly daily headaches for the past 12 weeks. She has been taking ibuprofen, Advil and Excedrin multiple times a day. She reports using the Nurtec samples and feels they were effective. She reports having dental work recently that could have contributed.   08/11/2022 ALL: [name] returns for Botox. She is doing well. She reports migraines have been much better over the past 12 weeks. She was given Nurtec samples but has not needed them. She is feeling well and without concerns.   05/19/2022 ALL: [name] returns for Botox. She is doing fairly well. She reports migraines are usually very well managed for first 8 weeks after procedure but then return. She has had 25 migraine days in the past month. She is taking Excedrin daily for abortive therapy. She could  not afford to continue Emgality. She has not inquired about patient assistance. 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 No samples were available so given 10 tabs of Nurtec.   02/23/2022 ALL: [name] returns for Botox. She called to report cost for Ajovy was too expensive. We switched her to [facility] but cost remained too expensive. She reports migraines are stable. We did not have samples, today. I advised she call insurance agents to discuss possible supplement plans that may help with coverage versus applying for patient assistance.   11/25/2021 ALL: [name] returns for Botox. She is doing well. Migraines seem to return about 2 weeks before Botox is due but she is able to abort with aspirin. She continues Ajovy every month.   09/02/2021 ALL: [name] returns for Botox. She continues Ajovy every 30 days. She reports headaches have been really bad over the past three weeks. She was seen in the ER yesterday for shob, abdominal pain, and decreased appetitive for the past 3 weeks. CT showed large hiatal hernia. Vitals and EKG normal. She is scheduled to see GI tomorrow. She request to continue with Botox procedure today to help settle down worsening headaches. No acute distress.   Medications tried and failed: Amovig (ineffective and weight gain) 08/2018 switched to Ajovy 07/2019 (works well), Cymbalta, Topamax (side effects), Depakote. Abortive: Rizatriptan (works well), Nurtec (works well), Imitrex (ineffective), Ubrelvy (ineffective), Zofran, Zyprexa.   06/09/2021 ALL: [name] returns for Botox. She continues to do well. She continues Ajovy samples provided by Dr Lucia Gaskins. She reports that  she has not had any migrainous headaches since last being seen. Discussed Botox protocol.   03/05/2021 ALL: She returns for Botox. Rizatriptan helps with abortive needs. She has not had a headache since last procedure. She requests masseter and orb oculi injections as they help with clenching and retro orbital eye pain.    11/27/2020 AA: Stable: botox has "changed my life". +5 each masseter, +5 each orb oculi.  08/27/2020 AA:  +a. Needs dry needling 90% decrease in migraine frequency and severity since starting botox she may get one migraine in the last 1-2 weeks as it is wearinf off. She clenches. She feels the botox wears off near the end..Ask her about Ajovy* in August 2022 if she would like to reconsider but she is doing fantastic on botox.   Consent Form Botulism Toxin Injection For Chronic Migraine   Reviewed orally with patient, additionally signature is on file:  Botulism toxin has been approved by the Federal drug administration for treatment of chronic migraine. Botulism toxin does not cure chronic migraine and it may not be effective in some patients.  The administration of botulism toxin is accomplished by injecting a small amount of toxin into the muscles of the neck and head. Dosage must be titrated for each individual. Any benefits resulting from botulism toxin tend to wear off after 3 months with a repeat injection required if benefit is to be maintained. Injections are usually done every 3-4 months with maximum effect peak achieved by about 2 or 3 weeks. Botulism toxin is expensive and you should be sure of what costs you will incur resulting from the injection.  The side effects of botulism toxin use for chronic migraine may include:   -Transient, and usually mild, facial weakness with facial injections  -Transient, and usually mild, head or neck weakness with head/neck injections  -Reduction or loss of forehead facial animation due to forehead muscle weakness  -Eyelid drooping  -Dry eye  -Pain at the site of injection or bruising at the site of injection  -Double vision  -Potential unknown long term risks   Contraindications: You should not have Botox if you are pregnant, nursing, allergic to albumin, have an infection, skin condition, or muscle weakness at the site of the injection, or  have myasthenia gravis, Lambert-Eaton syndrome, or ALS.  It is also possible that as with any injection, there may be an allergic reaction or no effect from the medication. Reduced effectiveness after repeated injections is sometimes seen and rarely infection at the injection site may occur. All care will be taken to prevent these side effects. If therapy is given over a long time, atrophy and wasting in the muscle injected may occur. Occasionally the patient's become refractory to treatment because they develop antibodies to the toxin. In this event, therapy needs to be modified.  I have read the above information and consent to the administration of botulism toxin.    BOTOX PROCEDURE NOTE FOR MIGRAINE HEADACHE  Contraindications and precautions discussed with patient(above). Aseptic procedure was observed and patient tolerated procedure. Procedure performed by Shawnie Dapper, FNP-C.   The condition has existed for more than 6 months, and pt does not have a diagnosis of ALS, Myasthenia Gravis or Lambert-Eaton Syndrome.  Risks and benefits of injections discussed and pt agrees to proceed with the procedure.  Written consent obtained  These injections are medically necessary. Pt  receives good benefits from these injections. These injections do not cause sedations or hallucinations which the oral therapies may cause.  Description of procedure:  The patient was placed in a sitting position. The standard protocol was used for Botox as follows, with 5 units of Botox injected at each site:  -Procerus muscle, midline injection  -Corrugator muscle, bilateral injection  -Frontalis muscle, bilateral injection, with 2 sites each side, medial injection was performed in the upper one third of the frontalis muscle, in the region vertical from the medial inferior edge of the superior orbital rim. The lateral injection was again in the upper one third of the forehead vertically above the lateral limbus of the  cornea, 1.5 cm lateral to the medial injection site.  -Temporalis muscle injection, 4 sites, bilaterally. The first injection was 3 cm above the tragus of the ear, second injection site was 1.5 cm to 3 cm up from the first injection site in line with the tragus of the ear. The third injection site was 1.5-3 cm forward between the first 2 injection sites. The fourth injection site was 1.5 cm posterior to the second injection site. 5th site laterally in the temporalis  muscleat the level of the outer canthus.  -Occipitalis muscle injection, 3 sites, bilaterally. The first injection was done one half way between the occipital protuberance and the tip of the mastoid process behind the ear. The second injection site was done lateral and superior to the first, 1 fingerbreadth from the first injection. The third injection site was 1 fingerbreadth superiorly and medially from the first injection site.  -Cervical paraspinal muscle injection, 2 sites, bilaterally. The first injection site was 1 cm from the midline of the cervical spine, 3 cm inferior to the lower border of the occipital protuberance. The second injection site was 1.5 cm superiorly and laterally to the first injection site.  -Trapezius muscle injection was performed at 3 sites, bilaterally. The first injection site was in the upper trapezius muscle halfway between the inflection point of the neck, and the acromion. The second injection site was one half way between the acromion and the first injection site. The third injection was done between the first injection site and the inflection point of the neck.  -masseter muscle bilateral injections 5u each   Will return for repeat injection in 3 months.   A total of 200 units of Botox was prepared, 165 units of Botox was injected as documented above, any Botox not injected was wasted. The patient tolerated the procedure well, there were no complications of the above procedure.

## 2023-04-25 ENCOUNTER — Other Ambulatory Visit: Payer: Self-pay | Admitting: Primary Care

## 2023-04-25 DIAGNOSIS — I10 Essential (primary) hypertension: Secondary | ICD-10-CM

## 2023-05-03 ENCOUNTER — Ambulatory Visit: Payer: PPO | Admitting: Family Medicine

## 2023-05-03 DIAGNOSIS — G43709 Chronic migraine without aura, not intractable, without status migrainosus: Secondary | ICD-10-CM

## 2023-05-03 MED ORDER — ONABOTULINUMTOXINA 200 UNITS IJ SOLR
155.0000 [IU] | Freq: Once | INTRAMUSCULAR | Status: AC
Start: 2023-05-03 — End: 2023-05-03
  Administered 2023-05-03: 165 [IU] via INTRAMUSCULAR

## 2023-05-03 NOTE — Progress Notes (Signed)
Botox- 200 units x 1 vial Lot: [id number] Expiration: 07/2025 NDC: 4540-9811-91  Bacteriostatic 0.9% Sodium Chloride- * mL  Lot: [id number] Expiration: 03/04/2024 NDC: 6213-0865-78  Dx: I69.629 B/B Witnessed by Ted Mcalpine, RMA

## 2023-05-11 ENCOUNTER — Other Ambulatory Visit: Payer: Self-pay | Admitting: Adult Health

## 2023-05-11 DIAGNOSIS — F411 Generalized anxiety disorder: Secondary | ICD-10-CM

## 2023-05-15 ENCOUNTER — Other Ambulatory Visit: Payer: Self-pay | Admitting: Primary Care

## 2023-05-15 DIAGNOSIS — M810 Age-related osteoporosis without current pathological fracture: Secondary | ICD-10-CM

## 2023-05-16 ENCOUNTER — Other Ambulatory Visit: Payer: Self-pay | Admitting: Primary Care

## 2023-05-16 DIAGNOSIS — E785 Hyperlipidemia, unspecified: Secondary | ICD-10-CM

## 2023-06-11 ENCOUNTER — Other Ambulatory Visit: Payer: Self-pay | Admitting: Primary Care

## 2023-06-11 DIAGNOSIS — G4700 Insomnia, unspecified: Secondary | ICD-10-CM

## 2023-06-23 ENCOUNTER — Encounter: Payer: Self-pay | Admitting: Adult Health

## 2023-06-23 ENCOUNTER — Ambulatory Visit: Payer: PPO | Admitting: Adult Health

## 2023-06-23 DIAGNOSIS — G4700 Insomnia, unspecified: Secondary | ICD-10-CM | POA: Diagnosis not present

## 2023-06-23 DIAGNOSIS — F411 Generalized anxiety disorder: Secondary | ICD-10-CM

## 2023-06-23 MED ORDER — SERTRALINE HCL 100 MG PO TABS
200.0000 mg | ORAL_TABLET | Freq: Every day | ORAL | 0 refills | Status: DC
Start: 2023-06-23 — End: 2023-12-10

## 2023-06-23 MED ORDER — TRAZODONE HCL 100 MG PO TABS: 100.0000 mg | ORAL_TABLET | Freq: Every day | ORAL | 1 refills | Status: DC

## 2023-06-23 NOTE — Progress Notes (Signed)
 [name] [id number] [birth year] 73 y.o.  Subjective:   Patient ID:  [name] is a 73 y.o. (DOB 1951-04-21) female.  Chief Complaint: No chief complaint on file.   HPI [name] presents to the office today for follow-up of GAD.  Describes mood today as "ok". Pleasant. Denies tearfulness. Mood symptoms - denies depression, anxiety, and irritability. Reports stable interest and motivation. Denies panic attacks. Reports some worry, rumination, and over thinking. Denies obsessive thoughts or acts. Mood is consistent. Stating "I feel like I'm doing ok". Feels like the Zoloft is working well for her. Also doing meditation - 76 days. Taking medications as prescribed.  Energy levels stable. Active, has a regular exercise routine 5 days a week. Enjoys some usual interests and activities. Married. Lives with husband. Has 3 children - grandchildren. Spending time with family. Appetite adequate. Weight loss - 112 pounds - 60". Sleeps well most nights. Averages 8 hours.  Focus and concentration stable. Completing tasks. Managing aspects of household. Retired. Denies SI or HI.  Denies AH or VH. Denies self harm. Denies substance use.   Previous medication trials: Trazadone, Amitriptyline, Wellbutrin, Gabapentin 900mg  daily, Prozac 80mg  daily, Cymbalta 40mg  daily, Hydroxyzine 75mg  daily, Clonidine 0.2mg  daily and Lamictal 100mg  daily, Viibyrd 40mg  daily, Clonazepam, Buspar   AUDIT    Flowsheet Row Clinical Support from 10/28/2021 in [facility] Clinical Support from 10/24/2020 in [facility]  Alcohol Use Disorder Identification Test Final Score (AUDIT) 4 4      GAD-7    Flowsheet Row Office Visit from 06/24/2017 in [facility]  Total GAD-7 Score 10      PHQ2-9    Flowsheet Row Clinical Support from 12/15/2022 in [facility] Office Visit from 06/12/2022 in [facility] [facility] Office Visit from 04/07/2022 in [facility] Clinical Support from 10[facility]28/2021 in [facility] Clinical Support from 10/24/2020 in [facility]  PHQ-2 Total Score 0 0 0 0 0  PHQ-9 Total Score -- -- 9 -- 0      Flowsheet Row Admission (Discharged) from 09/15/2021 in [facility] Pre-Admission Testing 60 from 09/10/2021 in [facility] from 09/01/2021 in [facility] at [facility]  C-SSRS RISK CATEGORY No Risk No Risk No Risk        Review of Systems:  Review of Systems  Musculoskeletal:  Negative for gait problem.  Neurological:  Negative for tremors.  Psychiatric/Behavioral:         Please refer to HPI    Medications: I have reviewed the patient's current medications.  Current Outpatient Medications  Medication Sig Dispense Refill   traZODone (DESYREL) 100 MG tablet Take 1 tablet (100 mg total) by mouth at bedtime. 90 tablet 1   CALCIUM PO Take 1 tablet by mouth daily.      lisinopril (ZESTRIL) 20 MG tablet TAKE 1 TABLET BY MOUTH EVERY DAY FOR BLOOD PRESSURE 90 tablet 2   Multiple Vitamins-Minerals (MULTIVITAMIN PO) Take 1 tablet by mouth daily.      pantoprazole (PROTONIX) 40 MG tablet TAKE 1 TABLET BY MOUTH TWICE A DAY 180 tablet 1   Rimegepant Sulfate (NURTEC) 75 MG TBDP Take 1 tablet (75 mg total) by mouth daily as needed (take for  abortive therapy of migraine, no more than 1 tablet in 24 hours or 10 per month). (Patient not taking: Reported on 05/03/2023) 8 tablet 11   rosuvastatin (CRESTOR) 5 MG tablet TAKE 1 TABLET BY MOUTH EVERY DAY FOR CHOLESTEROL 90 tablet 2   sertraline (ZOLOFT) 100 MG tablet Take 2 tablets (200 mg total) by mouth daily. 180 tablet 0   No current facility-administered medications for this visit.     Medication Side Effects: None  Allergies: No Known Allergies  Past Medical History:  Diagnosis Date   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   Anxiety    Asthma    as a child   CAP (community acquired pneumonia)    Cataract 2018   removed both eyes   COLONIC POLYPS, HX OF 11/15/2009   DEPRESSION 11/14/2009   GASTROJEJ ULCR UNS ACUT/CHRN W/O HEMOR PERF/OBST 11/06/2009   Generalized abdominal pain 12/02/2016   GERD (gastroesophageal reflux disease)    not on meds   Headache(784.0) 11/15/2009   Heart murmur    never bothered pt. always been told this   HEART MURMUR, HX OF 11/15/2009   HIATAL HERNIA 10/08/2009   Large   HIP FRACTURE, LEFT 11/14/2009   History of hiatal hernia    HYPERLIPIDEMIA 11/14/2009   HYPERTENSION 11/14/2009   OSTEOPOROSIS 11/14/2009   Prolonged Q-T interval on ECG 09/02/2021   WEIGHT GAIN 01/10/2010    Past Medical History, Surgical history, Social history, and Family history were reviewed and updated as appropriate.   Please see review of systems for further details on the patient's review from today.   Objective:   Physical Exam:  There were no vitals taken for this visit.  Physical Exam Constitutional:      General: She is not in acute distress. Musculoskeletal:        General: No deformity.  Neurological:     Mental Status: She is alert and oriented to person, place, and time.     Coordination: Coordination normal.  Psychiatric:        Attention and Perception: Attention and perception normal. She does not perceive auditory or visual hallucinations.        Mood and Affect: Affect is not labile, blunt, angry or inappropriate.        Speech: Speech normal.        Behavior: Behavior normal.        Thought Content: Thought content normal. Thought content is not paranoid or delusional. Thought content does not include homicidal or suicidal ideation. Thought content does not include homicidal or suicidal plan.        Cognition and Memory:  Cognition and memory normal.        Judgment: Judgment normal.     Comments: Insight intact     Lab Review:     Component Value Date/Time   NA 136 04/09/2023 1059   K 4.4 04/09/2023 1059   CL 97 04/09/2023 1059   CO2 33 (H) 04/09/2023 1059   GLUCOSE 92 04/09/2023 1059   BUN 12 04/09/2023 1059   CREATININE 0.74 04/09/2023 1059   CREATININE 1.01 (H) 02/25/2018 1448   CALCIUM 9.9 04/09/2023 1059   PROT 7.0 04/09/2023 1059   ALBUMIN 4.6 04/09/2023 1059   AST 24 04/09/2023 1059   ALT 12 04/09/2023 1059   ALKPHOS 56 04/09/2023 1059   BILITOT 0.4 04/09/2023 1059   GFRNONAA >60 09/16/2021 0438   GFRAA >60 01/02/2017 0515       Component Value Date/Time   WBC 9.4  06/02/2022 1210   RBC 4.16 06/02/2022 1210   HGB 13.0 06/02/2022 1210   HCT 37.7 06/02/2022 1210   PLT 294.0 06/02/2022 1210   MCV 90.7 06/02/2022 1210   MCH 32.3 09/16/2021 0438   MCHC 34.3 06/02/2022 1210   RDW 13.2 06/02/2022 1210   LYMPHSABS 2.8 06/02/2022 1210   MONOABS 0.7 06/02/2022 1210   EOSABS 0.2 06/02/2022 1210   BASOSABS 0.1 06/02/2022 1210    No results found for: "POCLITH", "LITHIUM"   No results found for: "PHENYTOIN", "PHENOBARB", "VALPROATE", "CBMZ"   .res Assessment: Plan:    Plan:  PDMP reviewed  Continue:  Zoloft 200mg  daily  Restarted Trazadone 100mg  at hs through PCP D/C Elavil 50mg  at bedtime.  RTC 6 months  Patient advised to contact office with any questions, adverse effects, or acute worsening in signs and symptoms.  Time spent with patient was 20 minutes. Greater than 50% of face to face time with patient was spent on counseling and coordination of care.    Diagnoses and all orders for this visit:  Insomnia, unspecified type -     traZODone (DESYREL) 100 MG tablet; Take 1 tablet (100 mg total) by mouth at bedtime.  Generalized anxiety disorder -     sertraline (ZOLOFT) 100 MG tablet; Take 2 tablets (200 mg total) by mouth daily.     Please see After Visit  Summary for patient specific instructions.  Future Appointments  Date Time Provider Department Center  07/26/2023  3:30 PM Lomax, Linton Rump, NP [facility] None  12/22/2023 11:30 AM [facility] ANNUAL WELLNESS VISIT 1 [facility]    No orders of the defined types were placed in this encounter.   -------------------------------

## 2023-06-25 DIAGNOSIS — J0140 Acute pansinusitis, unspecified: Secondary | ICD-10-CM | POA: Diagnosis not present

## 2023-06-25 DIAGNOSIS — R031 Nonspecific low blood-pressure reading: Secondary | ICD-10-CM | POA: Diagnosis not present

## 2023-06-27 ENCOUNTER — Emergency Department (HOSPITAL_COMMUNITY): Payer: PPO

## 2023-06-27 ENCOUNTER — Encounter (HOSPITAL_COMMUNITY): Payer: Self-pay

## 2023-06-27 ENCOUNTER — Emergency Department (HOSPITAL_COMMUNITY)
Admission: EM | Admit: 2023-06-27 | Discharge: 2023-06-28 | Disposition: A | Discharge status: Home / Self Care | Payer: PPO | Attending: Emergency Medicine | Admitting: Emergency Medicine

## 2023-06-27 DIAGNOSIS — M542 Cervicalgia: Secondary | ICD-10-CM | POA: Insufficient documentation

## 2023-06-27 DIAGNOSIS — Z7982 Long term (current) use of aspirin: Secondary | ICD-10-CM | POA: Insufficient documentation

## 2023-06-27 DIAGNOSIS — R519 Headache, unspecified: Secondary | ICD-10-CM | POA: Diagnosis not present

## 2023-06-27 DIAGNOSIS — G9331 Postviral fatigue syndrome: Secondary | ICD-10-CM | POA: Insufficient documentation

## 2023-06-27 DIAGNOSIS — D72829 Elevated white blood cell count, unspecified: Secondary | ICD-10-CM | POA: Insufficient documentation

## 2023-06-27 DIAGNOSIS — R059 Cough, unspecified: Secondary | ICD-10-CM | POA: Diagnosis not present

## 2023-06-27 DIAGNOSIS — Z981 Arthrodesis status: Secondary | ICD-10-CM | POA: Diagnosis not present

## 2023-06-27 DIAGNOSIS — R350 Frequency of micturition: Secondary | ICD-10-CM | POA: Diagnosis not present

## 2023-06-27 DIAGNOSIS — B349 Viral infection, unspecified: Secondary | ICD-10-CM | POA: Diagnosis not present

## 2023-06-27 LAB — RESP PANEL BY RT-PCR (RSV, FLU A&B, COVID)  RVPGX2
Influenza A by PCR: NEGATIVE
Influenza B by PCR: NEGATIVE
Resp Syncytial Virus by PCR: NEGATIVE
SARS Coronavirus 2 by RT PCR: NEGATIVE

## 2023-06-27 LAB — MENINGITIS/ENCEPHALITIS PANEL (CSF)

## 2023-06-27 LAB — URINALYSIS, W/ REFLEX TO CULTURE (INFECTION SUSPECTED)
Bacteria, UA: NONE SEEN
Bilirubin Urine: NEGATIVE
Glucose, UA: NEGATIVE mg/dL
Hgb urine dipstick: NEGATIVE
Ketones, ur: NEGATIVE mg/dL
Leukocytes,Ua: NEGATIVE
Nitrite: NEGATIVE
Protein, ur: NEGATIVE mg/dL
Specific Gravity, Urine: 1.019 (ref 1.005–1.030)
pH: 6 (ref 5.0–8.0)

## 2023-06-27 LAB — BASIC METABOLIC PANEL
Anion gap: 12 (ref 5–15)
BUN: 11 mg/dL (ref 8–23)
CO2: 24 mmol/L (ref 22–32)
Calcium: 9.6 mg/dL (ref 8.9–10.3)
Chloride: 101 mmol/L (ref 98–111)
Creatinine, Ser: 0.73 mg/dL (ref 0.44–1.00)
GFR, Estimated: >60 mL/min (ref >60)
Glucose, Bld: 97 mg/dL (ref 70–99)
Potassium: 4.2 mmol/L (ref 3.5–5.1)
Sodium: 137 mmol/L (ref 135–145)

## 2023-06-27 LAB — CSF CELL COUNT WITH DIFFERENTIAL
RBC Count, CSF: 135 /mm3 — ABNORMAL HIGH
Tube #: 3
WBC, CSF: 2 /mm3 (ref 0–5)

## 2023-06-27 LAB — CBC WITH DIFFERENTIAL/PLATELET
Abs Immature Granulocytes: 0.09 10*3/uL — ABNORMAL HIGH (ref 0.00–0.07)
Basophils Absolute: 0 10*3/uL (ref 0.0–0.1)
Basophils Relative: 0 %
Eosinophils Absolute: 0.4 10*3/uL (ref 0.0–0.5)
Eosinophils Relative: 2 %
HCT: 36.1 % (ref 36.0–46.0)
Hemoglobin: 12.1 g/dL (ref 12.0–15.0)
Immature Granulocytes: 1 %
Lymphocytes Relative: 11 %
Lymphs Abs: 1.8 10*3/uL (ref 0.7–4.0)
MCH: 31.3 pg (ref 26.0–34.0)
MCHC: 33.5 g/dL (ref 30.0–36.0)
MCV: 93.3 fL (ref 80.0–100.0)
Monocytes Absolute: 1.3 10*3/uL — ABNORMAL HIGH (ref 0.1–1.0)
Monocytes Relative: 8 %
Neutro Abs: 12.5 10*3/uL — ABNORMAL HIGH (ref 1.7–7.7)
Neutrophils Relative %: 78 %
Platelets: 398 10*3/uL (ref 150–400)
RBC: 3.87 MIL/uL (ref 3.87–5.11)
RDW: 12.4 % (ref 11.5–15.5)
WBC: 16 10*3/uL — ABNORMAL HIGH (ref 4.0–10.5)
nRBC: 0 % (ref 0.0–0.2)

## 2023-06-27 LAB — PROTEIN AND GLUCOSE, CSF
Glucose, CSF: 61 mg/dL (ref 40–70)
Total  Protein, CSF: 38 mg/dL (ref 15–45)

## 2023-06-27 LAB — CK: Total CK: 26 U/L — ABNORMAL LOW (ref 38–234)

## 2023-06-27 MED ORDER — LORAZEPAM 2 MG/ML IJ SOLN
1.0000 mg | Freq: Once | INTRAMUSCULAR | Status: AC | PRN
  Administered 2023-06-27: 1 mg via INTRAVENOUS
  Filled 2023-06-27: qty 1

## 2023-06-27 MED ORDER — FENTANYL CITRATE PF 50 MCG/ML IJ SOSY
50.0000 ug | PREFILLED_SYRINGE | Freq: Once | INTRAMUSCULAR | Status: AC
  Administered 2023-06-27: 50 ug via INTRAVENOUS
  Filled 2023-06-27: qty 1

## 2023-06-27 MED ORDER — LORAZEPAM 2 MG/ML IJ SOLN: 1.0000 mg | Freq: Once | INTRAMUSCULAR | Status: DC | PRN

## 2023-06-27 MED ORDER — SODIUM CHLORIDE 0.9 % IV BOLUS
1000.0000 mL | Freq: Once | INTRAVENOUS | Status: AC
  Administered 2023-06-27: 1000 mL via INTRAVENOUS

## 2023-06-27 MED ORDER — MORPHINE SULFATE (PF) 4 MG/ML IV SOLN
4.0000 mg | Freq: Once | INTRAVENOUS | Status: AC
  Administered 2023-06-27: 4 mg via INTRAVENOUS
  Filled 2023-06-27: qty 1

## 2023-06-27 MED ORDER — ACETAMINOPHEN 325 MG PO TABS
650.0000 mg | ORAL_TABLET | Freq: Once | ORAL | Status: AC
  Administered 2023-06-27: 650 mg via ORAL
  Filled 2023-06-27: qty 2

## 2023-06-27 MED ORDER — LORAZEPAM 2 MG/ML IJ SOLN: 0.5000 mg | Freq: Once | INTRAMUSCULAR | Status: DC | PRN

## 2023-06-27 MED ORDER — IOHEXOL 350 MG/ML SOLN
75.0000 mL | Freq: Once | INTRAVENOUS | Status: AC | PRN
  Administered 2023-06-27: 75 mL via INTRAVENOUS

## 2023-06-27 MED ORDER — METHOCARBAMOL 1000 MG/10ML IJ SOLN: 500.0000 mg | Freq: Once | INTRAMUSCULAR | Status: DC

## 2023-06-27 MED ORDER — LIDOCAINE-EPINEPHRINE (PF) 2 %-1:200000 IJ SOLN
10.0000 mL | Freq: Once | INTRAMUSCULAR | Status: AC
  Administered 2023-06-27: 10 mL
  Filled 2023-06-27: qty 20

## 2023-06-27 NOTE — ED Notes (Signed)
 Pt to CT at this time.

## 2023-06-27 NOTE — ED Provider Triage Note (Signed)
 Emergency Medicine Provider Triage Evaluation Note  [name] , a 73 y.o. female  was evaluated in triage.  Pt complains of body aches.  Review of Systems  Positive:  Negative:   Physical Exam  BP 124/76   Pulse 82   Temp 98.2 F (36.8 C)   Resp 16   Ht 5' (1.524 m)   Wt 50.3 kg   SpO2 100%   BMI 21.68 kg/m  Gen:   Awake, no distress   Resp:  Normal effort  MSK:   Moves extremities without difficulty  Other:    Medical Decision Making  Medically screening exam initiated at 2:17 PM.  Appropriate orders placed.  [name] was informed that the remainder of the evaluation will be completed by another provider, this initial triage assessment does not replace that evaluation, and the importance of remaining in the ED until their evaluation is complete.  Patient stating that she had a URI 2 weeks ago and was recovering fine. Patient woke up this morning with severe diffuse pain in head, neck fingers, hips, etc.    Dorthy Cooler, [place] 06/27/23 1417

## 2023-06-27 NOTE — ED Provider Notes (Signed)
  Provider Note MRN:  [id number]  Arrival date & time: 06/28/23    ED Course and Medical Decision Making  Assumed care of patient at sign-out or upon transfer.  Patient with recent flulike symptoms for a couple weeks, having been slowly improving.  Now with 2 days of headache, possible sinus infection, started on Augmentin yesterday.  Woke up with headache posterior neck pain this morning, difficult to range her neck having pain from her hips up to her head.  Thorough workup in process with LP, CTA, will follow-up on CSF results.  No neurodeficits.  12:20 AM update: Patient is very well-appearing on my evaluation, sitting upright in the chair next to the bed.  She describes pain to the posterior neck, bilateral hips, bilateral hands.  Patient's CTA imaging is unremarkable, her LP is without infectious concerns.  Suspect some type of post viral syndrome causing discomfort, possibly polymyalgia rheumatica which does have a postviral link according to the literature.  Will plan to treat with a burst of steroids and she will continue antibiotics at home to cover for possible secondary bacterial infection though low concern for this.  Appropriate for discharge.  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  Your exam/testing today is overall reassuring.  We suspect your symptoms are related to some postviral inflammation.  We discussed the possibility of polymyalgia rheumatica.  Recommend continuing the Augmentin antibiotic that you have already been prescribed.  We also recommend use of the prednisone  medication to treat your symptoms.  Take as directed daily.  Recommend Tylenol every 4 hours for any additional discomfort.  Please return to the Emergency Department if you experience any worsening of your condition.   Thank you for allowing [place] to be a part of your care.      Elmer Sow. Pilar Plate, MD [facility] Emergency Medicine [facility] [email or web address]    Sabas Sous, MD 06/28/23 [phone]

## 2023-06-27 NOTE — ED Provider Notes (Signed)
 [place] EMERGENCY DEPARTMENT AT [facility] Provider Note   CSN: [id number] Arrival date & time: 06/27/23  1332     History  Chief Complaint  Patient presents with   URI   Generalized Body Aches    [name] is a 73 y.o. female.  HPI 73 year old female presents with a chief complaint of headache and arthralgias.  She has been dealing with flulike symptoms including cough and congestion for a couple weeks and these are slowly improving.  2 days ago she woke up with a headache that she attributed to a possible sinus infection and went to urgent care and was started on Augmentin.  However when she woke up this morning the headache is worse and her neck is hurting, primarily in her left posterior neck.  Feels hard to range her neck in any direction.  She states she hurts from her hips up to her head.  She denies any specific shortness of breath, abdominal pain or leg pain.  Her diffuse low back is hurting and she has been having some increased urinary frequency but no dysuria over the last couple days. She has taken some aspirin for the pain as well as the antibiotics.   Home Medications Prior to Admission medications   Medication Sig Start Date End Date Taking? Authorizing Provider  CALCIUM PO Take 1 tablet by mouth daily.     [provider]  lisinopril (ZESTRIL) 20 MG tablet TAKE 1 TABLET BY MOUTH EVERY DAY FOR BLOOD PRESSURE 04/26/23   Doreene Nest, NP  Multiple Vitamins-Minerals (MULTIVITAMIN PO) Take 1 tablet by mouth daily.     [provider]  pantoprazole (PROTONIX) 40 MG tablet TAKE 1 TABLET BY MOUTH TWICE A DAY 01/20/23   Unk Lightning, PA  Rimegepant Sulfate (NURTEC) 75 MG TBDP Take 1 tablet (75 mg total) by mouth daily as needed (take for abortive therapy of migraine, no more than 1 tablet in 24 hours or 10 per month). Patient not taking: Reported on 05/03/2023 01/05/23   Lomax, Amy, NP  rosuvastatin (CRESTOR) 5 MG tablet  TAKE 1 TABLET BY MOUTH EVERY DAY FOR CHOLESTEROL 05/16/23   Doreene Nest, NP  sertraline (ZOLOFT) 100 MG tablet Take 2 tablets (200 mg total) by mouth daily. 06/23/23   Mozingo, Thereasa Solo, NP  traZODone (DESYREL) 100 MG tablet Take 1 tablet (100 mg total) by mouth at bedtime. 06/23/23   Mozingo, Thereasa Solo, NP      Allergies    Patient has no known allergies.    Review of Systems   Review of Systems  Constitutional:  Negative for fever.  Respiratory:  Positive for cough.   Gastrointestinal:  Negative for abdominal pain.  Genitourinary:  Positive for frequency. Negative for dysuria.  Musculoskeletal:  Positive for arthralgias, neck pain and neck stiffness.  Neurological:  Positive for weakness (feels all over weak) and headaches. Negative for numbness.    Physical Exam Updated Vital Signs BP (!) 140/79 (BP Location: Right Arm)   Pulse 84   Temp 98.8 F (37.1 C) (Oral)   Resp 16   Ht 5' (1.524 m)   Wt 50.3 kg   SpO2 98%   BMI 21.68 kg/m  Physical Exam Vitals and nursing note reviewed.  Constitutional:      General: She is not in acute distress.    Appearance: She is well-developed. She is not ill-appearing or diaphoretic.  HENT:     Head: Normocephalic and atraumatic.  Eyes:  Extraocular Movements: Extraocular movements intact.  Neck:     Comments: Mildly decreased ROM in all directions Cardiovascular:     Rate and Rhythm: Normal rate and regular rhythm.     Heart sounds: Normal heart sounds.  Pulmonary:     Effort: Pulmonary effort is normal.     Breath sounds: Normal breath sounds.  Abdominal:     General: There is no distension.     Palpations: Abdomen is soft.     Tenderness: There is no abdominal tenderness. There is no right CVA tenderness or left CVA tenderness.  Skin:    General: Skin is warm and dry.  Neurological:     Mental Status: She is alert.     Comments: CN 3-12 grossly intact. 5/5 strength in all 4 extremities. Grossly normal  sensation. Normal finger to nose.      ED Results / Procedures / Treatments   Labs (all labs ordered are listed, but only abnormal results are displayed) Labs Reviewed  CBC WITH DIFFERENTIAL/PLATELET - Abnormal; Notable for the following components:      Result Value   WBC 16.0 (*)    Neutro Abs 12.5 (*)    Monocytes Absolute 1.3 (*)    Abs Immature Granulocytes 0.09 (*)    All other components within normal limits  RESP PANEL BY RT-PCR (RSV, FLU A&B, COVID)  [id number]  BASIC METABOLIC PANEL  CK  URINALYSIS, W/ REFLEX TO CULTURE (INFECTION SUSPECTED)    EKG None  Radiology No results found.  Procedures Lumbar Puncture  Date/Time: 06/27/2023 9:31 PM  Performed by: Pricilla Loveless, MD Authorized by: Pricilla Loveless, MD   Consent:    Consent obtained:  Verbal and written   Consent given by:  Patient   Risks, benefits, and alternatives were discussed: yes   Universal protocol:    Patient identity confirmed:  Verbally with patient Pre-procedure details:    Procedure purpose:  Diagnostic   Preparation: Patient was prepped and draped in usual sterile fashion   Anesthesia:    Anesthesia method:  Local infiltration   Local anesthetic:  Lidocaine 2% WITH epi Procedure details:    Lumbar space:  L4-L5 interspace   Patient position:  R lateral decubitus   Needle gauge:  22   Number of attempts:  5 or more   Fluid appearance:  Clear   Tubes of fluid:  4   Total volume (ml):  5 Post-procedure details:    Puncture site:  Adhesive bandage applied   Procedure completion:  Tolerated well, no immediate complications     Medications Ordered in ED Medications  sodium chloride 0.9 % bolus 1,000 mL (has no administration in time range)  fentaNYL (SUBLIMAZE) injection 50 mcg (has no administration in time range)    ED Course/ Medical Decision Making/ A&P                                 Medical Decision Making Amount and/or Complexity of Data Reviewed Labs: ordered.     Details: Leukocytosis Radiology: ordered and independent interpretation performed.    Details: No head bleed  Risk OTC drugs. Prescription drug management.   Patient presents with multiple areas of pain, worst in head/neck.  Due to poorly controlled pain and some stiffness in her neck, LP was ultimately obtained.  The results are currently pending.  She is not ill-appearing but does have a leukocytosis of unclear etiology. Care transferred to  Dr. Pilar Plate.        Final Clinical Impression(s) / ED Diagnoses Final diagnoses:  None    Rx / DC Orders ED Discharge Orders     None         Pricilla Loveless, MD 06/27/23 2315

## 2023-06-27 NOTE — ED Triage Notes (Addendum)
 Pt c/o generalized body aches x1 day and URI x4 days.  Pain score 10/10.  Pt reports aspirin w/o relief.  Pt was seen at an UC and started on amoxicillin x2 days ago.     Pt reports feeling bad x2 weeks.

## 2023-06-28 MED ORDER — PREDNISONE 20 MG PO TABS: 40.0000 mg | ORAL_TABLET | Freq: Every day | ORAL | 0 refills | Status: AC

## 2023-06-28 MED ORDER — KETOROLAC TROMETHAMINE 15 MG/ML IJ SOLN
15.0000 mg | Freq: Once | INTRAMUSCULAR | Status: AC
  Administered 2023-06-28: 15 mg via INTRAVENOUS
  Filled 2023-06-28: qty 1

## 2023-06-28 NOTE — Discharge Instructions (Signed)
 You were evaluated in the Emergency Department and after careful evaluation, we did not find any emergent condition requiring admission or further testing in the hospital.  Your exam/testing today is overall reassuring.  We suspect your symptoms are related to some postviral inflammation.  We discussed the possibility of polymyalgia rheumatica.  Recommend continuing the Augmentin antibiotic that you have already been prescribed.  We also recommend use of the prednisone medication to treat your symptoms.  Take as directed daily.  Recommend Tylenol every 4 hours for any additional discomfort.  Please return to the Emergency Department if you experience any worsening of your condition.   Thank you for allowing [place] to be a part of your care.

## 2023-07-01 LAB — CSF CULTURE W GRAM STAIN: Gram Stain: NONE SEEN

## 2023-07-05 ENCOUNTER — Observation Stay (HOSPITAL_COMMUNITY)
Admission: EM | Admit: 2023-07-05 | Discharge: 2023-07-06 | Disposition: A | Discharge status: Home / Self Care | Attending: Internal Medicine | Admitting: Internal Medicine

## 2023-07-05 ENCOUNTER — Encounter (HOSPITAL_COMMUNITY): Payer: Self-pay

## 2023-07-05 ENCOUNTER — Other Ambulatory Visit: Payer: Self-pay

## 2023-07-05 ENCOUNTER — Emergency Department (HOSPITAL_COMMUNITY)

## 2023-07-05 DIAGNOSIS — R112 Nausea with vomiting, unspecified: Principal | ICD-10-CM | POA: Diagnosis present

## 2023-07-05 DIAGNOSIS — R000 Tachycardia, unspecified: Secondary | ICD-10-CM | POA: Diagnosis not present

## 2023-07-05 DIAGNOSIS — G4700 Insomnia, unspecified: Secondary | ICD-10-CM | POA: Diagnosis not present

## 2023-07-05 DIAGNOSIS — E860 Dehydration: Secondary | ICD-10-CM | POA: Diagnosis not present

## 2023-07-05 DIAGNOSIS — R531 Weakness: Secondary | ICD-10-CM | POA: Diagnosis not present

## 2023-07-05 DIAGNOSIS — F419 Anxiety disorder, unspecified: Secondary | ICD-10-CM | POA: Diagnosis not present

## 2023-07-05 DIAGNOSIS — E785 Hyperlipidemia, unspecified: Secondary | ICD-10-CM | POA: Diagnosis not present

## 2023-07-05 DIAGNOSIS — R110 Nausea: Secondary | ICD-10-CM | POA: Diagnosis not present

## 2023-07-05 DIAGNOSIS — Z87891 Personal history of nicotine dependence: Secondary | ICD-10-CM | POA: Insufficient documentation

## 2023-07-05 DIAGNOSIS — E871 Hypo-osmolality and hyponatremia: Secondary | ICD-10-CM | POA: Insufficient documentation

## 2023-07-05 DIAGNOSIS — E872 Acidosis, unspecified: Secondary | ICD-10-CM | POA: Diagnosis not present

## 2023-07-05 DIAGNOSIS — K219 Gastro-esophageal reflux disease without esophagitis: Secondary | ICD-10-CM | POA: Insufficient documentation

## 2023-07-05 DIAGNOSIS — D72829 Elevated white blood cell count, unspecified: Secondary | ICD-10-CM

## 2023-07-05 DIAGNOSIS — I10 Essential (primary) hypertension: Secondary | ICD-10-CM | POA: Diagnosis not present

## 2023-07-05 DIAGNOSIS — F32A Depression, unspecified: Secondary | ICD-10-CM | POA: Diagnosis not present

## 2023-07-05 DIAGNOSIS — R1111 Vomiting without nausea: Secondary | ICD-10-CM | POA: Diagnosis not present

## 2023-07-05 DIAGNOSIS — Z1152 Encounter for screening for COVID-19: Secondary | ICD-10-CM | POA: Insufficient documentation

## 2023-07-05 DIAGNOSIS — Z820 Family history of epilepsy and other diseases of the nervous system: Secondary | ICD-10-CM

## 2023-07-05 DIAGNOSIS — E861 Hypovolemia: Secondary | ICD-10-CM | POA: Insufficient documentation

## 2023-07-05 DIAGNOSIS — E8729 Other acidosis: Secondary | ICD-10-CM

## 2023-07-05 DIAGNOSIS — G43709 Chronic migraine without aura, not intractable, without status migrainosus: Secondary | ICD-10-CM | POA: Diagnosis not present

## 2023-07-05 DIAGNOSIS — A419 Sepsis, unspecified organism: Secondary | ICD-10-CM | POA: Insufficient documentation

## 2023-07-05 DIAGNOSIS — Z981 Arthrodesis status: Secondary | ICD-10-CM | POA: Diagnosis not present

## 2023-07-05 DIAGNOSIS — Z79899 Other long term (current) drug therapy: Secondary | ICD-10-CM | POA: Diagnosis not present

## 2023-07-05 DIAGNOSIS — R1084 Generalized abdominal pain: Secondary | ICD-10-CM | POA: Diagnosis not present

## 2023-07-05 DIAGNOSIS — J45909 Unspecified asthma, uncomplicated: Secondary | ICD-10-CM | POA: Diagnosis not present

## 2023-07-05 HISTORY — DX: Nausea with vomiting, unspecified: R11.2

## 2023-07-05 LAB — CBC
HCT: 32.1 % — ABNORMAL LOW (ref 36.0–46.0)
Hemoglobin: 11 g/dL — ABNORMAL LOW (ref 12.0–15.0)
MCH: 31.7 pg (ref 26.0–34.0)
MCHC: 34.3 g/dL (ref 30.0–36.0)
MCV: 92.5 fL (ref 80.0–100.0)
Platelets: 342 10*3/uL (ref 150–400)
RBC: 3.47 MIL/uL — ABNORMAL LOW (ref 3.87–5.11)
RDW: 13.1 % (ref 11.5–15.5)
WBC: 14.3 10*3/uL — ABNORMAL HIGH (ref 4.0–10.5)
nRBC: 0 % (ref 0.0–0.2)

## 2023-07-05 LAB — SEDIMENTATION RATE: Sed Rate: 14 mm/h (ref 0–22)

## 2023-07-05 LAB — CBC WITH DIFFERENTIAL/PLATELET
Abs Immature Granulocytes: 0.08 10*3/uL — ABNORMAL HIGH (ref 0.00–0.07)
Basophils Absolute: 0 10*3/uL (ref 0.0–0.1)
Basophils Relative: 0 %
Eosinophils Absolute: 0 10*3/uL (ref 0.0–0.5)
Eosinophils Relative: 0 %
HCT: 37.6 % (ref 36.0–46.0)
Hemoglobin: 12.4 g/dL (ref 12.0–15.0)
Immature Granulocytes: 1 %
Lymphocytes Relative: 10 %
Lymphs Abs: 1.4 10*3/uL (ref 0.7–4.0)
MCH: 31 pg (ref 26.0–34.0)
MCHC: 33 g/dL (ref 30.0–36.0)
MCV: 94 fL (ref 80.0–100.0)
Monocytes Absolute: 1.2 10*3/uL — ABNORMAL HIGH (ref 0.1–1.0)
Monocytes Relative: 8 %
Neutro Abs: 11.8 10*3/uL — ABNORMAL HIGH (ref 1.7–7.7)
Neutrophils Relative %: 81 %
Platelets: 375 10*3/uL (ref 150–400)
RBC: 4 MIL/uL (ref 3.87–5.11)
RDW: 13 % (ref 11.5–15.5)
WBC: 14.5 10*3/uL — ABNORMAL HIGH (ref 4.0–10.5)
nRBC: 0 % (ref 0.0–0.2)

## 2023-07-05 LAB — URINALYSIS, W/ REFLEX TO CULTURE (INFECTION SUSPECTED)
Bilirubin Urine: NEGATIVE
Glucose, UA: NEGATIVE mg/dL
Hgb urine dipstick: NEGATIVE
Ketones, ur: 20 mg/dL — AB
Leukocytes,Ua: NEGATIVE
Nitrite: NEGATIVE
Protein, ur: 100 mg/dL — AB
Specific Gravity, Urine: 1.015 (ref 1.005–1.030)
pH: 7 (ref 5.0–8.0)

## 2023-07-05 LAB — COMPREHENSIVE METABOLIC PANEL
ALT: 13 U/L (ref 0–44)
AST: 28 U/L (ref 15–41)
Albumin: 3.7 g/dL (ref 3.5–5.0)
Alkaline Phosphatase: 42 U/L (ref 38–126)
Anion gap: 16 — ABNORMAL HIGH (ref 5–15)
BUN: 14 mg/dL (ref 8–23)
CO2: 19 mmol/L — ABNORMAL LOW (ref 22–32)
Calcium: 9.3 mg/dL (ref 8.9–10.3)
Chloride: 99 mmol/L (ref 98–111)
Creatinine, Ser: 0.6 mg/dL (ref 0.44–1.00)
GFR, Estimated: >60 mL/min (ref >60)
Glucose, Bld: 93 mg/dL (ref 70–99)
Potassium: 3.7 mmol/L (ref 3.5–5.1)
Sodium: 134 mmol/L — ABNORMAL LOW (ref 135–145)
Total Bilirubin: 0.8 mg/dL (ref 0.0–1.2)
Total Protein: 6.6 g/dL (ref 6.5–8.1)

## 2023-07-05 LAB — I-STAT CG4 LACTIC ACID, ED
Lactic Acid, Venous: 2.5 mmol/L (ref 0.5–1.9)
Lactic Acid, Venous: 0.6 mmol/L (ref 0.5–1.9)

## 2023-07-05 LAB — C-REACTIVE PROTEIN: CRP: <0.5 mg/dL (ref <1.0)

## 2023-07-05 LAB — T4, FREE: Free T4: 0.92 ng/dL (ref 0.61–1.12)

## 2023-07-05 LAB — RESP PANEL BY RT-PCR (RSV, FLU A&B, COVID)  RVPGX2
Influenza A by PCR: NEGATIVE
Influenza B by PCR: NEGATIVE
Resp Syncytial Virus by PCR: NEGATIVE
SARS Coronavirus 2 by RT PCR: NEGATIVE

## 2023-07-05 LAB — CK: Total CK: 42 U/L (ref 38–234)

## 2023-07-05 LAB — TSH: TSH: 0.712 u[IU]/mL (ref 0.350–4.500)

## 2023-07-05 LAB — CREATININE, SERUM
Creatinine, Ser: 0.75 mg/dL (ref 0.44–1.00)
GFR, Estimated: >60 mL/min (ref >60)

## 2023-07-05 LAB — LACTATE DEHYDROGENASE: LDH: 153 U/L (ref 98–192)

## 2023-07-05 LAB — PROCALCITONIN: Procalcitonin: <0.1 ng/mL

## 2023-07-05 MED ORDER — ACETAMINOPHEN 325 MG PO TABS
650.0000 mg | ORAL_TABLET | Freq: Four times a day (QID) | ORAL | Status: DC | PRN
  Administered 2023-07-06: 650 mg via ORAL
  Filled 2023-07-05: qty 2

## 2023-07-05 MED ORDER — ONDANSETRON HCL 4 MG/2ML IJ SOLN: 4.0000 mg | Freq: Four times a day (QID) | INTRAMUSCULAR | Status: DC | PRN

## 2023-07-05 MED ORDER — MORPHINE SULFATE (PF) 4 MG/ML IV SOLN
4.0000 mg | Freq: Once | INTRAVENOUS | Status: AC
  Administered 2023-07-05: 4 mg via INTRAVENOUS
  Filled 2023-07-05: qty 1

## 2023-07-05 MED ORDER — LACTATED RINGERS IV SOLN: INTRAVENOUS | Status: DC

## 2023-07-05 MED ORDER — ONDANSETRON HCL 4 MG/2ML IJ SOLN
4.0000 mg | Freq: Once | INTRAMUSCULAR | Status: AC
  Administered 2023-07-05: 4 mg via INTRAVENOUS
  Filled 2023-07-05: qty 2

## 2023-07-05 MED ORDER — PANTOPRAZOLE SODIUM 40 MG IV SOLR
40.0000 mg | INTRAVENOUS | Status: DC
  Administered 2023-07-05: 40 mg via INTRAVENOUS
  Filled 2023-07-05: qty 10

## 2023-07-05 MED ORDER — SODIUM CHLORIDE 0.9% FLUSH: 3.0000 mL | Freq: Two times a day (BID) | INTRAVENOUS | Status: DC

## 2023-07-05 MED ORDER — ENOXAPARIN SODIUM 40 MG/0.4ML IJ SOSY
40.0000 mg | PREFILLED_SYRINGE | INTRAMUSCULAR | Status: DC
  Administered 2023-07-05: 40 mg via SUBCUTANEOUS
  Filled 2023-07-05: qty 0.4

## 2023-07-05 MED ORDER — METRONIDAZOLE 500 MG/100ML IV SOLN
500.0000 mg | Freq: Once | INTRAVENOUS | Status: AC
  Administered 2023-07-05: 500 mg via INTRAVENOUS
  Filled 2023-07-05: qty 100

## 2023-07-05 MED ORDER — ACETAMINOPHEN 650 MG RE SUPP: 650.0000 mg | Freq: Four times a day (QID) | RECTAL | Status: DC | PRN

## 2023-07-05 MED ORDER — METOCLOPRAMIDE HCL 5 MG/ML IJ SOLN: 5.0000 mg | Freq: Four times a day (QID) | INTRAMUSCULAR | Status: DC | PRN

## 2023-07-05 MED ORDER — VANCOMYCIN HCL IN DEXTROSE 1-5 GM/200ML-% IV SOLN
1000.0000 mg | Freq: Once | INTRAVENOUS | Status: AC
  Administered 2023-07-05: 1000 mg via INTRAVENOUS
  Filled 2023-07-05: qty 200

## 2023-07-05 MED ORDER — KETOROLAC TROMETHAMINE 15 MG/ML IJ SOLN
15.0000 mg | Freq: Once | INTRAMUSCULAR | Status: AC
  Administered 2023-07-05: 15 mg via INTRAVENOUS
  Filled 2023-07-05: qty 1

## 2023-07-05 MED ORDER — SODIUM CHLORIDE 0.9 % IV BOLUS (SEPSIS)
1000.0000 mL | Freq: Once | INTRAVENOUS | Status: AC
  Administered 2023-07-05: 1000 mL via INTRAVENOUS

## 2023-07-05 MED ORDER — KETOROLAC TROMETHAMINE 15 MG/ML IJ SOLN
15.0000 mg | Freq: Four times a day (QID) | INTRAMUSCULAR | Status: DC | PRN
  Administered 2023-07-06: 15 mg via INTRAVENOUS
  Filled 2023-07-05: qty 1

## 2023-07-05 MED ORDER — HYDROMORPHONE HCL 1 MG/ML IJ SOLN: 0.5000 mg | INTRAMUSCULAR | Status: DC | PRN

## 2023-07-05 MED ORDER — SODIUM CHLORIDE 0.9 % IV SOLN
2.0000 g | Freq: Once | INTRAVENOUS | Status: AC
  Administered 2023-07-05: 2 g via INTRAVENOUS
  Filled 2023-07-05: qty 12.5

## 2023-07-05 MED ORDER — ONDANSETRON HCL 4 MG PO TABS: 4.0000 mg | ORAL_TABLET | Freq: Four times a day (QID) | ORAL | Status: DC | PRN

## 2023-07-05 NOTE — Evaluation (Signed)
 Physical Therapy Evaluation and Discharge Patient Details Name: [name] MRN: [id number] DOB: Jun 17, 1950 Today's Date: 07/05/2023  History of Present Illness  73 y.o. female presented 3/3 with fatigue, Intractable nausea and vomiting with recent diarrhea  Associated dehydration with evaded anion gap acidemia. Past history of anemia, GERD, hyperlipidemia, hypertension.  Clinical Impression  Patient evaluated by Physical Therapy with no further acute PT needs identified. All education has been completed and the patient has no further questions. Independent and active at baseline, walks 5x/week on a treadmill for 1 hour as exercise. Pt feels close to baseline. Able to ambulate hallways without overt LOB. Minimal sway from straight path, gait speed slightly reduced but not surprising given symptoms. All questions answered. Good support from husband available at home if needed. Encouraged OOB often with staff. See below for any follow-up Physical Therapy or equipment needs. PT is signing off. Thank you for this referral.               Equipment Recommendations None recommended by PT     Functional Status Assessment Patient has not had a recent decline in their functional status     Precautions / Restrictions Precautions Precautions: None Recall of Precautions/Restrictions: Intact Restrictions Weight Bearing Restrictions Per Provider Order: No      Mobility  Bed Mobility Overal bed mobility: Independent             General bed mobility comments: Safe    Transfers Overall transfer level: Independent Equipment used: None               General transfer comment: stable upon rising    Ambulation/Gait Ambulation/Gait assistance: Modified independent (Device/Increase time) Gait Distance (Feet): 250 Feet Assistive device: None Gait Pattern/deviations: Step-through pattern, Drifts right/left Gait velocity: WFL Gait velocity interpretation: >2.62 ft/sec,  indicative of community ambulatory   General Gait Details: Minor drift initially, able to self correct. Able to dual task without evidence of LOB. Gait speed likely slightly reduced from baseline but not concerning given symptoms. Overall Mod I.  Stairs            Wheelchair Mobility     Tilt Bed    Modified Rankin (Stroke Patients Only)       Balance Overall balance assessment: Independent                                           Pertinent Vitals/Pain Pain Assessment Pain Assessment: Faces Faces Pain Scale: Hurts little more Pain Location: Headache Pain Descriptors / Indicators: Aching Pain Intervention(s): Monitored during session    Home Living Family/patient expects to be discharged to:: Private residence Living Arrangements: Spouse/significant other Available Help at Discharge: Family;Available 24 hours/day Type of Home: Other(Comment) (Condo) Home Access: Stairs to enter Entrance Stairs-Rails: Right;Left Entrance Stairs-Number of Steps: 2 (12 to descend into condo)   Home Layout: One level (After descending 1 flight of stairs to condo.) Home Equipment: Agricultural consultant (2 wheels);Cane - single point (knee scooter)      Prior Function Prior Level of Function : Independent/Modified Independent             Mobility Comments: ind, active, walks an hour 5x/week on treadmill ADLs Comments: ind, no issues, drives.     Extremity/Trunk Assessment   Upper Extremity Assessment Upper Extremity Assessment: Defer to OT evaluation    Lower Extremity Assessment Lower Extremity Assessment:  Overall WFL for tasks assessed       Communication   Communication Communication: No apparent difficulties    Cognition Arousal: Alert Behavior During Therapy: WFL for tasks assessed/performed   PT - Cognitive impairments: No apparent impairments                         Following commands: Intact       Cueing       General  Comments General comments (skin integrity, edema, etc.): Orthostatics taken (no drop in BP; asymptomatic) see vitals for details.    Exercises     Assessment/Plan    PT Assessment Patient does not need any further PT services  PT Problem List         PT Treatment Interventions      PT Goals (Current goals can be found in the Care Plan section)  Acute Rehab PT Goals Patient Stated Goal: Get well go home PT Goal Formulation: All assessment and education complete, DC therapy    Frequency       Co-evaluation               AM-PAC PT "6 Clicks" Mobility  Outcome Measure Help needed turning from your back to your side while in a flat bed without using bedrails?: None Help needed moving from lying on your back to sitting on the side of a flat bed without using bedrails?: None Help needed moving to and from a bed to a chair (including a wheelchair)?: None Help needed standing up from a chair using your arms (e.g., wheelchair or bedside chair)?: None Help needed to walk in hospital room?: None Help needed climbing 3-5 steps with a railing? : None 6 Click Score: 24    End of Session   Activity Tolerance: Patient tolerated treatment well Patient left: in bed;with call bell/phone within reach;with family/visitor present   PT Visit Diagnosis: Other abnormalities of gait and mobility (R26.89);Pain Pain - part of body:  (head)    Time: 4098-1191 PT Time Calculation (min) (ACUTE ONLY): 20 min   Charges:   PT Evaluation $PT Eval Low Complexity: 1 Low   PT General Charges $$ ACUTE PT VISIT: 1 Visit         Kathlyn Sacramento, PT, DPT [facility]  Rehabilitation Services Physical Therapist Office: [phone] Website: [email or web address]   Berton Mount 07/05/2023, 5:31 PM

## 2023-07-05 NOTE — ED Triage Notes (Signed)
 Pt bib ems from home c/o N/V, weakness, and fatigue for the past four days. The pt says she had flu like symptoms for over a month and went to the hospital last week. Pt was dx fibromyalgia and was given prednisone that she has completed. Pt has decreased appetite and negative orthostatic. Pt has headache and abdominal pain.    BP 150'S HR 116 RA 99% RR 18 CBG 100's t98.5

## 2023-07-05 NOTE — ED Provider Notes (Signed)
 [facility] AT [facility] Provider Note   CSN: [id number] Arrival date & time: 07/05/23  4098     History  Chief Complaint  Patient presents with   Weakness   Emesis   Nausea   Abdominal Pain    Nishka Alesa Echevarria is a 73 y.o. female.  Patient with history of anemia, GERD, hyperlipidemia, hypertension presents today with fatigue. States that her symptoms originally started about a month ago now when she had flu-like symptoms. She states that she was treating it as such with OTC medications until she woke up one morning with a headache. She was seen on 2/21 and diagnosed with a sinus infection, started on Augmentin. 2 days later pain became severe. Seen here for same on 2/23, had CT imaging of her head and an LP both of which were unremarkable. It was suspected she was having post viral symptoms, discharged home with prednisone. States she did get better with prednisone, but when she finished the course she began to feel worse again. States that over the last 4 days she has had nausea, vomiting, and diarrhea. Has some soreness in her abdomen but suspects this is due to vomiting. States she has a decreased appetite and has not been eating much. She notes she is normally active and exercises regularly, however now is so weak that she requires the assistance of her husband to get around. She notes that she is still coughing a bit with some yellow sputum but this seems to have improved. Denies any fevers or chills. Does note that she continues to have a headache. Denies vision changes. No chest pain or shortness of breath. No urinary symptoms. No leg pain or leg swelling.   The history is provided by the patient. No language interpreter was used.  Weakness Associated symptoms: diarrhea, nausea and vomiting   Emesis Associated symptoms: diarrhea   Abdominal Pain Associated symptoms: diarrhea, nausea and vomiting        Home Medications Prior to Admission  medications   Medication Sig Start Date End Date Taking? Authorizing Provider  CALCIUM PO Take 1 tablet by mouth daily.     [provider]  lisinopril (ZESTRIL) 20 MG tablet TAKE 1 TABLET BY MOUTH EVERY DAY FOR BLOOD PRESSURE 04/26/23   Doreene Nest, NP  Multiple Vitamins-Minerals (MULTIVITAMIN PO) Take 1 tablet by mouth daily.     [provider]  pantoprazole (PROTONIX) 40 MG tablet TAKE 1 TABLET BY MOUTH TWICE A DAY 01/20/23   Unk Lightning, PA  Rimegepant Sulfate (NURTEC) 75 MG TBDP Take 1 tablet (75 mg total) by mouth daily as needed (take for abortive therapy of migraine, no more than 1 tablet in 24 hours or 10 per month). Patient not taking: Reported on 05/03/2023 01/05/23   Lomax, Amy, NP  rosuvastatin (CRESTOR) 5 MG tablet TAKE 1 TABLET BY MOUTH EVERY DAY FOR CHOLESTEROL 05/16/23   Doreene Nest, NP  sertraline (ZOLOFT) 100 MG tablet Take 2 tablets (200 mg total) by mouth daily. 06/23/23   Mozingo, Thereasa Solo, NP  traZODone (DESYREL) 100 MG tablet Take 1 tablet (100 mg total) by mouth at bedtime. 06/23/23   Mozingo, Thereasa Solo, NP      Allergies    Patient has no known allergies.    Review of Systems   Review of Systems  Gastrointestinal:  Positive for diarrhea, nausea and vomiting.  Neurological:  Positive for weakness.  All other systems reviewed and are negative.  Physical Exam Updated Vital Signs BP 106/81   Pulse (!) 112   Temp 98.5 F (36.9 C) (Oral)   Resp 16   Ht 5' (1.524 m)   Wt 50.3 kg   SpO2 100%   BMI 21.68 kg/m  Physical Exam Vitals and nursing note reviewed.  Constitutional:      General: She is not in acute distress.    Appearance: Normal appearance. She is normal weight. She is not ill-appearing, toxic-appearing or diaphoretic.  HENT:     Head: Normocephalic and atraumatic.  Eyes:     Extraocular Movements: Extraocular movements intact.     Pupils: Pupils are equal, round, and reactive to light.   Cardiovascular:     Rate and Rhythm: Normal rate and regular rhythm.     Heart sounds: Normal heart sounds.  Pulmonary:     Effort: Pulmonary effort is normal. No respiratory distress.     Breath sounds: Normal breath sounds.  Abdominal:     General: Abdomen is flat.     Palpations: Abdomen is soft.     Tenderness: There is no abdominal tenderness.  Musculoskeletal:        General: Normal range of motion.     Cervical back: Normal range of motion.     Right lower leg: No edema.     Left lower leg: No edema.  Skin:    General: Skin is warm and dry.  Neurological:     General: No focal deficit present.     Mental Status: She is alert and oriented to person, place, and time.     GCS: GCS eye subscore is 4. GCS verbal subscore is 5. GCS motor subscore is 6.     Sensory: Sensation is intact.     Motor: Motor function is intact.     Coordination: Coordination is intact.     Gait: Gait is intact.     Comments: Alert and oriented to self, place, time and event.    Speech is fluent, clear without dysarthria or dysphasia.    Strength 5/5 in upper/lower extremities   Sensation intact in upper/lower extremities   Psychiatric:        Mood and Affect: Mood normal.        Behavior: Behavior normal.     ED Results / Procedures / Treatments   Labs (all labs ordered are listed, but only abnormal results are displayed) Labs Reviewed  COMPREHENSIVE METABOLIC PANEL - Abnormal; Notable for the following components:      Result Value   Sodium 134 (*)    CO2 19 (*)    Anion gap 16 (*)    All other components within normal limits  CBC WITH DIFFERENTIAL/PLATELET - Abnormal; Notable for the following components:   WBC 14.5 (*)    Neutro Abs 11.8 (*)    Monocytes Absolute 1.2 (*)    Abs Immature Granulocytes 0.08 (*)    All other components within normal limits  URINALYSIS, W/ REFLEX TO CULTURE (INFECTION SUSPECTED) - Abnormal; Notable for the following components:   Ketones, ur 20 (*)     Protein, ur 100 (*)    Bacteria, UA RARE (*)    All other components within normal limits  I-STAT CG4 LACTIC ACID, ED - Abnormal; Notable for the following components:   Lactic Acid, Venous 2.5 (*)    All other components within normal limits  RESP PANEL BY RT-PCR (RSV, FLU A&B, COVID)  RVPGX2  CULTURE, BLOOD (ROUTINE X 2)  CULTURE, BLOOD (ROUTINE X 2)  TSH  T4, FREE  I-STAT CG4 LACTIC ACID, ED    EKG EKG Interpretation Date/Time:  Monday July 05 2023 09:58:03 EST Ventricular Rate:  112 PR Interval:  167 QRS Duration:  78 QT Interval:  345 QTC Calculation: 471 R Axis:   79  Text Interpretation: Sinus tachycardia Right atrial enlargement Consider left ventricular hypertrophy Confirmed by Margarita Grizzle [phone]) on 07/05/2023 10:11:36 AM  Radiology DG Chest Port 1 View Result Date: 07/05/2023 CLINICAL DATA:  Sepsis. EXAM: PORTABLE CHEST 1 VIEW COMPARISON:  None Available. FINDINGS: The heart size and mediastinal contours are within normal limits. Mild scarring noted in the lingula or right middle lobe. The lungs are otherwise clear. Thoracic spine degenerative disc disease and cervical spine fusion hardware noted. IMPRESSION: No active disease. Electronically Signed   By: Danae Orleans M.D.   On: 07/05/2023 11:06    Procedures .Critical Care  Performed by: Silva Bandy, PA-C Authorized by: Silva Bandy, PA-C   Critical care provider statement:    Critical care time (minutes):  35   Critical care was necessary to treat or prevent imminent or life-threatening deterioration of the following conditions:  Sepsis   Critical care was time spent personally by me on the following activities:  Development of treatment plan with patient or surrogate, discussions with primary provider, evaluation of patient's response to treatment, examination of patient, obtaining history from patient or surrogate, ordering and review of laboratory studies, ordering and review of radiographic studies,  pulse oximetry, re-evaluation of patient's condition and review of old charts   Care discussed with: admitting provider       Medications Ordered in ED Medications  sodium chloride 0.9 % bolus 1,000 mL (has no administration in time range)  lactated ringers infusion (has no administration in time range)  ceFEPIme (MAXIPIME) 2 g in sodium chloride 0.9 % 100 mL IVPB (has no administration in time range)  metroNIDAZOLE (FLAGYL) IVPB 500 mg (has no administration in time range)  vancomycin (VANCOCIN) IVPB 1000 mg/200 mL premix (has no administration in time range)  sodium chloride 0.9 % bolus 1,000 mL (0 mLs Intravenous Stopped 07/05/23 1144)  ondansetron (ZOFRAN) injection 4 mg (4 mg Intravenous Given 07/05/23 1144)  morphine (PF) 4 MG/ML injection 4 mg (4 mg Intravenous Given 07/05/23 1255)    ED Course/ Medical Decision Making/ A&P                                 Medical Decision Making Amount and/or Complexity of Data Reviewed Labs: ordered. Radiology: ordered.  Risk Prescription drug management.   This patient is a 73 y.o. female who presents to the ED for concern of weakness, nausea, vomiting, diarrhea, this involves an extensive number of treatment options, and is a complaint that carries with it a high risk of complications and morbidity. The emergent differential diagnosis prior to evaluation includes, but is not limited to,  sepsis, dehydration, ACS/MI, Boerhaave's, DKA, elevated ICP, Ischemic bowel, drug-related, Appendicitis, Bowel obstruction, Electrolyte abnormalities, Pancreatitis, Biliary colic, Gastroenteritis, Gastroparesis, Hepatitis, Migraine, Thyroid disease, Renal colic, PUD, UTI  This is not an exhaustive differential.   Past Medical History / Co-morbidities / Social History:  has a past medical history of ANEMIA-IRON DEFICIENCY (11/14/2009), Anxiety, Asthma, CAP (community acquired pneumonia), Cataract (2018), COLONIC POLYPS, HX OF (11/15/2009), DEPRESSION  (11/14/2009), GASTROJEJ ULCR UNS ACUT/CHRN W/O HEMOR PERF/OBST (11/06/2009), Generalized abdominal pain (12/02/2016),  GERD (gastroesophageal reflux disease), Headache(784.0) (11/15/2009), Heart murmur, HEART MURMUR, HX OF (11/15/2009), HIATAL HERNIA (10/08/2009), HIP FRACTURE, LEFT (11/14/2009), History of hiatal hernia, HYPERLIPIDEMIA (11/14/2009), HYPERTENSION (11/14/2009), OSTEOPOROSIS (11/14/2009), Prolonged Q-T interval on ECG (09/02/2021), and WEIGHT GAIN (01/10/2010).  Additional history: Chart reviewed. Pertinent results include: patient seen on 2/23, had normal CTA and LP, discharged with prednisone   Physical Exam: Physical exam performed. The pertinent findings include: tachycardia, abdomen soft and nontender  Lab Tests: I ordered, and personally interpreted labs.  The pertinent results include:  WBC 14.5, improved from 16 8 days ago. Na 134, bicarb 19, anion gap 16 potentially due to starvation ketosis. Lactic 2.5 --> 0.6. UA with ketones and protein, 6-10 WBCs, rare bacteria, no leukocytes. Not obviously infected, no symptoms, will not treat.    Imaging Studies: I ordered imaging studies including CXR. I independently visualized and interpreted imaging which showed NAD. I agree with the radiologist interpretation.   Cardiac Monitoring:  The patient was maintained on a cardiac monitor.  My attending physician Dr. Rosalia Hammers viewed and interpreted the cardiac monitored which showed an underlying rhythm of: sinus tachycardia. I agree with this interpretation.   Medications: I ordered medication including fluids, morphine, zofran, vancomycin, flagyl, cefepime  for sepsis, headache, dehydration. Reevaluation of the patient after these medicines showed that the patient improved. I have reviewed the patients home medicines and have made adjustments as needed.   Disposition: After consideration of the diagnostic results and the patients response to treatment, I feel that patient will require  admission for concern for sepsis of unknown origin, new profound weakness and fatigue with unclear cause.  Discussed same with patient is understanding and in agreement with this.  Discussed patient with hospitalist who accepts patient for admission.  I discussed this case with my attending physician Dr. Rosalia Hammers who cosigned this note including patient's presenting symptoms, physical exam, and planned diagnostics and interventions. Attending physician stated agreement with plan or made changes to plan which were implemented.    Final Clinical Impression(s) / ED Diagnoses Final diagnoses:  Weakness  Sepsis, due to unspecified organism, unspecified whether acute organ dysfunction present [facility])    Rx / DC Orders ED Discharge Orders     None         Vear Clock 07/05/23 1525    Margarita Grizzle, MD 07/05/23 [id number]

## 2023-07-05 NOTE — Sepsis Progress Note (Signed)
 Code sepsis protocol being monitored by eLink.

## 2023-07-05 NOTE — H&P (Addendum)
 History and Physical    Patient: [name] BJY:[id number] DOB: [birth year] DOA: 07/05/2023 DOS: the patient was seen and examined on 07/05/2023 PCP: Doreene Nest, NP  Patient coming from: Home-with husband  Medical readiness/disposition: Pending completion of evaluation and positive response to clinical interventions patient potentially could be ready for discharge by 07/07/2023.  She will return home with her husband.  Chief Complaint:  Chief Complaint  Patient presents with   Weakness   Emesis   Nausea   Abdominal Pain   HPI: [name] is a 73 y.o. female with medical history significant of mild anemia chronic generalized anxiety disorder, hypertension, GERD, dyslipidemia, remote left hip fracture.  Patient was in her usual state of health until the beginning of February.  Her husband was diagnosed with influenza.  Several days later she developed similar symptoms and was diagnosed with presumptive influenza infection.  Several days into that infection she developed purulent yellow sputum and it was felt the symptoms were being caused by sinusitis and she was prescribed amoxicillin which she completed.  With initial onset of her symptoms she never had fevers although she would have rigors and hot and cold flashes.  She did not have any nausea vomiting or diarrhea at that time.  She did have generalized weakness and fatigue.  Unfortunately she never fully recovered and developed severe weakness.  At one point she had very severe bilateral hip, leg and upper extremity joint pain. The pain and the weakness was severe enough she was unable to use her hands or walk independently.  Because of these symptoms she presented to the ED on February 25 for evaluation.  In addition, she was having her typical migraine symptoms of headache and neck pain but due to her other symptoms it was felt meningitis evaluation need to be undertaken.  She underwent an LP that was unremarkable.  CTA of  the chest at that time was unremarkable.  Chest x-ray was also negative.  Viral testing at that time was negative for influenza, RSV or COVID.  It was felt the patient had some sort of unspecified myalgias syndrome which was felt to be transitory in nature and she was prescribed a short dose of prednisone.  She completed the prednisone this past Saturday and unfortunately her symptoms returned.  Again she has not had any fevers.  In addition after she completed the antibiotic she developed significant nausea and vomiting and has been unable to keep any food and most fluids down.  She did have some diarrhea that was large-volume watery without any blood.  She has not had any abdominal pain.  Over the past several days she has not had any diarrhea.  She has been extremely weak and has had to require the assistance of her husband for mobility.  At baseline she is very independent and exercises regularly.  She has reported a 7 pound weight loss since onset of symptoms.  Upon presentation to the ED she was afebrile, she was tachycardic with heart rates up into the 115 120 range.  She was normotensive and was not hypoxemic.  Her white count was elevated at 14,000 but she had just recently completed steroid taper this past Saturday (today is Monday).  She did appear to be dehydrated with an elevated anion gap acidosis of 16 with a low serum CO2 of 19, sodium was 134, initial lactic acid was 2.5 and after hydration is decreased to 0.6.  Urinalysis was mildly abnormal noting 20 ketones, 100  protein, rare bacteria, 6-10 WBCs.  She has not had any dysuria or urinary frequency.  Chest x-ray was unremarkable.  Patient has not had any known exposure to ticks.  She has had no other sick contacts.  He has not started any new medications other than those listed above. EDP gave 1 dose IV Maxipime.  Hospital service has been asked to evaluate the patient for admission.  Review of Systems: As mentioned in the history of present  illness. All other systems reviewed and are negative. Past Medical History:  Diagnosis Date   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   Anxiety    Asthma    as a child   CAP (community acquired pneumonia)    Cataract 2018   removed both eyes   COLONIC POLYPS, HX OF 11/15/2009   DEPRESSION 11/14/2009   GASTROJEJ ULCR UNS ACUT/CHRN W/O HEMOR PERF/OBST 11/06/2009   Generalized abdominal pain 12/02/2016   GERD (gastroesophageal reflux disease)    not on meds   Headache(784.0) 11/15/2009   Heart murmur    never bothered pt. always been told this   HEART MURMUR, HX OF 11/15/2009   HIATAL HERNIA 10/08/2009   Large   HIP FRACTURE, LEFT 11/14/2009   History of hiatal hernia    HYPERLIPIDEMIA 11/14/2009   HYPERTENSION 11/14/2009   Intractable nausea and vomiting 07/05/2023   OSTEOPOROSIS 11/14/2009   Prolonged Q-T interval on ECG 09/02/2021   WEIGHT GAIN 01/10/2010   Past Surgical History:  Procedure Laterality Date   APPENDECTOMY     AUGMENTATION MAMMAPLASTY Bilateral    BACK SURGERY  2003   Bleeding ulcers  10/2009   Broken Hip Left 06/2001   CATARACT EXTRACTION Bilateral 01/2017   COLONOSCOPY N/A 12/30/2016   Procedure: COLONOSCOPY WITH PROPOFOL;  Surgeon: Romie Levee, MD;  Location: [facility];  Service: Endoscopy;  Laterality: N/A;   COLONOSCOPY  2011   inadequate prep, polyps   GASTROSTOMY N/A 09/15/2021   Procedure: INSERTION OF GASTROSTOMY TUBE;  Surgeon: Berna Bue, MD;  Location: [facility] ORS;  Service: General;  Laterality: N/A;   NECK SURGERY  2004 & 2006   x's 2   POLYPECTOMY     RECTAL PROLAPSE REPAIR  01/2017   XI ROBOTIC ASSISTED PARAESOPHAGEAL HERNIA REPAIR N/A 09/15/2021   Procedure: XI ROBOTIC ASSISTED PARAESOPHAGEAL HERNIA REPAIR WITH GASTROPEXY;  Surgeon: Berna Bue, MD;  Location: [facility] ORS;  Service: General;  Laterality: N/A;   Social History:  reports that she quit smoking about 41 years ago. Her smoking use included cigarettes. She started smoking  about 56 years ago. She has never used smokeless tobacco. She reports that she does not currently use alcohol after a past usage of about 1.0 standard drink of alcohol per week. She reports that she does not use drugs.  No Known Allergies  Family History  Problem Relation Age of Onset   Bone cancer Mother    Hypertension Other    Allergic rhinitis Neg Hx    Asthma Neg Hx    Urticaria Neg Hx    Colon cancer Neg Hx    Colon polyps Neg Hx    Esophageal cancer Neg Hx    Rectal cancer Neg Hx    Stomach cancer Neg Hx    Breast cancer Neg Hx     Prior to Admission medications   Medication Sig Start Date End Date Taking? Authorizing Provider  CALCIUM PO Take 1 tablet by mouth daily.    Yes [provider]  lisinopril (ZESTRIL)  20 MG tablet TAKE 1 TABLET BY MOUTH EVERY DAY FOR BLOOD PRESSURE 04/26/23  Yes Doreene Nest, NP  Multiple Vitamins-Minerals (MULTIVITAMIN PO) Take 1 tablet by mouth daily.    Yes [provider]  pantoprazole (PROTONIX) 40 MG tablet TAKE 1 TABLET BY MOUTH TWICE A DAY 01/20/23  Yes Unk Lightning, PA  rosuvastatin (CRESTOR) 5 MG tablet TAKE 1 TABLET BY MOUTH EVERY DAY FOR CHOLESTEROL 05/16/23  Yes Doreene Nest, NP  sertraline (ZOLOFT) 100 MG tablet Take 2 tablets (200 mg total) by mouth daily. 06/23/23  Yes Mozingo, Thereasa Solo, NP  traZODone (DESYREL) 100 MG tablet Take 1 tablet (100 mg total) by mouth at bedtime. 06/23/23  Yes Mozingo, Thereasa Solo, NP    Physical Exam: Vitals:   07/05/23 1145 07/05/23 1215 07/05/23 1258 07/05/23 1530  BP: (!) 142/92 106/81  (!) 144/94  Pulse: (!) 108 (!) 112  (!) 105  Resp: 13 16  (!) 21  Temp:   98.5 F (36.9 C)   TempSrc:   Oral   SpO2: 97% 100%  100%  Weight:      Height:       Constitutional: Pleasant but appears acutely ill but otherwise nontoxic ENMT: Mucous membranes are dry. Posterior pharynx clear of any exudate or lesions.Normal dentition.  Respiratory: clear to  auscultation bilaterally, no wheezing, no crackles. Normal respiratory effort. No accessory muscle use.  Room air Cardiovascular: Regular rate and rhythm, no murmurs / rubs / gallops. No extremity edema. 2+ pedal pulses.  Appropriate capillary refill noted Abdomen: no tenderness, no masses palpated. No hepatosplenomegaly. Bowel sounds positive.  Musculoskeletal: no clubbing / cyanosis. No joint deformity upper and lower extremities. Good ROM, no contractures. Normal muscle tone.  Skin: no rashes, lesions, ulcers. No induration Neurologic: CN 2-12 grossly intact. Sensation intact, Strength 4/5 x all 4 extremities.  Psychiatric: Normal judgment and insight. Alert and oriented x 3. Normal mood.     Data Reviewed:  As per HPI  Assessment and Plan: Intractable nausea and vomiting with recent diarrhea Associated dehydration with evaded anion gap acidemia Presented with persistent symptoms after presumed viral illness Was also treated with amoxicillin for presumed bacterial sinusitis and subsequently developed nausea vomiting and diarrhea Follow-up on blood cultures-this will help rule out atypical cause such as Listeria (she has been afebrile but has had other symptoms that could be consistent with Listeria) At this juncture no indication to continue antibiotics but I will defer to attending physician-we will go ahead and check procalcitonin Urinalysis was mildly abnormal but patient asymptomatic in regards to UTI-urine culture obtained by EDP Regards to the nausea vomiting and diarrhea will rule out infectious causes such as norovirus and C. difficile especially after recent antibiotic use Patient has also been experiencing some unusual and severe myalgia which has improved after prednisone use.  This appears consistent with a severe postviral syndrome. Check LDH, CK, ESR and CRP; TSH and free T4 already ordered by EDP  Dehydration with elevated anion gap as above Continue lactated Ringer's IV  fluids at 150 cc/h Lactic acid has normalized after 1 L normal saline Repeat lab work in the a.m. Will allow sips of clears Symptom management for nausea with IV Zofran  Generalized weakness with physical deconditioning after prolonged illness Check orthostatic vital signs Physical therapy evaluation  Hypertension Hold home lisinopril for now  Dyslipidemia Hold Crestor  Generalized anxiety disorder Until can tolerate oral intake without vomiting will hold Zoloft and trazodone  GERD IV  Protonix daily-once can tolerate full liquids will advance to tablet form    Advance Care Planning:   Code Status: Full Code   VTE prophylaxis: Lovenox  Consults: None  Family Communication: Husband at bedside  Severity of Illness: The appropriate patient status for this patient is OBSERVATION. Observation status is judged to be reasonable and necessary in order to provide the required intensity of service to ensure the patient's safety. The patient's presenting symptoms, physical exam findings, and initial radiographic and laboratory data in the context of their medical condition is felt to place them at decreased risk for further clinical deterioration. Furthermore, it is anticipated that the patient will be medically stable for discharge from the hospital within 2 midnights of admission.   Author: Junious Silk, NP 07/05/2023 4:09 PM  For on call review [email or web address].

## 2023-07-06 ENCOUNTER — Other Ambulatory Visit (HOSPITAL_COMMUNITY): Payer: Self-pay

## 2023-07-06 DIAGNOSIS — R112 Nausea with vomiting, unspecified: Secondary | ICD-10-CM | POA: Diagnosis not present

## 2023-07-06 DIAGNOSIS — D72829 Elevated white blood cell count, unspecified: Secondary | ICD-10-CM

## 2023-07-06 DIAGNOSIS — E8729 Other acidosis: Secondary | ICD-10-CM

## 2023-07-06 DIAGNOSIS — Z820 Family history of epilepsy and other diseases of the nervous system: Secondary | ICD-10-CM

## 2023-07-06 MED ORDER — PANTOPRAZOLE SODIUM 40 MG PO TBEC
40.0000 mg | DELAYED_RELEASE_TABLET | Freq: Two times a day (BID) | ORAL | 1 refills | Status: DC
  Filled 2023-07-06 – 2023-08-02 (×2): qty 60, 30d supply, fill #0

## 2023-07-06 MED ORDER — ONDANSETRON 4 MG PO TBDP
4.0000 mg | ORAL_TABLET | Freq: Three times a day (TID) | ORAL | 0 refills | Status: DC | PRN
  Filled 2023-07-06: qty 20, 7d supply, fill #0

## 2023-07-06 NOTE — Discharge Summary (Signed)
 Physician Discharge Summary  [name] ZOX:[id number] DOB: November 18, 1950 DOA: 07/05/2023  PCP: Doreene Nest, NP  Admit date: 07/05/2023 Discharge date: 07/06/2023  Admitted From: Home Disposition:  Home  Recommendations for Outpatient Follow-up:  Follow up with PCP in 1-2 weeks Please obtain BMP/CBC in one week   Home Health:No Equipment/Devices:None  Discharge Condition:Stable CODE STATUS:Full Diet recommendation: Heart Healthy   Brief/Interim Summary: 73 y.o. female past medical history of anxiety depression migraine headaches, essential hypertension, history of prior cervical spine surgery recently seen in the ED on 06/27/2023 for myalgia and upper respiratory symptoms had a mild leukocytosis,CT angio with head and neck was unrevealing underwent lumbar puncture which was unrevealing and was discharged home on prednisone and amoxicillin (for possible sinusitis) for possible post infection symptoms, who presents with intractable nausea and vomiting that started 2 days prior to admission, last bowel movement was 2 days prior to admission.  She denies any prodromal's or neurological symptoms.  In the ED had a white count of 14 lactic acid 2.5 SARS-CoV-2 influenza PCR were negative.  UA showed no evidence of infection chest x-rays unremarkable started empirically on cefepime Vanco and Flagyl was fluid resuscitated   Discharge Diagnoses:  Principal Problem:   Intractable nausea and vomiting Active Problems:   Anxiety and depression   Essential hypertension   Chronic migraine without aura without status migrainosus, not intractable   Family history of migraine headaches   Leukocytosis   High anion gap metabolic acidosis  Intractable nausea and vomiting/dehydration: Her intractable nausea and vomiting seems to be resolved. She was placed on a diet full liquid, antiemetics and using Reglan for antiemetics and possible migraines flareup. And she tolerated her diet without any  nausea or vomiting.   Intractable headache/history of Chronic migraine without aura without status migrainosus, not intractable This could be contributing to her symptoms. CT of the head was unrevealing she has no focal neurological deficits on physical exam. CRP, procalcitonin and ESR were unremarkable.   History of anxiety and depression and insomnia: Followed by psych as an outpatient as seen on 06/23/2023. She is currently on Zoloft and trazodone.   Essential hypertension No changes made to her medication continue her current regimen.   Leukocytosis: Tmax 98.6, she was previously on steroids.   High anion gap metabolic acidosis: Likely due to dehydration was started on IV fluids now resolved.   Hypovolemic hyponatremia: Started on IV fluids basic metabolic panels pending this morning.   Generalized fatigue and weakness: Likely due to above TSH is unremarkable consulted PT OT    Discharge Instructions  Discharge Instructions     Diet - low sodium heart healthy   Complete by: As directed    Increase activity slowly   Complete by: As directed       Allergies as of 07/06/2023   No Known Allergies      Medication List     TAKE these medications    CALCIUM PO Take 1 tablet by mouth daily.   lisinopril 20 MG tablet Commonly known as: ZESTRIL TAKE 1 TABLET BY MOUTH EVERY DAY FOR BLOOD PRESSURE   MULTIVITAMIN PO Take 1 tablet by mouth daily.   ondansetron 4 MG disintegrating tablet Commonly known as: ZOFRAN-ODT Take 1 tablet (4 mg total) by mouth every 8 (eight) hours as needed for nausea or vomiting.   pantoprazole 40 MG tablet Commonly known as: PROTONIX Take 1 tablet (40 mg total) by mouth 2 (two) times daily.   rosuvastatin 5 MG  tablet Commonly known as: CRESTOR TAKE 1 TABLET BY MOUTH EVERY DAY FOR CHOLESTEROL   sertraline 100 MG tablet Commonly known as: ZOLOFT Take 2 tablets (200 mg total) by mouth daily.   traZODone 100 MG tablet Commonly  known as: DESYREL Take 1 tablet (100 mg total) by mouth at bedtime.        No Known Allergies  Consultations: None   Procedures/Studies: DG Chest Port 1 View Result Date: 07/05/2023 CLINICAL DATA:  Sepsis. EXAM: PORTABLE CHEST 1 VIEW COMPARISON:  None Available. FINDINGS: The heart size and mediastinal contours are within normal limits. Mild scarring noted in the lingula or right middle lobe. The lungs are otherwise clear. Thoracic spine degenerative disc disease and cervical spine fusion hardware noted. IMPRESSION: No active disease. Electronically Signed   By: Danae Orleans M.D.   On: 07/05/2023 11:06   CT ANGIO HEAD NECK W WO CM Result Date: 06/27/2023 CLINICAL DATA:  Vertebral artery dissection suspected EXAM: CT ANGIOGRAPHY HEAD AND NECK WITH AND WITHOUT CONTRAST TECHNIQUE: Multidetector CT imaging of the head and neck was performed using the standard protocol during bolus administration of intravenous contrast. Multiplanar CT image reconstructions and MIPs were obtained to evaluate the vascular anatomy. Carotid stenosis measurements (when applicable) are obtained utilizing NASCET criteria, using the distal internal carotid diameter as the denominator. RADIATION DOSE REDUCTION: This exam was performed according to the departmental dose-optimization program which includes automated exposure control, adjustment of the mA and/or kV according to patient size and/or use of iterative reconstruction technique. CONTRAST:  75mL OMNIPAQUE IOHEXOL 350 MG/ML SOLN COMPARISON:  None Available. FINDINGS: CT HEAD FINDINGS Brain: No mass,hemorrhage or extra-axial collection. Normal appearance of the parenchyma and CSF spaces. Vascular: No hyperdense vessel or unexpected vascular calcification. Skull: The visualized skull base, calvarium and extracranial soft tissues are normal. Sinuses/Orbits: No fluid levels or advanced mucosal thickening of the visualized paranasal sinuses. No mastoid or middle ear effusion.  Normal orbits. CTA NECK FINDINGS Skeleton: C3-7 anterior fusion. Other neck: Normal pharynx, larynx and major salivary glands. No cervical lymphadenopathy. Unremarkable thyroid gland. Upper chest: No pneumothorax or pleural effusion. No nodules or masses. Aortic arch: There is no calcific atherosclerosis of the aortic arch. Conventional 3 vessel aortic branching pattern. RIGHT carotid system: Normal without aneurysm, dissection or stenosis. LEFT carotid system: Normal without aneurysm, dissection or stenosis. Vertebral arteries: Codominant configuration. There is no dissection, occlusion or flow-limiting stenosis to the skull base (V1-V3 segments). CTA HEAD FINDINGS POSTERIOR CIRCULATION: Vertebral arteries are normal. No proximal occlusion of the anterior or inferior cerebellar arteries. Basilar artery is normal. Superior cerebellar arteries are normal. Posterior cerebral arteries are normal. ANTERIOR CIRCULATION: Intracranial internal carotid arteries are normal. Anterior cerebral arteries are normal. Middle cerebral arteries are normal. Venous sinuses: As permitted by contrast timing, patent. Anatomic variants: Fetal origins of both posterior cerebral arteries. Review of the MIP images confirms the above findings. IMPRESSION: Normal CTA of the head and neck. Electronically Signed   By: Deatra Robinson M.D.   On: 06/27/2023 19:11   DG Chest 2 View Result Date: 06/27/2023 CLINICAL DATA:  Cough.  Body aches. EXAM: CHEST - 2 VIEW COMPARISON:  09/01/2021 FINDINGS: Patient is rotated to the right. The heart size and mediastinal contours are within normal limits. Both lungs are clear. Previously seen hiatal hernia no longer visualized. Mild thoracic degenerative disc disease and cervical spine fusion hardware again noted. IMPRESSION: No active cardiopulmonary disease. Electronically Signed   By: Danae Orleans M.D.   On:  06/27/2023 16:50     Subjective: Relates she is tolerating her diet feels slightly  better.  Discharge Exam: Vitals:   07/06/23 0059 07/06/23 0557  BP: 118/64 122/61  Pulse: 84 79  Resp: 17 18  Temp: 98.6 F (37 C) 98.7 F (37.1 C)  SpO2: 97% 98%   Vitals:   07/05/23 1618 07/05/23 2149 07/06/23 0059 07/06/23 0557  BP: (!) 161/98 101/63 118/64 122/61  Pulse: 99 83 84 79  Resp:  14 17 18   Temp: 98.6 F (37 C) 98.8 F (37.1 C) 98.6 F (37 C) 98.7 F (37.1 C)  TempSrc: Oral Oral Axillary   SpO2: 100% 98% 97% 98%  Weight:      Height:        General: Pt is alert, awake, not in acute distress Cardiovascular: RRR, S1/S2 +, no rubs, no gallops Respiratory: CTA bilaterally, no wheezing, no rhonchi Abdominal: Soft, NT, ND, bowel sounds + Extremities: no edema, no cyanosis    The results of significant diagnostics from this hospitalization (including imaging, microbiology, ancillary and laboratory) are listed below for reference.     Microbiology: Recent Results (from the past 240 hours)  Resp panel by RT-PCR (RSV, Flu A&B, Covid) Anterior Nasal Swab     Status: None   Collection Time: 06/27/23  2:09 PM   Specimen: Anterior Nasal Swab  Result Value Ref Range Status   SARS Coronavirus 2 by RT PCR NEGATIVE NEGATIVE Final   Influenza A by PCR NEGATIVE NEGATIVE Final   Influenza B by PCR NEGATIVE NEGATIVE Final    Comment: (NOTE) The Xpert Xpress SARS-CoV-2/FLU/RSV plus assay is intended as an aid in the diagnosis of influenza from Nasopharyngeal swab specimens and should not be used as a sole basis for treatment. Nasal washings and aspirates are unacceptable for Xpert Xpress SARS-CoV-2/FLU/RSV testing.  Fact Sheet for Patients: BloggerCourse.com  Fact Sheet for Healthcare Providers: SeriousBroker.it  This test is not yet approved or cleared by the [place] FDA and has been authorized for detection and/or diagnosis of SARS-CoV-2 by FDA under an Emergency Use Authorization (EUA). This EUA will  remain in effect (meaning this test can be used) for the duration of the COVID-19 declaration under Section 564(b)(1) of the Act, 21 U.S.C. section 360bbb-3(b)(1), unless the authorization is terminated or revoked.     Resp Syncytial Virus by PCR NEGATIVE NEGATIVE Final    Comment: (NOTE) Fact Sheet for Patients: BloggerCourse.com  Fact Sheet for Healthcare Providers: SeriousBroker.it  This test is not yet approved or cleared by the [place] FDA and has been authorized for detection and/or diagnosis of SARS-CoV-2 by FDA under an Emergency Use Authorization (EUA). This EUA will remain in effect (meaning this test can be used) for the duration of the COVID-19 declaration under Section 564(b)(1) of the Act, 21 U.S.C. section 360bbb-3(b)(1), unless the authorization is terminated or revoked.  Performed at [facility], [address], [place], [place] [address]   CSF culture w Gram Stain     Status: None   Collection Time: 06/27/23  9:29 PM   Specimen: CSF; Cerebrospinal Fluid  Result Value Ref Range Status   Specimen Description CSF  Final   Special Requests NONE  Final   Gram Stain NO WBC SEEN NO ORGANISMS SEEN   Final   Culture   Final    NO GROWTH 3 DAYS Performed at [facility], [address], [place], [place] [address]    Report Status 07/01/2023 FINAL  Final  Resp panel by RT-PCR (RSV, Flu A&B, Covid) Anterior Nasal Swab     Status: None   Collection Time: 07/05/23  9:34 AM   Specimen: Anterior Nasal Swab  Result Value Ref Range Status   SARS Coronavirus 2 by RT PCR NEGATIVE NEGATIVE Final   Influenza A by PCR NEGATIVE NEGATIVE Final   Influenza B by PCR NEGATIVE NEGATIVE Final    Comment: (NOTE) The Xpert Xpress SARS-CoV-2/FLU/RSV plus assay is intended as an aid in the diagnosis of influenza from Nasopharyngeal swab specimens and should not be used as a sole basis for treatment. Nasal washings  and aspirates are unacceptable for Xpert Xpress SARS-CoV-2/FLU/RSV testing.  Fact Sheet for Patients: BloggerCourse.com  Fact Sheet for Healthcare Providers: SeriousBroker.it  This test is not yet approved or cleared by the [place] FDA and has been authorized for detection and/or diagnosis of SARS-CoV-2 by FDA under an Emergency Use Authorization (EUA). This EUA will remain in effect (meaning this test can be used) for the duration of the COVID-19 declaration under Section 564(b)(1) of the Act, 21 U.S.C. section 360bbb-3(b)(1), unless the authorization is terminated or revoked.     Resp Syncytial Virus by PCR NEGATIVE NEGATIVE Final    Comment: (NOTE) Fact Sheet for Patients: BloggerCourse.com  Fact Sheet for Healthcare Providers: SeriousBroker.it  This test is not yet approved or cleared by the [place] FDA and has been authorized for detection and/or diagnosis of SARS-CoV-2 by FDA under an Emergency Use Authorization (EUA). This EUA will remain in effect (meaning this test can be used) for the duration of the COVID-19 declaration under Section 564(b)(1) of the Act, 21 U.S.C. section 360bbb-3(b)(1), unless the authorization is terminated or revoked.  Performed at [facility], [address] [place], [place] [address]      Labs: BNP (last 3 results) No results for input(s): "BNP" in the last 8760 hours. Basic Metabolic Panel: Recent Labs  Lab 07/05/23 1255 07/05/23 1827  NA 134*  --   K 3.7  --   CL 99  --   CO2 19*  --   GLUCOSE 93  --   BUN 14  --   CREATININE 0.60 0.75  CALCIUM 9.3  --    Liver Function Tests: Recent Labs  Lab 07/05/23 1255  AST 28  ALT 13  ALKPHOS 42  BILITOT 0.8  PROT 6.6  ALBUMIN 3.7   No results for input(s): "LIPASE", "AMYLASE" in the last 168 hours. No results for input(s): "AMMONIA" in the last 168  hours. CBC: Recent Labs  Lab 07/05/23 1255 07/05/23 1827  WBC 14.5* 14.3*  NEUTROABS 11.8*  --   HGB 12.4 11.0*  HCT 37.6 32.1*  MCV 94.0 92.5  PLT 375 342   Cardiac Enzymes: Recent Labs  Lab 07/05/23 1827  CKTOTAL 42   BNP: Invalid input(s): "POCBNP" CBG: No results for input(s): "GLUCAP" in the last 168 hours. D-Dimer No results for input(s): "DDIMER" in the last 72 hours. Hgb A1c No results for input(s): "HGBA1C" in the last 72 hours. Lipid Profile No results for input(s): "CHOL", "HDL", "LDLCALC", "TRIG", "CHOLHDL", "LDLDIRECT" in the last 72 hours. Thyroid function studies Recent Labs    07/05/23 1827  TSH 0.712   Anemia work up No results for input(s): "VITAMINB12", "FOLATE", "FERRITIN", "TIBC", "IRON", "RETICCTPCT" in the last 72 hours. Urinalysis    Component Value Date/Time   COLORURINE YELLOW 07/05/2023 0934   APPEARANCEUR CLEAR 07/05/2023 0934   LABSPEC 1.015 07/05/2023  0934   PHURINE 7.0 07/05/2023 0934   GLUCOSEU NEGATIVE 07/05/2023 0934   HGBUR NEGATIVE 07/05/2023 0934   BILIRUBINUR NEGATIVE 07/05/2023 0934   KETONESUR 20 (A) 07/05/2023 0934   PROTEINUR 100 (A) 07/05/2023 0934   UROBILINOGEN 0.2 10/04/2009 1154   NITRITE NEGATIVE 07/05/2023 0934   LEUKOCYTESUR NEGATIVE 07/05/2023 0934   Sepsis Labs Recent Labs  Lab 07/05/23 1255 07/05/23 1827  WBC 14.5* 14.3*   Microbiology Recent Results (from the past 240 hours)  Resp panel by RT-PCR (RSV, Flu A&B, Covid) Anterior Nasal Swab     Status: None   Collection Time: 06/27/23  2:09 PM   Specimen: Anterior Nasal Swab  Result Value Ref Range Status   SARS Coronavirus 2 by RT PCR NEGATIVE NEGATIVE Final   Influenza A by PCR NEGATIVE NEGATIVE Final   Influenza B by PCR NEGATIVE NEGATIVE Final    Comment: (NOTE) The Xpert Xpress SARS-CoV-2/FLU/RSV plus assay is intended as an aid in the diagnosis of influenza from Nasopharyngeal swab specimens and should not be used as a sole basis for  treatment. Nasal washings and aspirates are unacceptable for Xpert Xpress SARS-CoV-2/FLU/RSV testing.  Fact Sheet for Patients: BloggerCourse.com  Fact Sheet for Healthcare Providers: SeriousBroker.it  This test is not yet approved or cleared by the [place] FDA and has been authorized for detection and/or diagnosis of SARS-CoV-2 by FDA under an Emergency Use Authorization (EUA). This EUA will remain in effect (meaning this test can be used) for the duration of the COVID-19 declaration under Section 564(b)(1) of the Act, 21 U.S.C. section 360bbb-3(b)(1), unless the authorization is terminated or revoked.     Resp Syncytial Virus by PCR NEGATIVE NEGATIVE Final    Comment: (NOTE) Fact Sheet for Patients: BloggerCourse.com  Fact Sheet for Healthcare Providers: SeriousBroker.it  This test is not yet approved or cleared by the [place] FDA and has been authorized for detection and/or diagnosis of SARS-CoV-2 by FDA under an Emergency Use Authorization (EUA). This EUA will remain in effect (meaning this test can be used) for the duration of the COVID-19 declaration under Section 564(b)(1) of the Act, 21 U.S.C. section 360bbb-3(b)(1), unless the authorization is terminated or revoked.  Performed at [facility], [address] [place], [place] [address]   CSF culture w Gram Stain     Status: None   Collection Time: 06/27/23  9:29 PM   Specimen: CSF; Cerebrospinal Fluid  Result Value Ref Range Status   Specimen Description CSF  Final   Special Requests NONE  Final   Gram Stain NO WBC SEEN NO ORGANISMS SEEN   Final   Culture   Final    NO GROWTH 3 DAYS Performed at [facility], [address] [place], [place] [address]    Report Status 07/01/2023 FINAL  Final  Resp panel by RT-PCR (RSV, Flu A&B, Covid) Anterior Nasal Swab     Status: None   Collection  Time: 07/05/23  9:34 AM   Specimen: Anterior Nasal Swab  Result Value Ref Range Status   SARS Coronavirus 2 by RT PCR NEGATIVE NEGATIVE Final   Influenza A by PCR NEGATIVE NEGATIVE Final   Influenza B by PCR NEGATIVE NEGATIVE Final    Comment: (NOTE) The Xpert Xpress SARS-CoV-2/FLU/RSV plus assay is intended as an aid in the diagnosis of influenza from Nasopharyngeal swab specimens and should not be used as a sole basis for treatment. Nasal washings and aspirates are unacceptable for Xpert Xpress SARS-CoV-2/FLU/RSV testing.  Fact Sheet for Patients: BloggerCourse.com  Fact Sheet for Healthcare Providers: SeriousBroker.it  This test is not yet approved or cleared by the [place] FDA and has been authorized for detection and/or diagnosis of SARS-CoV-2 by FDA under an Emergency Use Authorization (EUA). This EUA will remain in effect (meaning this test can be used) for the duration of the COVID-19 declaration under Section 564(b)(1) of the Act, 21 U.S.C. section 360bbb-3(b)(1), unless the authorization is terminated or revoked.     Resp Syncytial Virus by PCR NEGATIVE NEGATIVE Final    Comment: (NOTE) Fact Sheet for Patients: BloggerCourse.com  Fact Sheet for Healthcare Providers: SeriousBroker.it  This test is not yet approved or cleared by the [place] FDA and has been authorized for detection and/or diagnosis of SARS-CoV-2 by FDA under an Emergency Use Authorization (EUA). This EUA will remain in effect (meaning this test can be used) for the duration of the COVID-19 declaration under Section 564(b)(1) of the Act, 21 U.S.C. section 360bbb-3(b)(1), unless the authorization is terminated or revoked.  Performed at [facility], [address] [place], [place] [address]      Time coordinating discharge: Over 35 minutes  SIGNED:   Marinda Elk,  MD  [facility] 07/06/2023, 7:44 AM Pager   If 7PM-7AM, please contact night-coverage [email or web address] Password [email or web address]

## 2023-07-06 NOTE — Care Management Obs Status (Signed)
 MEDICARE OBSERVATION STATUS NOTIFICATION   Patient Details  Name: Denni France MRN: [id number] Date of Birth: April 06, 1951   Medicare Observation Status Notification Given:  Yes    Sherilyn Banker 07/06/2023, 11:11 AM

## 2023-07-07 ENCOUNTER — Ambulatory Visit: Payer: PPO | Admitting: Psychiatry

## 2023-07-10 LAB — CULTURE, BLOOD (ROUTINE X 2)
Culture: NO GROWTH
Culture: NO GROWTH

## 2023-07-15 ENCOUNTER — Other Ambulatory Visit: Payer: Self-pay | Admitting: Physician Assistant

## 2023-07-21 NOTE — Progress Notes (Signed)
 07/26/23 ALL: [name] returns for Botox. She has not noted any significant improvement for the past 9 months. She has had other health issues that likely contribute, however, Botox has not been as helpful as we had hoped. She continues to have near daily headaches. Most all are migrainous. She continues Nurtec as often as allowable and aspirin daily. Masseter injections have not been helpful. She continues to have significant neck pain. She was hospitalized two weeks ago with sepsis of unknown origin. She had been diagnosed with influenza prior but all testing was negative at time of hospitalization. She is feeling much better, today.  I have advised that she schedule follow up with Dr Lucia Gaskins to review care plan. She has failed multiple prevention and abortive options. Neck pain seems to be most debilitating contributor. She no longer seens NS as Dr Channing Mutters retired. We discussed Vyepti but she would like to discuss with Dr Lucia Gaskins.   05/03/2023 ALL: [name] returns for Botox. She was last seen 02/2023 and reported minimal improvement in migraines. We added masseter injections at that visit as they had been helpful in the past. Since, she reports no significant change. She continues to have daily headaches, multiple headaches in a day. She takes aspirin daily that seems to help. She is aware of rebound but does not feel she can reduce intake. Masseter injections did not seem to be very helpful. She reports significant pain in her neck. No radiation to the head. She is s/p cervical surgery x 2 with Dr Channing Mutters.   02/03/2023 ALL: [name] returns for Botox. She continues to report minimal improvement in migraine frequency. She feels that last procedure was effective for about 3 weeks but then migraines returned on near daily basis. She can not correlate with any specific triggers. She feels stress levels are well managed. She is sleeping well. She reports Nurtec works very well. It was approved through payer but remains  expensive. She was previously getting masseter injections. We will see if this helps.   11/10/2022 ALL: [name] returns for Botox. She reports headaches have been worsen, again. She has had nearly daily headaches for the past 12 weeks. She has been taking ibuprofen, Advil and Excedrin multiple times a day. She reports using the Nurtec samples and feels they were effective. She reports having dental work recently that could have contributed.   08/11/2022 ALL: [name] returns for Botox. She is doing well. She reports migraines have been much better over the past 12 weeks. She was given Nurtec samples but has not needed them. She is feeling well and without concerns.   05/19/2022 ALL: [name] returns for Botox. She is doing fairly well. She reports migraines are usually very well managed for first 8 weeks after procedure but then return. She has had 25 migraine days in the past month. She is taking Excedrin daily for abortive therapy. She could not afford to continue Emgality. She has not inquired about patient assistance. 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 No samples were available so given 10 tabs of Nurtec.   02/23/2022 ALL: [name] returns for Botox. She called to report cost for Ajovy was too expensive. We switched her to [facility] but cost remained too expensive. She reports migraines are stable. We did not have samples, today. I advised she call insurance agents to discuss possible supplement plans that may help with coverage versus applying for patient assistance.   11/25/2021 ALL: [name] returns for Botox. She is doing well. Migraines seem  to return about 2 weeks before Botox is due but she is able to abort with aspirin. She continues Ajovy every month.   09/02/2021 ALL: [name] returns for Botox. She continues Ajovy every 30 days. She reports headaches have been really bad over the past three weeks. She was seen in the ER yesterday for shob, abdominal pain, and decreased appetitive for the past  3 weeks. CT showed large hiatal hernia. Vitals and EKG normal. She is scheduled to see GI tomorrow. She request to continue with Botox procedure today to help settle down worsening headaches. No acute distress.   Medications tried and failed: Amovig (ineffective and weight gain) 08/2018 switched to Ajovy 07/2019 (works well), Cymbalta, Topamax (side effects), Depakote. Abortive: Rizatriptan (works well), Nurtec (works well), Imitrex (ineffective), Ubrelvy (ineffective), Zofran, Zyprexa.   06/09/2021 ALL: [name] returns for Botox. She continues to do well. She continues Ajovy samples provided by Dr Lucia Gaskins. She reports that she has not had any migrainous headaches since last being seen. Discussed Botox protocol.   03/05/2021 ALL: She returns for Botox. Rizatriptan helps with abortive needs. She has not had a headache since last procedure. She requests masseter and orb oculi injections as they help with clenching and retro orbital eye pain.   11/27/2020 AA: Stable: botox has "changed my life". +5 each masseter, +5 each orb oculi.  08/27/2020 AA:  +a. Needs dry needling 90% decrease in migraine frequency and severity since starting botox she may get one migraine in the last 1-2 weeks as it is wearinf off. She clenches. She feels the botox wears off near the end..Ask her about Ajovy* in August 2022 if she would like to reconsider but she is doing fantastic on botox.   Consent Form Botulism Toxin Injection For Chronic Migraine   Reviewed orally with patient, additionally signature is on file:  Botulism toxin has been approved by the Federal drug administration for treatment of chronic migraine. Botulism toxin does not cure chronic migraine and it may not be effective in some patients.  The administration of botulism toxin is accomplished by injecting a small amount of toxin into the muscles of the neck and head. Dosage must be titrated for each individual. Any benefits resulting from botulism toxin tend to  wear off after 3 months with a repeat injection required if benefit is to be maintained. Injections are usually done every 3-4 months with maximum effect peak achieved by about 2 or 3 weeks. Botulism toxin is expensive and you should be sure of what costs you will incur resulting from the injection.  The side effects of botulism toxin use for chronic migraine may include:   -Transient, and usually mild, facial weakness with facial injections  -Transient, and usually mild, head or neck weakness with head/neck injections  -Reduction or loss of forehead facial animation due to forehead muscle weakness  -Eyelid drooping  -Dry eye  -Pain at the site of injection or bruising at the site of injection  -Double vision  -Potential unknown long term risks   Contraindications: You should not have Botox if you are pregnant, nursing, allergic to albumin, have an infection, skin condition, or muscle weakness at the site of the injection, or have myasthenia gravis, Lambert-Eaton syndrome, or ALS.  It is also possible that as with any injection, there may be an allergic reaction or no effect from the medication. Reduced effectiveness after repeated injections is sometimes seen and rarely infection at the injection site may occur. All care will be taken to  prevent these side effects. If therapy is given over a long time, atrophy and wasting in the muscle injected may occur. Occasionally the patient's become refractory to treatment because they develop antibodies to the toxin. In this event, therapy needs to be modified.  I have read the above information and consent to the administration of botulism toxin.    BOTOX PROCEDURE NOTE FOR MIGRAINE HEADACHE  Contraindications and precautions discussed with patient(above). Aseptic procedure was observed and patient tolerated procedure. Procedure performed by Shawnie Dapper, FNP-C.   The condition has existed for more than 6 months, and pt does not have a diagnosis of  ALS, Myasthenia Gravis or Lambert-Eaton Syndrome.  Risks and benefits of injections discussed and pt agrees to proceed with the procedure.  Written consent obtained  These injections are medically necessary. Pt  receives good benefits from these injections. These injections do not cause sedations or hallucinations which the oral therapies may cause.   Description of procedure:  The patient was placed in a sitting position. The standard protocol was used for Botox as follows, with 5 units of Botox injected at each site:  -Procerus muscle, midline injection  -Corrugator muscle, bilateral injection  -Frontalis muscle, bilateral injection, with 2 sites each side, medial injection was performed in the upper one third of the frontalis muscle, in the region vertical from the medial inferior edge of the superior orbital rim. The lateral injection was again in the upper one third of the forehead vertically above the lateral limbus of the cornea, 1.5 cm lateral to the medial injection site.  -Temporalis muscle injection, 4 sites, bilaterally. The first injection was 3 cm above the tragus of the ear, second injection site was 1.5 cm to 3 cm up from the first injection site in line with the tragus of the ear. The third injection site was 1.5-3 cm forward between the first 2 injection sites. The fourth injection site was 1.5 cm posterior to the second injection site. 5th site laterally in the temporalis  muscleat the level of the outer canthus.  -Occipitalis muscle injection, 10 units at 3 sites, bilaterally. The first injection was done one half way between the occipital protuberance and the tip of the mastoid process behind the ear. The second injection site was done lateral and superior to the first, 1 fingerbreadth from the first injection. The third injection site was 1 fingerbreadth superiorly and medially from the first injection site.  -Cervical paraspinal muscle injection, 4 sites, bilaterally.    -Trapezius muscle injection was performed at 3 sites, bilaterally. The first injection site was in the upper trapezius muscle halfway between the inflection point of the neck, and the acromion. The second injection site was one half way between the acromion and the first injection site. The third injection was done between the first injection site and the inflection point of the neck.    Will return for repeat injection in 3 months.   A total of 200 units of Botox was prepared, 185 unites injected, any Botox not injected was wasted. The patient tolerated the procedure well, there were no complications of the above procedure.

## 2023-07-26 ENCOUNTER — Ambulatory Visit: Payer: PPO | Admitting: Family Medicine

## 2023-07-26 DIAGNOSIS — G43709 Chronic migraine without aura, not intractable, without status migrainosus: Secondary | ICD-10-CM

## 2023-07-26 MED ORDER — ONABOTULINUMTOXINA 200 UNITS IJ SOLR
155.0000 [IU] | Freq: Once | INTRAMUSCULAR | Status: AC
Start: 2023-07-26 — End: 2023-07-26
  Administered 2023-07-26: 185 [IU] via INTRAMUSCULAR

## 2023-07-26 NOTE — Progress Notes (Signed)
 Botox-200U x 1vial Lot: [id number] Expiration: 08/2025 NDC: 4540-9811-91  Bacteriostatic 0.9% Sodium Chloride- 4mL total [id number] Expiration:03/04/2024 NDC: 3086-5784-69  Buy/Bill Witnessed by: Cay Schillings

## 2023-07-28 ENCOUNTER — Ambulatory Visit: Admitting: Psychiatry

## 2023-07-28 ENCOUNTER — Ambulatory Visit (INDEPENDENT_AMBULATORY_CARE_PROVIDER_SITE_OTHER): Admitting: Psychiatry

## 2023-07-28 DIAGNOSIS — F411 Generalized anxiety disorder: Secondary | ICD-10-CM
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
## 2023-08-02 ENCOUNTER — Other Ambulatory Visit: Payer: Self-pay

## 2023-08-02 ENCOUNTER — Other Ambulatory Visit (HOSPITAL_COMMUNITY): Payer: Self-pay

## 2023-08-02 ENCOUNTER — Encounter: Payer: Self-pay | Admitting: Neurology

## 2023-08-09 ENCOUNTER — Ambulatory Visit: Admitting: Psychiatry

## 2023-08-09 DIAGNOSIS — F411 Generalized anxiety disorder: Secondary | ICD-10-CM | POA: Diagnosis not present

## 2023-08-09 NOTE — Progress Notes (Signed)
 [facility] Counselor/Therapist Progress Note  Patient ID: [name], MRN: [id number],    Date: 08/09/2023  Time Spent: 53 minutes   Treatment Type: Individual Therapy  Reported Symptoms:  anxiety, sadness   Mental Status Exam:  Appearance:   Casual and Well Groomed     Behavior:  Appropriate, Sharing, and Motivated  Motor:  Normal  Speech/Language:   Clear and Coherent  Affect:  anxious  Mood:  anxious  Thought process:  goal directed  Thought content:    WNL  Sensory/Perceptual disturbances:    WNL  Orientation:  oriented to person, place, time/date, situation, day of week, month of year, year, and stated date of August 09, 2023  Attention:  Good  Concentration:  Fair  Memory:  Some memory challenges  Fund of knowledge:   Good  Insight:    Good  Judgment:   Good  Impulse Control:  Good   Risk Assessment: Danger to Self:  No Self-injurious Behavior: No Danger to Others: No Duty to Warn:no Physical Aggression / Violence:No  Access to Firearms a concern: No  Gang Involvement:No   Subjective:  Patient today reporting some continued issues/differences with her 2 sisters, one in [place] and one in [place]. Mom died 14 yrs ago and sisters have not spoken to each other since. Admits this should have been worked on years ago.  This creates anxiety for patient, and still has not heard from either sister. Is wanting to try and meet or communicate in some way as an effort to re-connect and work together on their relationship, as "I miss them in my life". Issues with mom who was always negative with patient and sisters. Wants to reunite some with her 2 sisters as this is the base of her anxiety and sadness, especially since their mom's death. Very negatively impacted by current "politics and all that is going on politically in the world."  Discussed further her desires to reconnect with sister after 14 years and looked at several different scenarios as to how she  could work with this.  Patient motivated and liked some of the ideas we discussed today and makes a commitment that she is going to follow through on this with her sisters.  Plans to return for next session within 2 weeks.  Affect seem more bright and full towards end of session and not looking downward as much.  Seems to feel more optimistic, but we also did discuss if things do not go well with sisters and how she can help herself through that, as well as the fact that we will pick up on this at next session.  Interventions: Cognitive Behavioral Therapy and Ego-Supportive Worked with patient collaboratively on her treatment plan and she is in agreement with it.  Her goals will remain on treatment plan as patient works with strategies in sessions and outside of sessions to meet her goals.  Patient is signing a paper copy of her printed treatment goal plan instead of signing online.  Her progress will be assessed each session regarding her goals and documented in the "progress" or "plan" sections of treatment note.     Reduce overall level, frequency, and intensity of the anxiety so that daily functioning is not impaired.  Increase understanding of beliefs and messages that produce her worry and anxiety.  Identify and use specific coping strategies for anxiety reduction.   Diagnosis:   ICD-10-CM   1. Generalized anxiety disorder  F41.1  Plan: Patient in session today and did well and working on some very hurtful family situations.  She was very open and sharing details and we came up with a plan for her to be able to talk with her 2 sisters, who have not spoken in 14 years since her mother died.  Patient has been eager to at least reach out to them but was fearful she might get a negative response.  Discussed ways of this being a gradual type of possible reconciliation that might occur in steps but would at least give her the chance to reach out to them to let them know of her interest in  reconnecting and having some type of relationship going forward.  Patient motivated and may or may not reach out between now and next session but at least has some good ideas and thoughts to be considering.  Goal review and progress/challenges noted with patient.  Next appointment within 1-1/2 weeks.   Mathis Fare, LCSW

## 2023-08-25 ENCOUNTER — Ambulatory Visit: Admitting: Psychiatry

## 2023-08-25 DIAGNOSIS — F411 Generalized anxiety disorder: Secondary | ICD-10-CM

## 2023-08-25 NOTE — Progress Notes (Signed)
 [facility] Counselor/Therapist Progress Note  Patient ID: [name], MRN: [id number],    Date: 08/25/2023  Time Spent: 45 minutes   Treatment Type: Individual Therapy  Reported Symptoms: anxiety improving after follow through from last session   Mental Status Exam:  Appearance:   Casual and Neat     Behavior:  Appropriate, Sharing, and Motivated  Motor:  Normal  Speech/Language:   Clear and Coherent  Affect:  anxiety  Mood:  anxious  Thought process:  normal  Thought content:    WNL  Sensory/Perceptual disturbances:    WNL  Orientation:  oriented to person, place, time/date, situation, day of week, month of year, year, and stated date of August 25, 2023  Attention:  Good  Concentration:  Good  Memory:  Some memory issues  Fund of knowledge:   Good  Insight:    Good  Judgment:   Good  Impulse Control:  Good   Risk Assessment: Danger to Self:  No Self-injurious Behavior: No Danger to Others: No Duty to Warn:no Physical Aggression / Violence:No  Access to Firearms a concern: No  Gang Involvement:No   Subjective: Patient in today after her initial evaluation recently. Did follow through on talking with her both of her siblings re: some family issues. Talked with the 2 of her sisters and that sister shared with patient what had happened to negatively impact their relationship. Was angry and vented a lot to patient but patient was glad her sister "talked openly and did vent her frustration and anger" although seems to be interested in "moving forward" in their relationship. Patient has initiated contact with both sisters and feels they are taking the steps needed to have a healthier relationship.  Patient is feeling more confident and glad that she followed up from her last session and did make contact with both adult sisters.  States "it has been too long since we have talked", and there have been hard feelings building up preventing further contact between the 3  of them.  The sisters have actually been split up and not communicating for about 14 years.  Patient feeling much more positive and expressed several times today her appreciation and being seen and helped to come up with some good options for approaching her 2 sisters and trying to have some type of interaction and communication to where they could heal some of the past hurts that they have been holding onto and be able to move forward.  After several contacts with them recently patient is feeling confident that they are actually listening to each other and working together to heal their hurts from the past and move forward now in the present and into the future as they work together to heal their relationships and become healthier in their interactions with each other.  Right now patient does not feel the need to return but stated that she would like to keep the option open in case she needs to return.  She is very grateful for help thus far and the progress that she has made for herself.  Patient was assured that she can call and return in the future as the need arises.  She was more emotionally grounded and her affect reflected more peace and contentment.  Interventions: Cognitive Behavioral Therapy and Ego-Supportive Worked with patient collaboratively on her treatment plan and she is in agreement with it.  Her goals will remain on treatment plan as patient works with strategies in sessions and outside of  sessions to meet her goals.  Patient is signing a paper copy of her printed treatment goal plan instead of signing online.  Her progress will be assessed each session regarding her goals and documented in the "progress" or "plan" sections of treatment note.     Reduce overall level, frequency, and intensity of the anxiety so that daily functioning is not impaired.  Increase understanding of beliefs and messages that produce her worry and anxiety.  Identify and use specific coping strategies for anxiety  reduction.   Diagnosis:   ICD-10-CM   1. Generalized anxiety disorder  F41.1      Plan: Patient in session today smiling more and feeling more confident in herself.  Also feeling more motivated to follow through with her 2 adult sisters who have been estranged from each other for the past 14 years due to conflict that patient really did not understand.  Patient was able to reach out to both sisters after a session here and both sisters responded in a helpful way, wanting to work on their relationship with patient going forward.  Patient understands they have more talking to do with each other but they have done a lot of talking and listening since patient's first session with therapist here.  She feels that there is some forgiveness happening, some gratitude forming, and that there is hope for their relationship to continue moving in a more positive direction at this point.  We are not rescheduling her right now.  Feels that she is much more grounded currently and reports that she will definitely be back in touch as needed.  Goal review and progress/challenges noted with patient.  Patient is feeling more confident and at peace after the breakthrough in relationships with her 2 sisters.  Will call back as needed.   Kelleen Patee, LCSW

## 2023-08-29 ENCOUNTER — Other Ambulatory Visit (HOSPITAL_COMMUNITY): Payer: Self-pay

## 2023-09-03 ENCOUNTER — Other Ambulatory Visit (HOSPITAL_COMMUNITY): Payer: Self-pay

## 2023-09-09 ENCOUNTER — Ambulatory Visit: Admitting: Neurology

## 2023-09-09 ENCOUNTER — Telehealth (INDEPENDENT_AMBULATORY_CARE_PROVIDER_SITE_OTHER): Admitting: Neurology

## 2023-09-09 ENCOUNTER — Encounter: Payer: Self-pay | Admitting: Neurology

## 2023-09-09 ENCOUNTER — Other Ambulatory Visit: Payer: Self-pay | Admitting: Primary Care

## 2023-09-09 ENCOUNTER — Other Ambulatory Visit (HOSPITAL_COMMUNITY): Payer: Self-pay

## 2023-09-09 DIAGNOSIS — G8928 Other chronic postprocedural pain: Secondary | ICD-10-CM

## 2023-09-09 DIAGNOSIS — M5382 Other specified dorsopathies, cervical region: Secondary | ICD-10-CM

## 2023-09-09 DIAGNOSIS — R29898 Other symptoms and signs involving the musculoskeletal system: Secondary | ICD-10-CM | POA: Diagnosis not present

## 2023-09-09 DIAGNOSIS — G43709 Chronic migraine without aura, not intractable, without status migrainosus: Secondary | ICD-10-CM | POA: Diagnosis not present

## 2023-09-09 DIAGNOSIS — G8929 Other chronic pain: Secondary | ICD-10-CM

## 2023-09-09 DIAGNOSIS — Z9889 Other specified postprocedural states: Secondary | ICD-10-CM

## 2023-09-09 DIAGNOSIS — K2210 Ulcer of esophagus without bleeding: Secondary | ICD-10-CM

## 2023-09-09 DIAGNOSIS — M542 Cervicalgia: Secondary | ICD-10-CM | POA: Diagnosis not present

## 2023-09-09 MED ORDER — PREGABALIN 50 MG PO CAPS: ORAL_CAPSULE | ORAL | 6 refills | Status: DC

## 2023-09-09 NOTE — Patient Instructions (Signed)
 Start LyricaPregabalin Capsules What is this medication? PREGABALIN (pre GAB a lin) treats nerve pain. It may also be used to prevent and control seizures in people with epilepsy. It works by calming overactive nerves in your body. This medicine may be used for other purposes; ask your health care provider or pharmacist if you have questions. COMMON BRAND NAME(S): Lyrica What should I tell my care team before I take this medication? They need to know if you have any of these conditions: Heart failure Kidney disease Lung disease Substance use disorder Suicidal thoughts, plans or attempt by you or a family member An unusual or allergic reaction to pregabalin, other medications, foods, dyes, or preservatives Pregnant or trying to get pregnant Breast-feeding How should I use this medication? Take this medication by mouth with water. Take it as directed on the prescription label at the same time every day. You can take it with or without food. If it upsets your stomach, take it with food. Keep taking it unless your care team tells you to stop. A special MedGuide will be given to you by the pharmacist with each prescription and refill. Be sure to read this information carefully each time. Talk to your care team about the use of this medication in children. While it may be prescribed for children as young as 1 month for selected conditions, precautions do apply. Overdosage: If you think you have taken too much of this medicine contact a poison control center or emergency room at once. NOTE: This medicine is only for you. Do not share this medicine with others. What if I miss a dose? If you miss a dose, take it as soon as you can. If it is almost time for your next dose, take only that dose. Do not take double or extra doses. What may interact with this medication? This medication may interact with the following: Alcohol Antihistamines for allergy, cough, and cold Certain medications for anxiety  or sleep Certain medications for blood pressure, heart disease Certain medications for depression like amitriptyline , fluoxetine, sertraline  Certain medications for diabetes, like pioglitazone, rosiglitazone Certain medications for seizures like phenobarbital, primidone General anesthetics like halothane, isoflurane, methoxyflurane, propofol  Medications that relax muscles for surgery Opioid medications for pain Phenothiazines like chlorpromazine, mesoridazine, prochlorperazine , thioridazine This list may not describe all possible interactions. Give your health care provider a list of all the medicines, herbs, non-prescription drugs, or dietary supplements you use. Also tell them if you smoke, drink alcohol, or use illegal drugs. Some items may interact with your medicine. What should I watch for while using this medication? Visit your care team for regular checks on your progress. Tell your care team if your symptoms do not start to get better or if they get worse. Do not suddenly stop taking this medication. You may develop a severe reaction. Your care team will tell you how much medication to take. If your care team wants you to stop the medication, the dose may be slowly lowered over time to avoid any side effects. This medication may affect your coordination, reaction time, or judgment. Do not drive or operate machinery until you know how this medication affects you. Sit up or stand slowly to reduce the risk of dizzy or fainting spells. Drinking alcohol with this medication can increase the risk of these side effects. If you or your family notice any changes in your behavior, such as new or worsening depression, thoughts of harming yourself, anxiety, other unusual or disturbing thoughts, or memory loss, call  your care team right away. Wear a medical ID bracelet or chain if you are taking this medication for seizures. Carry a card that describes your condition. List the medications and doses you  take on the card. This medication may make it more difficult to father a child. Talk to your care team if you are concerned about your fertility. What side effects may I notice from receiving this medication? Side effects that you should report to your care team as soon as possible: Allergic reactions or angioedema--skin rash, itching, hives, swelling of the face, eyes, lips, tongue, arms, or legs, trouble swallowing or breathing Blurry vision Thoughts of suicide or self-harm, worsening mood, feelings of depression Trouble breathing Side effects that usually do not require medical attention (report to your care team if they continue or are bothersome): Dizziness Drowsiness Dry mouth Nausea Swelling of the ankles, feet, hands Vomiting Weight gain This list may not describe all possible side effects. Call your doctor for medical advice about side effects. You may report side effects to FDA at 1-800-FDA-1088. Where should I keep my medication? Keep out of the reach of children and pets. This medication can be abused. Keep it in a safe place to protect it from theft. Do not share it with anyone. It is only for you. Selling or giving away this medication is dangerous and against the law. Store at [employer] [address] (77 degrees F). Get rid of any unused medication after the expiration date. This medication may cause harm and death if it is taken by other adults, children, or pets. It is important to get rid of the medication as soon as you no longer need it, or it is expired. You can do this in two ways: Take the medication to a medication take-back program. Check with your pharmacy or law enforcement to find a location. If you cannot return the medication, check the label or package insert to see if the medication should be thrown out in the garbage or flushed down the toilet. If you are not sure, ask your care team. If it is safe to put it in the trash, take the medication out of the container. Mix the  medication with cat litter, dirt, coffee grounds, or other unwanted substance. Seal the mixture in a bag or container. Put it in the trash. NOTE: This sheet is a summary. It may not cover all possible information. If you have questions about this medicine, talk to your doctor, pharmacist, or health care provider.  2024 [employer]/Gold Standard (2021-01-21 00:00:00) Pain Management

## 2023-09-09 NOTE — Progress Notes (Addendum)
 [facility]    Provider:  Dr Tresia Fruit Referring Provider: Gabriel John, NP Primary Care Physician:  Gabriel John, NP   CC:  Chronic migraines  Virtual Visit via Video Note  I connected with [name] on 09/09/23 at  1:30 PM EDT by a video enabled telemedicine application and verified that I am speaking with the correct person using two identifiers.  Location: Patient: home Provider: office   I discussed the limitations of evaluation and management by telemedicine and the availability of in person appointments. The patient expressed understanding and agreed to proceed.   Follow Up Instructions:    I discussed the assessment and treatment plan with the patient. The patient was provided an opportunity to ask questions and all were answered. The patient agreed with the plan and demonstrated an understanding of the instructions.   The patient was advised to call back or seek an in-person evaluation if the symptoms worsen or if the condition fails to improve as anticipated.  I provided 30 minutes of non-face-to-face time during this encounter.   Glory Larsen, MD   09/09/2023: She had the flu for several months. Her headaches are feeling better. She has had surgeries on her neck and was told she has the neck "of a 73 year old". Her neck hurts, up at the top near the skull base, chronic pain for years in the neck and worsening, she has had surgeries, she has been wearing a neck brace non stop, she has a Scientist, clinical (histocompatibility and immunogenetics) for her muscles and her husband uses it on her neck every day, she has started meditation. She was in pain management with Dr. Joanette Moynahan for many years and having injections in the neck. Previous C3 through C7 cervical fusion. She has weakness in the arms. We will order MRI of the cervical spine. She doesn't need to botox  anymore. At this time will hold of on botox .   Patient complains of symptoms per HPI as well as the following symptoms: none  . Pertinent negatives and positives per HPI. All others negative   09/09/2023: SInce last seen for office visit, patient has been getting regular botox  for migraines every 3 months. Since then we have sent her for dry needling for cervical myofascial pain, in addition to the Botox  she has been on Ajovy  every 30 days injection.  She was switched to Emgality .  She did fairly well through 2024, in April 2024 she was given Nurtec samples but had not needed them and she was feeling well without concerns however prior to that she had stated she had 25 migraine days in the past month in January 2024 and the Botox  was only working for about 8 weeks.  In July 2024 she stated her headaches had gotten worse nearly daily headaches for the past 12 weeks taking ibuprofen Advil and Excedrin multiple times a day Nurtec samples were effective.  In October 2024 she again reported minimal improvement in migraine frequency Botox  only being effective for about 3 weeks nearly basis of migraines Nurtec was approved but again remained expensive.  In March 2025 she stated no significant improvement for the past 9 months, she has had other health issues likely contributing, but Botox  has not been helpful, near daily headaches most all migrainous and significant neck pain.  She was seeing Dr. Joanette Moynahan in neurosurgery.  They discussed Vyepti.  Medications tried that can be used in migraine/headache management or neck pain/cervical myofascial pain, greater than 3 months include: Lifestyle modification, headache  diaries, better sleep hygiene, exercise, management of migraine triggers,  Amovig (ineffective and weight gain) 08/2018 switched to Ajovy  07/2019 (works well) but expensive and changed to emgality  also too expensive, Cymbalta , Topamax  (side effects), Depakote . Rizatriptan  (works well), Nurtec (works well), Imitrex  (ineffective), Ubrelvy  (ineffective), Zofran , Zyprexa . Ajovy , various analgesics/NSAIDs, amitriptyline , baclofen, Botox ,  gabapentin , Toradol  injections, lisinopril , magnesium , Robaxin , Medrol  Dosepak's, Reglan , Zyprexa , Zofran , prednisone  tablets, Phenergan , propranolol , Seroquel, Nurtec, Maxalt , Requip, Zoloft , Imitrex , Zanaflex, Topamax , Qudexy , trazodone , Ubrelvy , Nurtec helpful but expensive, Viibryd, ziprasidone,   Interval history: This is a patient with chronic intractable headaches. Botox  has reduced frequency by 50% but she still has 15 headache days a month (did have 30 heahdache days prior to botox ). Has had botox  > 3 times still continues, has started Aimovig . She has had 3 injections of CGRP. The headaches are getting worse, lots of stress in her life. The stress is a lot better. Left side of the head, and neck. She is on Cymbalta . She doesn't snore at night, she has had negative sleep evals. She is sleeping well at night and feels rested.   Preventatives tired: Depakote , Qudexy , cymbalta (anti-depressant), topamax (anti-epileptic) this year, had side effects to topamax . Takes imitrex  acutely. Tried zofran  for nausea. Took lisinopril  in the past which did not help her headaches (blood pressure medication),   Botox  3 times. Tried Depakote  went up to 1500mg , Then Qudexy . Then generic Topiramate . Did not work/tolerate. No OTC medications. 90% on the left.  Was diagnosed with IBS and cataracts recently. She clenches her teeth at night, she uses her cpap at night. Doesn't drink a lot of caffeine daily. She has considerable neck pain and weakness, anterolisthesis of the neck, rounding of the shoulders, patient needs physical therapy for cervical myofascial pain, TMJ, forward posture, exercising for scapular stabilization, pectoral stretching and rhomboid strengthening.   HPI:  [name] is a 73 y.o. female here as a referral from Dr. Comer Decamp for migraines. Past medical history of anxiety, depression, insomnia, osteoporosis, chronic pain, multiple allergies, NSAID-induced gastritis, hypertension, hyperlipidemia,  migraine. She has had "stress headaches" all her life denies migraines. Sister and mother with migraine. Last year she started having new headaches shooting pain in her head all over and MRI was negative. Dr. O'toole did injections to try and alleviate the headaches. The shooting pain started in the back of the head on the left but now gets shooting pain all over the head including the forehead. Last year in February she woke up with severe headache an went to an acupuncture.    For the last 7 months she has had migraines that are refractory to medications, she cannot stand light or noise, had a terrible one in February. Her stress headaches are pressure in the temples and across the forehead. The migraines  Have woken her up, with light sensitivity, sound sensitivity, nausea, she vomits, she has started waking up with headaches progressively worsening in the morning she wakes up once a month or every 3 weeks with a headache and they can be debilitating. She has headaches every day tension type no light or sound sensitivity just pressure around the temples and forehead. The migraines that wake her up kept her at home all day. She continues to have shooting pain and tension tyope headaches and lots of stress in the neck. She does not take OTC medication, no medication overuse. Excedrin helped when she had the migraine. She has 8-10 migraine days a mont, migraines can  last up to 24 hours with  daily 30/30 days a month of headachea. No medication overuse. No aura.    Failed: cymbalta (anti-depressant), topamax (anti-epileptic) this year, had side effects to topamax . Takes imitrex  acutely. Tried zofran  for nausea. Took lisinopril  in the past which did not help her headaches (blood pressure medication), Depakote , Botox  x 3   Reviewed notes, labs and imaging from outside physicians, which showed:    Hemoglobin A1c 5. 12/22/2015, BUN 18 and creatinine 0.69 in May 2017, TSH 1.8 in April 2016.   Personally reviewed  MRI of the brain images and agree with the following 11/07/2014: No evidence for acute infarction, hemorrhage, mass lesion, hydrocephalus, or extra-axial fluid. Normal for age cerebral volume. No significant white matter disease. Flow voids are maintained throughout the carotid, basilar, and vertebral arteries. There are no areas of chronic hemorrhage.   Pituitary and cerebellar tonsils unremarkable. Previous cervical fusion extends cephalad is pars C3. Mild disc space narrowing C2-C3 and mild pannus without neural impingement.   Post infusion, no abnormal enhancement of the brain or meninges.   Incompletely evaluated on this routine brain study is a T2 hyperintense lesion within the LEFT paramedian pituitary. This measures approximately 5 mm in size. It appears fairly cystic, and is unchanged from 2011. 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 If there is evidence for elevated prolactin, MR of the pituitary should be performed.   Visualized calvarium, skull base, and upper cervical osseous structures unremarkable. Scalp and extracranial soft tissues, orbits, sinuses, and mastoids show no acute process.   IMPRESSION: Stable MR brain.  No acute intracranial findings.   Previous C3 through C7 cervical fusion incompletely evaluated but could serve as a source of headaches. Correlate clinically.   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 (stress)  Vaping Use   Vaping status: Never Used  Substance and Sexual Activity   Alcohol use: Not Currently    Alcohol/week: 1.0 standard drink of alcohol    Types: 1 Glasses of wine per week   Drug use: No   Sexual activity: Not Currently    Birth control/protection: None  Other Topics Concern   Not on file  Social History Narrative   Lives with husband   Caffeine use: 1 cup or less    Right handed   Social Drivers of Corporate investment banker Strain: Low Risk  (12/15/2022)   Overall Financial Resource Strain (CARDIA)    Difficulty of Paying Living Expenses: Not hard at all  Food Insecurity: No Food Insecurity (07/05/2023)   Hunger Vital Sign    Worried About Running Out of Food in the Last Year: Never true    Ran Out of Food in the Last Year: Never true  Transportation Needs: No Transportation Needs (07/05/2023)  PRAPARE - Administrator, Civil Service (Medical): No    Lack of Transportation (Non-Medical): No  Physical Activity: Sufficiently Active (12/15/2022)   Exercise Vital Sign    Days of Exercise per Week: 5 days    Minutes of Exercise per Session: 70 min  Stress: No Stress Concern Present (12/15/2022)   [employer] of Occupational Health - Occupational Stress Questionnaire    Feeling of Stress : Not at all  Social Connections: Moderately Isolated (07/05/2023)   Social Connection and Isolation Panel [NHANES]    Frequency of Communication with Friends and Family: Three times a week    Frequency of Social Gatherings with Friends and Family: Never    Attends Religious Services: Never    Database administrator or Organizations: No    Attends Banker Meetings: Never    Marital Status: Married  Catering manager Violence: Not At Risk (07/05/2023)   Humiliation, Afraid, Rape, and Kick questionnaire    Fear of Current or  Ex-Partner: No    Emotionally Abused: No    Physically Abused: No    Sexually Abused: No    Family History  Problem Relation Age of Onset   Bone cancer Mother    Hypertension Other    Allergic rhinitis Neg Hx    Asthma Neg Hx    Urticaria Neg Hx    Colon cancer Neg Hx    Colon polyps Neg Hx    Esophageal cancer Neg Hx    Rectal cancer Neg Hx    Stomach cancer Neg Hx    Breast cancer Neg Hx     Past Medical History:  Diagnosis Date   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   Anxiety    Asthma    as a child   CAP (community acquired pneumonia)    Cataract 2018   removed both eyes   COLONIC POLYPS, HX OF 11/15/2009   DEPRESSION 11/14/2009   GASTROJEJ ULCR UNS ACUT/CHRN W/O HEMOR PERF/OBST 11/06/2009   Generalized abdominal pain 12/02/2016   GERD (gastroesophageal reflux disease)    not on meds   Headache(784.0) 11/15/2009   Heart murmur    never bothered pt. always been told this   HEART MURMUR, HX OF 11/15/2009   HIATAL HERNIA 10/08/2009   Large   HIP FRACTURE, LEFT 11/14/2009   History of hiatal hernia    HYPERLIPIDEMIA 11/14/2009   HYPERTENSION 11/14/2009   Intractable nausea and vomiting 07/05/2023   OSTEOPOROSIS 11/14/2009   Prolonged Q-T interval on ECG 09/02/2021   WEIGHT GAIN 01/10/2010    Past Surgical History:  Procedure Laterality Date   APPENDECTOMY     AUGMENTATION MAMMAPLASTY Bilateral    BACK SURGERY  2003   Bleeding ulcers  10/2009   Broken Hip Left 06/2001   CATARACT EXTRACTION Bilateral 01/2017   COLONOSCOPY N/A 12/30/2016   Procedure: COLONOSCOPY WITH PROPOFOL ;  Surgeon: Joyce Nixon, MD;  Location: [facility];  Service: Endoscopy;  Laterality: N/A;   COLONOSCOPY  2011   inadequate prep, polyps   GASTROSTOMY N/A 09/15/2021   Procedure: INSERTION OF GASTROSTOMY TUBE;  Surgeon: Adalberto Acton, MD;  Location: [facility];  Service: General;  Laterality: N/A;   NECK SURGERY  2004 & 2006   x's 2   POLYPECTOMY     RECTAL PROLAPSE REPAIR  01/2017    XI ROBOTIC ASSISTED PARAESOPHAGEAL HERNIA REPAIR N/A 09/15/2021   Procedure: XI ROBOTIC ASSISTED PARAESOPHAGEAL HERNIA REPAIR WITH GASTROPEXY;  Surgeon: Adalberto Acton, MD;  Location:  [facility];  Service: General;  Laterality: N/A;    Current Outpatient Medications  Medication Sig Dispense Refill   pregabalin (LYRICA) 50 MG capsule Start with twice daily and if no side effects in 2 weeks can increase to 3x a day for chronic pain 90 capsule 6   CALCIUM  PO Take 1 tablet by mouth daily.      lisinopril  (ZESTRIL ) 20 MG tablet TAKE 1 TABLET BY MOUTH EVERY DAY FOR BLOOD PRESSURE 90 tablet 2   Multiple Vitamins-Minerals (MULTIVITAMIN PO) Take 1 tablet by mouth daily.      pantoprazole  (PROTONIX ) 40 MG tablet Take 1 tablet (40 mg total) by mouth 2 (two) times daily. (Patient taking differently: Take 40 mg by mouth daily.) 60 tablet 1   rosuvastatin  (CRESTOR ) 5 MG tablet TAKE 1 TABLET BY MOUTH EVERY DAY FOR CHOLESTEROL 90 tablet 2   sertraline  (ZOLOFT ) 100 MG tablet Take 2 tablets (200 mg total) by mouth daily. 180 tablet 0   traZODone  (DESYREL ) 100 MG tablet Take 1 tablet (100 mg total) by mouth at bedtime. 90 tablet 1   No current facility-administered medications for this visit.    Allergies as of 09/09/2023   (No Known Allergies)    Vitals: There were no vitals taken for this visit. Last Weight:  Wt Readings from Last 1 Encounters:  07/05/23 111 lb (50.3 kg)   Last Height:   Ht Readings from Last 1 Encounters:  07/05/23 5' (1.524 m)   Physical exam: Exam: Gen: NAD, conversant      CV: No palpitations or chest pain or SOB. VS: Breathing at a normal rate. Weight appears within normal limits. Not febrile. Eyes: Conjunctivae clear without exudates or hemorrhage  Neuro: Detailed Neurologic Exam  Speech:    Speech is normal; fluent and spontaneous with normal comprehension.  Cognition:    The patient is oriented to person, place, and time;     recent and remote memory intact;      language fluent;     normal attention, concentration, fund of knowledge Cranial Nerves:    The pupils are equal, round, and reactive to light. Visual fields are full Extraocular movements are intact.  The face is symmetric with normal sensation. The palate elevates in the midline. Hearing intact. Voice is normal. Shoulder shrug is normal. The tongue has normal motion without fasciculations.   Coordination: normal  Gait:    No abnormalities noted or reported  Motor Observation:   no involuntary movements noted. Tone:    Appears normal  Posture:    Posture is normal. normal erect    Strength:    Strength is anti-gravity and symmetric in the upper and lower limbs.      Sensation: intact to LT, no reports of numbness or tingling or paresthesias         Assessment/Plan:  73 year old with chronic migraines without aura no status not intractable and chronic neck pain; She was in pain management with Dr. Joanette Moynahan for many years, was on chronic opioids, was having injections in the neck. Previous C3 through C7 cervical fusion. She has weakness in the arms. We will order MRI of the cervical spine. Has failed multiple medications, been to PT, tried lifestyle choices, today wearing a neck brace at this time needs pain management.   Try Lyrica for pain, I have no other options for her she has to wait for pain management or her chronic neck pain States migraines are doing well and does not want  to continue botox .  Medications tried that can be used in migraine/headache management or neck pain/cervical myofascial pain, greater than 3 months include: Lifestyle modification, headache diaries, better sleep hygiene, exercise, management of migraine triggers,  Amovig (ineffective and weight gain) 08/2018 switched to Ajovy  07/2019 (works well) but expensive and changed to emgality  also too expensive, Cymbalta , Topamax  (side effects), Depakote . Rizatriptan  (works well), Nurtec (works well), Imitrex  (ineffective),  Ubrelvy  (ineffective), Zofran , Zyprexa . Ajovy , various analgesics/NSAIDs, amitriptyline , baclofen, Botox , gabapentin , Toradol  injections, lisinopril , magnesium , Robaxin , Medrol  Dosepak's, Reglan , Zyprexa , Zofran , prednisone  tablets, Phenergan , propranolol , Seroquel, Nurtec, Maxalt , Requip, Zoloft , Imitrex , Zanaflex, Topamax , Qudexy , trazodone , Ubrelvy , Nurtec helpful but expensive, Viibryd, ziprasidone,topical creams and patches  Orders Placed This Encounter  Procedures   MR CERVICAL SPINE WO CONTRAST   Ambulatory referral to Pain Clinic   Meds ordered this encounter  Medications   pregabalin (LYRICA) 50 MG capsule    Sig: Start with twice daily and if no side effects in 2 weeks can increase to 3x a day for chronic pain    Dispense:  90 capsule    Refill:  6      Aldona Amel, MD  [facility] [address] [place], [place] [address]  Phone [phone] Fax [phone]

## 2023-09-10 ENCOUNTER — Other Ambulatory Visit: Payer: Self-pay

## 2023-09-10 ENCOUNTER — Other Ambulatory Visit (HOSPITAL_COMMUNITY): Payer: Self-pay

## 2023-09-10 MED ORDER — PANTOPRAZOLE SODIUM 40 MG PO TBEC
40.0000 mg | DELAYED_RELEASE_TABLET | Freq: Two times a day (BID) | ORAL | 1 refills | Status: AC
  Filled 2023-09-10: qty 180, 90d supply, fill #0
  Filled 2023-12-04: qty 180, 90d supply, fill #1

## 2023-09-20 ENCOUNTER — Encounter: Payer: Self-pay | Admitting: Physical Medicine and Rehabilitation

## 2023-09-30 ENCOUNTER — Ambulatory Visit
Admission: RE | Admit: 2023-09-30 | Discharge: 2023-09-30 | Disposition: A | Discharge status: Home / Self Care | Source: Ambulatory Visit | Attending: Neurology

## 2023-09-30 DIAGNOSIS — R29898 Other symptoms and signs involving the musculoskeletal system: Secondary | ICD-10-CM

## 2023-09-30 DIAGNOSIS — M5382 Other specified dorsopathies, cervical region: Secondary | ICD-10-CM

## 2023-09-30 DIAGNOSIS — M542 Cervicalgia: Secondary | ICD-10-CM | POA: Diagnosis not present

## 2023-09-30 DIAGNOSIS — G8928 Other chronic postprocedural pain: Secondary | ICD-10-CM

## 2023-09-30 DIAGNOSIS — Z9889 Other specified postprocedural states: Secondary | ICD-10-CM | POA: Diagnosis not present

## 2023-10-04 ENCOUNTER — Ambulatory Visit: Payer: Self-pay | Admitting: Neurology

## 2023-10-04 ENCOUNTER — Encounter: Payer: Self-pay | Admitting: Neurology

## 2023-10-08 NOTE — Progress Notes (Deleted)
 Subjective:    Patient ID: [name], female    DOB: [birth year], 73 y.o.   MRN: [id number]  HPI Pain Inventory Average Pain {NUMBERS; 0-10:5044} Pain Right Now {NUMBERS; 0-10:5044} My pain is {PAIN DESCRIPTION:21022940}  In the last 24 hours, has pain interfered with the following? General activity {NUMBERS; 0-10:5044} Relation with others {NUMBERS; 0-10:5044} Enjoyment of life {NUMBERS; 0-10:5044} What TIME of day is your pain at its worst? {time of day:24191} Sleep (in general) {BHH GOOD/FAIR/POOR:22877}  Pain is worse with: {ACTIVITIES:21022942} Pain improves with: {PAIN IMPROVES NWGN:56213086} Relief from Meds: {NUMBERS; 0-10:5044}  {MOBILITY VHQ:46962952}  {FUNCTION:21022946}  {NEURO/PSYCH:21022948}  {CPRM PRIOR STUDIES:21022953}  {CPRM PHYSICIANS INVOLVED IN YOUR CARE:21022954}    Family History  Problem Relation Age of Onset   Bone cancer Mother    Hypertension Other    Allergic rhinitis Neg Hx    Asthma Neg Hx    Urticaria Neg Hx    Colon cancer Neg Hx    Colon polyps Neg Hx    Esophageal cancer Neg Hx    Rectal cancer Neg Hx    Stomach cancer Neg Hx    Breast cancer Neg Hx    Social History   Socioeconomic History   Marital status: Married    Spouse name: Myrtie Atkinson   Number of children: 3   Years of education: BA   Highest education level: Not on file  Occupational History   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 [employer]  Tobacco Use   Smoking status: Former    Current packs/day: 0.00    Types: Cigarettes    Start date: 05/05/1967    Quit date: 05/04/1982    Years since quitting: 41.4   Smokeless tobacco: Never   Tobacco comments:    Married, 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[place], 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 (stress)  Vaping Use   Vaping status: Never Used  Substance and Sexual Activity   Alcohol use: Not Currently    Alcohol/week: 1.0 standard drink of alcohol    Types: 1 Glasses of wine per week    Drug use: No   Sexual activity: Not Currently    Birth control/protection: None  Other Topics Concern   Not on file  Social History Narrative   Lives with husband   Caffeine use: 1 cup or less    Right handed   Social Drivers of Corporate investment banker Strain: Low Risk  (12/15/2022)   Overall Financial Resource Strain (CARDIA)    Difficulty of Paying Living Expenses: Not hard at all  Food Insecurity: No Food Insecurity (07/05/2023)   Hunger Vital Sign    Worried About Running Out of Food in the Last Year: Never true    Ran Out of Food in the Last Year: Never true  Transportation Needs: No Transportation Needs (07/05/2023)   PRAPARE - Administrator, Civil Service (Medical): No    Lack of Transportation (Non-Medical): No  Physical Activity: Sufficiently Active (12/15/2022)   Exercise Vital Sign    Days of Exercise per Week: 5 days    Minutes of Exercise per Session: 70 min  Stress: No Stress Concern Present (12/15/2022)   [employer] of Occupational Health - Occupational Stress Questionnaire    Feeling of Stress : Not at all  Social Connections: Moderately Isolated (07/05/2023)   Social Connection and Isolation Panel [NHANES]    Frequency of Communication with Friends and Family: Three times a week  Frequency of Social Gatherings with Friends and Family: Never    Attends Religious Services: Never    Database administrator or Organizations: No    Attends Engineer, structural: Never    Marital Status: Married   Past Surgical History:  Procedure Laterality Date   APPENDECTOMY     AUGMENTATION MAMMAPLASTY Bilateral    BACK SURGERY  2003   Bleeding ulcers  10/2009   Broken Hip Left 06/2001   CATARACT EXTRACTION Bilateral 01/2017   COLONOSCOPY N/A 12/30/2016   Procedure: COLONOSCOPY WITH PROPOFOL ;  Surgeon: Joyce Nixon, MD;  Location: [facility];  Service: Endoscopy;  Laterality: N/A;   COLONOSCOPY  2011   inadequate prep, polyps   GASTROSTOMY  N/A 09/15/2021   Procedure: INSERTION OF GASTROSTOMY TUBE;  Surgeon: Adalberto Acton, MD;  Location: [facility];  Service: General;  Laterality: N/A;   NECK SURGERY  2004 & 2006   x's 2   POLYPECTOMY     RECTAL PROLAPSE REPAIR  01/2017   XI ROBOTIC ASSISTED PARAESOPHAGEAL HERNIA REPAIR N/A 09/15/2021   Procedure: XI ROBOTIC ASSISTED PARAESOPHAGEAL HERNIA REPAIR WITH GASTROPEXY;  Surgeon: Adalberto Acton, MD;  Location: [facility];  Service: General;  Laterality: N/A;   Past Medical History:  Diagnosis Date   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   Anxiety    Asthma    as a child   CAP (community acquired pneumonia)    Cataract 2018   removed both eyes   COLONIC POLYPS, HX OF 11/15/2009   DEPRESSION 11/14/2009   GASTROJEJ ULCR UNS ACUT/CHRN W/O HEMOR PERF/OBST 11/06/2009   Generalized abdominal pain 12/02/2016   GERD (gastroesophageal reflux disease)    not on meds   Headache(784.0) 11/15/2009   Heart murmur    never bothered pt. always been told this   HEART MURMUR, HX OF 11/15/2009   HIATAL HERNIA 10/08/2009   Large   HIP FRACTURE, LEFT 11/14/2009   History of hiatal hernia    HYPERLIPIDEMIA 11/14/2009   HYPERTENSION 11/14/2009   Intractable nausea and vomiting 07/05/2023   OSTEOPOROSIS 11/14/2009   Prolonged Q-T interval on ECG 09/02/2021   WEIGHT GAIN 01/10/2010   There were no vitals taken for this visit.  Opioid Risk Score:   Fall Risk Score:  `1  Depression screen [facility] 2/9     12/15/2022    2:41 PM 06/12/2022    2:32 PM 04/07/2022   11:38 AM 10/28/2021   12:43 PM 10/24/2020    4:03 PM 03/26/2020   10:58 AM 03/16/2019    2:16 PM  Depression screen [facility] 2/9  Decreased Interest 0 0 0 0 0 0 0  Down, Depressed, Hopeless 0 0 0 0 0 0 0  PHQ - 2 Score 0 0 0 0 0 0 0  Altered sleeping   3  0 0 0  Tired, decreased energy   3  0 0 0  Change in appetite   3  0 0 0  Feeling bad or failure about yourself    0  0 0 0  Trouble concentrating   0  0 0 0  Moving slowly or  fidgety/restless   0  0 0 0  Suicidal thoughts   0  0 0 0  PHQ-9 Score   9  0 0 0  Difficult doing work/chores   Not difficult at all  Not difficult at all Not difficult at all Not difficult at all      Review of Systems  Objective:   Physical Exam        Assessment & Plan:

## 2023-10-11 ENCOUNTER — Encounter: Attending: Physical Medicine and Rehabilitation | Admitting: Physical Medicine and Rehabilitation

## 2023-10-11 ENCOUNTER — Encounter: Payer: Self-pay | Admitting: Physical Medicine and Rehabilitation

## 2023-10-11 VITALS — BP 99/63 | HR 66 | Ht 60.0 in | Wt 109.0 lb

## 2023-10-11 DIAGNOSIS — M7918 Myalgia, other site: Secondary | ICD-10-CM | POA: Diagnosis not present

## 2023-10-11 DIAGNOSIS — G8921 Chronic pain due to trauma: Secondary | ICD-10-CM | POA: Diagnosis not present

## 2023-10-11 MED ORDER — CYCLOBENZAPRINE HCL 10 MG PO TABS: 10.0000 mg | ORAL_TABLET | Freq: Three times a day (TID) | ORAL | 5 refills | Status: DC | PRN

## 2023-10-11 NOTE — Patient Instructions (Signed)
 Pt is a 73 yr old female with hx of HTN, HLD;  insomnia and Depression;  Migraines- hx of bleeding ulcers- and subacute neck pain on chronic neck pain   Sent for evaluation of neck pain;    Need to not wear neck soft collar more than 4 hours/day or will get weakness.   2.  Says has high tolerance for meds-    3.  Salonpas or another brands- wear patches at night- only works where you cover the skin- if they don't stick, use alcohol on your skin- do not use heat over the patch- 8-12 hours/day-  12 hours max of 24 hour period. Can do 7 days/week  4. Theracane-  amazon or online- videos on youtube how to use- hold pressure 2-4 minutes on the spots the hurt- work up pressure over 2 minutes- can go to 4 minutes- when muscle relaxes, you can stop- do not let go before 1 minute-   max 30-40 minutes/day- overall, for ALL trigger points- spread it out.  Demonstrated pressure on pt and husband   5. Can use vibrating massager- after used theracane-    6. FlexerilCyclobenzaprine- 10 mg  up to 3x/day- can cause sedation/sleepiness- and dry mouth-  can easily get tolerant to all muscle relaxants-  5 days/week. On days you take it, 3x/day.    7. Magnesium - 400- mg 1-3x/day- based on loose stool- natural muscle relaxant- it doesn't work as needed- so need to take daily- big pill fyi- used for stopping preterm labor at higher doses, fyi.    8. I get you for trigger point injections as soon as possible.     9. F/U ASAP in wait list- and in 3 months- trigger point injections.

## 2023-10-11 NOTE — Progress Notes (Signed)
 Subjective:    Patient ID: [name], female    DOB: 11-05-1950, 73 y.o.   MRN: [id number]  HPI   Pt is a 73 yr old female with hx of HTN, HLD;  insomnia and Depression;  Migraines- hx of bleeding ulcers- and subacute neck pain on chronic neck pain   Sent for evaluation of neck pain;     Dr Joanette Moynahan- has retired- was her Careers adviser- 2 neck surgeries and 1 lumbar spine- sounds like like surgery was lower cervical surgery. Was anterior incision. C3-C7 fusion.    Neck neck is really high- can hear it popping all the time- and pain radiates down her neck from it.  So sore, has to put hand behind head to get OOB or out of chair.  Started several months ago- no falls or near falls- no trauma;  Neck has been a problem all her life.  Hasn't had pain in this area before.  Wearing soft collar full time- no one told her to wear it, but thought would help for support.   Doesn't feel weak in arms- "not as strong as used to be" but doesn't feel weak.   With rotation, flexion/extension, and side bending, increases bending.   When sits, puts neck pillow behind head- and when lays down, pain somewhat better.   Feels like neck brace "stabilizes neck"  Sleeping well  Was getting Botox  for migraines Migraines have been better since this started.    Tried:  Lyrica - 50 mg TID- started 1 month ago.  "Has a really high pain tolerance".  Didn't try steroids- or other nerve pain meds Taking a lot of ASA- 3 250 mg ASA 3-4x/day- "every 4 hours"-   Sent here by Dr Tresia Fruit  MRI of cervical spine 09/30/23 IMPRESSION: MRI scan of cervical spine without contrast showing stable postoperative changes of anterior cervical fusion from C3-C7.  There are prominent spondylitic changes at C3-4 with central disc bulge and ligamentum flavum hypertrophy resulting in mild posterior canal narrowing and mild right greater than left foraminal narrowing.  There are also mild spondylitic changes at C2-3 and C7-T1 but  without significant compression.  Overall these changes appear significantly progressed at C3-4 compared to previous MRI from 11/02/2006     Pain Inventory Average Pain 10 Pain Right Now 10 My pain is sharp, stabbing, and aching  LOCATION OF PAIN  neck  BOWEL Number of stools per week: 3 Oral laxative use Yes  Type of laxative senna Enema or suppository use No  History of colostomy No  Incontinent No   BLADDER Normal In and out cath, frequency . Able to self cath No  Bladder incontinence No  Frequent urination No  Leakage with coughing No  Difficulty starting stream No  Incomplete bladder emptying No    Mobility how many minutes can you walk? All day ability to climb steps?  yes do you drive?  yes  Function not employed: date last employed 2020 retired  Neuro/Psych No problems in this area  Prior Studies New pt  Physicians involved in your care New pt   Family History  Problem Relation Age of Onset   Bone cancer Mother    Hypertension Other    Allergic rhinitis Neg Hx    Asthma Neg Hx    Urticaria Neg Hx    Colon cancer Neg Hx    Colon polyps Neg Hx    Esophageal cancer Neg Hx    Rectal cancer Neg Hx  Stomach cancer Neg Hx    Breast cancer Neg Hx    Social History   Socioeconomic History   Marital status: Married    Spouse name: Myrtie Atkinson   Number of children: 3   Years of education: BA   Highest education level: Not on file  Occupational History   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 Spa  Tobacco Use   Smoking status: Former    Current packs/day: 0.00    Types: Cigarettes    Start date: 05/05/1967    Quit date: 05/04/1982    Years since quitting: 41.4   Smokeless tobacco: Never   Tobacco comments:    Married, 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[place], 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 (stress)  Vaping Use   Vaping status: Never Used  Substance and Sexual Activity   Alcohol use: Not Currently    Alcohol/week:  1.0 standard drink of alcohol    Types: 1 Glasses of wine per week   Drug use: No   Sexual activity: Not Currently    Birth control/protection: None  Other Topics Concern   Not on file  Social History Narrative   Lives with husband   Caffeine use: 1 cup or less    Right handed   Social Drivers of Corporate investment banker Strain: Low Risk  (12/15/2022)   Overall Financial Resource Strain (CARDIA)    Difficulty of Paying Living Expenses: Not hard at all  Food Insecurity: No Food Insecurity (07/05/2023)   Hunger Vital Sign    Worried About Running Out of Food in the Last Year: Never true    Ran Out of Food in the Last Year: Never true  Transportation Needs: No Transportation Needs (07/05/2023)   PRAPARE - Administrator, Civil Service (Medical): No    Lack of Transportation (Non-Medical): No  Physical Activity: Sufficiently Active (12/15/2022)   Exercise Vital Sign    Days of Exercise per Week: 5 days    Minutes of Exercise per Session: 70 min  Stress: No Stress Concern Present (12/15/2022)   [employer] of Occupational Health - Occupational Stress Questionnaire    Feeling of Stress : Not at all  Social Connections: Moderately Isolated (07/05/2023)   Social Connection and Isolation Panel [NHANES]    Frequency of Communication with Friends and Family: Three times a week    Frequency of Social Gatherings with Friends and Family: Never    Attends Religious Services: Never    Database administrator or Organizations: No    Attends Engineer, structural: Never    Marital Status: Married   Past Surgical History:  Procedure Laterality Date   APPENDECTOMY     AUGMENTATION MAMMAPLASTY Bilateral    BACK SURGERY  2003   Bleeding ulcers  10/2009   Broken Hip Left 06/2001   CATARACT EXTRACTION Bilateral 01/2017   COLONOSCOPY N/A 12/30/2016   Procedure: COLONOSCOPY WITH PROPOFOL ;  Surgeon: Joyce Nixon, MD;  Location: [facility];  Service: Endoscopy;  Laterality:  N/A;   COLONOSCOPY  2011   inadequate prep, polyps   GASTROSTOMY N/A 09/15/2021   Procedure: INSERTION OF GASTROSTOMY TUBE;  Surgeon: Adalberto Acton, MD;  Location: [facility];  Service: General;  Laterality: N/A;   NECK SURGERY  2004 & 2006   x's 2   POLYPECTOMY     RECTAL PROLAPSE REPAIR  01/2017   XI ROBOTIC ASSISTED PARAESOPHAGEAL HERNIA REPAIR N/A 09/15/2021   Procedure: XI ROBOTIC  ASSISTED PARAESOPHAGEAL HERNIA REPAIR WITH GASTROPEXY;  Surgeon: Adalberto Acton, MD;  Location: [facility];  Service: General;  Laterality: N/A;   Past Medical History:  Diagnosis Date   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   Anxiety    Asthma    as a child   CAP (community acquired pneumonia)    Cataract 2018   removed both eyes   COLONIC POLYPS, HX OF 11/15/2009   DEPRESSION 11/14/2009   GASTROJEJ ULCR UNS ACUT/CHRN W/O HEMOR PERF/OBST 11/06/2009   Generalized abdominal pain 12/02/2016   GERD (gastroesophageal reflux disease)    not on meds   Headache(784.0) 11/15/2009   Heart murmur    never bothered pt. always been told this   HEART MURMUR, HX OF 11/15/2009   HIATAL HERNIA 10/08/2009   Large   HIP FRACTURE, LEFT 11/14/2009   History of hiatal hernia    HYPERLIPIDEMIA 11/14/2009   HYPERTENSION 11/14/2009   Intractable nausea and vomiting 07/05/2023   OSTEOPOROSIS 11/14/2009   Prolonged Q-T interval on ECG 09/02/2021   WEIGHT GAIN 01/10/2010   BP 99/63   Pulse 66   Ht 5' (1.524 m)   Wt 109 lb (49.4 kg)   SpO2 96%   BMI 21.29 kg/m   Opioid Risk Score:   Fall Risk Score:  `1  Depression screen [facility] 2/9     10/11/2023    8:57 AM 12/15/2022    2:41 PM 06/12/2022    2:32 PM 04/07/2022   11:38 AM 10/28/2021   12:43 PM 10/24/2020    4:03 PM 03/26/2020   10:58 AM  Depression screen PHQ 2/9  Decreased Interest 0 0 0 0 0 0 0  Down, Depressed, Hopeless 0 0 0 0 0 0 0  PHQ - 2 Score 0 0 0 0 0 0 0  Altered sleeping 0   3  0 0  Tired, decreased energy 0   3  0 0  Change in appetite 0   3  0 0   Feeling bad or failure about yourself  0   0  0 0  Trouble concentrating 0   0  0 0  Moving slowly or fidgety/restless 0   0  0 0  Suicidal thoughts 0   0  0 0  PHQ-9 Score 0   9  0 0  Difficult doing work/chores    Not difficult at all  Not difficult at all Not difficult at all     Review of Systems  Musculoskeletal:  Positive for neck pain.  All other systems reviewed and are negative.     Objective:   Physical Exam  Awake, alert, appropriate, wearing soft collar; no Assistive device, accompanied by husband, NAD MSK: 5/5 except R deltoid 4+/5 and FA 4+/5 B/L   Neuro: No light touch sensory deficits DTR's 2+ B/L     Assessment & Plan:   Pt is a 73 yr old female with hx of HTN, HLD;  insomnia and Depression;  Migraines- hx of bleeding ulcers- and subacute neck pain on chronic neck pain   Sent for evaluation of neck pain;    Need to not wear neck soft collar more than 4 hours/day or will get weakness.   2.  Says has high tolerance for meds-    3.  Salonpas or another brands- wear patches at night- only works where you cover the skin- if they don't stick, use alcohol on your skin- do not use heat over the patch- 8-12 hours/day-  12  hours max of 24 hour period. Can do 7 days/week  4. Theracane-  amazon or online- videos on youtube how to use- hold pressure 2-4 minutes on the spots the hurt- work up pressure over 2 minutes- can go to 4 minutes- when muscle relaxes, you can stop- do not let go before 1 minute-   max 30-40 minutes/day- overall, for ALL trigger points- spread it out.  Demonstrated pressure on pt and husband   5. Can use vibrating massager- after used theracane-    6. FlexerilCyclobenzaprine- 10 mg  up to 3x/day- can cause sedation/sleepiness- and dry mouth-  can easily get tolerant to all muscle relaxants-  5 days/week. On days you take it, 3x/day.    7. Magnesium - 400- mg 1-3x/day- based on loose stool- natural muscle relaxant- it doesn't work as needed-  so need to take daily- big pill fyi- used for stopping preterm labor at higher doses, fyi.    8. I get you for trigger point injections as soon as possible.     9. F/U ASAP in wait list- and in 3 months- trigger point injections.    I spent a total of 46   minutes on total care today- >50% coordination of care- due to education on HEP; meds and trigger point injections-

## 2023-10-12 ENCOUNTER — Other Ambulatory Visit: Payer: Self-pay | Admitting: Neurology

## 2023-10-12 DIAGNOSIS — G8928 Other chronic postprocedural pain: Secondary | ICD-10-CM

## 2023-10-13 ENCOUNTER — Telehealth: Payer: Self-pay | Admitting: Neurology

## 2023-10-13 NOTE — Telephone Encounter (Signed)
 Referral for clinic refax due to neurologist request for a earlier appointment with another facility. [facility]. Phone: [phone], Fax: [phone]

## 2023-10-19 DIAGNOSIS — H43813 Vitreous degeneration, bilateral: Secondary | ICD-10-CM | POA: Diagnosis not present

## 2023-10-21 ENCOUNTER — Ambulatory Visit: Admitting: Family Medicine

## 2023-10-25 ENCOUNTER — Other Ambulatory Visit: Payer: Self-pay | Admitting: Primary Care

## 2023-10-25 DIAGNOSIS — Z1231 Encounter for screening mammogram for malignant neoplasm of breast: Secondary | ICD-10-CM

## 2023-11-12 DIAGNOSIS — M542 Cervicalgia: Secondary | ICD-10-CM | POA: Diagnosis not present

## 2023-11-12 DIAGNOSIS — Z0000 Encounter for general adult medical examination without abnormal findings: Secondary | ICD-10-CM | POA: Diagnosis not present

## 2023-11-12 DIAGNOSIS — M129 Arthropathy, unspecified: Secondary | ICD-10-CM | POA: Diagnosis not present

## 2023-11-12 DIAGNOSIS — E559 Vitamin D deficiency, unspecified: Secondary | ICD-10-CM | POA: Diagnosis not present

## 2023-11-12 DIAGNOSIS — Z1159 Encounter for screening for other viral diseases: Secondary | ICD-10-CM | POA: Diagnosis not present

## 2023-11-12 DIAGNOSIS — Z79899 Other long term (current) drug therapy: Secondary | ICD-10-CM | POA: Diagnosis not present

## 2023-11-12 DIAGNOSIS — Z6820 Body mass index (BMI) 20.0-20.9, adult: Secondary | ICD-10-CM | POA: Diagnosis not present

## 2023-11-12 DIAGNOSIS — Z131 Encounter for screening for diabetes mellitus: Secondary | ICD-10-CM | POA: Diagnosis not present

## 2023-11-12 DIAGNOSIS — M549 Dorsalgia, unspecified: Secondary | ICD-10-CM | POA: Diagnosis not present

## 2023-11-17 DIAGNOSIS — Z79899 Other long term (current) drug therapy: Secondary | ICD-10-CM | POA: Diagnosis not present

## 2023-11-19 DIAGNOSIS — R7303 Prediabetes: Secondary | ICD-10-CM | POA: Diagnosis not present

## 2023-11-19 DIAGNOSIS — Z79899 Other long term (current) drug therapy: Secondary | ICD-10-CM | POA: Diagnosis not present

## 2023-11-19 DIAGNOSIS — Z6820 Body mass index (BMI) 20.0-20.9, adult: Secondary | ICD-10-CM | POA: Diagnosis not present

## 2023-11-19 DIAGNOSIS — M542 Cervicalgia: Secondary | ICD-10-CM | POA: Diagnosis not present

## 2023-11-19 DIAGNOSIS — F1290 Cannabis use, unspecified, uncomplicated: Secondary | ICD-10-CM | POA: Diagnosis not present

## 2023-11-19 DIAGNOSIS — M549 Dorsalgia, unspecified: Secondary | ICD-10-CM | POA: Diagnosis not present

## 2023-11-26 ENCOUNTER — Other Ambulatory Visit: Payer: Self-pay | Admitting: Primary Care

## 2023-11-26 ENCOUNTER — Ambulatory Visit
Admission: RE | Admit: 2023-11-26 | Discharge: 2023-11-26 | Disposition: A | Discharge status: Home / Self Care | Source: Ambulatory Visit | Attending: Primary Care | Admitting: Primary Care

## 2023-11-26 DIAGNOSIS — Z1231 Encounter for screening mammogram for malignant neoplasm of breast: Secondary | ICD-10-CM

## 2023-11-28 ENCOUNTER — Encounter: Payer: Self-pay | Admitting: Physical Medicine and Rehabilitation

## 2023-12-01 ENCOUNTER — Ambulatory Visit: Payer: Self-pay | Admitting: Primary Care

## 2023-12-08 ENCOUNTER — Encounter: Attending: Physical Medicine and Rehabilitation | Admitting: Physical Medicine and Rehabilitation

## 2023-12-08 ENCOUNTER — Encounter: Payer: Self-pay | Admitting: Physical Medicine and Rehabilitation

## 2023-12-08 VITALS — BP 108/69 | HR 79 | Ht 60.0 in | Wt 106.0 lb

## 2023-12-08 DIAGNOSIS — M7918 Myalgia, other site: Secondary | ICD-10-CM | POA: Diagnosis not present

## 2023-12-08 MED ORDER — LIDOCAINE HCL 1 % IJ SOLN
6.0000 mL | Freq: Once | INTRAMUSCULAR | Status: AC
Start: 2023-12-08 — End: 2023-12-08
  Administered 2023-12-08: 6 mL

## 2023-12-08 NOTE — Progress Notes (Signed)
 Pt is a 73 yr old female with hx of HTN, HLD;  insomnia and Depression;  Migraines- hx of bleeding ulcers- and subacute neck pain on chronic neck pain- with myofascial pain.     Sent for f/u  of neck pain   Needs trP injections,   Pain so much more than last visit TrP work didn't work-  theracane was no effective.   Plan:  Patient here for trigger point injections for  Consent done and on chart.  Cleaned areas with alcohol and injected using a 27 gauge 1.5 inch needle  Injected 6cc- none wasted Using 1% Lidocaine  with no EPI  Upper traps- B/L x2 Levators- B?L  Posterior scalenes Middle scalenes- B/L  Splenius Capitus- B/L x2 Pectoralis Major- B/L  Rhomboids- B/L x3 Infraspinatus- B/L  Teres Major/minor Thoracic paraspinals Lumbar paraspinals Other injections-    Patient's level of pain prior was 12-15 Current level of pain after injections is 4-5  There was no bleeding or complications.  Patient was advised to drink a lot of water on day after injections to flush system Will have increased soreness for 12-48 hours after injections.  Can use Lidocaine  patches the day AFTER injections Can use theracane on day of injections in places didn't inject Can use heating pad 4-6 hours AFTER injections  2. Went over using theracane - youtube channels for theracanes- and they work by which muscles.   3. Drink a lot of water- today.   4.  Referral to PT_ for severe myofascial pain  5. F/U in 6 weeks- in September- make appt q6 weeks-    6. Integrative therapies- suggest for treatment as well of Trigger points.  I spent a total of    minutes on total care today- >50% coordination of care- due to

## 2023-12-08 NOTE — Patient Instructions (Addendum)
 Plan:  Patient here for trigger point injections for  Consent done and on chart.  Cleaned areas with alcohol and injected using a 27 gauge 1.5 inch needle  Injected 6cc- none wasted Using 1% Lidocaine  with no EPI  Upper traps- B/L x2 Levators- B?L  Posterior scalenes Middle scalenes- B/L  Splenius Capitus- B/L x2 Pectoralis Major- B/L  Rhomboids- B/L x3 Infraspinatus- B/L  Teres Major/minor Thoracic paraspinals Lumbar paraspinals Other injections-    Patient's level of pain prior was 12-15 Current level of pain after injections is 4-5  There was no bleeding or complications.  Patient was advised to drink a lot of water on day after injections to flush system Will have increased soreness for 12-48 hours after injections.  Can use Lidocaine  patches the day AFTER injections Can use theracane on day of injections in places didn't inject Can use heating pad 4-6 hours AFTER injections  2. Went over using theracane - youtube channels for theracanes- and they work by which muscles.   3. Drink a lot of water- today.   4.  Referral to PT_ for severe myofascial pain  5. F/U in 6 weeks- in September- make appt q6 weeks-  6. Integrative therapies- suggest for treatment as well of Trigger points.

## 2023-12-10 ENCOUNTER — Other Ambulatory Visit: Payer: Self-pay | Admitting: Adult Health

## 2023-12-10 DIAGNOSIS — F411 Generalized anxiety disorder: Secondary | ICD-10-CM

## 2023-12-12 ENCOUNTER — Other Ambulatory Visit: Payer: Self-pay | Admitting: Adult Health

## 2023-12-12 DIAGNOSIS — G4700 Insomnia, unspecified: Secondary | ICD-10-CM

## 2023-12-21 ENCOUNTER — Ambulatory Visit: Payer: PPO | Admitting: Adult Health

## 2023-12-21 ENCOUNTER — Encounter: Payer: Self-pay | Admitting: Adult Health

## 2023-12-21 DIAGNOSIS — F411 Generalized anxiety disorder: Secondary | ICD-10-CM

## 2023-12-21 DIAGNOSIS — G4700 Insomnia, unspecified: Secondary | ICD-10-CM | POA: Diagnosis not present

## 2023-12-21 MED ORDER — TRAZODONE HCL 100 MG PO TABS: 100.0000 mg | ORAL_TABLET | Freq: Every day | ORAL | 1 refills | Status: AC

## 2023-12-21 MED ORDER — SERTRALINE HCL 100 MG PO TABS: 200.0000 mg | ORAL_TABLET | Freq: Every day | ORAL | 1 refills | Status: AC

## 2023-12-21 NOTE — Progress Notes (Signed)
 [name] [id number] 1950-07-14 73 y.o.  Subjective:   Patient ID:  [name] is a 73 y.o. (DOB 03-24-1951) female.  Chief Complaint: No chief complaint on file.   HPI [name] presents to the office today for follow-up of GAD.  Describes mood today as ok. Pleasant. Denies tearfulness. Mood symptoms - denies depression, anxiety and irritability. Reports stable interest and motivation. Denies panic attacks. Reports some worry, rumination and over thinking. Denies obsessive thoughts or acts. Reports mood is stable. Stating I feel like I'm doing good. Feels like the Zoloft  is working well for her. Also doing a daily meditation - over 9 months. Taking medications as prescribed.  Energy levels stable. Active, has a regular exercise routine 5 days a week. Enjoys some usual interests and activities. Married. Lives with husband. Has 3 children - grandchildren. Spending time with family. Appetite adequate. Weight loss - 106 from 112 pounds - 60. Sleeps well most nights. Averages 8 hours.  Focus and concentration stable. Completing tasks. Managing aspects of household. Retired. Denies SI or HI.  Denies AH or VH. Denies self harm. Denies substance use.   Previous medication trials: Trazadone, Amitriptyline , Wellbutrin, Gabapentin  900mg  daily, Prozac 80mg  daily, Cymbalta  40mg  daily, Hydroxyzine  75mg  daily, Clonidine 0.2mg  daily and Lamictal 100mg  daily, Viibyrd 40mg  daily, Clonazepam , Buspar    AUDIT    Flowsheet Row Clinical Support from 10/28/2021 in [facility] Clinical Support from 10/24/2020 in [facility]  Alcohol Use Disorder Identification Test Final Score (AUDIT) 4 4   GAD-7    Flowsheet Row Office Visit from 06/24/2017 in [facility]  Total GAD-7 Score 10   PHQ2-9    Flowsheet Row Office Visit from 12/08/2023 in [facility]  Rehabilitation Office Visit from 10/11/2023 in [facility] Rehabilitation Clinical Support from 12/15/2022 in [facility] Office Visit from 06/12/2022 in [facility] Center Visit from 04/07/2022 in [facility]  PHQ-2 Total Score 0 0 0 0 0  PHQ-9 Total Score -- 0 -- -- 9   Flowsheet Row ED to Hosp-Admission (Discharged) from 07/05/2023 in [facility] ED from 06/27/2023 in [facility] Emergency Department at [facility] Admission (Discharged) from 09/15/2021 in [facility] [place] [facility]  C-SSRS RISK CATEGORY No Risk No Risk No Risk     Review of Systems:  Review of Systems  Musculoskeletal:  Negative for gait problem.  Neurological:  Negative for tremors.  Psychiatric/Behavioral:         Please refer to HPI    Medications: I have reviewed the patient's current medications.  Current Outpatient Medications  Medication Sig Dispense Refill   sertraline  (ZOLOFT ) 100 MG tablet TAKE 2 TABLETS BY MOUTH EVERY DAY 60 tablet 0   CALCIUM  PO Take 1 tablet by mouth daily.      cyclobenzaprine  (FLEXERIL ) 10 MG tablet Take 1 tablet (10 mg total) by mouth 3 (three) times daily as needed for muscle spasms. 90 tablet 5   lisinopril  (ZESTRIL ) 20 MG tablet TAKE 1 TABLET BY MOUTH EVERY DAY FOR BLOOD PRESSURE 90 tablet 2   Multiple Vitamins-Minerals (MULTIVITAMIN PO) Take 1 tablet by mouth daily.      pantoprazole  (PROTONIX ) 40 MG tablet Take 1 tablet (40 mg total) by mouth 2 (two) times daily. for  heartburn. 180 tablet 1   pregabalin  (LYRICA ) 50 MG capsule Start with twice daily and if no side effects in 2 weeks can increase to 3x a day for chronic pain 90 capsule 6   rosuvastatin  (CRESTOR ) 5 MG tablet TAKE 1 TABLET BY MOUTH EVERY DAY FOR CHOLESTEROL 90 tablet 2   traZODone  (DESYREL ) 100 MG tablet TAKE 1 TABLET BY MOUTH EVERYDAY AT BEDTIME 90 tablet 0    No current facility-administered medications for this visit.    Medication Side Effects: None  Allergies: No Known Allergies  Past Medical History:  Diagnosis Date   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   Anxiety    Asthma    as a child   CAP (community acquired pneumonia)    Cataract 2018   removed both eyes   COLONIC POLYPS, HX OF 11/15/2009   DEPRESSION 11/14/2009   GASTROJEJ ULCR UNS ACUT/CHRN W/O HEMOR PERF/OBST 11/06/2009   Generalized abdominal pain 12/02/2016   GERD (gastroesophageal reflux disease)    not on meds   Headache(784.0) 11/15/2009   Heart murmur    never bothered pt. always been told this   HEART MURMUR, HX OF 11/15/2009   HIATAL HERNIA 10/08/2009   Large   HIP FRACTURE, LEFT 11/14/2009   History of hiatal hernia    HYPERLIPIDEMIA 11/14/2009   HYPERTENSION 11/14/2009   Intractable nausea and vomiting 07/05/2023   OSTEOPOROSIS 11/14/2009   Prolonged Q-T interval on ECG 09/02/2021   WEIGHT GAIN 01/10/2010    Past Medical History, Surgical history, Social history, and Family history were reviewed and updated as appropriate.   Please see review of systems for further details on the patient's review from today.   Objective:   Physical Exam:  There were no vitals taken for this visit.  Physical Exam Constitutional:      General: She is not in acute distress. Musculoskeletal:        General: No deformity.  Neurological:     Mental Status: She is alert and oriented to person, place, and time.     Coordination: Coordination normal.  Psychiatric:        Attention and Perception: Attention and perception normal. She does not perceive auditory or visual hallucinations.        Mood and Affect: Mood normal. Mood is not anxious or depressed. Affect is not labile, blunt, angry or inappropriate.        Speech: Speech normal.        Behavior: Behavior normal.        Thought Content: Thought content normal. Thought content is not paranoid or delusional.  Thought content does not include homicidal or suicidal ideation. Thought content does not include homicidal or suicidal plan.        Cognition and Memory: Cognition and memory normal.        Judgment: Judgment normal.     Comments: Insight intact     Lab Review:     Component Value Date/Time   NA 134 (L) 07/05/2023 1255   K 3.7 07/05/2023 1255   CL 99 07/05/2023 1255   CO2 19 (L) 07/05/2023 1255   GLUCOSE 93 07/05/2023 1255   BUN 14 07/05/2023 1255   CREATININE 0.75 07/05/2023 1827   CREATININE 1.01 (H) 02/25/2018 1448   CALCIUM  9.3 07/05/2023 1255   PROT 6.6 07/05/2023 1255   ALBUMIN 3.7 07/05/2023 1255   AST 28 07/05/2023 1255   ALT 13 07/05/2023 1255   ALKPHOS 42 07/05/2023 1255   BILITOT 0.8 07/05/2023 1255  GFRNONAA >60 07/05/2023 1827   GFRAA >60 01/02/2017 0515       Component Value Date/Time   WBC 14.3 (H) 07/05/2023 1827   RBC 3.47 (L) 07/05/2023 1827   HGB 11.0 (L) 07/05/2023 1827   HCT 32.1 (L) 07/05/2023 1827   PLT 342 07/05/2023 1827   MCV 92.5 07/05/2023 1827   MCH 31.7 07/05/2023 1827   MCHC 34.3 07/05/2023 1827   RDW 13.1 07/05/2023 1827   LYMPHSABS 1.4 07/05/2023 1255   MONOABS 1.2 (H) 07/05/2023 1255   EOSABS 0.0 07/05/2023 1255   BASOSABS 0.0 07/05/2023 1255    No results found for: POCLITH, LITHIUM   No results found for: PHENYTOIN, PHENOBARB, VALPROATE, CBMZ   .res Assessment: Plan:    Plan:  PDMP reviewed  Continue:  Zoloft  200mg  daily  Trazadone 100mg  at hs through PCP  RTC 6 months  Patient advised to contact office with any questions, adverse effects, or acute worsening in signs and symptoms.  Time spent with patient was 20 minutes. Greater than 50% of face to face time with patient was spent on counseling and coordination of care.    There are no diagnoses linked to this encounter.   Please see After Visit Summary for patient specific instructions.  Future Appointments  Date Time Provider Department  Center  12/22/2023 11:30 AM [facility] [place] VISIT 1 [facility]  12/27/2023  2:45 PM Johna [name] JAYSON ALMETA [facility]  01/19/2024  9:40 AM Cornelio Bouchard, MD [facility]  02/28/2024 11:40 AM Cornelio Bouchard, MD [facility]  04/10/2024 11:00 AM Lov[place], Bouchard, MD [facility]    No orders of the defined types were placed in this encounter.   -------------------------------

## 2023-12-22 ENCOUNTER — Ambulatory Visit (INDEPENDENT_AMBULATORY_CARE_PROVIDER_SITE_OTHER): Payer: PPO

## 2023-12-22 VITALS — Ht 60.0 in | Wt 106.0 lb

## 2023-12-22 DIAGNOSIS — Z0000 Encounter for general adult medical examination without abnormal findings: Secondary | ICD-10-CM

## 2023-12-22 NOTE — Patient Instructions (Signed)
 Ms. Billingham , Thank you for taking time out of your busy schedule to complete your Annual Wellness Visit with me. I enjoyed our conversation and look forward to speaking with you again next year. I, as well as your care team,  appreciate your ongoing commitment to your health goals. Please review the following plan we discussed and let me know if I can assist you in the future. Your Game plan/ To Do List    Referrals: If you haven't heard from the office you've been referred to, please reach out to them at the phone provided.   Follow up Visits: We will see or speak with you next year for your Next Medicare AWV with our clinical staff-12/26/24 @ 11:30am televisit Have you seen your provider in the last 6 months (3 months if uncontrolled diabetes)? No  Clinician Recommendations:  Aim for 30 minutes of exercise or brisk walking, 6-8 glasses of water, and 5 servings of fruits and vegetables each day.       This is a list of the screenings recommended for you:  Health Maintenance  Topic Date Due   COVID-19 Vaccine (6 - 2024-25 season) 01/03/2023   Flu Shot  12/03/2023   Colon Cancer Screening  03/07/2024   DTaP/Tdap/Td vaccine (2 - Td or Tdap) 04/08/2024*   Medicare Annual Wellness Visit  12/21/2024   Mammogram  11/25/2025   Pneumococcal Vaccine for age over 25  Completed   DEXA scan (bone density measurement)  Completed   Hepatitis C Screening  Completed   Zoster (Shingles) Vaccine  Completed   HPV Vaccine  Aged Out   Meningitis B Vaccine  Aged Out  *Topic was postponed. The date shown is not the original due date.    Advanced directives: (Copy Requested) Please bring a copy of your health care power of attorney and living will to the office to be added to your chart at your convenience. You can mail to [facility] [address] [place], [place] [address] or email to [email or web address] Advance Care Planning is important because it:  [x]  Makes sure you  receive the medical care that is consistent with your values, goals, and preferences  [x]  It provides guidance to your family and loved ones and reduces their decisional burden about whether or not they are making the right decisions based on your wishes.  Follow the link provided in your after visit summary or read over the paperwork we have mailed to you to help you started getting your Advance Directives in place. If you need assistance in completing these, please reach out to us  so that we can help you!

## 2023-12-22 NOTE — Progress Notes (Signed)
 Subjective:   [name] is a 73 y.o. who presents for a Medicare Wellness preventive visit.  As a reminder, Annual Wellness Visits don't include a physical exam, and some assessments may be limited, especially if this visit is performed virtually. We may recommend an in-person follow-up visit with your provider if needed.  Visit Complete: Virtual I connected with  [name] on 12/22/23 by a audio enabled telemedicine application and verified that I am speaking with the correct person using two identifiers.  Patient Location: Home  Provider Location: Office/Clinic  I discussed the limitations of evaluation and management by telemedicine. The patient expressed understanding and agreed to proceed.  Vital Signs: Because this visit was a virtual/telehealth visit, some criteria may be missing or patient reported. Any vitals not documented were not able to be obtained and vitals that have been documented are patient reported.  VideoDeclined- This patient declined Librarian, academic. Therefore the visit was completed with audio only.  Persons Participating in Visit: Patient.  AWV Questionnaire: No: Patient Medicare AWV questionnaire was not completed prior to this visit.  Cardiac Risk Factors include: advanced age (>49men, >34 women);dyslipidemia;hypertension     Objective:    Today's Vitals   12/22/23 1128  Weight: 106 lb (48.1 kg)  Height: 5' (1.524 m)  PainSc: 8    Body mass index is 20.7 kg/m.     12/22/2023   11:39 AM 07/05/2023    3:57 PM 07/05/2023    9:12 AM 06/27/2023    2:05 PM 12/15/2022    2:46 PM 10/28/2021   12:48 PM 09/15/2021   12:20 PM  Advanced Directives  Does Patient Have a Medical Advance Directive? Yes Yes Yes Yes Yes Yes Yes  Type of Estate agent of [place];Living will Healthcare Power of [place];Living will Healthcare Power of [place];Living will Living will;Healthcare Power of [employer] Power of [place];Living will Healthcare Power of [place];Living will Healthcare Power of Attorney  Does patient want to make changes to medical advance directive?  Yes (Inpatient - patient defers changing a medical advance directive and declines information at this time) No - Patient declined No - Patient declined  No - Patient declined No - Patient declined  Copy of Healthcare Power of Attorney in Chart? No - copy requested No - copy requested  No - copy requested, Physician notified No - copy requested No - copy requested No - copy requested    Current Medications (verified) Outpatient Encounter Medications as of 12/22/2023  Medication Sig   CALCIUM  PO Take 1 tablet by mouth daily.    cyclobenzaprine  (FLEXERIL ) 10 MG tablet Take 1 tablet (10 mg total) by mouth 3 (three) times daily as needed for muscle spasms.   lisinopril  (ZESTRIL ) 20 MG tablet TAKE 1 TABLET BY MOUTH EVERY DAY FOR BLOOD PRESSURE   Multiple Vitamins-Minerals (MULTIVITAMIN PO) Take 1 tablet by mouth daily.    pantoprazole  (PROTONIX ) 40 MG tablet Take 1 tablet (40 mg total) by mouth 2 (two) times daily. for heartburn.   pregabalin  (LYRICA ) 50 MG capsule Start with twice daily and if no side effects in 2 weeks can increase to 3x a day for chronic pain   rosuvastatin  (CRESTOR ) 5 MG tablet TAKE 1 TABLET BY MOUTH EVERY DAY FOR CHOLESTEROL   sertraline  (ZOLOFT ) 100 MG tablet Take 2 tablets (200 mg total) by mouth daily.   traZODone  (DESYREL ) 100 MG tablet Take 1 tablet (100 mg total) by mouth at bedtime.  No facility-administered encounter medications on file as of 12/22/2023.    Allergies (verified) Patient has no known allergies.   History: Past Medical History:  Diagnosis Date   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   Anxiety    Asthma    as a child   CAP (community acquired pneumonia)    Cataract 2018   removed both eyes   COLONIC POLYPS, HX OF 11/15/2009   DEPRESSION 11/14/2009   GASTROJEJ ULCR UNS  ACUT/CHRN W/O HEMOR PERF/OBST 11/06/2009   Generalized abdominal pain 12/02/2016   GERD (gastroesophageal reflux disease)    not on meds   Headache(784.0) 11/15/2009   Heart murmur    never bothered pt. always been told this   HEART MURMUR, HX OF 11/15/2009   HIATAL HERNIA 10/08/2009   Large   HIP FRACTURE, LEFT 11/14/2009   History of hiatal hernia    HYPERLIPIDEMIA 11/14/2009   HYPERTENSION 11/14/2009   Intractable nausea and vomiting 07/05/2023   OSTEOPOROSIS 11/14/2009   Prolonged Q-T interval on ECG 09/02/2021   WEIGHT GAIN 01/10/2010   Past Surgical History:  Procedure Laterality Date   APPENDECTOMY     AUGMENTATION MAMMAPLASTY Bilateral    BACK SURGERY  2003   Bleeding ulcers  10/2009   Broken Hip Left 06/2001   CATARACT EXTRACTION Bilateral 01/2017   COLONOSCOPY N/A 12/30/2016   Procedure: COLONOSCOPY WITH PROPOFOL ;  Surgeon: Debby Hila, MD;  Location: [facility];  Service: Endoscopy;  Laterality: N/A;   COLONOSCOPY  2011   inadequate prep, polyps   EYE SURGERY     FRACTURE SURGERY     GASTROSTOMY N/A 09/15/2021   Procedure: INSERTION OF GASTROSTOMY TUBE;  Surgeon: Signe Mitzie LABOR, MD;  Location: [facility];  Service: General;  Laterality: N/A;   NECK SURGERY  2004 & 2006   x's 2   POLYPECTOMY     RECTAL PROLAPSE REPAIR  01/2017   SMALL INTESTINE SURGERY     SPINE SURGERY     XI ROBOTIC ASSISTED PARAESOPHAGEAL HERNIA REPAIR N/A 09/15/2021   Procedure: XI ROBOTIC ASSISTED PARAESOPHAGEAL HERNIA REPAIR WITH GASTROPEXY;  Surgeon: Signe Mitzie LABOR, MD;  Location: [facility];  Service: General;  Laterality: N/A;   Family History  Problem Relation Age of Onset   Bone cancer Mother    Anxiety disorder Mother    Cancer Mother    Depression Mother    Hearing loss Father    Hypertension Other    Anxiety disorder Sister    Anxiety disorder Sister    Allergic rhinitis Neg Hx    Asthma Neg Hx    Urticaria Neg Hx    Colon cancer Neg Hx    Colon polyps Neg Hx     Esophageal cancer Neg Hx    Rectal cancer Neg Hx    Stomach cancer Neg Hx    Breast cancer Neg Hx    Social History   Socioeconomic History   Marital status: Married    Spouse name: [name]   Number of children: 3   Years of education: BA   Highest education level: Not on file  Occupational History   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 [employer]  Tobacco Use   Smoking status: Former    Current packs/day: 0.00    Types: Cigarettes    Start date: 05/05/1967    Quit date: 05/04/1982    Years since quitting: 41.6   Smokeless tobacco: Never   Tobacco comments:    Married, 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[place], 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 (stress)  Vaping Use   Vaping status: Never Used  Substance and Sexual Activity   Alcohol use: Not Currently    Alcohol/week: 1.0 standard drink of alcohol    Types: 1 Glasses of wine per week   Drug use: No   Sexual activity: Not Currently    Birth control/protection: None  Other Topics Concern   Not on file  Social History Narrative   Lives with husband   Caffeine use: 1 cup or less    Right handed   Social Drivers of Corporate investment banker Strain: Low Risk  (12/22/2023)   Overall Financial Resource Strain (CARDIA)    Difficulty of Paying Living Expenses: Not hard at all  Food Insecurity: No Food Insecurity (12/22/2023)   Hunger Vital Sign    Worried About Running Out of Food in the Last Year: Never true    Ran Out of Food in the Last Year: Never true  Transportation Needs: No Transportation Needs (12/22/2023)   PRAPARE - Administrator, Civil Service (Medical): No    Lack of Transportation (Non-Medical): No  Physical Activity: Sufficiently Active (12/22/2023)   Exercise Vital Sign    Days of Exercise per Week: 5 days    Minutes of Exercise per Session: 50 min  Stress: No Stress Concern Present (12/22/2023)   [employer] of Occupational Health - Occupational Stress Questionnaire     Feeling of Stress: Not at all  Social Connections: Moderately Isolated (12/22/2023)   Social Connection and Isolation Panel    Frequency of Communication with Friends and Family: More than three times a week    Frequency of Social Gatherings with Friends and Family: Three times a week    Attends Religious Services: Never    Active Member of Clubs or Organizations: No    Attends Banker Meetings: Never    Marital Status: Married    Tobacco Counseling Counseling given: Not Answered Tobacco comments: Married, 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[place], 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 (stress)    Clinical Intake:  Pre-visit preparation completed: Yes  Pain : 0-10 Pain Score: 8  Pain Type: Chronic pain Pain Location: Neck Pain Descriptors / Indicators: Aching Pain Onset: More than a month ago Pain Frequency: Constant Pain Relieving Factors: back brace;analgesics creams Effect of Pain on Daily Activities: take rest  Pain Relieving Factors: back brace;analgesics creams  BMI - recorded: 20.7 Nutritional Status: BMI of 19-24  Normal Nutritional Risks: None Diabetes: No  Lab Results  Component Value Date   HGBA1C 5.8 04/09/2023   HGBA1C 5.8 04/07/2022   HGBA1C 5.7 04/03/2021     How often do you need to have someone help you when you read instructions, pamphlets, or other written materials from your doctor or pharmacy?: 1 - Never  Interpreter Needed?: No  Comments: lives with husband Information entered by :: B.Emre Stock,LPN   Activities of Daily Living     12/22/2023   11:41 AM 07/05/2023    3:57 PM  In your present state of health, do you have any difficulty performing the following activities:  Hearing? 0 0  Vision? 0 0  Difficulty concentrating or making decisions? 1 0  Walking or climbing stairs? 0   Dressing or bathing? 0   Doing errands, shopping? 0   Preparing Food and eating ? N   Using the Toilet? N   In  the  past six months, have you accidently leaked urine? N   Do you have problems with loss of bowel control? N   Managing your Medications? N   Managing your Finances? N   Housekeeping or managing your Housekeeping? N     Patient Care Team: Gretta Comer POUR, NP as PCP - General (Internal Medicine) Ines Onetha NOVAK, MD as Consulting Physician (Neurology) Merilee Laymon MATSU, NP as Nurse Practitioner (Psychology) Grossman, Janelle, NP as Nurse Practitioner (Nurse Practitioner) [facility], [place]  I have updated your Care Teams any recent Medical Services you may have received from other providers in the past year.     Assessment:   This is a routine wellness examination for [name].  Hearing/Vision screen Hearing Screening - Comments:: Pt says her hearing is good Vision Screening - Comments:: Pt says her vision is good Dr Heather [place]   Goals Addressed               This Visit's Progress     Patient advised to follow up with PCP on AWV and Vaccines   On track     Patient Stated (pt-stated)   On track     12/22/23-I will maintain and continue medications as prescribed.      COMPLETED: Patient Stated        10/24/2020, I will continue to walk on my treadmill 6 days a week for 1 hour       Patient Stated   On track     12/22/23-Stay active and continue to make smart food choices.      Patient Stated        I would like to continue meditation       Depression Screen     12/22/2023   11:36 AM 12/08/2023   12:57 PM 10/11/2023    8:57 AM 12/15/2022    2:41 PM 06/12/2022    2:32 PM 04/07/2022   11:38 AM 10/28/2021   12:43 PM  PHQ 2/9 Scores  PHQ - 2 Score 0 0 0 0 0 0 0  PHQ- 9 Score   0   9     Fall Risk     12/22/2023   11:33 AM 12/08/2023   12:57 PM 10/11/2023    8:57 AM 04/09/2023   10:13 AM 12/15/2022    2:47 PM  Fall Risk   Falls in the past year? 0 0 0 1 0  Number falls in past yr: 0 0  0 0  Injury with Fall? 0 0  0 0  Risk for fall due to : No Fall Risks    History of fall(s) No Fall Risks  Follow up Education provided;Falls prevention discussed   Falls evaluation completed Falls prevention discussed;Falls evaluation completed    MEDICARE RISK AT HOME:  Medicare Risk at Home Any stairs in or around the home?: Yes If so, are there any without handrails?: Yes Home free of loose throw rugs in walkways, pet beds, electrical cords, etc?: Yes Adequate lighting in your home to reduce risk of falls?: Yes Life alert?: No Use of a cane, walker or w/c?: No Grab bars in the bathroom?: No Shower chair or bench in shower?: Yes Elevated toilet seat or a handicapped toilet?: No  TIMED UP AND GO:  Was the test performed?  No  Cognitive Function: 6CIT completed    10/24/2020    4:04 PM 03/16/2019    2:17 PM  MMSE - Mini Mental State Exam  Not completed:  Refused   Orientation to time  5  Orientation to Place  5  Registration  3  Attention/ Calculation  5  Recall  3  Language- repeat  1        12/22/2023   11:51 AM 12/15/2022    2:48 PM 10/28/2021   12:48 PM  6CIT Screen  What Year? 0 points 0 points 0 points  What month? 0 points 0 points 0 points  What time? 0 points 0 points 0 points  Count back from 20 0 points 0 points 0 points  Months in reverse 0 points 0 points 0 points  Repeat phrase 0 points 0 points 0 points  Total Score 0 points 0 points 0 points    Immunizations Immunization History  Administered Date(s) Administered   Fluad Quad(high Dose 65+) 01/05/2022   Influenza, High Dose Seasonal PF 01/13/2017, 01/28/2021, 01/20/2023   Influenza,inj,Quad PF,6+ Mos 12/07/2018   Influenza-Unspecified 01/13/2017, 02/03/2018, 01/01/2019, 02/07/2020   PFIZER(Purple Top)SARS-COV-2 Vaccination 05/29/2019, 06/19/2019, 01/27/2020, 08/20/2020   PNEUMOCOCCAL CONJUGATE-20 01/15/2021   Pfizer Covid-19 Vaccine Bivalent Booster 97yrs & up 01/16/2021   Pneumococcal Conjugate-13 12/20/2015   Pneumococcal Polysaccharide-23 01/02/2017   Tdap  08/29/2012   Zoster Recombinant(Shingrix) 11/19/2016, 04/18/2017   Zoster, Live 02/28/2013    Screening Tests Health Maintenance  Topic Date Due   COVID-19 Vaccine (6 - 2024-25 season) 01/03/2023   INFLUENZA VACCINE  12/03/2023   Colonoscopy  03/07/2024   DTaP/Tdap/Td (2 - Td or Tdap) 04/08/2024 (Originally 08/30/2022)   Medicare Annual Wellness (AWV)  12/21/2024   MAMMOGRAM  11/25/2025   Pneumococcal Vaccine: 50+ Years  Completed   DEXA SCAN  Completed   Hepatitis C Screening  Completed   Zoster Vaccines- Shingrix  Completed   HPV VACCINES  Aged Out   Meningococcal B Vaccine  Aged Out    Health Maintenance  Health Maintenance Due  Topic Date Due   COVID-19 Vaccine (6 - 2024-25 season) 01/03/2023   INFLUENZA VACCINE  12/03/2023   Colonoscopy  03/07/2024   Health Maintenance Items Addressed: Pt will get Covid and Influenza vaccines from her pharmacy  Additional Screening:  Vision Screening: Recommended annual ophthalmology exams for early detection of glaucoma and other disorders of the eye. Would you like a referral to an eye doctor? No    Dental Screening: Recommended annual dental exams for proper oral hygiene  Community Resource Referral / Chronic Care Management: CRR required this visit?  No   CCM required this visit?  Appt scheduled with PCP   Plan:    I have personally reviewed and noted the following in the patient's chart:   Medical and social history Use of alcohol, tobacco or illicit drugs  Current medications and supplements including opioid prescriptions. Patient is not currently taking opioid prescriptions. Functional ability and status Nutritional status Physical activity Advanced directives List of other physicians Hospitalizations, surgeries, and ER visits in previous 12 months Vitals Screenings to include cognitive, depression, and falls Referrals and appointments  In addition, I have reviewed and discussed with patient certain  preventive protocols, quality metrics, and best practice recommendations. A written personalized care plan for preventive services as well as general preventive health recommendations were provided to patient.   Erminio LITTIE Saris, LPN   [id number]   After Visit Summary: (MyChart) Due to this being a telephonic visit, the after visit summary with patients personalized plan was offered to patient via MyChart   Notes: Nothing significant to report at this time.

## 2023-12-27 ENCOUNTER — Ambulatory Visit: Attending: Physical Medicine and Rehabilitation

## 2023-12-27 ENCOUNTER — Other Ambulatory Visit: Payer: Self-pay

## 2023-12-27 DIAGNOSIS — R293 Abnormal posture: Secondary | ICD-10-CM | POA: Insufficient documentation

## 2023-12-27 DIAGNOSIS — M6281 Muscle weakness (generalized): Secondary | ICD-10-CM | POA: Diagnosis not present

## 2023-12-27 DIAGNOSIS — M7918 Myalgia, other site: Secondary | ICD-10-CM | POA: Diagnosis not present

## 2023-12-27 DIAGNOSIS — M542 Cervicalgia: Secondary | ICD-10-CM | POA: Insufficient documentation

## 2023-12-27 NOTE — Therapy (Signed)
 OUTPATIENT PHYSICAL THERAPY CERVICAL EVALUATION   Patient Name: [name] MRN: [id number] DOB:12-04-1950, 73 y.o., female Today's Date: 12/27/2023  END OF SESSION:  PT End of Session - 12/27/23 1706     Visit Number 1    Number of Visits 17    Date for PT Re-Evaluation 02/21/24    Authorization Type HTA    PT Start Time 1442    PT Stop Time 1534    PT Time Calculation (min) 52 min    Activity Tolerance Patient tolerated treatment well    Behavior During Therapy [facility] for tasks assessed/performed          Past Medical History:  Diagnosis Date   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   Anxiety    Asthma    as a child   CAP (community acquired pneumonia)    Cataract 2018   removed both eyes   COLONIC POLYPS, HX OF 11/15/2009   DEPRESSION 11/14/2009   GASTROJEJ ULCR UNS ACUT/CHRN W/O HEMOR PERF/OBST 11/06/2009   Generalized abdominal pain 12/02/2016   GERD (gastroesophageal reflux disease)    not on meds   Headache(784.0) 11/15/2009   Heart murmur    never bothered pt. always been told this   HEART MURMUR, HX OF 11/15/2009   HIATAL HERNIA 10/08/2009   Large   HIP FRACTURE, LEFT 11/14/2009   History of hiatal hernia    HYPERLIPIDEMIA 11/14/2009   HYPERTENSION 11/14/2009   Intractable nausea and vomiting 07/05/2023   OSTEOPOROSIS 11/14/2009   Prolonged Q-T interval on ECG 09/02/2021   WEIGHT GAIN 01/10/2010   Past Surgical History:  Procedure Laterality Date   APPENDECTOMY     AUGMENTATION MAMMAPLASTY Bilateral    BACK SURGERY  2003   Bleeding ulcers  10/2009   Broken Hip Left 06/2001   CATARACT EXTRACTION Bilateral 01/2017   COLONOSCOPY N/A 12/30/2016   Procedure: COLONOSCOPY WITH PROPOFOL ;  Surgeon: Debby Hila, MD;  Location: [facility];  Service: Endoscopy;  Laterality: N/A;   COLONOSCOPY  2011   inadequate prep, polyps   EYE SURGERY     FRACTURE SURGERY     GASTROSTOMY N/A 09/15/2021   Procedure: INSERTION OF GASTROSTOMY TUBE;  Surgeon:  Signe Mitzie LABOR, MD;  Location: [facility];  Service: General;  Laterality: N/A;   NECK SURGERY  2004 & 2006   x's 2   POLYPECTOMY     RECTAL PROLAPSE REPAIR  01/2017   SMALL INTESTINE SURGERY     SPINE SURGERY     XI ROBOTIC ASSISTED PARAESOPHAGEAL HERNIA REPAIR N/A 09/15/2021   Procedure: XI ROBOTIC ASSISTED PARAESOPHAGEAL HERNIA REPAIR WITH GASTROPEXY;  Surgeon: Signe Mitzie LABOR, MD;  Location: [facility];  Service: General;  Laterality: N/A;   Patient Active Problem List   Diagnosis Date Noted   Cervical myofascial pain syndrome 10/11/2023   Family history of migraine headaches 07/06/2023   Leukocytosis 07/06/2023   High anion gap metabolic acidosis 07/06/2023   Intractable nausea and vomiting 07/05/2023   Cerumen impaction 04/09/2023   High risk medication use 06/12/2022   Insomnia 02/18/2022   S/P repair of paraesophageal hernia 09/15/2021   Prediabetes 04/03/2021   Decreased renal function 03/21/2019   Preventative health care 02/25/2018   Asthma 06/24/2017   Osteoarthritis 05/25/2017   Rectal prolapse 12/31/2016   Medicare annual wellness visit, subsequent 12/02/2016   Chronic pain 05/26/2016   Chronic migraine without aura without status migrainosus, not intractable 01/12/2016   Rectal discomfort 12/01/2012   Hemorrhoids 12/01/2012   History of  colonic polyps 11/15/2009   Hyperlipidemia 11/14/2009   Anxiety and depression 11/14/2009   Essential hypertension 11/14/2009   Osteopenia 11/14/2009   Gastrojejunal ulcer 11/06/2009   HIATAL HERNIA 10/08/2009    PCP: Gretta Comer POUR, NP   REFERRING PROVIDER: Cornelio Bouchard, MD  REFERRING DIAG: M79.18 (ICD-10-CM) - Cervical myofascial pain syndrome   THERAPY DIAG:  Cervicalgia  Muscle weakness (generalized)  Abnormal posture  Rationale for Evaluation and Treatment: Rehabilitation  ONSET DATE: Chronic  SUBJECTIVE:   SUBJECTIVE STATEMENT: Pt presents to PT with reports of chronic neck pain and discomfort. Denies  referral of symptoms down UE. Pain is located in occipitals and posterior neck. Made worse with activities she enjoys such as reading or knitting. Had trigger point injections and feel that they helped a little bit.   PERTINENT HISTORY: HTN, Osteoporosis   PAIN:  Are you having pain?  Yes: NPRS scale: 7/10 Worst: 10/10 Pain location: sub occipitals, bilateral upper trap  Pain description: sore, tight Aggravating factors: reading, driving, needle-point  Relieving factors: heat  PRECAUTIONS: None  RED FLAGS: None   WEIGHT BEARING RESTRICTIONS: No  FALLS:  Has patient fallen in last 6 months? No  LIVING ENVIRONMENT: Lives with: lives with their family Lives in: House/apartment  OCCUPATION: Retired  PLOF: Independent  PATIENT GOALS: decrease neck pain, be able to knit and read without pain  NEXT MD VISIT: 02/28/2024  OBJECTIVE:  Note: Objective measures were completed at Evaluation unless otherwise noted.  DIAGNOSTIC FINDINGS: See imaging   PATIENT SURVEYS:  NDI:  NECK DISABILITY INDEX  Date: 12/27/2023 Score  Pain intensity 3 = The pain is fairly severe at the moment  2. Personal care (washing, dressing, etc.) 0 = I can look after myself normally without causing extra pain  3. Lifting 1 =  I can lift heavy weights but it gives extra pain  4. Reading 3 = I can't read as much as I want because of moderate pain in my neck  5. Headaches 2 =  I have moderate headaches, which come infrequently  6. Concentration 3 = I have a lot of difficulty in concentrating when I want to  7. Work 0 =  I can do as much work as I want to  8. Driving 0 = I can drive my car without any neck pain  9. Sleeping 1 = My sleep is slightly disturbed (less than 1 hr sleepless)  10. Recreation 2 = I am able to engage in most, but not all of my usual recreation activities because of   pain in my neck  Total 15/50   Minimum Detectable Change (90% confidence): 5 points or 10%  points  COGNITION: Overall cognitive status: Within functional limits for tasks assessed     SENSATION: WFL  POSTURE: rounded shoulders, forward head, and increased thoracic kyphosis  PALPATION: TTP to bilateral suboccipitals, bilateral upper traps  CERVICAL ROM:   Active ROM A/PROM (deg) eval  Flexion   Extension   Right lateral flexion   Left lateral flexion   Right rotation 40  Left rotation 40   (Blank rows = not tested)  UPPER EXTREMITY MMT:  MMT Right eval Left eval  Shoulder flexion    Shoulder extension    Shoulder abduction    Shoulder adduction    Shoulder extension    Shoulder internal rotation    Shoulder external rotation    Middle trapezius 2+ 2+  Lower trapezius 2+ 2+  Elbow flexion    Elbow  extension    Wrist flexion    Wrist extension    Wrist ulnar deviation    Wrist radial deviation    Wrist pronation    Wrist supination    Grip strength     (Blank rows = not tested)    FUNCTIONAL TESTS:  Cervical Flexor Endurance Test: 5 sec  GAIT: Distance walked: 36ft Assistive device utilized: None Level of assistance: Complete Independence Comments: flexed trunk   TREATMENT: [facility] Adult PT Treatment:                                                DATE: 12/27/2023 Therapeutic Exercise: Seated SNAG rot x 5 ea Seated row x 10 RTB Supine chin tuck x 5 - 5 hold Theracane demonstration  Manual Therapy: Suboccipital release Positional release bilateral upper trap  PATIENT EDUCATION:  Education details: eval findings, NDI, HEP, POC Person educated: Patient Education method: Explanation, Demonstration, and Handouts Education comprehension: verbalized understanding and returned demonstration  HOME EXERCISE PROGRAM: Access Code: [email or web address] URL: [email or web address] Date: 12/27/2023 Prepared by: [name] Kingdom  Exercises - Seated Assisted Cervical Rotation with Towel  - 1 x daily - 7 x weekly - 2 sets - 10 reps - 5 sec hold -  Standing Shoulder Row with Anchored Resistance  - 1 x daily - 7 x weekly - 3 sets - 10 reps - red band hold - Supine Chin Tuck  - 1 x daily - 7 x weekly - 2 sets - 10 reps - 5 sec hold  ASSESSMENT:  CLINICAL IMPRESSION: Patient is a 73 y.o. F who was seen today for physical therapy evaluation and treatment for acute exacerbation of chronic neck pain. Physical findings are consistent with referring MD impression as pt demonstrates decrease in cervical ROM, postural muscle weakness, and TTP to bilateral upper traps and suboccipitals. NDI score shows moderate disability in performance of home ADLs and community activities. Pt would benefit from skilled PT services working to decrease neck pain by improving postural muscle strength and manual therapy interventions.    OBJECTIVE IMPAIRMENTS: decreased activity tolerance, decreased mobility, decreased ROM, decreased strength, postural dysfunction, and pain   ACTIVITY LIMITATIONS: carrying, lifting, sitting, standing, and reach over head  PARTICIPATION LIMITATIONS: meal prep, cleaning, driving, community activity, and knitting reading  PERSONAL FACTORS: Time since onset of injury/illness/exacerbation and 1-2 comorbidities: HTN, Osteoporosis  are also affecting patient's functional outcome.   REHAB POTENTIAL: Good  CLINICAL DECISION MAKING: Stable/uncomplicated  EVALUATION COMPLEXITY: Moderate   GOALS: Goals reviewed with patient? No  SHORT TERM GOALS: Target date: 01/17/2024   Pt will be compliant and knowledgeable with initial HEP for improved comfort and carryover Baseline: initial HEP given  Goal status: INITIAL  2.  Pt will self report neck pain no greater than 7/10 for improved comfort and functional ability Baseline: 10/10 at worst Goal status: INITIAL   LONG TERM GOALS: Target date: 02/21/2024   Pt will decrease NDI disability score to no greater than 20% (10/50) as proxy for functional improvement Baseline: 30% disability  (15/50) Goal status: INITIAL  2.  Pt will self report neck pain no greater than 3/10 for improved comfort and functional ability Baseline: 10/10 at worst Goal status: INITIAL   3.  Pt will improve bilateral lower/middle trap strength to at least 3+ for improved postural endurance and decreased pain Baseline: 2+ Goal  status: INITIAL  4.  Pt will improve bilateral cervical rotation AROM to at least 60 degrees for improved functional ability with driving and other community activities Baseline: see chart Goal status: INITIAL  5.  Pt will improve deep cervical flexor endurance time to at least 15 seconds for improved endurance and decreased neck pain Baseline: 5 sec Goal status: INITIAL  6.  Pt will be able to knit and read without increase in neck pain for improved comfort and function of desired activities  Baseline: unable Goal status: INITIAL   PLAN:  PT FREQUENCY: 1-2x/week  PT DURATION: 8 weeks  PLANNED INTERVENTIONS: 97164- PT Re-evaluation, 97110-Therapeutic exercises, 97530- Therapeutic activity, [id number]- Neuromuscular re-education, 97535- Self Care, [id number]- Manual therapy, [id number]- Gait training, H9716- Electrical stimulation (unattended), [id number]- Electrical stimulation (manual), 20560 (1-2 muscles), 20561 (3+ muscles)- Dry Needling, Cryotherapy, and Moist heat  PLAN FOR NEXT SESSION: assess HEP response, DNF and periscapular strengthening, manual   [name] JAYSON Kingdom, PT 12/27/2023, 5:08 PM

## 2023-12-31 ENCOUNTER — Encounter: Payer: Self-pay | Admitting: Physical Therapy

## 2024-01-05 ENCOUNTER — Ambulatory Visit: Attending: Physical Medicine and Rehabilitation

## 2024-01-05 DIAGNOSIS — M6281 Muscle weakness (generalized): Secondary | ICD-10-CM | POA: Diagnosis not present

## 2024-01-05 DIAGNOSIS — Z23 Encounter for immunization: Secondary | ICD-10-CM

## 2024-01-05 DIAGNOSIS — M542 Cervicalgia: Secondary | ICD-10-CM | POA: Insufficient documentation

## 2024-01-05 DIAGNOSIS — R293 Abnormal posture: Secondary | ICD-10-CM | POA: Insufficient documentation

## 2024-01-05 NOTE — Therapy (Signed)
 OUTPATIENT PHYSICAL THERAPY TREATMENT NOTE   Patient Name: [name] MRN: [id number] DOB:March 23, 1951, 73 y.o., female Today's Date: 01/05/2024  END OF SESSION:  PT End of Session - 01/05/24 1449     Visit Number 2    Number of Visits 17    Date for PT Re-Evaluation 02/21/24    Authorization Type HTA    PT Start Time 1445    PT Stop Time 1525    PT Time Calculation (min) 40 min    Activity Tolerance Patient tolerated treatment well    Behavior During Therapy [facility] for tasks assessed/performed           Past Medical History:  Diagnosis Date   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   Anxiety    Asthma    as a child   CAP (community acquired pneumonia)    Cataract 2018   removed both eyes   COLONIC POLYPS, HX OF 11/15/2009   DEPRESSION 11/14/2009   GASTROJEJ ULCR UNS ACUT/CHRN W/O HEMOR PERF/OBST 11/06/2009   Generalized abdominal pain 12/02/2016   GERD (gastroesophageal reflux disease)    not on meds   Headache(784.0) 11/15/2009   Heart murmur    never bothered pt. always been told this   HEART MURMUR, HX OF 11/15/2009   HIATAL HERNIA 10/08/2009   Large   HIP FRACTURE, LEFT 11/14/2009   History of hiatal hernia    HYPERLIPIDEMIA 11/14/2009   HYPERTENSION 11/14/2009   Intractable nausea and vomiting 07/05/2023   OSTEOPOROSIS 11/14/2009   Prolonged Q-T interval on ECG 09/02/2021   WEIGHT GAIN 01/10/2010   Past Surgical History:  Procedure Laterality Date   APPENDECTOMY     AUGMENTATION MAMMAPLASTY Bilateral    BACK SURGERY  2003   Bleeding ulcers  10/2009   Broken Hip Left 06/2001   CATARACT EXTRACTION Bilateral 01/2017   COLONOSCOPY N/A 12/30/2016   Procedure: COLONOSCOPY WITH PROPOFOL ;  Surgeon: Debby Hila, MD;  Location: [facility];  Service: Endoscopy;  Laterality: N/A;   COLONOSCOPY  2011   inadequate prep, polyps   EYE SURGERY     FRACTURE SURGERY     GASTROSTOMY N/A 09/15/2021   Procedure: INSERTION OF GASTROSTOMY TUBE;  Surgeon: Signe Mitzie LABOR, MD;  Location: [facility];  Service: General;  Laterality: N/A;   NECK SURGERY  2004 & 2006   x's 2   POLYPECTOMY     RECTAL PROLAPSE REPAIR  01/2017   SMALL INTESTINE SURGERY     SPINE SURGERY     XI ROBOTIC ASSISTED PARAESOPHAGEAL HERNIA REPAIR N/A 09/15/2021   Procedure: XI ROBOTIC ASSISTED PARAESOPHAGEAL HERNIA REPAIR WITH GASTROPEXY;  Surgeon: Signe Mitzie LABOR, MD;  Location: [facility];  Service: General;  Laterality: N/A;   Patient Active Problem List   Diagnosis Date Noted   Cervical myofascial pain syndrome 10/11/2023   Family history of migraine headaches 07/06/2023   Leukocytosis 07/06/2023   High anion gap metabolic acidosis 07/06/2023   Intractable nausea and vomiting 07/05/2023   Cerumen impaction 04/09/2023   High risk medication use 06/12/2022   Insomnia 02/18/2022   S/P repair of paraesophageal hernia 09/15/2021   Prediabetes 04/03/2021   Decreased renal function 03/21/2019   Preventative health care 02/25/2018   Asthma 06/24/2017   Osteoarthritis 05/25/2017   Rectal prolapse 12/31/2016   Medicare annual wellness visit, subsequent 12/02/2016   Chronic pain 05/26/2016   Chronic migraine without aura without status migrainosus, not intractable 01/12/2016   Rectal discomfort 12/01/2012   Hemorrhoids 12/01/2012   History  of colonic polyps 11/15/2009   Hyperlipidemia 11/14/2009   Anxiety and depression 11/14/2009   Essential hypertension 11/14/2009   Osteopenia 11/14/2009   Gastrojejunal ulcer 11/06/2009   HIATAL HERNIA 10/08/2009    PCP: Gretta Comer POUR, NP   REFERRING PROVIDER: Cornelio Bouchard, MD  REFERRING DIAG: M79.18 (ICD-10-CM) - Cervical myofascial pain syndrome   THERAPY DIAG:  Cervicalgia  Muscle weakness (generalized)  Abnormal posture  Rationale for Evaluation and Treatment: Rehabilitation  ONSET DATE: Chronic  SUBJECTIVE:   SUBJECTIVE STATEMENT: Has been compliant with HEP, no marked change in symptoms to note  yet.  PERTINENT HISTORY: HTN, Osteoporosis   PAIN:  Are you having pain?  Yes: NPRS scale: 7/10 Worst: 10/10 Pain location: sub occipitals, bilateral upper trap  Pain description: sore, tight Aggravating factors: reading, driving, needle-point  Relieving factors: heat  PRECAUTIONS: None  RED FLAGS: None   WEIGHT BEARING RESTRICTIONS: No  FALLS:  Has patient fallen in last 6 months? No  LIVING ENVIRONMENT: Lives with: lives with their family Lives in: House/apartment  OCCUPATION: Retired  PLOF: Independent  PATIENT GOALS: decrease neck pain, be able to knit and read without pain  NEXT MD VISIT: 02/28/2024  OBJECTIVE:  Note: Objective measures were completed at Evaluation unless otherwise noted.  DIAGNOSTIC FINDINGS: See imaging   PATIENT SURVEYS:  NDI:  NECK DISABILITY INDEX  Date: 12/27/2023 Score  Pain intensity 3 = The pain is fairly severe at the moment  2. Personal care (washing, dressing, etc.) 0 = I can look after myself normally without causing extra pain  3. Lifting 1 =  I can lift heavy weights but it gives extra pain  4. Reading 3 = I can't read as much as I want because of moderate pain in my neck  5. Headaches 2 =  I have moderate headaches, which come infrequently  6. Concentration 3 = I have a lot of difficulty in concentrating when I want to  7. Work 0 =  I can do as much work as I want to  8. Driving 0 = I can drive my car without any neck pain  9. Sleeping 1 = My sleep is slightly disturbed (less than 1 hr sleepless)  10. Recreation 2 = I am able to engage in most, but not all of my usual recreation activities because of   pain in my neck  Total 15/50   Minimum Detectable Change (90% confidence): 5 points or 10% points  COGNITION: Overall cognitive status: Within functional limits for tasks assessed     SENSATION: WFL  POSTURE: rounded shoulders, forward head, and increased thoracic kyphosis  PALPATION: TTP to bilateral  suboccipitals, bilateral upper traps  CERVICAL ROM:   Active ROM A/PROM (deg) eval  Flexion   Extension   Right lateral flexion   Left lateral flexion   Right rotation 40  Left rotation 40   (Blank rows = not tested)  UPPER EXTREMITY MMT:  MMT Right eval Left eval  Shoulder flexion    Shoulder extension    Shoulder abduction    Shoulder adduction    Shoulder extension    Shoulder internal rotation    Shoulder external rotation    Middle trapezius 2+ 2+  Lower trapezius 2+ 2+  Elbow flexion    Elbow extension    Wrist flexion    Wrist extension    Wrist ulnar deviation    Wrist radial deviation    Wrist pronation    Wrist supination  Grip strength     (Blank rows = not tested)    FUNCTIONAL TESTS:  Cervical Flexor Endurance Test: 5 sec  GAIT: Distance walked: 65ft Assistive device utilized: None Level of assistance: Complete Independence Comments: flexed trunk   TREATMENT: [facility] Adult PT Treatment:                                                DATE: 01/05/24 Therapeutic Exercise: Nustep L2 8 min Manual Therapy: B UT stretch 30s x2  B levator scapula stretch 30s x2 B scalene stretch 30s x3 Neuromuscular re-ed: Supine hor abd YTB 15x B, 15/15 unilateral Supine OH flexion 1# 15/15  [facility] Adult PT Treatment:                                                DATE: 12/27/2023 Therapeutic Exercise: Seated SNAG rot x 5 ea Seated row x 10 RTB Supine chin tuck x 5 - 5 hold Theracane demonstration  Manual Therapy: Suboccipital release Positional release bilateral upper trap  PATIENT EDUCATION:  Education details: eval findings, NDI, HEP, POC Person educated: Patient Education method: Explanation, Demonstration, and Handouts Education comprehension: verbalized understanding and returned demonstration  HOME EXERCISE PROGRAM: Access Code: [email or web address] URL: [email or web address] Date: 12/27/2023 Prepared by: [name] Kingdom  Exercises - Seated  Assisted Cervical Rotation with Towel  - 1 x daily - 7 x weekly - 2 sets - 10 reps - 5 sec hold - Standing Shoulder Row with Anchored Resistance  - 1 x daily - 7 x weekly - 3 sets - 10 reps - red band hold - Supine Chin Tuck  - 1 x daily - 7 x weekly - 2 sets - 10 reps - 5 sec hold  ASSESSMENT:  CLINICAL IMPRESSION: First f/u session.  Patient compliant with HEP.  Focus of treatment was HEP review, aerobic w/u, postural correction and manual stretching.  Patient is a 73 y.o. F who was seen today for physical therapy evaluation and treatment for acute exacerbation of chronic neck pain. Physical findings are consistent with referring MD impression as pt demonstrates decrease in cervical ROM, postural muscle weakness, and TTP to bilateral upper traps and suboccipitals. NDI score shows moderate disability in performance of home ADLs and community activities. Pt would benefit from skilled PT services working to decrease neck pain by improving postural muscle strength and manual therapy interventions.    OBJECTIVE IMPAIRMENTS: decreased activity tolerance, decreased mobility, decreased ROM, decreased strength, postural dysfunction, and pain   ACTIVITY LIMITATIONS: carrying, lifting, sitting, standing, and reach over head  PARTICIPATION LIMITATIONS: meal prep, cleaning, driving, community activity, and knitting reading  PERSONAL FACTORS: Time since onset of injury/illness/exacerbation and 1-2 comorbidities: HTN, Osteoporosis  are also affecting patient's functional outcome.   REHAB POTENTIAL: Good  CLINICAL DECISION MAKING: Stable/uncomplicated  EVALUATION COMPLEXITY: Moderate   GOALS: Goals reviewed with patient? No  SHORT TERM GOALS: Target date: 01/17/2024   Pt will be compliant and knowledgeable with initial HEP for improved comfort and carryover Baseline: initial HEP given  Goal status: INITIAL  2.  Pt will self report neck pain no greater than 7/10 for improved comfort and functional  ability Baseline: 10/10 at worst Goal status: INITIAL   LONG TERM  GOALS: Target date: 02/21/2024   Pt will decrease NDI disability score to no greater than 20% (10/50) as proxy for functional improvement Baseline: 30% disability (15/50) Goal status: INITIAL  2.  Pt will self report neck pain no greater than 3/10 for improved comfort and functional ability Baseline: 10/10 at worst Goal status: INITIAL   3.  Pt will improve bilateral lower/middle trap strength to at least 3+ for improved postural endurance and decreased pain Baseline: 2+ Goal status: INITIAL  4.  Pt will improve bilateral cervical rotation AROM to at least 60 degrees for improved functional ability with driving and other community activities Baseline: see chart Goal status: INITIAL  5.  Pt will improve deep cervical flexor endurance time to at least 15 seconds for improved endurance and decreased neck pain Baseline: 5 sec Goal status: INITIAL  6.  Pt will be able to knit and read without increase in neck pain for improved comfort and function of desired activities  Baseline: unable Goal status: INITIAL   PLAN:  PT FREQUENCY: 1-2x/week  PT DURATION: 8 weeks  PLANNED INTERVENTIONS: 97164- PT Re-evaluation, 97110-Therapeutic exercises, 97530- Therapeutic activity, [id number]- Neuromuscular re-education, 97535- Self Care, [id number]- Manual therapy, [id number]- Gait training, [phone]- Electrical stimulation (unattended), [id number]- Electrical stimulation (manual), 20560 (1-2 muscles), 20561 (3+ muscles)- Dry Needling, Cryotherapy, and Moist heat  PLAN FOR NEXT SESSION: assess HEP response, DNF and periscapular strengthening, manual   Reyes CHRISTELLA Kohut, PT 01/05/2024, 3:35 PM

## 2024-01-06 ENCOUNTER — Other Ambulatory Visit: Payer: Self-pay | Admitting: Primary Care

## 2024-01-06 DIAGNOSIS — Z23 Encounter for immunization: Secondary | ICD-10-CM

## 2024-01-06 MED ORDER — COVID-19 MRNA VACC (MODERNA) 50 MCG/0.5ML IM SUSP: 0.5000 mL | Freq: Once | INTRAMUSCULAR | 0 refills | Status: AC

## 2024-01-07 ENCOUNTER — Encounter: Admitting: Physical Medicine and Rehabilitation

## 2024-01-10 ENCOUNTER — Ambulatory Visit: Admitting: Physical Therapy

## 2024-01-10 ENCOUNTER — Encounter: Payer: Self-pay | Admitting: Physical Therapy

## 2024-01-10 DIAGNOSIS — M6281 Muscle weakness (generalized): Secondary | ICD-10-CM

## 2024-01-10 DIAGNOSIS — R293 Abnormal posture: Secondary | ICD-10-CM

## 2024-01-10 DIAGNOSIS — M542 Cervicalgia: Secondary | ICD-10-CM | POA: Diagnosis not present

## 2024-01-10 NOTE — Therapy (Signed)
 OUTPATIENT PHYSICAL THERAPY TREATMENT NOTE   Patient Name: [name] MRN: [id number] DOB:[birth year], 73 y.o., female Today's Date: 01/10/2024  END OF SESSION:  PT End of Session - 01/10/24 1401     Visit Number 3    Number of Visits 17    Date for PT Re-Evaluation 02/21/24    Authorization Type HTA    PT Start Time 0200    PT Stop Time 0245    PT Time Calculation (min) 45 min           Past Medical History:  Diagnosis Date   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   Anxiety    Asthma    as a child   CAP (community acquired pneumonia)    Cataract 2018   removed both eyes   COLONIC POLYPS, HX OF 11/15/2009   DEPRESSION 11/14/2009   GASTROJEJ ULCR UNS ACUT/CHRN W/O HEMOR PERF/OBST 11/06/2009   Generalized abdominal pain 12/02/2016   GERD (gastroesophageal reflux disease)    not on meds   Headache(784.0) 11/15/2009   Heart murmur    never bothered pt. always been told this   HEART MURMUR, HX OF 11/15/2009   HIATAL HERNIA 10/08/2009   Large   HIP FRACTURE, LEFT 11/14/2009   History of hiatal hernia    HYPERLIPIDEMIA 11/14/2009   HYPERTENSION 11/14/2009   Intractable nausea and vomiting 07/05/2023   OSTEOPOROSIS 11/14/2009   Prolonged Q-T interval on ECG 09/02/2021   WEIGHT GAIN 01/10/2010   Past Surgical History:  Procedure Laterality Date   APPENDECTOMY     AUGMENTATION MAMMAPLASTY Bilateral    BACK SURGERY  2003   Bleeding ulcers  10/2009   Broken Hip Left 06/2001   CATARACT EXTRACTION Bilateral 01/2017   COLONOSCOPY N/A 12/30/2016   Procedure: COLONOSCOPY WITH PROPOFOL ;  Surgeon: Debby Hila, MD;  Location: [facility];  Service: Endoscopy;  Laterality: N/A;   COLONOSCOPY  2011   inadequate prep, polyps   EYE SURGERY     FRACTURE SURGERY     GASTROSTOMY N/A 09/15/2021   Procedure: INSERTION OF GASTROSTOMY TUBE;  Surgeon: Signe Mitzie LABOR, MD;  Location: [facility];  Service: General;  Laterality: N/A;   NECK SURGERY  2004 & 2006   x's 2    POLYPECTOMY     RECTAL PROLAPSE REPAIR  01/2017   SMALL INTESTINE SURGERY     SPINE SURGERY     XI ROBOTIC ASSISTED PARAESOPHAGEAL HERNIA REPAIR N/A 09/15/2021   Procedure: XI ROBOTIC ASSISTED PARAESOPHAGEAL HERNIA REPAIR WITH GASTROPEXY;  Surgeon: Signe Mitzie LABOR, MD;  Location: [facility];  Service: General;  Laterality: N/A;   Patient Active Problem List   Diagnosis Date Noted   Cervical myofascial pain syndrome 10/11/2023   Family history of migraine headaches 07/06/2023   Leukocytosis 07/06/2023   High anion gap metabolic acidosis 07/06/2023   Intractable nausea and vomiting 07/05/2023   Cerumen impaction 04/09/2023   High risk medication use 06/12/2022   Insomnia 02/18/2022   S/P repair of paraesophageal hernia 09/15/2021   Prediabetes 04/03/2021   Decreased renal function 03/21/2019   Preventative health care 02/25/2018   Asthma 06/24/2017   Osteoarthritis 05/25/2017   Rectal prolapse 12/31/2016   Medicare annual wellness visit, subsequent 12/02/2016   Chronic pain 05/26/2016   Chronic migraine without aura without status migrainosus, not intractable 01/12/2016   Rectal discomfort 12/01/2012   Hemorrhoids 12/01/2012   History of colonic polyps 11/15/2009   Hyperlipidemia 11/14/2009   Anxiety and depression 11/14/2009   Essential hypertension 11/14/2009  Osteopenia 11/14/2009   Gastrojejunal ulcer 11/06/2009   HIATAL HERNIA 10/08/2009    PCP: Gretta Comer POUR, NP   REFERRING PROVIDER: Cornelio Bouchard, MD  REFERRING DIAG: [phone] (ICD-10-CM) - Cervical myofascial pain syndrome   THERAPY DIAG:  Cervicalgia  Muscle weakness (generalized)  Abnormal posture  Rationale for Evaluation and Treatment: Rehabilitation  ONSET DATE: Chronic  SUBJECTIVE:   SUBJECTIVE STATEMENT: Last week I noticed that I had trouble opening my mouth and I have been having trouble swallowing for awhile now. I have been doing the exercises and I do get a little relief. So I do them 2 x  per day. 6/10 pain in neck now.   Has been compliant with HEP, no marked change in symptoms to note yet.  PERTINENT HISTORY: HTN, Osteoporosis   PAIN:  Are you having pain?  Yes: NPRS scale: 6/10 Worst: 10/10 Pain location: sub occipitals, bilateral upper trap  Pain description: sore, tight Aggravating factors: reading, driving, needle-point  Relieving factors: heat  PRECAUTIONS: None  RED FLAGS: None   WEIGHT BEARING RESTRICTIONS: No  FALLS:  Has patient fallen in last 6 months? No  LIVING ENVIRONMENT: Lives with: lives with their family Lives in: House/apartment  OCCUPATION: Retired  PLOF: Independent  PATIENT GOALS: decrease neck pain, be able to knit and read without pain  NEXT MD VISIT: 02/28/2024  OBJECTIVE:  Note: Objective measures were completed at Evaluation unless otherwise noted.  DIAGNOSTIC FINDINGS: See imaging   PATIENT SURVEYS:  NDI:  NECK DISABILITY INDEX  Date: 12/27/2023 Score  Pain intensity 3 = The pain is fairly severe at the moment  2. Personal care (washing, dressing, etc.) 0 = I can look after myself normally without causing extra pain  3. Lifting 1 =  I can lift heavy weights but it gives extra pain  4. Reading 3 = I can't read as much as I want because of moderate pain in my neck  5. Headaches 2 =  I have moderate headaches, which come infrequently  6. Concentration 3 = I have a lot of difficulty in concentrating when I want to  7. Work 0 =  I can do as much work as I want to  8. Driving 0 = I can drive my car without any neck pain  9. Sleeping 1 = My sleep is slightly disturbed (less than 1 hr sleepless)  10. Recreation 2 = I am able to engage in most, but not all of my usual recreation activities because of   pain in my neck  Total 15/50   Minimum Detectable Change (90% confidence): 5 points or 10% points  COGNITION: Overall cognitive status: Within functional limits for tasks assessed     SENSATION: WFL  POSTURE: rounded  shoulders, forward head, and increased thoracic kyphosis  PALPATION: TTP to bilateral suboccipitals, bilateral upper traps  CERVICAL ROM:   Active ROM A/PROM (deg) eval AROM 01/10/24  Flexion    Extension    Right lateral flexion    Left lateral flexion    Right rotation 40 50  Left rotation 40 40   (Blank rows = not tested)  UPPER EXTREMITY MMT:  MMT Right eval Left eval  Shoulder flexion    Shoulder extension    Shoulder abduction    Shoulder adduction    Shoulder extension    Shoulder internal rotation    Shoulder external rotation    Middle trapezius 2+ 2+  Lower trapezius 2+ 2+  Elbow flexion    Elbow  extension    Wrist flexion    Wrist extension    Wrist ulnar deviation    Wrist radial deviation    Wrist pronation    Wrist supination    Grip strength     (Blank rows = not tested)    FUNCTIONAL TESTS:  Cervical Flexor Endurance Test: 5 sec  GAIT: Distance walked: 20ft Assistive device utilized: None Level of assistance: Complete Independence Comments: flexed trunk   TREATMENT: [facility] Adult PT Treatment:                                                DATE: 01/10/24 Manual Therapy: B UT stretch B LS stretch  Cervical distraction Neuromuscular re-ed: Supine chin tuck 10 x 2 5 sec over towel  Supine hor abd YTB 15x B, 15/15 unilateral- head supported, maintaining slight chin tuck   Self Care: Sub occipital release -using tennis balls x 5 minutes    [facility] Adult PT Treatment:                                                DATE: 01/05/24 Therapeutic Exercise: Nustep L2 8 min Manual Therapy: B UT stretch 30s x2  B levator scapula stretch 30s x2 B scalene stretch 30s x3 Neuromuscular re-ed: Supine hor abd YTB 15x B, 15/15 unilateral Supine OH flexion 1# 15/15  [facility] Adult PT Treatment:                                                DATE: 12/27/2023 Therapeutic Exercise: Seated SNAG rot x 5 ea Seated row x 10 RTB Supine chin tuck x 5 - 5  hold Theracane demonstration  Manual Therapy: Suboccipital release Positional release bilateral upper trap  PATIENT EDUCATION:  Education details: eval findings, NDI, HEP, POC Person educated: Patient Education method: Explanation, Demonstration, and Handouts Education comprehension: verbalized understanding and returned demonstration  HOME EXERCISE PROGRAM: Access Code: [email or web address] URL: [email or web address] Date: 12/27/2023 Prepared by: [name] Kingdom  Exercises - Seated Assisted Cervical Rotation with Towel  - 1 x daily - 7 x weekly - 2 sets - 10 reps - 5 sec hold - Standing Shoulder Row with Anchored Resistance  - 1 x daily - 7 x weekly - 3 sets - 10 reps - red band hold - Supine Chin Tuck  - 1 x daily - 7 x weekly - 2 sets - 10 reps - 5 sec hold Added Horiz abdct supine  Tennis balls for sub occipital release   ASSESSMENT:  CLINICAL IMPRESSION: Second f/u session.  Patient compliant with HEP. She notes short term relief after HEP. Voices concern about difficulty swallowing and difficulty opening mouth wide enough to take normal bites. Recommended contacting MD.   Focus of treatment was HEP review, postural correction and manual stretching. Introduced sub occipital release using tennis balls. She felt decreased tension after treatment today.   Patient is a 73 y.o. F who was seen today for physical therapy evaluation and treatment for acute exacerbation of chronic neck pain. Physical findings are consistent with referring MD impression as pt demonstrates decrease  in cervical ROM, postural muscle weakness, and TTP to bilateral upper traps and suboccipitals. NDI score shows moderate disability in performance of home ADLs and community activities. Pt would benefit from skilled PT services working to decrease neck pain by improving postural muscle strength and manual therapy interventions.    OBJECTIVE IMPAIRMENTS: decreased activity tolerance, decreased mobility, decreased  ROM, decreased strength, postural dysfunction, and pain   ACTIVITY LIMITATIONS: carrying, lifting, sitting, standing, and reach over head  PARTICIPATION LIMITATIONS: meal prep, cleaning, driving, community activity, and knitting reading  PERSONAL FACTORS: Time since onset of injury/illness/exacerbation and 1-2 comorbidities: HTN, Osteoporosis  are also affecting patient's functional outcome.   REHAB POTENTIAL: Good  CLINICAL DECISION MAKING: Stable/uncomplicated  EVALUATION COMPLEXITY: Moderate   GOALS: Goals reviewed with patient? No  SHORT TERM GOALS: Target date: 01/17/2024   Pt will be compliant and knowledgeable with initial HEP for improved comfort and carryover Baseline: initial HEP given  Goal status: INITIAL  2.  Pt will self report neck pain no greater than 7/10 for improved comfort and functional ability Baseline: 10/10 at worst Goal status: INITIAL   LONG TERM GOALS: Target date: 02/21/2024   Pt will decrease NDI disability score to no greater than 20% (10/50) as proxy for functional improvement Baseline: 30% disability (15/50) Goal status: INITIAL  2.  Pt will self report neck pain no greater than 3/10 for improved comfort and functional ability Baseline: 10/10 at worst Goal status: INITIAL   3.  Pt will improve bilateral lower/middle trap strength to at least 3+ for improved postural endurance and decreased pain Baseline: 2+ Goal status: INITIAL  4.  Pt will improve bilateral cervical rotation AROM to at least 60 degrees for improved functional ability with driving and other community activities Baseline: see chart Goal status: INITIAL  5.  Pt will improve deep cervical flexor endurance time to at least 15 seconds for improved endurance and decreased neck pain Baseline: 5 sec Goal status: INITIAL  6.  Pt will be able to knit and read without increase in neck pain for improved comfort and function of desired activities  Baseline: unable Goal status:  INITIAL   PLAN:  PT FREQUENCY: 1-2x/week  PT DURATION: 8 weeks  PLANNED INTERVENTIONS: 97164- PT Re-evaluation, 97110-Therapeutic exercises, 97530- Therapeutic activity, 97112- Neuromuscular re-education, 97535- Self Care, [id number]- Manual therapy, [id number]- Gait training, [phone]- Electrical stimulation (unattended), [id number]- Electrical stimulation (manual), 20560 (1-2 muscles), 20561 (3+ muscles)- Dry Needling, Cryotherapy, and Moist heat  PLAN FOR NEXT SESSION: assess HEP response, DNF and periscapular strengthening, manual  Harlene Persons, PTA 01/10/24 3:55 PM Phone: [phone] Fax: [phone]

## 2024-01-14 ENCOUNTER — Encounter: Payer: Self-pay | Admitting: Physical Therapy

## 2024-01-14 ENCOUNTER — Ambulatory Visit: Admitting: Physical Therapy

## 2024-01-14 DIAGNOSIS — M542 Cervicalgia: Secondary | ICD-10-CM | POA: Diagnosis not present

## 2024-01-14 DIAGNOSIS — M6281 Muscle weakness (generalized): Secondary | ICD-10-CM

## 2024-01-14 DIAGNOSIS — R293 Abnormal posture: Secondary | ICD-10-CM

## 2024-01-14 NOTE — Therapy (Signed)
 OUTPATIENT PHYSICAL THERAPY TREATMENT NOTE   Patient Name: Alenah Sarria MRN: [id number] DOB:25-Apr-1951, 73 y.o., female Today's Date: 01/14/2024  END OF SESSION:  PT End of Session - 01/14/24 1318     Visit Number 4    Number of Visits 17    Date for PT Re-Evaluation 02/21/24    Authorization Type HTA    PT Start Time 1315    PT Stop Time 1353    PT Time Calculation (min) 38 min           Past Medical History:  Diagnosis Date   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   Anxiety    Asthma    as a child   CAP (community acquired pneumonia)    Cataract 2018   removed both eyes   COLONIC POLYPS, HX OF 11/15/2009   DEPRESSION 11/14/2009   GASTROJEJ ULCR UNS ACUT/CHRN W/O HEMOR PERF/OBST 11/06/2009   Generalized abdominal pain 12/02/2016   GERD (gastroesophageal reflux disease)    not on meds   Headache(784.0) 11/15/2009   Heart murmur    never bothered pt. always been told this   HEART MURMUR, HX OF 11/15/2009   HIATAL HERNIA 10/08/2009   Large   HIP FRACTURE, LEFT 11/14/2009   History of hiatal hernia    HYPERLIPIDEMIA 11/14/2009   HYPERTENSION 11/14/2009   Intractable nausea and vomiting 07/05/2023   OSTEOPOROSIS 11/14/2009   Prolonged Q-T interval on ECG 09/02/2021   WEIGHT GAIN 01/10/2010   Past Surgical History:  Procedure Laterality Date   APPENDECTOMY     AUGMENTATION MAMMAPLASTY Bilateral    BACK SURGERY  2003   Bleeding ulcers  10/2009   Broken Hip Left 06/2001   CATARACT EXTRACTION Bilateral 01/2017   COLONOSCOPY N/A 12/30/2016   Procedure: COLONOSCOPY WITH PROPOFOL ;  Surgeon: Debby Hila, MD;  Location: [facility];  Service: Endoscopy;  Laterality: N/A;   COLONOSCOPY  2011   inadequate prep, polyps   EYE SURGERY     FRACTURE SURGERY     GASTROSTOMY N/A 09/15/2021   Procedure: INSERTION OF GASTROSTOMY TUBE;  Surgeon: Signe Mitzie LABOR, MD;  Location: [facility];  Service: General;  Laterality: N/A;   NECK SURGERY  2004 & 2006   x's 2    POLYPECTOMY     RECTAL PROLAPSE REPAIR  01/2017   SMALL INTESTINE SURGERY     SPINE SURGERY     XI ROBOTIC ASSISTED PARAESOPHAGEAL HERNIA REPAIR N/A 09/15/2021   Procedure: XI ROBOTIC ASSISTED PARAESOPHAGEAL HERNIA REPAIR WITH GASTROPEXY;  Surgeon: Signe Mitzie LABOR, MD;  Location: [facility];  Service: General;  Laterality: N/A;   Patient Active Problem List   Diagnosis Date Noted   Cervical myofascial pain syndrome 10/11/2023   Family history of migraine headaches 07/06/2023   Leukocytosis 07/06/2023   High anion gap metabolic acidosis 07/06/2023   Intractable nausea and vomiting 07/05/2023   Cerumen impaction 04/09/2023   High risk medication use 06/12/2022   Insomnia 02/18/2022   S/P repair of paraesophageal hernia 09/15/2021   Prediabetes 04/03/2021   Decreased renal function 03/21/2019   Preventative health care 02/25/2018   Asthma 06/24/2017   Osteoarthritis 05/25/2017   Rectal prolapse 12/31/2016   Medicare annual wellness visit, subsequent 12/02/2016   Chronic pain 05/26/2016   Chronic migraine without aura without status migrainosus, not intractable 01/12/2016   Rectal discomfort 12/01/2012   Hemorrhoids 12/01/2012   History of colonic polyps 11/15/2009   Hyperlipidemia 11/14/2009   Anxiety and depression 11/14/2009   Essential hypertension 11/14/2009  Osteopenia 11/14/2009   Gastrojejunal ulcer 11/06/2009   HIATAL HERNIA 10/08/2009    PCP: Gretta Comer POUR, NP   REFERRING PROVIDER: Cornelio Bouchard, MD  REFERRING DIAG: [id number] (ICD-10-CM) - Cervical myofascial pain syndrome   THERAPY DIAG:  Cervicalgia  Muscle weakness (generalized)  Abnormal posture  Rationale for Evaluation and Treatment: Rehabilitation  ONSET DATE: Chronic  SUBJECTIVE:   SUBJECTIVE STATEMENT: Last week I noticed that I had trouble opening my mouth and I have been having trouble swallowing for awhile now. I have been doing the exercises and I do get a little relief. So I do them 2 x  per day. 6/10 pain in neck now.   Has been compliant with HEP, no marked change in symptoms to note yet.  PERTINENT HISTORY: HTN, Osteoporosis   PAIN:  Are you having pain?  Yes: NPRS scale: 6/10 Worst: 10/10 Pain location: sub occipitals, bilateral upper trap  Pain description: sore, tight Aggravating factors: reading, driving, needle-point  Relieving factors: heat  PRECAUTIONS: None  RED FLAGS: None   WEIGHT BEARING RESTRICTIONS: No  FALLS:  Has patient fallen in last 6 months? No  LIVING ENVIRONMENT: Lives with: lives with their family Lives in: House/apartment  OCCUPATION: Retired  PLOF: Independent  PATIENT GOALS: decrease neck pain, be able to knit and read without pain  NEXT MD VISIT: 02/28/2024  OBJECTIVE:  Note: Objective measures were completed at Evaluation unless otherwise noted.  DIAGNOSTIC FINDINGS: See imaging   PATIENT SURVEYS:  NDI:  NECK DISABILITY INDEX  Date: 12/27/2023 Score  Pain intensity 3 = The pain is fairly severe at the moment  2. Personal care (washing, dressing, etc.) 0 = I can look after myself normally without causing extra pain  3. Lifting 1 =  I can lift heavy weights but it gives extra pain  4. Reading 3 = I can't read as much as I want because of moderate pain in my neck  5. Headaches 2 =  I have moderate headaches, which come infrequently  6. Concentration 3 = I have a lot of difficulty in concentrating when I want to  7. Work 0 =  I can do as much work as I want to  8. Driving 0 = I can drive my car without any neck pain  9. Sleeping 1 = My sleep is slightly disturbed (less than 1 hr sleepless)  10. Recreation 2 = I am able to engage in most, but not all of my usual recreation activities because of   pain in my neck  Total 15/50   Minimum Detectable Change (90% confidence): 5 points or 10% points  COGNITION: Overall cognitive status: Within functional limits for tasks assessed     SENSATION: WFL  POSTURE: rounded  shoulders, forward head, and increased thoracic kyphosis  PALPATION: TTP to bilateral suboccipitals, bilateral upper traps  CERVICAL ROM:   Active ROM A/PROM (deg) eval AROM 01/10/24  Flexion    Extension    Right lateral flexion    Left lateral flexion    Right rotation 40 50  Left rotation 40 40   (Blank rows = not tested)  UPPER EXTREMITY MMT:  MMT Right eval Left eval  Shoulder flexion    Shoulder extension    Shoulder abduction    Shoulder adduction    Shoulder extension    Shoulder internal rotation    Shoulder external rotation    Middle trapezius 2+ 2+  Lower trapezius 2+ 2+  Elbow flexion    Elbow  extension    Wrist flexion    Wrist extension    Wrist ulnar deviation    Wrist radial deviation    Wrist pronation    Wrist supination    Grip strength     (Blank rows = not tested)    FUNCTIONAL TESTS:  Cervical Flexor Endurance Test: 5 sec  GAIT: Distance walked: 42ft Assistive device utilized: None Level of assistance: Complete Independence Comments: flexed trunk   TREATMENT: [facility] Adult PT Treatment:                                                DATE: 01/14/24 Therapeutic Exercise: Row RTB Horiz abdct RTB standing Star pattern YTB standing  Chin tuck at wall- ball on wall  Holding chin tuck with ball at wall- dowel pull up to 90 deg shoulder flexion  Supine chin tuck with dowel pullovers x 10  Supine chin tuck with arm circles x 10 each way  Supine chin tuck horiz abdct  Supine chin tuck with alt flex/ext of UE Manual Therapy: B UT stretch 30s x2  B levator scapula stretch 30s x 2 Sub occipital release , cervical traction    [facility] Adult PT Treatment:                                                DATE: 01/10/24 Manual Therapy: B UT stretch B LS stretch  Cervical distraction Neuromuscular re-ed: Supine chin tuck 10 x 2 5 sec over towel  Supine hor abd YTB 15x B, 15/15 unilateral- head supported, maintaining slight chin tuck   Self  Care: Sub occipital release -using tennis balls x 5 minutes    [facility] Adult PT Treatment:                                                DATE: 01/05/24 Therapeutic Exercise: Nustep L2 8 min Manual Therapy: B UT stretch 30s x2  B levator scapula stretch 30s x2 B scalene stretch 30s x3 Neuromuscular re-ed: Supine hor abd YTB 15x B, 15/15 unilateral Supine OH flexion 1# 15/15  [facility] Adult PT Treatment:                                                DATE: 12/27/2023 Therapeutic Exercise: Seated SNAG rot x 5 ea Seated row x 10 RTB Supine chin tuck x 5 - 5 hold Theracane demonstration  Manual Therapy: Suboccipital release Positional release bilateral upper trap  PATIENT EDUCATION:  Education details: eval findings, NDI, HEP, POC Person educated: Patient Education method: Explanation, Demonstration, and Handouts Education comprehension: verbalized understanding and returned demonstration  HOME EXERCISE PROGRAM: Access Code: [email or web address] URL: [email or web address] Date: 12/27/2023 Prepared by: [name] Kingdom  Exercises - Seated Assisted Cervical Rotation with Towel  - 1 x daily - 7 x weekly - 2 sets - 10 reps - 5 sec hold - Standing Shoulder Row with Anchored Resistance  - 1 x daily - 7 x  weekly - 3 sets - 10 reps - red band hold - Supine Chin Tuck  - 1 x daily - 7 x weekly - 2 sets - 10 reps - 5 sec hold Added Horiz abdct supine  Tennis balls for sub occipital release   ASSESSMENT:  CLINICAL IMPRESSION: third f/u session.  Patient compliant with HEP. She notes short term relief after HEP but continued pain. Attempted suboccipital release at home however reports she cannot get the right spots, points to mastoid process most painful areas. She responded well to manual positional stretches today, requesting more of those at future sessions. Session focused on manual, Rom, and cervical stability. She will see MD next week regarding difficulty with opening mouth intermittently  and difficulty swallowing.    Patient is a 73 y.o. F who was seen today for physical therapy evaluation and treatment for acute exacerbation of chronic neck pain. Physical findings are consistent with referring MD impression as pt demonstrates decrease in cervical ROM, postural muscle weakness, and TTP to bilateral upper traps and suboccipitals. NDI score shows moderate disability in performance of home ADLs and community activities. Pt would benefit from skilled PT services working to decrease neck pain by improving postural muscle strength and manual therapy interventions.    OBJECTIVE IMPAIRMENTS: decreased activity tolerance, decreased mobility, decreased ROM, decreased strength, postural dysfunction, and pain   ACTIVITY LIMITATIONS: carrying, lifting, sitting, standing, and reach over head  PARTICIPATION LIMITATIONS: meal prep, cleaning, driving, community activity, and knitting reading  PERSONAL FACTORS: Time since onset of injury/illness/exacerbation and 1-2 comorbidities: HTN, Osteoporosis  are also affecting patient's functional outcome.   REHAB POTENTIAL: Good  CLINICAL DECISION MAKING: Stable/uncomplicated  EVALUATION COMPLEXITY: Moderate   GOALS: Goals reviewed with patient? No  SHORT TERM GOALS: Target date: 01/17/2024   Pt will be compliant and knowledgeable with initial HEP for improved comfort and carryover Baseline: initial HEP given  Goal status: INITIAL  2.  Pt will self report neck pain no greater than 7/10 for improved comfort and functional ability Baseline: 10/10 at worst 01/14/24: 6/10 Goal status: INITIAL   LONG TERM GOALS: Target date: 02/21/2024   Pt will decrease NDI disability score to no greater than 20% (10/50) as proxy for functional improvement Baseline: 30% disability (15/50) Goal status: INITIAL  2.  Pt will self report neck pain no greater than 3/10 for improved comfort and functional ability Baseline: 10/10 at worst Goal status: INITIAL    3.  Pt will improve bilateral lower/middle trap strength to at least 3+ for improved postural endurance and decreased pain Baseline: 2+ Goal status: INITIAL  4.  Pt will improve bilateral cervical rotation AROM to at least 60 degrees for improved functional ability with driving and other community activities Baseline: see chart Goal status: INITIAL  5.  Pt will improve deep cervical flexor endurance time to at least 15 seconds for improved endurance and decreased neck pain Baseline: 5 sec Goal status: INITIAL  6.  Pt will be able to knit and read without increase in neck pain for improved comfort and function of desired activities  Baseline: unable Goal status: INITIAL   PLAN:  PT FREQUENCY: 1-2x/week  PT DURATION: 8 weeks  PLANNED INTERVENTIONS: 97164- PT Re-evaluation, 97110-Therapeutic exercises, 97530- Therapeutic activity, [id number]- Neuromuscular re-education, 97535- Self Care, [id number]- Manual therapy, [id number]- Gait training, [id number]- Electrical stimulation (unattended), [id number]- Electrical stimulation (manual), 20560 (1-2 muscles), 20561 (3+ muscles)- Dry Needling, Cryotherapy, and Moist heat  PLAN FOR NEXT SESSION: assess HEP response, DNF and  periscapular strengthening, manual, pt likes passive stretching and manual   Harlene Persons, PTA 01/14/24 2:03 PM Phone: [phone] Fax: [phone]

## 2024-01-18 ENCOUNTER — Ambulatory Visit

## 2024-01-18 DIAGNOSIS — R293 Abnormal posture: Secondary | ICD-10-CM

## 2024-01-18 DIAGNOSIS — M6281 Muscle weakness (generalized): Secondary | ICD-10-CM

## 2024-01-18 DIAGNOSIS — M542 Cervicalgia: Secondary | ICD-10-CM | POA: Diagnosis not present

## 2024-01-18 NOTE — Therapy (Signed)
 OUTPATIENT PHYSICAL THERAPY TREATMENT NOTE   Patient Name: [name] MRN: [id number] DOB:[birth year], 73 y.o., female Today's Date: 01/18/2024  END OF SESSION:  PT End of Session - 01/18/24 1314     Visit Number 5    Number of Visits 17    Date for PT Re-Evaluation 02/21/24    Authorization Type HTA    PT Start Time 1315    PT Stop Time 1355    PT Time Calculation (min) 40 min            Past Medical History:  Diagnosis Date   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   Anxiety    Asthma    as a child   CAP (community acquired pneumonia)    Cataract 2018   removed both eyes   COLONIC POLYPS, HX OF 11/15/2009   DEPRESSION 11/14/2009   GASTROJEJ ULCR UNS ACUT/CHRN W/O HEMOR PERF/OBST 11/06/2009   Generalized abdominal pain 12/02/2016   GERD (gastroesophageal reflux disease)    not on meds   Headache(784.0) 11/15/2009   Heart murmur    never bothered pt. always been told this   HEART MURMUR, HX OF 11/15/2009   HIATAL HERNIA 10/08/2009   Large   HIP FRACTURE, LEFT 11/14/2009   History of hiatal hernia    HYPERLIPIDEMIA 11/14/2009   HYPERTENSION 11/14/2009   Intractable nausea and vomiting 07/05/2023   OSTEOPOROSIS 11/14/2009   Prolonged Q-T interval on ECG 09/02/2021   WEIGHT GAIN 01/10/2010   Past Surgical History:  Procedure Laterality Date   APPENDECTOMY     AUGMENTATION MAMMAPLASTY Bilateral    BACK SURGERY  2003   Bleeding ulcers  10/2009   Broken Hip Left 06/2001   CATARACT EXTRACTION Bilateral 01/2017   COLONOSCOPY N/A 12/30/2016   Procedure: COLONOSCOPY WITH PROPOFOL ;  Surgeon: Debby Hila, MD;  Location: [facility];  Service: Endoscopy;  Laterality: N/A;   COLONOSCOPY  2011   inadequate prep, polyps   EYE SURGERY     FRACTURE SURGERY     GASTROSTOMY N/A 09/15/2021   Procedure: INSERTION OF GASTROSTOMY TUBE;  Surgeon: Signe Mitzie LABOR, MD;  Location: [facility];  Service: General;  Laterality: N/A;   NECK SURGERY  2004 & 2006   x's 2    POLYPECTOMY     RECTAL PROLAPSE REPAIR  01/2017   SMALL INTESTINE SURGERY     SPINE SURGERY     XI ROBOTIC ASSISTED PARAESOPHAGEAL HERNIA REPAIR N/A 09/15/2021   Procedure: XI ROBOTIC ASSISTED PARAESOPHAGEAL HERNIA REPAIR WITH GASTROPEXY;  Surgeon: Signe Mitzie LABOR, MD;  Location: [facility];  Service: General;  Laterality: N/A;   Patient Active Problem List   Diagnosis Date Noted   Cervical myofascial pain syndrome 10/11/2023   Family history of migraine headaches 07/06/2023   Leukocytosis 07/06/2023   High anion gap metabolic acidosis 07/06/2023   Intractable nausea and vomiting 07/05/2023   Cerumen impaction 04/09/2023   High risk medication use 06/12/2022   Insomnia 02/18/2022   S/P repair of paraesophageal hernia 09/15/2021   Prediabetes 04/03/2021   Decreased renal function 03/21/2019   Preventative health care 02/25/2018   Asthma 06/24/2017   Osteoarthritis 05/25/2017   Rectal prolapse 12/31/2016   Medicare annual wellness visit, subsequent 12/02/2016   Chronic pain 05/26/2016   Chronic migraine without aura without status migrainosus, not intractable 01/12/2016   Rectal discomfort 12/01/2012   Hemorrhoids 12/01/2012   History of colonic polyps 11/15/2009   Hyperlipidemia 11/14/2009   Anxiety and depression 11/14/2009   Essential hypertension  11/14/2009   Osteopenia 11/14/2009   Gastrojejunal ulcer 11/06/2009   HIATAL HERNIA 10/08/2009    PCP: Gretta Comer POUR, NP   REFERRING PROVIDER: Cornelio Bouchard, MD  REFERRING DIAG: [phone] (ICD-10-CM) - Cervical myofascial pain syndrome   THERAPY DIAG:  Cervicalgia  Muscle weakness (generalized)  Abnormal posture  Rationale for Evaluation and Treatment: Rehabilitation  ONSET DATE: Chronic  SUBJECTIVE:   SUBJECTIVE STATEMENT: Pt presents to PT with continued 5/10 pain. Has been compliant with HEP.   PERTINENT HISTORY: HTN, Osteoporosis   PAIN:  Are you having pain?  Yes: NPRS scale: 6/10 Worst: 10/10 Pain  location: sub occipitals, bilateral upper trap  Pain description: sore, tight Aggravating factors: reading, driving, needle-point  Relieving factors: heat  PRECAUTIONS: None  RED FLAGS: None   WEIGHT BEARING RESTRICTIONS: No  FALLS:  Has patient fallen in last 6 months? No  LIVING ENVIRONMENT: Lives with: lives with their family Lives in: House/apartment  OCCUPATION: Retired  PLOF: Independent  PATIENT GOALS: decrease neck pain, be able to knit and read without pain  NEXT MD VISIT: 02/28/2024  OBJECTIVE:  Note: Objective measures were completed at Evaluation unless otherwise noted.  DIAGNOSTIC FINDINGS: See imaging   PATIENT SURVEYS:  NDI:  NECK DISABILITY INDEX  Date: 12/27/2023 Score  Pain intensity 3 = The pain is fairly severe at the moment  2. Personal care (washing, dressing, etc.) 0 = I can look after myself normally without causing extra pain  3. Lifting 1 =  I can lift heavy weights but it gives extra pain  4. Reading 3 = I can't read as much as I want because of moderate pain in my neck  5. Headaches 2 =  I have moderate headaches, which come infrequently  6. Concentration 3 = I have a lot of difficulty in concentrating when I want to  7. Work 0 =  I can do as much work as I want to  8. Driving 0 = I can drive my car without any neck pain  9. Sleeping 1 = My sleep is slightly disturbed (less than 1 hr sleepless)  10. Recreation 2 = I am able to engage in most, but not all of my usual recreation activities because of   pain in my neck  Total 15/50   Minimum Detectable Change (90% confidence): 5 points or 10% points  COGNITION: Overall cognitive status: Within functional limits for tasks assessed     SENSATION: WFL  POSTURE: rounded shoulders, forward head, and increased thoracic kyphosis  PALPATION: TTP to bilateral suboccipitals, bilateral upper traps  CERVICAL ROM:   Active ROM A/PROM (deg) eval AROM 01/10/24  Flexion    Extension    Right  lateral flexion    Left lateral flexion    Right rotation 40 50  Left rotation 40 40   (Blank rows = not tested)  UPPER EXTREMITY MMT:  MMT Right eval Left eval  Shoulder flexion    Shoulder extension    Shoulder abduction    Shoulder adduction    Shoulder extension    Shoulder internal rotation    Shoulder external rotation    Middle trapezius 2+ 2+  Lower trapezius 2+ 2+  Elbow flexion    Elbow extension    Wrist flexion    Wrist extension    Wrist ulnar deviation    Wrist radial deviation    Wrist pronation    Wrist supination    Grip strength     (Blank rows =  not tested)    FUNCTIONAL TESTS:  Cervical Flexor Endurance Test: 5 sec  GAIT: Distance walked: 40ft Assistive device utilized: None Level of assistance: Complete Independence Comments: flexed trunk   TREATMENT: [facility] Adult PT Treatment:                                                DATE: 01/18/24 Therapeutic Exercise: Supine chin x 10 - 5 hold Supine horizontal abd 2x10 RTB Supine dow flexion 2x10 Row 3x10 GTB Seated thoracic ext over foam 2x15 Seated bilateral ER 2x15 RTB Manual Therapy: Suboccipital release Positional release upper trap  [facility] Adult PT Treatment:                                                DATE: 01/14/24 Therapeutic Exercise: Row RTB Horiz abdct RTB standing Star pattern YTB standing  Chin tuck at wall- ball on wall  Holding chin tuck with ball at wall- dowel pull up to 90 deg shoulder flexion  Supine chin tuck with dowel pullovers x 10  Supine chin tuck with arm circles x 10 each way  Supine chin tuck horiz abdct  Supine chin tuck with alt flex/ext of UE Manual Therapy: B UT stretch 30s x2  B levator scapula stretch 30s x 2 Sub occipital release , cervical traction  [facility] Adult PT Treatment:                                                DATE: 01/10/24 Manual Therapy: B UT stretch B LS stretch  Cervical distraction Neuromuscular re-ed: Supine chin tuck 10 x 2 5  sec over towel  Supine hor abd YTB 15x B, 15/15 unilateral- head supported, maintaining slight chin tuck   Self Care: Sub occipital release -using tennis balls x 5 minutes    [facility] Adult PT Treatment:                                                DATE: 01/05/24 Therapeutic Exercise: Nustep L2 8 min Manual Therapy: B UT stretch 30s x2  B levator scapula stretch 30s x2 B scalene stretch 30s x3 Neuromuscular re-ed: Supine hor abd YTB 15x B, 15/15 unilateral Supine OH flexion 1# 15/15  [facility] Adult PT Treatment:                                                DATE: 12/27/2023 Therapeutic Exercise: Seated SNAG rot x 5 ea Seated row x 10 RTB Supine chin tuck x 5 - 5 hold Theracane demonstration  Manual Therapy: Suboccipital release Positional release bilateral upper trap  PATIENT EDUCATION:  Education details: eval findings, NDI, HEP, POC Person educated: Patient Education method: Explanation, Demonstration, and Handouts Education comprehension: verbalized understanding and returned demonstration  HOME EXERCISE PROGRAM: Access Code: [email or web address] URL: [email or web address] Date: 12/27/2023  Prepared by: [name] Kingdom  Exercises - Seated Assisted Cervical Rotation with Towel  - 1 x daily - 7 x weekly - 2 sets - 10 reps - 5 sec hold - Standing Shoulder Row with Anchored Resistance  - 1 x daily - 7 x weekly - 3 sets - 10 reps - red band hold - Supine Chin Tuck  - 1 x daily - 7 x weekly - 2 sets - 10 reps - 5 sec hold Added Horiz abdct supine  Tennis balls for sub occipital release   ASSESSMENT:  CLINICAL IMPRESSION:  Pt was able to complete prescribed exercises focused on improving posture and neck discomfort. Responded fair to manual therapy interventions but noted no change in pain. Discussed TPDN which we will try next session. Will continue per POC and progress as able.   EVAL: Patient is a 73 y.o. F who was seen today for physical therapy evaluation and treatment  for acute exacerbation of chronic neck pain. Physical findings are consistent with referring MD impression as pt demonstrates decrease in cervical ROM, postural muscle weakness, and TTP to bilateral upper traps and suboccipitals. NDI score shows moderate disability in performance of home ADLs and community activities. Pt would benefit from skilled PT services working to decrease neck pain by improving postural muscle strength and manual therapy interventions.    OBJECTIVE IMPAIRMENTS: decreased activity tolerance, decreased mobility, decreased ROM, decreased strength, postural dysfunction, and pain   ACTIVITY LIMITATIONS: carrying, lifting, sitting, standing, and reach over head  PARTICIPATION LIMITATIONS: meal prep, cleaning, driving, community activity, and knitting reading  PERSONAL FACTORS: Time since onset of injury/illness/exacerbation and 1-2 comorbidities: HTN, Osteoporosis  are also affecting patient's functional outcome.   REHAB POTENTIAL: Good  CLINICAL DECISION MAKING: Stable/uncomplicated  EVALUATION COMPLEXITY: Moderate   GOALS: Goals reviewed with patient? No  SHORT TERM GOALS: Target date: 01/17/2024   Pt will be compliant and knowledgeable with initial HEP for improved comfort and carryover Baseline: initial HEP given  Goal status: INITIAL  2.  Pt will self report neck pain no greater than 7/10 for improved comfort and functional ability Baseline: 10/10 at worst 01/14/24: 6/10 Goal status: INITIAL   LONG TERM GOALS: Target date: 02/21/2024   Pt will decrease NDI disability score to no greater than 20% (10/50) as proxy for functional improvement Baseline: 30% disability (15/50) Goal status: INITIAL  2.  Pt will self report neck pain no greater than 3/10 for improved comfort and functional ability Baseline: 10/10 at worst Goal status: INITIAL   3.  Pt will improve bilateral lower/middle trap strength to at least 3+ for improved postural endurance and decreased  pain Baseline: 2+ Goal status: INITIAL  4.  Pt will improve bilateral cervical rotation AROM to at least 60 degrees for improved functional ability with driving and other community activities Baseline: see chart Goal status: INITIAL  5.  Pt will improve deep cervical flexor endurance time to at least 15 seconds for improved endurance and decreased neck pain Baseline: 5 sec Goal status: INITIAL  6.  Pt will be able to knit and read without increase in neck pain for improved comfort and function of desired activities  Baseline: unable Goal status: INITIAL   PLAN:  PT FREQUENCY: 1-2x/week  PT DURATION: 8 weeks  PLANNED INTERVENTIONS: 97164- PT Re-evaluation, 97110-Therapeutic exercises, 97530- Therapeutic activity, [id number]- Neuromuscular re-education, 97535- Self Care, [id number]- Manual therapy, [id number]- Gait training, H9716- Electrical stimulation (unattended), [id number]- Electrical stimulation (manual), 20560 (1-2 muscles), 20561 (3+ muscles)- Dry Needling,  Cryotherapy, and Moist heat  PLAN FOR NEXT SESSION: assess HEP response, DNF and periscapular strengthening, manual, pt likes passive stretching and manual   [name] JAYSON Kingdom PT  01/18/24 3:54 PM

## 2024-01-19 ENCOUNTER — Encounter: Payer: Self-pay | Admitting: Physical Medicine and Rehabilitation

## 2024-01-19 ENCOUNTER — Encounter: Attending: Physical Medicine and Rehabilitation | Admitting: Physical Medicine and Rehabilitation

## 2024-01-19 VITALS — BP 104/68 | HR 83 | Ht 60.0 in | Wt 107.0 lb

## 2024-01-19 DIAGNOSIS — G43709 Chronic migraine without aura, not intractable, without status migrainosus: Secondary | ICD-10-CM | POA: Insufficient documentation

## 2024-01-19 DIAGNOSIS — M542 Cervicalgia: Secondary | ICD-10-CM

## 2024-01-19 DIAGNOSIS — M7918 Myalgia, other site: Secondary | ICD-10-CM

## 2024-01-19 DIAGNOSIS — G8921 Chronic pain due to trauma: Secondary | ICD-10-CM | POA: Diagnosis not present

## 2024-01-19 MED ORDER — LIDOCAINE HCL 1 % IJ SOLN
6.0000 mL | Freq: Once | INTRAMUSCULAR | Status: AC
  Administered 2024-01-19: 6 mL

## 2024-01-19 MED ORDER — TIZANIDINE HCL 4 MG PO TABS: 4.0000 mg | ORAL_TABLET | Freq: Four times a day (QID) | ORAL | 5 refills | Status: DC | PRN

## 2024-01-19 NOTE — Progress Notes (Signed)
 Pt is a 73 yr old female with hx of HTN, HLD;  insomnia and Depression;  Migraines- hx of bleeding ulcers- and subacute neck pain on chronic neck pain- with myofascial pain.     Sent for f/u  of neck pain      PT has helped some- doing HEP every day- some extra sets daily.  Has another appt tomorrow- but still in a lot of pain.     Have tried Flexeril - takes every once in awhile.  Has a very high tolerance for meds- and doesn't help much.   Thinks trp Injections were helpful-  Didn't last a couple of weeks- Hoping this next round would be helpful.   Last neck surgery was 20+ years ago  Also Jaw started 2 weeks ago Can barely open mouth - couldn't get mouth open enough to get fork into mouth.  Even this AM, couldn't open enough.     Plan: Went over and demonstrated trigger point/myofascial work on jaw- so pinch inside lateral jaw from inside and outside- 2+ minutes each side- and roll down for lower jaw and up for upper jaw to relax the gum line.   2. Patient here for trigger point injections for  Consent done and on chart.  Cleaned areas with alcohol and injected using a 27 gauge 1.5 inch needle  Injected  6cc none wasted Using 1% Lidocaine  with no EPI  Upper traps- B/L x2 Levators- B/L x2 Posterior scalenes- B/L x2 Middle scalenes- B/L x2 Splenius Capitus- B/L x2 Pectoralis Major- B/L  Rhomboids- B/L x2 Infraspinatus Teres Major/minor Thoracic paraspinals Lumbar paraspinals Other injections- B/L masseters   Patient's level of pain prior was bad Current level of pain after injections is- feels A LOT better- with improved ROM of neck- hardly hurts'  There was no bleeding or complications.  Patient was advised to drink a lot of water on day after injections to flush system Will have increased soreness for 12-48 hours after injections.  Can use Lidocaine  patches the day AFTER injections Can use theracane on day of injections in places didn't inject Can use  heating pad 4-6 hours AFTER injections  3. Tizanidine  4 mg 4x/day as needed for muscle spasms  4. F/U in 6 weeks- trP injections and f/u on pain   I spent a total of  28  minutes on total care today- >50% coordination of care- due to 6 minutes on injections-r est demonstrating jaw work and d/w pt options for muscle spasms

## 2024-01-19 NOTE — Patient Instructions (Signed)
  Plan: Went over and demonstrated trigger point/myofascial work on jaw- so pinch inside lateral jaw from inside and outside- 2+ minutes each side- and roll down for lower jaw and up for upper jaw to relax the gum line.   2. Patient here for trigger point injections for  Consent done and on chart.  Cleaned areas with alcohol and injected using a 27 gauge 1.5 inch needle  Injected  6cc none wasted Using 1% Lidocaine  with no EPI  Upper traps- B/L x2 Levators- B/L x2 Posterior scalenes- B/L x2 Middle scalenes- B/L x2 Splenius Capitus- B/L x2 Pectoralis Major- B/L  Rhomboids- B/L x2 Infraspinatus Teres Major/minor Thoracic paraspinals Lumbar paraspinals Other injections- B/L masseters   Patient's level of pain prior was bad Current level of pain after injections is- feels A LOT better- with improved ROM of neck- hardly hurts'  There was no bleeding or complications.  Patient was advised to drink a lot of water on day after injections to flush system Will have increased soreness for 12-48 hours after injections.  Can use Lidocaine  patches the day AFTER injections Can use theracane on day of injections in places didn't inject Can use heating pad 4-6 hours AFTER injections  3. Tizanidine  4 mg 4x/day as needed for muscle spasms  4. F/U in 6 weeks- trP injections and f/u on pain

## 2024-01-20 ENCOUNTER — Ambulatory Visit

## 2024-01-20 DIAGNOSIS — R293 Abnormal posture: Secondary | ICD-10-CM

## 2024-01-20 DIAGNOSIS — M542 Cervicalgia: Secondary | ICD-10-CM | POA: Diagnosis not present

## 2024-01-20 DIAGNOSIS — M6281 Muscle weakness (generalized): Secondary | ICD-10-CM

## 2024-01-21 NOTE — Therapy (Signed)
 OUTPATIENT PHYSICAL THERAPY TREATMENT NOTE   Patient Name: [name] MRN: [id number] DOB:12-31-1950, 73 y.o., female Today's Date: 01/21/2024  END OF SESSION:  PT End of Session - 01/21/24 0846     Visit Number 6    Number of Visits 17    Date for Recertification  02/21/24    Authorization Type HTA    PT Start Time 1403    PT Stop Time 1443    PT Time Calculation (min) 40 min    Activity Tolerance Patient tolerated treatment well    Behavior During Therapy [facility] for tasks assessed/performed            Past Medical History:  Diagnosis Date   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   Anxiety    Asthma    as a child   CAP (community acquired pneumonia)    Cataract 2018   removed both eyes   COLONIC POLYPS, HX OF 11/15/2009   DEPRESSION 11/14/2009   GASTROJEJ ULCR UNS ACUT/CHRN W/O HEMOR PERF/OBST 11/06/2009   Generalized abdominal pain 12/02/2016   GERD (gastroesophageal reflux disease)    not on meds   Headache(784.0) 11/15/2009   Heart murmur    never bothered pt. always been told this   HEART MURMUR, HX OF 11/15/2009   HIATAL HERNIA 10/08/2009   Large   HIP FRACTURE, LEFT 11/14/2009   History of hiatal hernia    HYPERLIPIDEMIA 11/14/2009   HYPERTENSION 11/14/2009   Intractable nausea and vomiting 07/05/2023   OSTEOPOROSIS 11/14/2009   Prolonged Q-T interval on ECG 09/02/2021   WEIGHT GAIN 01/10/2010   Past Surgical History:  Procedure Laterality Date   APPENDECTOMY     AUGMENTATION MAMMAPLASTY Bilateral    BACK SURGERY  2003   Bleeding ulcers  10/2009   Broken Hip Left 06/2001   CATARACT EXTRACTION Bilateral 01/2017   COLONOSCOPY N/A 12/30/2016   Procedure: COLONOSCOPY WITH PROPOFOL ;  Surgeon: Debby Hila, MD;  Location: [facility];  Service: Endoscopy;  Laterality: N/A;   COLONOSCOPY  2011   inadequate prep, polyps   EYE SURGERY     FRACTURE SURGERY     GASTROSTOMY N/A 09/15/2021   Procedure: INSERTION OF GASTROSTOMY TUBE;  Surgeon:  Signe Mitzie LABOR, MD;  Location: [facility];  Service: General;  Laterality: N/A;   NECK SURGERY  2004 & 2006   x's 2   POLYPECTOMY     RECTAL PROLAPSE REPAIR  01/2017   SMALL INTESTINE SURGERY     SPINE SURGERY     XI ROBOTIC ASSISTED PARAESOPHAGEAL HERNIA REPAIR N/A 09/15/2021   Procedure: XI ROBOTIC ASSISTED PARAESOPHAGEAL HERNIA REPAIR WITH GASTROPEXY;  Surgeon: Signe Mitzie LABOR, MD;  Location: [facility];  Service: General;  Laterality: N/A;   Patient Active Problem List   Diagnosis Date Noted   Cervical myofascial pain syndrome 10/11/2023   Family history of migraine headaches 07/06/2023   Leukocytosis 07/06/2023   High anion gap metabolic acidosis 07/06/2023   Intractable nausea and vomiting 07/05/2023   Cerumen impaction 04/09/2023   High risk medication use 06/12/2022   Insomnia 02/18/2022   S/P repair of paraesophageal hernia 09/15/2021   Prediabetes 04/03/2021   Decreased renal function 03/21/2019   Preventative health care 02/25/2018   Asthma 06/24/2017   Osteoarthritis 05/25/2017   Rectal prolapse 12/31/2016   Medicare annual wellness visit, subsequent 12/02/2016   Chronic pain 05/26/2016   Chronic migraine without aura without status migrainosus, not intractable 01/12/2016   Rectal discomfort 12/01/2012   Hemorrhoids 12/01/2012  History of colonic polyps 11/15/2009   Hyperlipidemia 11/14/2009   Anxiety and depression 11/14/2009   Essential hypertension 11/14/2009   Osteopenia 11/14/2009   Gastrojejunal ulcer 11/06/2009   HIATAL HERNIA 10/08/2009    PCP: Gretta Comer POUR, NP   REFERRING PROVIDER: Cornelio Bouchard, MD  REFERRING DIAG: M79.18 (ICD-10-CM) - Cervical myofascial pain syndrome   THERAPY DIAG:  Cervicalgia  Muscle weakness (generalized)  Abnormal posture  Rationale for Evaluation and Treatment: Rehabilitation  ONSET DATE: Chronic  SUBJECTIVE:   SUBJECTIVE STATEMENT: Pt presents to PT with decreased pain today after trigger point injections  by MD the previous day.   PERTINENT HISTORY: HTN, Osteoporosis   PAIN:  Are you having pain?  Yes: NPRS scale: 6/10 Worst: 10/10 Pain location: sub occipitals, bilateral upper trap  Pain description: sore, tight Aggravating factors: reading, driving, needle-point  Relieving factors: heat  PRECAUTIONS: None  RED FLAGS: None   WEIGHT BEARING RESTRICTIONS: No  FALLS:  Has patient fallen in last 6 months? No  LIVING ENVIRONMENT: Lives with: lives with their family Lives in: House/apartment  OCCUPATION: Retired  PLOF: Independent  PATIENT GOALS: decrease neck pain, be able to knit and read without pain  NEXT MD VISIT: 02/28/2024  OBJECTIVE:  Note: Objective measures were completed at Evaluation unless otherwise noted.  DIAGNOSTIC FINDINGS: See imaging   PATIENT SURVEYS:  NDI:  NECK DISABILITY INDEX  Date: 12/27/2023 Score  Pain intensity 3 = The pain is fairly severe at the moment  2. Personal care (washing, dressing, etc.) 0 = I can look after myself normally without causing extra pain  3. Lifting 1 =  I can lift heavy weights but it gives extra pain  4. Reading 3 = I can't read as much as I want because of moderate pain in my neck  5. Headaches 2 =  I have moderate headaches, which come infrequently  6. Concentration 3 = I have a lot of difficulty in concentrating when I want to  7. Work 0 =  I can do as much work as I want to  8. Driving 0 = I can drive my car without any neck pain  9. Sleeping 1 = My sleep is slightly disturbed (less than 1 hr sleepless)  10. Recreation 2 = I am able to engage in most, but not all of my usual recreation activities because of   pain in my neck  Total 15/50   Minimum Detectable Change (90% confidence): 5 points or 10% points  COGNITION: Overall cognitive status: Within functional limits for tasks assessed     SENSATION: WFL  POSTURE: rounded shoulders, forward head, and increased thoracic kyphosis  PALPATION: TTP to  bilateral suboccipitals, bilateral upper traps  CERVICAL ROM:   Active ROM A/PROM (deg) eval AROM 01/10/24  Flexion    Extension    Right lateral flexion    Left lateral flexion    Right rotation 40 50  Left rotation 40 40   (Blank rows = not tested)  UPPER EXTREMITY MMT:  MMT Right eval Left eval  Shoulder flexion    Shoulder extension    Shoulder abduction    Shoulder adduction    Shoulder extension    Shoulder internal rotation    Shoulder external rotation    Middle trapezius 2+ 2+  Lower trapezius 2+ 2+  Elbow flexion    Elbow extension    Wrist flexion    Wrist extension    Wrist ulnar deviation    Wrist radial  deviation    Wrist pronation    Wrist supination    Grip strength     (Blank rows = not tested)    FUNCTIONAL TESTS:  Cervical Flexor Endurance Test: 5 sec  GAIT: Distance walked: 52ft Assistive device utilized: None Level of assistance: Complete Independence Comments: flexed trunk   TREATMENT: [facility] Adult PT Treatment:                                                DATE: 01/18/24 Manual Therapy: Suboccipital release Positional release upper trap STM and TPR to bilateral SCMs and scalenes TPR to suboccipitals   [facility] Adult PT Treatment:                                                DATE: 01/18/24 Therapeutic Exercise: Supine chin x 10 - 5 hold Supine horizontal abd 2x10 RTB Supine dow flexion 2x10 Row 3x10 GTB Seated thoracic ext over foam 2x15 Seated bilateral ER 2x15 RTB Manual Therapy: Suboccipital release Positional release upper trap  [facility] Adult PT Treatment:                                                DATE: 01/14/24 Therapeutic Exercise: Row RTB Horiz abdct RTB standing Star pattern YTB standing  Chin tuck at wall- ball on wall  Holding chin tuck with ball at wall- dowel pull up to 90 deg shoulder flexion  Supine chin tuck with dowel pullovers x 10  Supine chin tuck with arm circles x 10 each way  Supine chin tuck horiz  abdct  Supine chin tuck with alt flex/ext of UE Manual Therapy: B UT stretch 30s x2  B levator scapula stretch 30s x 2 Sub occipital release , cervical traction  [facility] Adult PT Treatment:                                                DATE: 01/10/24 Manual Therapy: B UT stretch B LS stretch  Cervical distraction Neuromuscular re-ed: Supine chin tuck 10 x 2 5 sec over towel  Supine hor abd YTB 15x B, 15/15 unilateral- head supported, maintaining slight chin tuck   Self Care: Sub occipital release -using tennis balls x 5 minutes    [facility] Adult PT Treatment:                                                DATE: 01/05/24 Therapeutic Exercise: Nustep L2 8 min Manual Therapy: B UT stretch 30s x2  B levator scapula stretch 30s x2 B scalene stretch 30s x3 Neuromuscular re-ed: Supine hor abd YTB 15x B, 15/15 unilateral Supine OH flexion 1# 15/15  [facility] Adult PT Treatment:  DATE: 12/27/2023 Therapeutic Exercise: Seated SNAG rot x 5 ea Seated row x 10 RTB Supine chin tuck x 5 - 5 hold Theracane demonstration  Manual Therapy: Suboccipital release Positional release bilateral upper trap  PATIENT EDUCATION:  Education details: eval findings, NDI, HEP, POC Person educated: Patient Education method: Explanation, Demonstration, and Handouts Education comprehension: verbalized understanding and returned demonstration  HOME EXERCISE PROGRAM: Access Code: [email or web address] URL: [email or web address] Date: 12/27/2023 Prepared by: [name] Kingdom  Exercises - Seated Assisted Cervical Rotation with Towel  - 1 x daily - 7 x weekly - 2 sets - 10 reps - 5 sec hold - Standing Shoulder Row with Anchored Resistance  - 1 x daily - 7 x weekly - 3 sets - 10 reps - red band hold - Supine Chin Tuck  - 1 x daily - 7 x weekly - 2 sets - 10 reps - 5 sec hold Added Horiz abdct supine  Tennis balls for sub occipital release   ASSESSMENT:  CLINICAL  IMPRESSION:  Pt responded very well to PT today noting decrease in pain to 1/10 post session. We only focused on manual therapy today with STM and TPR to bilateral SCMs and scalenes which seemed to help greatly. Deferred TPDN today due to her receiving trigger point injections yesterday. She continues to benefit from skilled PT, will trial TPDN next session and continue to progress as able.   EVAL: Patient is a 73 y.o. F who was seen today for physical therapy evaluation and treatment for acute exacerbation of chronic neck pain. Physical findings are consistent with referring MD impression as pt demonstrates decrease in cervical ROM, postural muscle weakness, and TTP to bilateral upper traps and suboccipitals. NDI score shows moderate disability in performance of home ADLs and community activities. Pt would benefit from skilled PT services working to decrease neck pain by improving postural muscle strength and manual therapy interventions.    OBJECTIVE IMPAIRMENTS: decreased activity tolerance, decreased mobility, decreased ROM, decreased strength, postural dysfunction, and pain   ACTIVITY LIMITATIONS: carrying, lifting, sitting, standing, and reach over head  PARTICIPATION LIMITATIONS: meal prep, cleaning, driving, community activity, and knitting reading  PERSONAL FACTORS: Time since onset of injury/illness/exacerbation and 1-2 comorbidities: HTN, Osteoporosis  are also affecting patient's functional outcome.   REHAB POTENTIAL: Good  CLINICAL DECISION MAKING: Stable/uncomplicated  EVALUATION COMPLEXITY: Moderate   GOALS: Goals reviewed with patient? No  SHORT TERM GOALS: Target date: 01/17/2024   Pt will be compliant and knowledgeable with initial HEP for improved comfort and carryover Baseline: initial HEP given  Goal status: INITIAL  2.  Pt will self report neck pain no greater than 7/10 for improved comfort and functional ability Baseline: 10/10 at worst 01/14/24: 6/10 Goal status:  INITIAL   LONG TERM GOALS: Target date: 02/21/2024   Pt will decrease NDI disability score to no greater than 20% (10/50) as proxy for functional improvement Baseline: 30% disability (15/50) Goal status: INITIAL  2.  Pt will self report neck pain no greater than 3/10 for improved comfort and functional ability Baseline: 10/10 at worst Goal status: INITIAL   3.  Pt will improve bilateral lower/middle trap strength to at least 3+ for improved postural endurance and decreased pain Baseline: 2+ Goal status: INITIAL  4.  Pt will improve bilateral cervical rotation AROM to at least 60 degrees for improved functional ability with driving and other community activities Baseline: see chart Goal status: INITIAL  5.  Pt will improve deep cervical flexor endurance time to at least  15 seconds for improved endurance and decreased neck pain Baseline: 5 sec Goal status: INITIAL  6.  Pt will be able to knit and read without increase in neck pain for improved comfort and function of desired activities  Baseline: unable Goal status: INITIAL   PLAN:  PT FREQUENCY: 1-2x/week  PT DURATION: 8 weeks  PLANNED INTERVENTIONS: [id number]- PT Re-evaluation, 97110-Therapeutic exercises, 97530- Therapeutic activity, [id number]- Neuromuscular re-education, 97535- Self Care, [id number]- Manual therapy, [id number]- Gait training, [phone]- Electrical stimulation (unattended), [id number]- Electrical stimulation (manual), 20560 (1-2 muscles), 20561 (3+ muscles)- Dry Needling, Cryotherapy, and Moist heat  PLAN FOR NEXT SESSION: assess HEP response, DNF and periscapular strengthening, manual, pt likes passive stretching and manual   [name] JAYSON Kingdom PT  01/21/24 8:46 AM

## 2024-01-25 ENCOUNTER — Ambulatory Visit

## 2024-01-25 DIAGNOSIS — M542 Cervicalgia: Secondary | ICD-10-CM

## 2024-01-25 DIAGNOSIS — R293 Abnormal posture: Secondary | ICD-10-CM

## 2024-01-25 DIAGNOSIS — M6281 Muscle weakness (generalized): Secondary | ICD-10-CM

## 2024-01-25 NOTE — Therapy (Signed)
 OUTPATIENT PHYSICAL THERAPY TREATMENT NOTE   Patient Name: Kashvi Prevette MRN: [id number] DOB:08/20/1950, 73 y.o., female Today's Date: 01/26/2024  END OF SESSION:  PT End of Session - 01/25/24 1403     Visit Number 7    Number of Visits 17    Date for Recertification  02/21/24    Authorization Type HTA    PT Start Time 1401    PT Stop Time 1443    PT Time Calculation (min) 42 min    Activity Tolerance Patient tolerated treatment well    Behavior During Therapy [facility] for tasks assessed/performed             Past Medical History:  Diagnosis Date   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   Anxiety    Asthma    as a child   CAP (community acquired pneumonia)    Cataract 2018   removed both eyes   COLONIC POLYPS, HX OF 11/15/2009   DEPRESSION 11/14/2009   GASTROJEJ ULCR UNS ACUT/CHRN W/O HEMOR PERF/OBST 11/06/2009   Generalized abdominal pain 12/02/2016   GERD (gastroesophageal reflux disease)    not on meds   Headache(784.0) 11/15/2009   Heart murmur    never bothered pt. always been told this   HEART MURMUR, HX OF 11/15/2009   HIATAL HERNIA 10/08/2009   Large   HIP FRACTURE, LEFT 11/14/2009   History of hiatal hernia    HYPERLIPIDEMIA 11/14/2009   HYPERTENSION 11/14/2009   Intractable nausea and vomiting 07/05/2023   OSTEOPOROSIS 11/14/2009   Prolonged Q-T interval on ECG 09/02/2021   WEIGHT GAIN 01/10/2010   Past Surgical History:  Procedure Laterality Date   APPENDECTOMY     AUGMENTATION MAMMAPLASTY Bilateral    BACK SURGERY  2003   Bleeding ulcers  10/2009   Broken Hip Left 06/2001   CATARACT EXTRACTION Bilateral 01/2017   COLONOSCOPY N/A 12/30/2016   Procedure: COLONOSCOPY WITH PROPOFOL ;  Surgeon: Debby Hila, MD;  Location: [facility];  Service: Endoscopy;  Laterality: N/A;   COLONOSCOPY  2011   inadequate prep, polyps   EYE SURGERY     FRACTURE SURGERY     GASTROSTOMY N/A 09/15/2021   Procedure: INSERTION OF GASTROSTOMY TUBE;  Surgeon:  Signe Mitzie LABOR, MD;  Location: [facility];  Service: General;  Laterality: N/A;   NECK SURGERY  2004 & 2006   x's 2   POLYPECTOMY     RECTAL PROLAPSE REPAIR  01/2017   SMALL INTESTINE SURGERY     SPINE SURGERY     XI ROBOTIC ASSISTED PARAESOPHAGEAL HERNIA REPAIR N/A 09/15/2021   Procedure: XI ROBOTIC ASSISTED PARAESOPHAGEAL HERNIA REPAIR WITH GASTROPEXY;  Surgeon: Signe Mitzie LABOR, MD;  Location: [facility];  Service: General;  Laterality: N/A;   Patient Active Problem List   Diagnosis Date Noted   Cervical myofascial pain syndrome 10/11/2023   Family history of migraine headaches 07/06/2023   Leukocytosis 07/06/2023   High anion gap metabolic acidosis 07/06/2023   Intractable nausea and vomiting 07/05/2023   Cerumen impaction 04/09/2023   High risk medication use 06/12/2022   Insomnia 02/18/2022   S/P repair of paraesophageal hernia 09/15/2021   Prediabetes 04/03/2021   Decreased renal function 03/21/2019   Preventative health care 02/25/2018   Asthma 06/24/2017   Osteoarthritis 05/25/2017   Rectal prolapse 12/31/2016   Medicare annual wellness visit, subsequent 12/02/2016   Chronic pain 05/26/2016   Chronic migraine without aura without status migrainosus, not intractable 01/12/2016   Rectal discomfort 12/01/2012   Hemorrhoids 12/01/2012  History of colonic polyps 11/15/2009   Hyperlipidemia 11/14/2009   Anxiety and depression 11/14/2009   Essential hypertension 11/14/2009   Osteopenia 11/14/2009   Gastrojejunal ulcer 11/06/2009   HIATAL HERNIA 10/08/2009    PCP: Gretta Comer POUR, NP   REFERRING PROVIDER: Cornelio Bouchard, MD  REFERRING DIAG: M79.18 (ICD-10-CM) - Cervical myofascial pain syndrome   THERAPY DIAG:  Cervicalgia  Muscle weakness (generalized)  Abnormal posture  Rationale for Evaluation and Treatment: Rehabilitation  ONSET DATE: Chronic  SUBJECTIVE:   SUBJECTIVE STATEMENT: Pt presents to PT with reports of increased pain in L side of neck after  dental procedure yesterday. Has continued HEP compliance.   PERTINENT HISTORY: HTN, Osteoporosis   PAIN:  Are you having pain?  Yes: NPRS scale: 6/10 Worst: 10/10 Pain location: sub occipitals, bilateral upper trap  Pain description: sore, tight Aggravating factors: reading, driving, needle-point  Relieving factors: heat  PRECAUTIONS: None  RED FLAGS: None   WEIGHT BEARING RESTRICTIONS: No  FALLS:  Has patient fallen in last 6 months? No  LIVING ENVIRONMENT: Lives with: lives with their family Lives in: House/apartment  OCCUPATION: Retired  PLOF: Independent  PATIENT GOALS: decrease neck pain, be able to knit and read without pain  NEXT MD VISIT: 02/28/2024  OBJECTIVE:  Note: Objective measures were completed at Evaluation unless otherwise noted.  DIAGNOSTIC FINDINGS: See imaging   PATIENT SURVEYS:  NDI:  NECK DISABILITY INDEX  Date: 12/27/2023 Score  Pain intensity 3 = The pain is fairly severe at the moment  2. Personal care (washing, dressing, etc.) 0 = I can look after myself normally without causing extra pain  3. Lifting 1 =  I can lift heavy weights but it gives extra pain  4. Reading 3 = I can't read as much as I want because of moderate pain in my neck  5. Headaches 2 =  I have moderate headaches, which come infrequently  6. Concentration 3 = I have a lot of difficulty in concentrating when I want to  7. Work 0 =  I can do as much work as I want to  8. Driving 0 = I can drive my car without any neck pain  9. Sleeping 1 = My sleep is slightly disturbed (less than 1 hr sleepless)  10. Recreation 2 = I am able to engage in most, but not all of my usual recreation activities because of   pain in my neck  Total 15/50   Minimum Detectable Change (90% confidence): 5 points or 10% points  COGNITION: Overall cognitive status: Within functional limits for tasks assessed     SENSATION: WFL  POSTURE: rounded shoulders, forward head, and increased thoracic  kyphosis  PALPATION: TTP to bilateral suboccipitals, bilateral upper traps  CERVICAL ROM:   Active ROM A/PROM (deg) eval AROM 01/10/24  Flexion    Extension    Right lateral flexion    Left lateral flexion    Right rotation 40 50  Left rotation 40 40   (Blank rows = not tested)  UPPER EXTREMITY MMT:  MMT Right eval Left eval  Shoulder flexion    Shoulder extension    Shoulder abduction    Shoulder adduction    Shoulder extension    Shoulder internal rotation    Shoulder external rotation    Middle trapezius 2+ 2+  Lower trapezius 2+ 2+  Elbow flexion    Elbow extension    Wrist flexion    Wrist extension    Wrist ulnar deviation  Wrist radial deviation    Wrist pronation    Wrist supination    Grip strength     (Blank rows = not tested)    FUNCTIONAL TESTS:  Cervical Flexor Endurance Test: 5 sec  GAIT: Distance walked: 7ft Assistive device utilized: None Level of assistance: Complete Independence Comments: flexed trunk   TREATMENT: [facility] Adult PT Treatment:                                                DATE: 01/25/24 Manual Therapy: Suboccipital release Positional release upper trap STM and TPR to bilateral SCMs and scalenes TPR to suboccipitals  Skilled palpation of TPDN Trigger Point Dry Needling:  Initial Treatment: Pt instructed on Dry Needling rational, procedures, and possible side effects. Pt instructed to expect mild to moderate muscle soreness later in the day and/or into the next day.  Pt instructed to continue prescribed HEP.  Patient Verbal Consent Given: Yes Education Handout Provided: No   Muscles Treated: bilateral upper trapezius Electrical Stimulation Performed: No Treatment Response/Outcome: twitch response, decrease in muscle tone  PATIENT EDUCATION:  Education details: eval findings, NDI, HEP, POC Person educated: Patient Education method: Explanation, Demonstration, and Handouts Education comprehension: verbalized  understanding and returned demonstration  HOME EXERCISE PROGRAM: Access Code: [email or web address] URL: [email or web address] Date: 01/25/2024 Prepared by: [name] Kingdom  Exercises - Seated Assisted Cervical Rotation with Towel  - 1 x daily - 7 x weekly - 2 sets - 10 reps - 5 sec hold - Standing Shoulder Row with Anchored Resistance  - 1 x daily - 7 x weekly - 3 sets - 10 reps - red band hold - Supine Chin Tuck  - 1 x daily - 7 x weekly - 2 sets - 10 reps - 5 sec hold - Supine Shoulder Horizontal Abduction with Resistance  - 1 x daily - 7 x weekly - 1-2 sets - 15 reps - Seated Thoracic Lumbar Extension  - 1 x daily - 7 x weekly - 2 sets - 15 reps - Shoulder External Rotation and Scapular Retraction with Resistance  - 1 x daily - 7 x weekly - 2-3 sets - 10 reps - red band hold  ASSESSMENT:  CLINICAL IMPRESSION:  Pt tolerated treatment well today, noting decreased neck pain after TPDN and manual therapy. Overall she is doing well, she asked about potentially discharging next visit with HEP. Will assess goals and discharge readiness next session.   EVAL: Patient is a 73 y.o. F who was seen today for physical therapy evaluation and treatment for acute exacerbation of chronic neck pain. Physical findings are consistent with referring MD impression as pt demonstrates decrease in cervical ROM, postural muscle weakness, and TTP to bilateral upper traps and suboccipitals. NDI score shows moderate disability in performance of home ADLs and community activities. Pt would benefit from skilled PT services working to decrease neck pain by improving postural muscle strength and manual therapy interventions.    OBJECTIVE IMPAIRMENTS: decreased activity tolerance, decreased mobility, decreased ROM, decreased strength, postural dysfunction, and pain   ACTIVITY LIMITATIONS: carrying, lifting, sitting, standing, and reach over head  PARTICIPATION LIMITATIONS: meal prep, cleaning, driving, community activity,  and knitting reading  PERSONAL FACTORS: Time since onset of injury/illness/exacerbation and 1-2 comorbidities: HTN, Osteoporosis  are also affecting patient's functional outcome.   REHAB POTENTIAL: Good  CLINICAL DECISION MAKING: Stable/uncomplicated  EVALUATION COMPLEXITY: Moderate   GOALS: Goals reviewed with patient? No  SHORT TERM GOALS: Target date: 01/17/2024   Pt will be compliant and knowledgeable with initial HEP for improved comfort and carryover Baseline: initial HEP given  Goal status: INITIAL  2.  Pt will self report neck pain no greater than 7/10 for improved comfort and functional ability Baseline: 10/10 at worst 01/14/24: 6/10 Goal status: INITIAL   LONG TERM GOALS: Target date: 02/21/2024   Pt will decrease NDI disability score to no greater than 20% (10/50) as proxy for functional improvement Baseline: 30% disability (15/50) Goal status: INITIAL  2.  Pt will self report neck pain no greater than 3/10 for improved comfort and functional ability Baseline: 10/10 at worst Goal status: INITIAL   3.  Pt will improve bilateral lower/middle trap strength to at least 3+ for improved postural endurance and decreased pain Baseline: 2+ Goal status: INITIAL  4.  Pt will improve bilateral cervical rotation AROM to at least 60 degrees for improved functional ability with driving and other community activities Baseline: see chart Goal status: INITIAL  5.  Pt will improve deep cervical flexor endurance time to at least 15 seconds for improved endurance and decreased neck pain Baseline: 5 sec Goal status: INITIAL  6.  Pt will be able to knit and read without increase in neck pain for improved comfort and function of desired activities  Baseline: unable Goal status: INITIAL   PLAN:  PT FREQUENCY: 1-2x/week  PT DURATION: 8 weeks  PLANNED INTERVENTIONS: [id number]- PT Re-evaluation, 97110-Therapeutic exercises, 97530- Therapeutic activity, [id number]- Neuromuscular  re-education, 97535- Self Care, 02859- Manual therapy, [id number]- Gait training, [id number]- Electrical stimulation (unattended), [id number]- Electrical stimulation (manual), 20560 (1-2 muscles), 20561 (3+ muscles)- Dry Needling, Cryotherapy, and Moist heat  PLAN FOR NEXT SESSION: assess HEP response, DNF and periscapular strengthening, manual, pt likes passive stretching and manual   [name] JAYSON Kingdom PT  01/26/24 8:26 AM

## 2024-01-27 ENCOUNTER — Ambulatory Visit

## 2024-01-27 ENCOUNTER — Other Ambulatory Visit: Payer: Self-pay | Admitting: Primary Care

## 2024-01-27 DIAGNOSIS — M542 Cervicalgia: Secondary | ICD-10-CM

## 2024-01-27 DIAGNOSIS — M6281 Muscle weakness (generalized): Secondary | ICD-10-CM

## 2024-01-27 DIAGNOSIS — I10 Essential (primary) hypertension: Secondary | ICD-10-CM

## 2024-01-27 DIAGNOSIS — R293 Abnormal posture: Secondary | ICD-10-CM

## 2024-01-27 NOTE — Therapy (Signed)
 OUTPATIENT PHYSICAL THERAPY TREATMENT NOTE   Patient Name: [name] MRN: [id number] DOB:[birth year], 73 y.o., female Today's Date: 01/27/2024  END OF SESSION:  PT End of Session - 01/27/24 1359     Visit Number 8    Number of Visits 17    Date for Recertification  02/21/24    Authorization Type HTA    PT Start Time 1400    PT Stop Time 1440    PT Time Calculation (min) 40 min    Activity Tolerance Patient tolerated treatment well    Behavior During Therapy [facility] for tasks assessed/performed              Past Medical History:  Diagnosis Date   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   Anxiety    Asthma    as a child   CAP (community acquired pneumonia)    Cataract 2018   removed both eyes   COLONIC POLYPS, HX OF 11/15/2009   DEPRESSION 11/14/2009   GASTROJEJ ULCR UNS ACUT/CHRN W/O HEMOR PERF/OBST 11/06/2009   Generalized abdominal pain 12/02/2016   GERD (gastroesophageal reflux disease)    not on meds   Headache(784.0) 11/15/2009   Heart murmur    never bothered pt. always been told this   HEART MURMUR, HX OF 11/15/2009   HIATAL HERNIA 10/08/2009   Large   HIP FRACTURE, LEFT 11/14/2009   History of hiatal hernia    HYPERLIPIDEMIA 11/14/2009   HYPERTENSION 11/14/2009   Intractable nausea and vomiting 07/05/2023   OSTEOPOROSIS 11/14/2009   Prolonged Q-T interval on ECG 09/02/2021   WEIGHT GAIN 01/10/2010   Past Surgical History:  Procedure Laterality Date   APPENDECTOMY     AUGMENTATION MAMMAPLASTY Bilateral    BACK SURGERY  2003   Bleeding ulcers  10/2009   Broken Hip Left 06/2001   CATARACT EXTRACTION Bilateral 01/2017   COLONOSCOPY N/A 12/30/2016   Procedure: COLONOSCOPY WITH PROPOFOL ;  Surgeon: Debby Hila, MD;  Location: [facility];  Service: Endoscopy;  Laterality: N/A;   COLONOSCOPY  2011   inadequate prep, polyps   EYE SURGERY     FRACTURE SURGERY     GASTROSTOMY N/A 09/15/2021   Procedure: INSERTION OF GASTROSTOMY TUBE;  Surgeon:  Signe Mitzie LABOR, MD;  Location: [facility];  Service: General;  Laterality: N/A;   NECK SURGERY  2004 & 2006   x's 2   POLYPECTOMY     RECTAL PROLAPSE REPAIR  01/2017   SMALL INTESTINE SURGERY     SPINE SURGERY     XI ROBOTIC ASSISTED PARAESOPHAGEAL HERNIA REPAIR N/A 09/15/2021   Procedure: XI ROBOTIC ASSISTED PARAESOPHAGEAL HERNIA REPAIR WITH GASTROPEXY;  Surgeon: Signe Mitzie LABOR, MD;  Location: [facility];  Service: General;  Laterality: N/A;   Patient Active Problem List   Diagnosis Date Noted   Cervical myofascial pain syndrome 10/11/2023   Family history of migraine headaches 07/06/2023   Leukocytosis 07/06/2023   High anion gap metabolic acidosis 07/06/2023   Intractable nausea and vomiting 07/05/2023   Cerumen impaction 04/09/2023   High risk medication use 06/12/2022   Insomnia 02/18/2022   S/P repair of paraesophageal hernia 09/15/2021   Prediabetes 04/03/2021   Decreased renal function 03/21/2019   Preventative health care 02/25/2018   Asthma 06/24/2017   Osteoarthritis 05/25/2017   Rectal prolapse 12/31/2016   Medicare annual wellness visit, subsequent 12/02/2016   Chronic pain 05/26/2016   Chronic migraine without aura without status migrainosus, not intractable 01/12/2016   Rectal discomfort 12/01/2012   Hemorrhoids 12/01/2012  History of colonic polyps 11/15/2009   Hyperlipidemia 11/14/2009   Anxiety and depression 11/14/2009   Essential hypertension 11/14/2009   Osteopenia 11/14/2009   Gastrojejunal ulcer 11/06/2009   HIATAL HERNIA 10/08/2009    PCP: Gretta Comer POUR, NP   REFERRING PROVIDER: Cornelio Bouchard, MD  REFERRING DIAG: M79.18 (ICD-10-CM) - Cervical myofascial pain syndrome   THERAPY DIAG:  Cervicalgia  Muscle weakness (generalized)  Abnormal posture  Rationale for Evaluation and Treatment: Rehabilitation  ONSET DATE: Chronic  SUBJECTIVE:   SUBJECTIVE STATEMENT: Pt presents to PT with continued neck pain and tightness. Has been  compliant with HEP and feels ready for discharge.   PERTINENT HISTORY: HTN, Osteoporosis   PAIN:  Are you having pain?  Yes: NPRS scale: 6/10 Worst: 10/10 Pain location: sub occipitals, bilateral upper trap  Pain description: sore, tight Aggravating factors: reading, driving, needle-point  Relieving factors: heat  PRECAUTIONS: None  RED FLAGS: None   WEIGHT BEARING RESTRICTIONS: No  FALLS:  Has patient fallen in last 6 months? No  LIVING ENVIRONMENT: Lives with: lives with their family Lives in: House/apartment  OCCUPATION: Retired  PLOF: Independent  PATIENT GOALS: decrease neck pain, be able to knit and read without pain  NEXT MD VISIT: 02/28/2024  OBJECTIVE:  Note: Objective measures were completed at Evaluation unless otherwise noted.  DIAGNOSTIC FINDINGS: See imaging   PATIENT SURVEYS:  NDI:  NECK DISABILITY INDEX  Date: 12/27/2023 Score  Pain intensity 3 = The pain is fairly severe at the moment  2. Personal care (washing, dressing, etc.) 0 = I can look after myself normally without causing extra pain  3. Lifting 1 =  I can lift heavy weights but it gives extra pain  4. Reading 3 = I can't read as much as I want because of moderate pain in my neck  5. Headaches 2 =  I have moderate headaches, which come infrequently  6. Concentration 3 = I have a lot of difficulty in concentrating when I want to  7. Work 0 =  I can do as much work as I want to  8. Driving 0 = I can drive my car without any neck pain  9. Sleeping 1 = My sleep is slightly disturbed (less than 1 hr sleepless)  10. Recreation 2 = I am able to engage in most, but not all of my usual recreation activities because of   pain in my neck  Total 15/50   Minimum Detectable Change (90% confidence): 5 points or 10% points  COGNITION: Overall cognitive status: Within functional limits for tasks assessed     SENSATION: WFL  POSTURE: rounded shoulders, forward head, and increased thoracic  kyphosis  PALPATION: TTP to bilateral suboccipitals, bilateral upper traps  CERVICAL ROM:   Active ROM A/PROM (deg) eval AROM 01/10/24 AROM 01/27/24  Flexion     Extension     Right lateral flexion     Left lateral flexion     Right rotation 40 50 61  Left rotation 40 40 57   (Blank rows = not tested)  UPPER EXTREMITY MMT:  MMT Right eval Left eval  Shoulder flexion    Shoulder extension    Shoulder abduction    Shoulder adduction    Shoulder extension    Shoulder internal rotation    Shoulder external rotation    Middle trapezius 2+ 2+  Lower trapezius 2+ 2+  Elbow flexion    Elbow extension    Wrist flexion    Wrist extension  Wrist ulnar deviation    Wrist radial deviation    Wrist pronation    Wrist supination    Grip strength     (Blank rows = not tested)    FUNCTIONAL TESTS:  Cervical Flexor Endurance Test: 5 sec  GAIT: Distance walked: 24ft Assistive device utilized: None Level of assistance: Complete Independence Comments: flexed trunk   TREATMENT: [facility] Adult PT Treatment:                                                DATE: 01/27/2024 Therapeutic Activity: Assessments of tests/measures, goals, and outcomes for discharge Manual Therapy: Suboccipital release Positional release upper trap STM and TPR to bilateral SCMs and scalenes TPR to suboccipitals  Skilled palpation of TPDN Trigger Point Dry Needling:   Initial Treatment: Pt instructed on Dry Needling rational, procedures, and possible side effects. Pt instructed to expect mild to moderate muscle soreness later in the day and/or into the next day.  Pt instructed to continue prescribed HEP.   Patient Verbal Consent Given: Yes Education Handout Provided: No   Muscles Treated: bilateral upper trapezius Electrical Stimulation Performed: No Treatment Response/Outcome: twitch response, decrease in muscle tone  PATIENT EDUCATION:  Education details: eval findings, NDI, HEP, POC Person  educated: Patient Education method: Explanation, Demonstration, and Handouts Education comprehension: verbalized understanding and returned demonstration  HOME EXERCISE PROGRAM: Access Code: [email or web address] URL: [email or web address] Date: 01/25/2024 Prepared by: [name] Kingdom  Exercises - Seated Assisted Cervical Rotation with Towel  - 1 x daily - 7 x weekly - 2 sets - 10 reps - 5 sec hold - Standing Shoulder Row with Anchored Resistance  - 1 x daily - 7 x weekly - 3 sets - 10 reps - red band hold - Supine Chin Tuck  - 1 x daily - 7 x weekly - 2 sets - 10 reps - 5 sec hold - Supine Shoulder Horizontal Abduction with Resistance  - 1 x daily - 7 x weekly - 1-2 sets - 15 reps - Seated Thoracic Lumbar Extension  - 1 x daily - 7 x weekly - 2 sets - 15 reps - Shoulder External Rotation and Scapular Retraction with Resistance  - 1 x daily - 7 x weekly - 2-3 sets - 10 reps - red band hold  ASSESSMENT:  CLINICAL IMPRESSION:  Pt responded well to TPDN and manual and demonstrated knowledge of HEP with no adverse effect. Over the course of PT she has made some progress in reducing neck pain. She should continue to improve with HEP compliance and is ready to discharge at this time.  EVAL: Patient is a 73 y.o. F who was seen today for physical therapy evaluation and treatment for acute exacerbation of chronic neck pain. Physical findings are consistent with referring MD impression as pt demonstrates decrease in cervical ROM, postural muscle weakness, and TTP to bilateral upper traps and suboccipitals. NDI score shows moderate disability in performance of home ADLs and community activities. Pt would benefit from skilled PT services working to decrease neck pain by improving postural muscle strength and manual therapy interventions.    OBJECTIVE IMPAIRMENTS: decreased activity tolerance, decreased mobility, decreased ROM, decreased strength, postural dysfunction, and pain   ACTIVITY LIMITATIONS:  carrying, lifting, sitting, standing, and reach over head  PARTICIPATION LIMITATIONS: meal prep, cleaning, driving, community activity, and knitting reading  PERSONAL FACTORS: Time  since onset of injury/illness/exacerbation and 1-2 comorbidities: HTN, Osteoporosis  are also affecting patient's functional outcome.   REHAB POTENTIAL: Good  CLINICAL DECISION MAKING: Stable/uncomplicated  EVALUATION COMPLEXITY: Moderate   GOALS: Goals reviewed with patient? No  SHORT TERM GOALS: Target date: 01/17/2024   Pt will be compliant and knowledgeable with initial HEP for improved comfort and carryover Baseline: initial HEP given  Goal status: MET  2.  Pt will self report neck pain no greater than 7/10 for improved comfort and functional ability Baseline: 10/10 at worst 01/14/24: 6/10 Goal status: MET   LONG TERM GOALS: Target date: 02/21/2024   Pt will decrease NDI disability score to no greater than 20% (10/50) as proxy for functional improvement Baseline: 30% disability (15/50) 01/27/2024: 28% disability (14/50) Goal status: NOT MET  2.  Pt will self report neck pain no greater than 3/10 for improved comfort and functional ability Baseline: 10/10 at worst 01/27/24: 4/10 Goal status: PARTIALLY MET   3.  Pt will improve bilateral lower/middle trap strength to at least 3+ for improved postural endurance and decreased pain Baseline: 2+ 01/27/2024: NOT MET Goal status: INITIAL  4.  Pt will improve bilateral cervical rotation AROM to at least 60 degrees for improved functional ability with driving and other community activities Baseline: see chart Goal status: MOSTLY MET  5.  Pt will improve deep cervical flexor endurance time to at least 15 seconds for improved endurance and decreased neck pain Baseline: 5 sec 01/27/2024: 10 sec Goal status: PARTIALLY MET  6.  Pt will be able to knit and read without increase in neck pain for improved comfort and function of desired activities   Baseline: unable Goal status: MOSTLY MET   PLAN:  PT FREQUENCY: 1-2x/week  PT DURATION: 8 weeks  PLANNED INTERVENTIONS: 97164- PT Re-evaluation, 97110-Therapeutic exercises, 97530- Therapeutic activity, [id number]- Neuromuscular re-education, 97535- Self Care, 02859- Manual therapy, [id number]- Gait training, [phone]- Electrical stimulation (unattended), [id number]- Electrical stimulation (manual), 20560 (1-2 muscles), 20561 (3+ muscles)- Dry Needling, Cryotherapy, and Moist heat  PLAN FOR NEXT SESSION: assess HEP response, DNF and periscapular strengthening, manual, pt likes passive stretching and manual   [name] JAYSON Kingdom PT  01/27/24 3:02 PM

## 2024-01-28 NOTE — Progress Notes (Signed)
 [name] [address] [place] [address] Phone: [phone]   Assessment and Plan:     1. Neck pain (Primary) 2. DDD (degenerative disc disease), cervical 3. History of fusion of cervical spine 4. Somatic dysfunction of cervical region 5. Somatic dysfunction of thoracic region 6. Somatic dysfunction of rib region -Chronic with exacerbation, initial visit - Chronic neck pain, restricted ROM, cervicogenic headaches likely caused from underlying degenerative changes, history of cervical spine fusion C3-C7, resulting musculoskeletal dysfunction - Continue HEP and physical therapy - Continue follow-up with [facility], Dr. Lovorn for injection therapy - Continue follow-up with neurology - Use Tylenol  500 to 1000 mg tablets 2-3 times a day for day-to-day pain relief - Continue chronic prescription medications Lyrica , Zanaflex , trazodone  for muscle pain and spasms - Patient elected for initial OMT today.  Tolerated well per note below. - Decision today to treat with OMT was based on Physical Exam  After verbal consent patient was treated with HVLA (high velocity low amplitude), ME (muscle energy), FPR (flex positional release), ST (soft tissue), PC/PD (Pelvic Compression/ Pelvic Decompression) techniques in cervical, rib, thoracic,   areas. Patient tolerated the procedure well with improvement in symptoms.  Patient educated on potential side effects of soreness and recommended to rest, hydrate, and use Tylenol  as needed for pain control.   C-spine MRI 5 29-25 IMPRESSION: MRI scan of cervical spine without contrast showing stable postoperative changes of anterior cervical fusion from C3-C7.  There are prominent spondylitic changes at C3-4 with central disc bulge and ligamentum flavum hypertrophy resulting in mild posterior canal narrowing and mild right greater than left foraminal narrowing.  There are also mild spondylitic changes at C2-3 and  C7-T1 but without significant compression.  Overall these changes appear significantly progressed at C3-4 compared to previous MRI from 11/02/2006     Pertinent previous records reviewed include C-spine MRI 09/30/2023, [facility] notes 01/19/2024  Follow Up: 3 weeks for reevaluation.  If patient had relief from OMT, could consider continuing OMT.  If no significant relief or worsening of symptoms, would recommend pursuing other treatment options   Subjective:   I, [name], am serving as a Neurosurgeon for Doctor Morene Mace  Chief Complaint: neck pain   HPI:   01/31/2024 Patient is a 73 year old female with neck pain. Patient states pain for a long time pain is increasing . Hx of 2 neck surgeries and back. Her neck is weak. Has been going to PT. Here to discuss manipulation. No numbness or tingling. A little radiating pain to the shoulders mostly upper trap.    Relevant Historical Information: Hypertension  Additional pertinent review of systems negative.   Current Outpatient Medications:    CALCIUM  PO, Take 1 tablet by mouth daily. , Disp: , Rfl:    lisinopril  (ZESTRIL ) 20 MG tablet, TAKE 1 TABLET BY MOUTH EVERY DAY FOR BLOOD PRESSURE, Disp: 90 tablet, Rfl: 0   Multiple Vitamins-Minerals (MULTIVITAMIN PO), Take 1 tablet by mouth daily. , Disp: , Rfl:    pantoprazole  (PROTONIX ) 40 MG tablet, Take 1 tablet (40 mg total) by mouth 2 (two) times daily. for heartburn., Disp: 180 tablet, Rfl: 1   pregabalin  (LYRICA ) 50 MG capsule, Start with twice daily and if no side effects in 2 weeks can increase to 3x a day for chronic pain, Disp: 90 capsule, Rfl: 6   rosuvastatin  (CRESTOR ) 5 MG tablet, TAKE 1 TABLET BY MOUTH EVERY DAY FOR CHOLESTEROL, Disp: 90 tablet,  Rfl: 2   sertraline  (ZOLOFT ) 100 MG tablet, Take 2 tablets (200 mg total) by mouth daily., Disp: 180 tablet, Rfl: 1   tiZANidine  (ZANAFLEX ) 4 MG tablet, Take 1 tablet (4 mg total) by mouth every 6 (six) hours as needed for muscle spasms.,  Disp: 90 tablet, Rfl: 5   traZODone  (DESYREL ) 100 MG tablet, Take 1 tablet (100 mg total) by mouth at bedtime., Disp: 90 tablet, Rfl: 1   Objective:     Vitals:   01/31/24 1353  BP: 122/68  Pulse: 86  SpO2: 98%  Weight: 106 lb (48.1 kg)  Height: 5' (1.524 m)      Body mass index is 20.7 kg/m.    Physical Exam:    Neck Exam: Cervical Spine- Posture normal Skin- normal, intact  Neuro:  Strength-  Right Left   Deltoid (C5) 5/5 5/5  Bicep/Brachioradialis (C5/6) 5/5  5/5  Wrist Extension (C6) 5/5 5/5  Tricep (C7) 5/5 5/5  Wrist Flexion (C7) 5/5 5/5  Grip (C8) 5/5 5/5  Finger Abduction (T1) 5/5 5/5   Sensation: intact to light touch in upper extremities bilaterally  Spurling's:  negative bilaterally Neck ROM: Reduced active ROM in all directions TTP: cervical paraspinal, thoracic paraspinal, NTTP: cervical spinous processes,  trapezius     OMT Physical Exam:    Cervical: TTP paraspinal, reduced motion in all directions with history of C3-C7 fusion Rib: Bilateral elevated first rib with TTP Thoracic: TTP bilateral paraspinal.  Increased kyphosis.  T3-8 RLSR  Electronically signed by:  Odis [name] 2:20 PM 01/31/24

## 2024-01-31 ENCOUNTER — Ambulatory Visit: Admitting: Sports Medicine

## 2024-01-31 VITALS — BP 122/68 | HR 86 | Ht 60.0 in | Wt 106.0 lb

## 2024-01-31 DIAGNOSIS — M542 Cervicalgia: Secondary | ICD-10-CM

## 2024-01-31 DIAGNOSIS — M9902 Segmental and somatic dysfunction of thoracic region: Secondary | ICD-10-CM

## 2024-01-31 DIAGNOSIS — M9901 Segmental and somatic dysfunction of cervical region: Secondary | ICD-10-CM | POA: Diagnosis not present

## 2024-01-31 DIAGNOSIS — M9908 Segmental and somatic dysfunction of rib cage: Secondary | ICD-10-CM

## 2024-01-31 DIAGNOSIS — Z981 Arthrodesis status: Secondary | ICD-10-CM | POA: Diagnosis not present

## 2024-01-31 DIAGNOSIS — M5030 Other cervical disc degeneration, unspecified cervical region: Secondary | ICD-10-CM

## 2024-01-31 NOTE — Patient Instructions (Signed)
 3 week follow up [facility]

## 2024-02-19 ENCOUNTER — Other Ambulatory Visit: Payer: Self-pay | Admitting: Primary Care

## 2024-02-19 DIAGNOSIS — E785 Hyperlipidemia, unspecified: Secondary | ICD-10-CM

## 2024-02-28 ENCOUNTER — Encounter: Payer: Self-pay | Admitting: Physical Medicine and Rehabilitation

## 2024-02-28 ENCOUNTER — Encounter: Attending: Physical Medicine and Rehabilitation | Admitting: Physical Medicine and Rehabilitation

## 2024-02-28 VITALS — BP 124/78 | HR 89 | Ht 60.0 in | Wt 104.0 lb

## 2024-02-28 DIAGNOSIS — G8921 Chronic pain due to trauma: Secondary | ICD-10-CM | POA: Insufficient documentation

## 2024-02-28 DIAGNOSIS — M7918 Myalgia, other site: Secondary | ICD-10-CM | POA: Insufficient documentation

## 2024-02-28 MED ORDER — LIDOCAINE HCL 1 % IJ SOLN
6.0000 mL | Freq: Once | INTRAMUSCULAR | Status: AC
  Administered 2024-02-28: 6 mL

## 2024-02-28 MED ORDER — NALTREXONE HCL 50 MG PO TABS: 25.0000 mg | ORAL_TABLET | Freq: Every day | ORAL | Status: DC

## 2024-02-28 NOTE — Patient Instructions (Signed)
 Plan:   For pain-  Will try Low dose Naltrexone- 3 mg at bedtime x 7 days- then 6 mg at bedtime- for nerve/muscle pain-  [address]- [phone].   2. Patient here for trigger point injections for  Consent done and on chart.  Cleaned areas with alcohol and injected using a 27 gauge 1.5 inch needle  Injected 4.5cc- wasted 1.5cc Using 1% Lidocaine  with no EPI  Upper traps B/L x2 Levators B/L  Posterior scalenes Middle scalenes- B/L x2 Splenius Capitus- B/l x2 Pectoralis Major- B/L  Rhomboids B/L x2 Infraspinatus Teres Major/minor Thoracic paraspinals Lumbar paraspinals Other injections-     There was no bleeding or complications.  Patient was advised to drink a lot of water on day after injections to flush system Will have increased soreness for 12-48 hours after injections.  Can use Lidocaine  patches the day AFTER injections Can use theracane on day of injections in places didn't inject Can use heating pad 4-6 hours AFTER injections   3. Con't Tizanidine  2-4 mg at bedtime- for muscle pain- because can cause light headedness.  Can take with a little salt to keep BP up.   4. F/U in 6 weeks- q  weeks

## 2024-02-28 NOTE — Progress Notes (Signed)
 Pt is a 73 yr old female with hx of HTN, HLD;  insomnia and Depression;  Migraines- hx of bleeding ulcers- and subacute neck pain on chronic neck pain- with myofascial pain.     Sent for f/u  of neck pain           Still hurts a lot- doing exercises, 7 days/week  Did PT for 1 month Doing acupuncture- - appt in 1 week- 2 weeks prior as well.    Has to be real careful with Zanaflex - makes her light headed- needed to get to bed with A b/c of lightheaded.    Just still really hurts- in spite of injections helping.   Cannot determine how long injections lasted this time.   Flexeril  not real effective for her.     Plan:   For pain-  Will try Low dose Naltrexone- 3 mg at bedtime x 7 days- then 6 mg at bedtime- for nerve/muscle pain-  [address]- [phone].   2. Patient here for trigger point injections for  Consent done and on chart.  Cleaned areas with alcohol and injected using a 27 gauge 1.5 inch needle  Injected 4.5cc- wasted 1.5cc Using 1% Lidocaine  with no EPI  Upper traps B/L x2 Levators B/L  Posterior scalenes Middle scalenes- B/L x2 Splenius Capitus- B/l x2 Pectoralis Major- B/L  Rhomboids B/L x2 Infraspinatus Teres Major/minor Thoracic paraspinals Lumbar paraspinals Other injections-     There was no bleeding or complications.  Patient was advised to drink a lot of water on day after injections to flush system Will have increased soreness for 12-48 hours after injections.  Can use Lidocaine  patches the day AFTER injections Can use theracane on day of injections in places didn't inject Can use heating pad 4-6 hours AFTER injections   3. Con't Tizanidine  2-4 mg at bedtime- for muscle pain- because can cause light headedness.  Can take with a little salt to keep BP up.   4. F/U in 6 weeks- q  weeks   I spent a total of  27 minutes on total care today- >50% coordination of care- due to 6 minutes on injections- rest of appt d/w pt options  for pain regimen- and decided on [facility] for natrexone- called in.

## 2024-04-10 ENCOUNTER — Encounter: Attending: Physical Medicine and Rehabilitation | Admitting: Physical Medicine and Rehabilitation

## 2024-04-10 ENCOUNTER — Encounter: Payer: Self-pay | Admitting: Physical Medicine and Rehabilitation

## 2024-04-10 VITALS — BP 132/73 | HR 72 | Ht 60.0 in | Wt 104.2 lb

## 2024-04-10 DIAGNOSIS — M7918 Myalgia, other site: Secondary | ICD-10-CM | POA: Diagnosis not present

## 2024-04-10 DIAGNOSIS — G243 Spasmodic torticollis: Secondary | ICD-10-CM | POA: Diagnosis not present

## 2024-04-10 DIAGNOSIS — M5412 Radiculopathy, cervical region: Secondary | ICD-10-CM | POA: Diagnosis not present

## 2024-04-10 DIAGNOSIS — G8929 Other chronic pain: Secondary | ICD-10-CM | POA: Insufficient documentation

## 2024-04-10 MED ORDER — LIDOCAINE HCL 1 % IJ SOLN
6.0000 mL | Freq: Once | INTRAMUSCULAR | Status: AC
  Administered 2024-04-10: 6 mL

## 2024-04-10 NOTE — Progress Notes (Signed)
 Pt is a 73 yr old female with hx of HTN, HLD;  insomnia and Depression;  Migraines- hx of bleeding ulcers- and subacute neck pain on chronic neck pain- with myofascial pain.     Sent for f/u  of neck pain              Zanaflex  didn't help And Low dose naltrexone  didn't help.   Pain appears to have changed no mobility in neck- at all anymore.   Seeing acupuncturist- q2 weeks- helps a little bit.   Does PT HEP 7 days/week.  Has done everything suggested- it's her job and getting frustrated.    Had 2 fusions in neck- C4-7 is what is showing in MRI from 6 months ago.   Also hx of lumbar fusion- but doesn't know level of surgery- Dr Oneil Carwin did it- who's since retired.   MRI Was ordered by Dr Ines- [facility]rology in 09/2023.  Was going there for Migraines,  Had migraines all her life- can handle those.    Have had ESI for Low back- 3x- first was great, 2nd was OK and 3rd wasn't worth the time.    Using massage gun and theracane- great to use- pressure quick- for a few minutes- at least 1x/day.  Massage gun- husband does mostly for her. But only lasts a few minutes.    TrP injections- feels better that first week-  Acupuncture- was going q2 weeks, and they are out of country for 4 weeks- expensive q 2 weeks though.    Not interested in surgery.  No pain meds- due to   Had Botox  for migraines in past- at least 9 months-    Neck has always been a problem for her.  Big needle pointer- has bought stand, to do in front of her- that seems a lot- allowing her to do needle point.  Tries to bring phone up instead of turning head down.     Exam: Awake, alert, appropriate, dressed up for Christmas, NAD Tight scalenes L>R Chin to left and Top of head to R; also somewhat anterior- chin compared to neutral.   Plan: 1. Never had ESI in neck- only on lower back- will try to do this  2.    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  As well as trp   INjections  3.  Will do trigger point injections today- and will see if that can keep her until gets ESI--  4. Patient here for trigger point injections for  Consent done and on chart.  Cleaned areas with alcohol and injected using a 27 gauge 1.5 inch needle  Injected 6cc- none wasted Using 1% Lidocaine  with no EPI  Upper traps B/L x3 on R and 2 on L Levators B/L  Posterior scalenes B/L  Middle scalenes B/L x2 Splenius Capitus B/L Pectoralis Major B/L shallow Rhomboids B/L x2 Infraspinatus Teres Major/minor Thoracic paraspinals Lumbar paraspinals Other injections- SCM's- B/L  very careful to check to make sur enot in vessels    There was no bleeding or complications.  Patient was advised to drink a lot of water on day after injections to flush system Will have increased soreness for 12-48 hours after injections.  Can use Lidocaine  patches the day AFTER injections Can use theracane on day of injections in places didn't inject Can use heating pad 4-6 hours AFTER injections  5. No pain meds  6. F/U  on trP inj and ck pain -hx of hypermobility  I spent a total of   39 minutes on total care today- >50% coordination of care- due to  injections 8 minutes- rest discussing options for pain- decided on botox  and ESI

## 2024-04-10 NOTE — Patient Instructions (Signed)
 Plan: 1. Never had ESI in neck- only on lower back- will try to do this  2.    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  As well as trp  INjections  3.  Will do trigger point injections today- and will see if that can keep her until gets ESI--  4. Patient here for trigger point injections for  Consent done and on chart.  Cleaned areas with alcohol and injected using a 27 gauge 1.5 inch needle  Injected 6cc- none wasted Using 1% Lidocaine  with no EPI  Upper traps B/L x3 on R and 2 on L Levators B/L  Posterior scalenes B/L  Middle scalenes B/L x2 Splenius Capitus B/L Pectoralis Major B/L shallow Rhomboids B/L x2 Infraspinatus Teres Major/minor Thoracic paraspinals Lumbar paraspinals Other injections- SCM's- B/L  very careful to check to make sur enot in vessels    There was no bleeding or complications.  Patient was advised to drink a lot of water on day after injections to flush system Will have increased soreness for 12-48 hours after injections.  Can use Lidocaine  patches the day AFTER injections Can use theracane on day of injections in places didn't inject Can use heating pad 4-6 hours AFTER injections  5. No pain meds  6. F/U  on trP inj and ck pain -hx of hypermobility

## 2024-04-13 ENCOUNTER — Encounter: Admitting: Primary Care

## 2024-04-27 ENCOUNTER — Other Ambulatory Visit: Payer: Self-pay | Admitting: Primary Care

## 2024-04-27 DIAGNOSIS — I10 Essential (primary) hypertension: Secondary | ICD-10-CM

## 2024-04-30 ENCOUNTER — Encounter: Payer: Self-pay | Admitting: Physical Medicine and Rehabilitation

## 2024-04-30 DIAGNOSIS — M542 Cervicalgia: Secondary | ICD-10-CM

## 2024-04-30 DIAGNOSIS — M4302 Spondylolysis, cervical region: Secondary | ICD-10-CM

## 2024-05-09 ENCOUNTER — Ambulatory Visit: Admitting: Primary Care

## 2024-05-09 ENCOUNTER — Encounter: Payer: Self-pay | Admitting: Primary Care

## 2024-05-09 ENCOUNTER — Ambulatory Visit: Payer: Self-pay | Admitting: Primary Care

## 2024-05-09 VITALS — BP 110/70 | HR 80 | Temp 97.9°F | Ht 59.5 in | Wt 100.2 lb

## 2024-05-09 DIAGNOSIS — G4700 Insomnia, unspecified: Secondary | ICD-10-CM | POA: Diagnosis not present

## 2024-05-09 DIAGNOSIS — R7303 Prediabetes: Secondary | ICD-10-CM | POA: Diagnosis not present

## 2024-05-09 DIAGNOSIS — F419 Anxiety disorder, unspecified: Secondary | ICD-10-CM

## 2024-05-09 DIAGNOSIS — Z0000 Encounter for general adult medical examination without abnormal findings: Secondary | ICD-10-CM

## 2024-05-09 DIAGNOSIS — G43709 Chronic migraine without aura, not intractable, without status migrainosus: Secondary | ICD-10-CM | POA: Diagnosis not present

## 2024-05-09 DIAGNOSIS — G8929 Other chronic pain: Secondary | ICD-10-CM | POA: Diagnosis not present

## 2024-05-09 DIAGNOSIS — I10 Essential (primary) hypertension: Secondary | ICD-10-CM | POA: Diagnosis not present

## 2024-05-09 DIAGNOSIS — M5412 Radiculopathy, cervical region: Secondary | ICD-10-CM

## 2024-05-09 DIAGNOSIS — F32A Depression, unspecified: Secondary | ICD-10-CM | POA: Diagnosis not present

## 2024-05-09 DIAGNOSIS — Z1211 Encounter for screening for malignant neoplasm of colon: Secondary | ICD-10-CM

## 2024-05-09 DIAGNOSIS — E2839 Other primary ovarian failure: Secondary | ICD-10-CM

## 2024-05-09 DIAGNOSIS — E785 Hyperlipidemia, unspecified: Secondary | ICD-10-CM | POA: Diagnosis not present

## 2024-05-09 DIAGNOSIS — J4520 Mild intermittent asthma, uncomplicated: Secondary | ICD-10-CM | POA: Diagnosis not present

## 2024-05-09 LAB — LIPID PANEL
Cholesterol: 144 mg/dL (ref 28–200)
HDL: 61 mg/dL
LDL Cholesterol: 63 mg/dL (ref 10–99)
NonHDL: 83.03
Total CHOL/HDL Ratio: 2
Triglycerides: 102 mg/dL (ref 10.0–149.0)
VLDL: 20.4 mg/dL (ref 0.0–40.0)

## 2024-05-09 LAB — COMPREHENSIVE METABOLIC PANEL WITH GFR
ALT: 16 U/L (ref 3–35)
AST: 25 U/L (ref 5–37)
Albumin: 4.3 g/dL (ref 3.5–5.2)
Alkaline Phosphatase: 60 U/L (ref 39–117)
BUN: 19 mg/dL (ref 6–23)
CO2: 30 meq/L (ref 19–32)
Calcium: 9.5 mg/dL (ref 8.4–10.5)
Chloride: 103 meq/L (ref 96–112)
Creatinine, Ser: 0.79 mg/dL (ref 0.40–1.20)
GFR: 73.59 mL/min
Glucose, Bld: 115 mg/dL — ABNORMAL HIGH (ref 70–99)
Potassium: 4 meq/L (ref 3.5–5.1)
Sodium: 139 meq/L (ref 135–145)
Total Bilirubin: 0.3 mg/dL (ref 0.2–1.2)
Total Protein: 6.6 g/dL (ref 6.0–8.3)

## 2024-05-09 LAB — HEMOGLOBIN A1C: Hgb A1c MFr Bld: 5.9 % (ref 4.6–6.5)

## 2024-05-09 MED ORDER — PREGABALIN 50 MG PO CAPS: 50.0000 mg | ORAL_CAPSULE | Freq: Every day | ORAL | 0 refills | Status: AC

## 2024-05-09 NOTE — Assessment & Plan Note (Addendum)
 Controlled.  Continue lisinopril 20 mg daily.  CMP pending.

## 2024-05-09 NOTE — Assessment & Plan Note (Signed)
 Uncontrolled which has suspected to be secondary to her chronic back pain.  Continue to follow with physical medicine. Start Lyrica  50 mg daily.  She will update

## 2024-05-09 NOTE — Assessment & Plan Note (Signed)
 Controlled.  Continue Zoloft  200 mg daily and trazodone  100 mg at bedtime Following with psychiatry

## 2024-05-09 NOTE — Assessment & Plan Note (Signed)
"   No concerns today  "

## 2024-05-09 NOTE — Assessment & Plan Note (Signed)
 Repeat A1c pending

## 2024-05-09 NOTE — Patient Instructions (Addendum)
 Complete your bone density scan at [facility] in [place]. Call to schedule an appointment: [phone]  Stop by the lab prior to leaving today. I will notify you of your results once received.   Start Lyrica  50 mg once daily for neck pain.  Please keep me updated.  You will receive a phone call regarding the colonoscopy  It was a pleasure to see you today!

## 2024-05-09 NOTE — Progress Notes (Signed)
 "  Subjective:    Patient ID: [name], female    DOB: 16-Jan-1951, 74 y.o.   MRN: [id number]  [name] is a very pleasant 74 y.o. female who presents today for complete physical and follow up of chronic conditions.  Her main concern today is her chronic debilitating neck pain.  Following with physical medicine and has been undergoing treatment without lasting effects.  She is scheduled for an epidural soon.  Her neck pain is so debilitating as it is constant and interrupt her day-to-day.  She is needing something to help manage her pain as nothing thus far has helped.  Immunizations: -Tetanus: Completed in 2024 -Influenza: Completed this season -Shingles: Completed Shingrix series -Pneumonia: Completed Prevnar 20 in 2022  Diet: Fair diet.  Exercise: Regular exercise.  Eye exam: Completes annually  Dental exam: Completes semi-annually    Mammogram: Completed in July 2025 Bone Density Scan: Completed in July 2023  Colonoscopy: Completed in 2022, due 2025  BP Readings from Last 3 Encounters:  05/09/24 110/70  04/10/24 132/73  02/28/24 124/78    Wt Readings from Last 3 Encounters:  05/09/24 100 lb 4 oz (45.5 kg)  04/10/24 104 lb 3.2 oz (47.3 kg)  02/28/24 104 lb (47.2 kg)      Review of Systems  Constitutional:  Negative for unexpected weight change.  HENT:  Negative for rhinorrhea.   Respiratory:  Negative for cough and shortness of breath.   Cardiovascular:  Negative for chest pain.  Gastrointestinal:  Negative for constipation and diarrhea.  Genitourinary:  Negative for difficulty urinating.  Musculoskeletal:  Positive for arthralgias and neck pain.  Skin:  Negative for rash.  Allergic/Immunologic: Negative for environmental allergies.  Neurological:  Positive for headaches. Negative for dizziness.  Psychiatric/Behavioral:  The patient is not nervous/anxious.          Past Medical History:  Diagnosis Date   ANEMIA-IRON DEFICIENCY  11/14/2009   Anxiety    Asthma    as a child   CAP (community acquired pneumonia)    Cataract 2018   removed both eyes   COLONIC POLYPS, HX OF 11/15/2009   DEPRESSION 11/14/2009   GASTROJEJ ULCR UNS ACUT/CHRN W/O HEMOR PERF/OBST 11/06/2009   Generalized abdominal pain 12/02/2016   GERD (gastroesophageal reflux disease)    not on meds   Headache(784.0) 11/15/2009   Heart murmur    never bothered pt. always been told this   HEART MURMUR, HX OF 11/15/2009   HIATAL HERNIA 10/08/2009   Large   HIP FRACTURE, LEFT 11/14/2009   History of hiatal hernia    HYPERLIPIDEMIA 11/14/2009   HYPERTENSION 11/14/2009   Intractable nausea and vomiting 07/05/2023   OSTEOPOROSIS 11/14/2009   Prolonged Q-T interval on ECG 09/02/2021   WEIGHT GAIN 01/10/2010    Social History   Socioeconomic History   Marital status: Married    Spouse name: [name]   Number of children: 3   Years of education: BA   Highest education level: Not on file  Occupational History   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 Spa  Tobacco Use   Smoking status: Former    Current packs/day: 0.00    Types: Cigarettes    Start date: 05/05/1967    Quit date: 05/04/1982    Years since quitting: 42.0   Smokeless tobacco: Never   Tobacco comments:    Married, 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[place], 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 (stress)  Vaping Use  Vaping status: Never Used  Substance and Sexual Activity   Alcohol use: Not Currently    Alcohol/week: 1.0 standard drink of alcohol    Types: 1 Glasses of wine per week   Drug use: No   Sexual activity: Not Currently    Birth control/protection: None  Other Topics Concern   Not on file  Social History Narrative   Lives with husband   Caffeine use: 1 cup or less    Right handed   Social Drivers of Health   Tobacco Use: Medium Risk (05/09/2024)   Patient History    Smoking Tobacco Use: Former    Smokeless Tobacco Use: Never    Passive  Exposure: Not on Actuary Strain: Low Risk (12/22/2023)   Overall Financial Resource Strain (CARDIA)    Difficulty of Paying Living Expenses: Not hard at all  Food Insecurity: No Food Insecurity (12/22/2023)   Epic    Worried About Radiation Protection Practitioner of Food in the Last Year: Never true    Ran Out of Food in the Last Year: Never true  Transportation Needs: No Transportation Needs (12/22/2023)   Epic    Lack of Transportation (Medical): No    Lack of Transportation (Non-Medical): No  Physical Activity: Sufficiently Active (12/22/2023)   Exercise Vital Sign    Days of Exercise per Week: 5 days    Minutes of Exercise per Session: 50 min  Stress: No Stress Concern Present (12/22/2023)   [employer] of Occupational Health - Occupational Stress Questionnaire    Feeling of Stress: Not at all  Social Connections: Moderately Isolated (12/22/2023)   Social Connection and Isolation Panel    Frequency of Communication with Friends and Family: More than three times a week    Frequency of Social Gatherings with Friends and Family: Three times a week    Attends Religious Services: Never    Active Member of Clubs or Organizations: No    Attends Banker Meetings: Never    Marital Status: Married  Catering Manager Violence: Not At Risk (12/22/2023)   Epic    Fear of Current or Ex-Partner: No    Emotionally Abused: No    Physically Abused: No    Sexually Abused: No  Depression (PHQ2-9): Low Risk (05/09/2024)   Depression (PHQ2-9)    PHQ-2 Score: 0  Alcohol Screen: Low Risk (12/22/2023)   Alcohol Screen    Last Alcohol Screening Score (AUDIT): 0  Housing: Unknown (12/22/2023)   Epic    Unable to Pay for Housing in the Last Year: No    Number of Times Moved in the Last Year: Not on file    Homeless in the Last Year: No  Utilities: Not At Risk (12/22/2023)   Epic    Threatened with loss of utilities: No  Health Literacy: Adequate Health Literacy (12/22/2023)   B13[facility] Health  Literacy    Frequency of need for help with medical instructions: Never    Past Surgical History:  Procedure Laterality Date   APPENDECTOMY     AUGMENTATION MAMMAPLASTY Bilateral    BACK SURGERY  2003   Bleeding ulcers  10/2009   Broken Hip Left 06/2001   CATARACT EXTRACTION Bilateral 01/2017   COLONOSCOPY N/A 12/30/2016   Procedure: COLONOSCOPY WITH PROPOFOL ;  Surgeon: Debby Hila, MD;  Location: [facility];  Service: Endoscopy;  Laterality: N/A;   COLONOSCOPY  2011   inadequate prep, polyps   EYE SURGERY     FRACTURE SURGERY  GASTROSTOMY N/A 09/15/2021   Procedure: INSERTION OF GASTROSTOMY TUBE;  Surgeon: Signe Mitzie LABOR, MD;  Location: [facility];  Service: General;  Laterality: N/A;   NECK SURGERY  2004 & 2006   x's 2   POLYPECTOMY     RECTAL PROLAPSE REPAIR  01/2017   SMALL INTESTINE SURGERY     SPINE SURGERY     XI ROBOTIC ASSISTED PARAESOPHAGEAL HERNIA REPAIR N/A 09/15/2021   Procedure: XI ROBOTIC ASSISTED PARAESOPHAGEAL HERNIA REPAIR WITH GASTROPEXY;  Surgeon: Signe Mitzie LABOR, MD;  Location: [facility];  Service: General;  Laterality: N/A;    Family History  Problem Relation Age of Onset   Bone cancer Mother    Anxiety disorder Mother    Cancer Mother    Depression Mother    Hearing loss Father    Anxiety disorder Sister    Anxiety disorder Sister    Hypertension Other    Allergic rhinitis Neg Hx    Asthma Neg Hx    Urticaria Neg Hx    Colon cancer Neg Hx    Colon polyps Neg Hx    Esophageal cancer Neg Hx    Rectal cancer Neg Hx    Stomach cancer Neg Hx    Breast cancer Neg Hx     Allergies[1]  Medications Ordered Prior to Encounter[2]  BP 110/70   Pulse 80   Temp 97.9 F (36.6 C) (Oral)   Ht 4' 11.5 (1.511 m)   Wt 100 lb 4 oz (45.5 kg)   SpO2 99%   BMI 19.91 kg/m  Objective:   Physical Exam HENT:     Right Ear: Tympanic membrane and ear canal normal.     Left Ear: Tympanic membrane and ear canal normal.  Eyes:     Pupils: Pupils are  equal, round, and reactive to light.  Cardiovascular:     Rate and Rhythm: Normal rate and regular rhythm.  Pulmonary:     Effort: Pulmonary effort is normal.     Breath sounds: Normal breath sounds.  Abdominal:     General: Bowel sounds are normal.     Palpations: Abdomen is soft.     Tenderness: There is no abdominal tenderness.  Musculoskeletal:     Cervical back: Neck supple. Pain with movement present. Decreased range of motion.  Skin:    General: Skin is warm and dry.  Neurological:     Mental Status: She is alert and oriented to person, place, and time.     Cranial Nerves: No cranial nerve deficit.     Deep Tendon Reflexes:     Reflex Scores:      Patellar reflexes are 2+ on the right side and 2+ on the left side. Psychiatric:        Mood and Affect: Mood normal.     Physical Exam        Assessment & Plan:  Chronic radicular cervical pain Assessment & Plan: Uncontrolled.  Discussed options for treatment.  It looks like her neurologist previously prescribed Lyrica , she does not recall this.  Start Lyrica  50 mg daily, she will update.  Anticipate the need to titrate upward  Orders: -     Pregabalin ; Take 1 capsule (50 mg total) by mouth daily. For neck pain  Dispense: 90 capsule; Refill: 0  Estrogen deficiency -     DG Bone Density; Future  Screening for colon cancer -     Ambulatory referral to Gastroenterology  Essential hypertension Assessment & Plan: Controlled.  Continue  lisinopril  20 mg daily. CMP pending.  Orders: -     Comprehensive metabolic panel with GFR  Hyperlipidemia, unspecified hyperlipidemia type Assessment & Plan: Repeat lipid panel pending.  Continue rosuvastatin  5 mg daily.  Orders: -     Lipid panel  Prediabetes Assessment & Plan: Repeat A1c pending.  Orders: -     Hemoglobin A1c  Chronic migraine without aura without status migrainosus, not intractable Assessment & Plan: Uncontrolled which has suspected to be  secondary to her chronic back pain.  Continue to follow with physical medicine. Start Lyrica  50 mg daily.  She will update   Mild intermittent asthma without complication Assessment & Plan: No concerns today.     Preventative health care Assessment & Plan: Immunizations UTD.  Mammogram UTD. Bone density scan due, orders placed. Colonoscopy due, referral placed to GI  Discussed the importance of a healthy diet and regular exercise in order for weight loss, and to reduce the risk of further co-morbidity.  Exam stable. Labs pending.  Follow up in 1 year for repeat physical.    Insomnia, unspecified type Assessment & Plan: Following with psychiatry.  Continue trazodone  100 mg at bedtime.   Anxiety and depression Assessment & Plan: Controlled.  Continue Zoloft  200 mg daily and trazodone  100 mg at bedtime Following with psychiatry     Assessment and Plan Assessment & Plan         Comer MARLA Gaskins, NP       [1] No Known Allergies [2]  Current Outpatient Medications on File Prior to Visit  Medication Sig Dispense Refill   CALCIUM  PO Take 1 tablet by mouth daily.      lisinopril  (ZESTRIL ) 20 MG tablet TAKE 1 TABLET BY MOUTH EVERY DAY FOR BLOOD PRESSURE 90 tablet 0   Magnesium  250 MG TABS Take 2 tablets by mouth daily.     Multiple Vitamins-Minerals (MULTIVITAMIN PO) Take 1 tablet by mouth daily.      pantoprazole  (PROTONIX ) 40 MG tablet Take 1 tablet (40 mg total) by mouth 2 (two) times daily. for heartburn. 180 tablet 1   rosuvastatin  (CRESTOR ) 5 MG tablet TAKE 1 TABLET BY MOUTH EVERY DAY FOR CHOLESTEROL 90 tablet 0   sertraline  (ZOLOFT ) 100 MG tablet Take 2 tablets (200 mg total) by mouth daily. 180 tablet 1   traZODone  (DESYREL ) 100 MG tablet Take 1 tablet (100 mg total) by mouth at bedtime. 90 tablet 1   No current facility-administered medications on file prior to visit.   "

## 2024-05-09 NOTE — Assessment & Plan Note (Signed)
 Immunizations UTD.  Mammogram UTD. Bone density scan due, orders placed. Colonoscopy due, referral placed to GI  Discussed the importance of a healthy diet and regular exercise in order for weight loss, and to reduce the risk of further co-morbidity.  Exam stable. Labs pending.  Follow up in 1 year for repeat physical.

## 2024-05-09 NOTE — Assessment & Plan Note (Signed)
 Repeat lipid panel pending. Continue rosuvastatin 5 mg daily.

## 2024-05-09 NOTE — Assessment & Plan Note (Signed)
 Uncontrolled.  Discussed options for treatment.  It looks like her neurologist previously prescribed Lyrica , she does not recall this.  Start Lyrica  50 mg daily, she will update.  Anticipate the need to titrate upward

## 2024-05-09 NOTE — Assessment & Plan Note (Addendum)
 Following with psychiatry.  Continue trazodone  100 mg at bedtime.

## 2024-05-20 ENCOUNTER — Other Ambulatory Visit: Payer: Self-pay | Admitting: Primary Care

## 2024-05-20 DIAGNOSIS — E785 Hyperlipidemia, unspecified: Secondary | ICD-10-CM

## 2024-05-22 ENCOUNTER — Ambulatory Visit: Admitting: Physical Medicine and Rehabilitation

## 2024-05-22 ENCOUNTER — Encounter: Payer: Self-pay | Admitting: Physical Medicine and Rehabilitation

## 2024-05-29 ENCOUNTER — Encounter: Admitting: Physical Medicine and Rehabilitation

## 2024-06-19 ENCOUNTER — Ambulatory Visit: Admitting: Adult Health

## 2024-06-23 ENCOUNTER — Ambulatory Visit: Admitting: Family Medicine

## 2024-07-03 ENCOUNTER — Encounter: Admitting: Physical Medicine and Rehabilitation

## 2024-08-14 ENCOUNTER — Encounter: Attending: Physical Medicine and Rehabilitation | Admitting: Physical Medicine and Rehabilitation

## 2024-12-26 ENCOUNTER — Ambulatory Visit

## 2025-01-18 ENCOUNTER — Other Ambulatory Visit (HOSPITAL_BASED_OUTPATIENT_CLINIC_OR_DEPARTMENT_OTHER)
